# Patient Record
Sex: Female | Born: 1935 | Race: White | Hispanic: No | Smoking: Former smoker
Health system: Southern US, Community
[De-identification: ages and names within clinical notes are randomized; demographics above are authoritative.]

## PROBLEM LIST (undated history)

## (undated) DIAGNOSIS — K623 Rectal prolapse: Secondary | ICD-10-CM

## (undated) DIAGNOSIS — M1990 Unspecified osteoarthritis, unspecified site: Secondary | ICD-10-CM

## (undated) DIAGNOSIS — C7931 Secondary malignant neoplasm of brain: Secondary | ICD-10-CM

## (undated) HISTORY — PX: ABDOMINAL HYSTERECTOMY: SHX81

## (undated) HISTORY — PX: JOINT REPLACEMENT: SHX530

## (undated) HISTORY — PX: REPLACEMENT TOTAL KNEE: SUR1224

## (undated) HISTORY — PX: OTHER SURGICAL HISTORY: SHX169

---

## 1998-09-10 ENCOUNTER — Encounter: Payer: Self-pay | Admitting: *Deleted

## 1998-09-17 ENCOUNTER — Inpatient Hospital Stay (HOSPITAL_COMMUNITY): Admission: RE | Admit: 1998-09-17 | Discharge: 1998-09-21 | Payer: Self-pay | Admitting: *Deleted

## 1998-09-25 ENCOUNTER — Ambulatory Visit (HOSPITAL_COMMUNITY): Admission: RE | Admit: 1998-09-25 | Discharge: 1998-09-25 | Payer: Self-pay | Admitting: *Deleted

## 1998-10-02 ENCOUNTER — Encounter (HOSPITAL_COMMUNITY): Admission: RE | Admit: 1998-10-02 | Discharge: 1998-12-31 | Payer: Self-pay | Admitting: *Deleted

## 1999-02-28 ENCOUNTER — Emergency Department (HOSPITAL_COMMUNITY): Admission: EM | Admit: 1999-02-28 | Discharge: 1999-02-28 | Payer: Self-pay | Admitting: Emergency Medicine

## 2003-09-13 ENCOUNTER — Encounter: Payer: Self-pay | Admitting: Orthopedic Surgery

## 2003-09-18 ENCOUNTER — Inpatient Hospital Stay (HOSPITAL_COMMUNITY): Admission: RE | Admit: 2003-09-18 | Discharge: 2003-09-21 | Payer: Self-pay | Admitting: Orthopedic Surgery

## 2004-12-12 ENCOUNTER — Encounter: Admission: RE | Admit: 2004-12-12 | Discharge: 2004-12-12 | Payer: Self-pay | Admitting: Orthopedic Surgery

## 2004-12-12 IMAGING — NM NM BONE 3 PHASE
6 series · 6 of 6 positions shown · non-contrast
Comparison: none

CLINICAL DATA: Right knee pain.  Evaluate for loosening. 
 THREE PHASE BONE SCAN:
 The patient was injected with 24.3 mCi [I8] MDP intravenously, and three phase bone scan of the knees was performed.  On the flow phase activity is symmetrical.  On soft tissue phase mild activity is noted in both knees surrounding the prostheses but no focal abnormality is seen.  On delayed images there is fairly symmetrical activity, particularly on the tibial components of the prostheses but this is most consistent with normal activity.  No activity is seen surrounding either prosthesis to indicate loosening.

[st statics, dual detec · 2.36mm/px · 1 of 1 slices shown (1 of 3)]
[im 1/1]
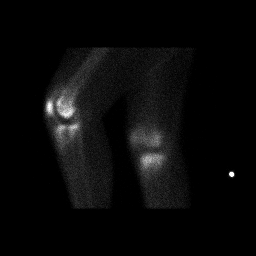

[bf bone flow · 2.33mm/px · 1 of 1 slices shown (1 of 3)]
[im 1/1]
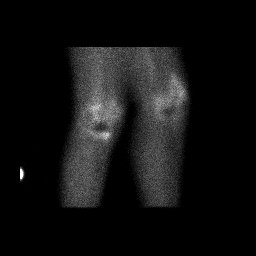

[bf bone flow · 2.36mm/px · 1 of 1 slices shown (2 of 3)]
[im 1/1]
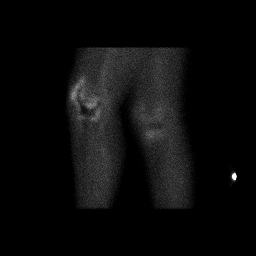

[bf bone flow · 2.36mm/px · 1 of 1 slices shown (3 of 3)]
[im 1/1]
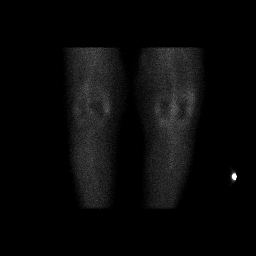

[st statics, dual detec · 2.36mm/px · 1 of 1 slices shown (2 of 3)]
[im 1/1]
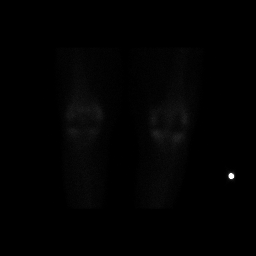

[st statics, dual detec · 2.33mm/px · 1 of 1 slices shown (3 of 3)]
[im 1/1]
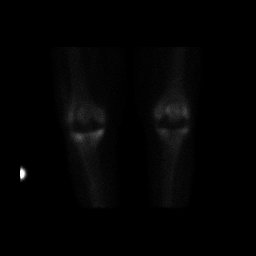

[6 of 6 positions shown; findings below may reference images not displayed]

IMPRESSION: Activity in the knees is within normal limits post bilateral total knee replacement.  There is no evidence of prosthetic loosening.

## 2008-01-26 ENCOUNTER — Encounter: Payer: Self-pay | Admitting: *Deleted

## 2008-01-26 DIAGNOSIS — Z9010 Acquired absence of unspecified breast and nipple: Secondary | ICD-10-CM | POA: Insufficient documentation

## 2008-01-26 DIAGNOSIS — Z9189 Other specified personal risk factors, not elsewhere classified: Secondary | ICD-10-CM | POA: Insufficient documentation

## 2008-01-26 DIAGNOSIS — K644 Residual hemorrhoidal skin tags: Secondary | ICD-10-CM | POA: Insufficient documentation

## 2008-01-26 DIAGNOSIS — C50919 Malignant neoplasm of unspecified site of unspecified female breast: Secondary | ICD-10-CM | POA: Insufficient documentation

## 2008-01-26 DIAGNOSIS — Z9089 Acquired absence of other organs: Secondary | ICD-10-CM

## 2008-01-26 DIAGNOSIS — D126 Benign neoplasm of colon, unspecified: Secondary | ICD-10-CM | POA: Insufficient documentation

## 2008-01-27 ENCOUNTER — Ambulatory Visit: Payer: Self-pay | Admitting: Internal Medicine

## 2011-04-11 NOTE — Discharge Summary (Signed)
NAME:  TRAMEKA, DOROUGH                    ACCOUNT NO.:  [id number]   MEDICAL RECORD NO.:  [id number]                   PATIENT TYPE:  INP   LOCATION:  0464                                 FACILITY:  [facility]   PHYSICIAN:  Ollen Gross, M.D.                 DATE OF BIRTH:  [birth year]   DATE OF ADMISSION:  09/18/2003  DATE OF DISCHARGE:  09/21/2003                                 DISCHARGE SUMMARY   ADMITTING DIAGNOSES:  1. Osteoarthritis left knee.  2. History of ulcers.  3. Non-insulin-dependent diabetes mellitus.  4. Postmenopausal.   DISCHARGE DIAGNOSES:  1. Osteoarthritis left knee status post left total knee arthroplasty.  2. Postoperative blood loss anemia.  3. Postoperative hyponatremia, improved.   PROCEDURE:  The patient was taken to the OR on September 18, 2003.  Left total  knee arthroplasty.  Surgeon Ollen Gross, M.D.  Assistant Alexzandrew L.  Julien Girt, P.A.  Anesthesia general.  Minimal blood loss.  Hemovac drain x1.  Tourniquet time 47 minutes at 300 mmHg.   CONSULTS:  None.   BRIEF HISTORY:  A 77 year old female well known to Ollen Gross, M.D.  having severe end-stage osteoarthritis of the left knee.  Pain has been  refractory to nonoperative management.  Significant deformity in the left  knee.  Presents now for total knee arthroplasty.   LABORATORY DATA:  CBC on admission showed hemoglobin of 14.4, hematocrit of  42.1, white cell count 4.8, red cell count 4.49.  Differential within normal  limits.  Postoperative H&H 11.6 and 33.3.  Last noted H&H 9.6 and 27.2.  PT/PTT on admission were 13.4 and 32, respectively with an INR of 1.0.  Serial pro times followed.  Last noted PT/INR 15.0 and 1.3.  Chemistry panel  on admission all within normal limits.  Serial BMETs were followed.  Sodium  dropped from 141 down to 131 back up to 134.  Glucose went up from 103 to  164, last noted at 175.  Urinalysis on admission:  Small leukocyte esterase,  rare epithelial  cells, 3-6 white cells, few bacteria.  Blood group type O+.  Chest x-ray dated September 13, 2003:  No evidence of acute disease.   HOSPITAL COURSE:  The patient admitted to [facility], taken to OR.  Underwent above stated procedure without complications.  The patient  tolerated procedure well.  Later returned to recovery room, then to  orthopedic floor for continued postoperative care.  Vital signs were  followed.  The patient was given 24 hours of postoperative IV antibiotics in  the form of Ancef.  Placed on Coumadin for DVT prophylaxis.  Placed  weightbearing as tolerated.  Given PC analgesic for pain control following  surgery.  Hemovac drain which was placed at time of surgery was pulled on  postoperative day one.  She had a Marcaine pain pump which was also placed  at time of surgery and was pulled on postoperative day two.  She did get out  of bed the first day and did well with her physical therapy.  She was  ambulating approximately 60 feet by day two and then 80 feet later that  afternoon and then it looks like 250 feet by day three.  She did extremely  well with physical therapy.  Dressing change initiated on postoperative day  two.  Incision was healing well.  PC and IVs were discontinued on day two.  She was doing so well by day three up ambulating 250 feet, moving her  bowels, tolerating her medications.  It was decided that patient would be  discharged home.   DISCHARGE PLAN:  Discharged home on September 21, 2003.   DISCHARGE DIAGNOSES:  Please see above.   DISCHARGE MEDICATIONS:  1. Coumadin.  2. Percocet.  3. Robaxin.   DIET:  Diabetic diet.   FOLLOWUP:  Two weeks.   ACTIVITY:  Weightbearing as tolerated.  Home health PT, home health nursing.  Total knee protocol.   DISPOSITION:  Home.   CONDITION ON DISCHARGE:  Improved.     Alexzandrew L. Julien Girt, P.A.              Ollen Gross, M.D.    ALP/MEDQ  D:  10/27/2003  T:  10/27/2003  Job:   [id number]   cc:   Lacretia Leigh. Quintella Reichert, M.D.

## 2011-04-11 NOTE — H&P (Signed)
NAME:  Peggy Howard, Peggy Howard                    ACCOUNT NO.:  [id number]   MEDICAL RECORD NO.:  [id number]                   PATIENT TYPE:  INP   LOCATION:  0464                                 FACILITY:  [facility]   PHYSICIAN:  Ollen Gross, M.D.                 DATE OF BIRTH:  04/22/1933   DATE OF ADMISSION:  09/18/2003  DATE OF DISCHARGE:  09/21/2003                                HISTORY & PHYSICAL   CHIEF COMPLAINT:  Left knee pain.   HISTORY OF PRESENT ILLNESS:  The patient is a 76 year old female seen by Dr.  Lequita Halt for ongoing left knee pain. She has been seen for knee pain that has  been ongoing for several years now. It has progressively gotten worse. It  hurts her all the time. She has even been having pain at night and it  started to interfere with her daily activities. She is seen in the office.  Her x-rays show significant end stage arthritis of the left knee, multi-  compartmental. It is felt that she has reached a point where she could  benefit from undergoing total knee replacement. Risks and benefits of  procedure have been discussed with the patient. She elected to proceed with  surgery.   ALLERGIES:  No known drug allergies.   CURRENT MEDICATIONS:  None.   PAST MEDICAL HISTORY:  History of ulcers. Newly diagnosed noninsulin-  dependent diabetes mellitus. Post-menopausal.   PAST SURGICAL HISTORY:  Hysterectomy, polypectomy, appendectomy,  tonsillectomy.   SOCIAL HISTORY:  Married. She works as a Animator. Non-smoker and no  alcohol. Four children. Her husband will be assisting with her care after  surgery.   FAMILY HISTORY:  Significant with mother deceased age 20 with history of  myocardial infarction. Father deceased in his 10's. A war casualty in 1932.   REVIEW OF SYSTEMS:  GENERAL:  No fever, chills, or night sweats. NEUROLOGIC:  No seizures, syncope, or paralysis. RESPIRATORY: No shortness of breath,  productive cough, or hemoptysis.  CARDIOVASCULAR:  No chest pain, angina, or  orthopnea. GASTROINTESTINAL:  No nausea, vomiting, diarrhea or constipation.  GENITOURINARY:  No dysuria, hematuria or discharge. MUSCULOSKELETAL:  Pertinent to that of the left knee found in the history of present illness.   PHYSICAL EXAMINATION:  VITAL SIGNS:  Pulse 60, respiratory rate 12, blood  pressure 144/84.  GENERAL:  The patient is a 77 year old white female, well developed, well  nourished and in no acute distress. Alert and oriented and cooperative.  HEENT:  Normocephalic and atraumatic. Pupils are equal, round, and reactive.  Extraocular muscles intact. The patient does have upper dentures and lower  partial dentures.  NECK:  Supple. No carotid bruits.  CHEST:  Left posterior base crackles. Otherwise pulmonary fields are clear.  The crackles do improve with cough.  HEART:  Regular rate and rhythm. No murmurs.  ABDOMEN:  Soft and nontender. Bowel sounds are present.  RECTAL/BREAST/GENITALIA:  Not done.  Not pertinent to present illness.  EXTREMITIES:  Significant to the left knee. She is tender to palpation  throughout the left knee. There is moderate crepitus noted on passive range  of motion. Range of motion of 5 to 105 degrees. No swelling.   IMPRESSION:  1. Osteoarthritis, left knee.  2. History of ulcers.  3. Noninsulin-dependent diabetes mellitus.  4. Post menopausal.   PLAN:  The patient admitted to [facility] to undergo a  left total knee arthroplasty. The surgery will be performed by Dr. Ollen Gross.     Alexzandrew L. Julien Girt, P.A.              Ollen Gross, M.D.    ALP/MEDQ  D:  09/25/2003  T:  09/25/2003  Job:  [id number]   cc:   Lacretia Leigh. Quintella Reichert, M.D.   Ollen Gross, M.D.  [address]  [place]  [place] [address]  Fax: [phone]

## 2011-04-11 NOTE — Op Note (Signed)
NAME:  Peggy Howard, Peggy Howard                    ACCOUNT NO.:  [id number]   MEDICAL RECORD NO.:  [id number]                   PATIENT TYPE:  INP   LOCATION:  X005                                 FACILITY:  [facility]   PHYSICIAN:  Ollen Gross, M.D.                 DATE OF BIRTH:  [birth year]   DATE OF PROCEDURE:  09/18/2003  DATE OF DISCHARGE:                                 OPERATIVE REPORT   PREOPERATIVE DIAGNOSIS:  Osteoarthritis of the left knee.   POSTOPERATIVE DIAGNOSES:  Osteoarthritis of the left knee.   OPERATION/PROCEDURE:  Left total knee arthroplasty.   SURGEON:  Ollen Gross, M.D.   ASSISTANT:  Alexzandrew L. Perkins, P.A.-C.   ANESTHESIA:  General.   ESTIMATED BLOOD LOSS:  Minimal.   DRAINS:  Hemovac x1.   COMPLICATIONS:  None.   TOURNIQUET TIME:  47 minutes at 300 mmHg.   DISPOSITION:  Stable to recovery.   BRIEF CLINICAL NOTE:  Mrs. Peggy Howard is a 77 year old female with severe end-  stage osteoarthritis of the left knee with pain refractory to nonoperative  management.  She has significant deformity of the left knee and presents now  for left total knee arthroplasty.   DESCRIPTION OF PROCEDURE:  After the successful administration of general  anesthesia, tourniquet was placed on the left thigh and left lower extremity  prepped and draped in the usual sterile fashion.  The extremity was wrapped  in Esmarch, knee flexed, tourniquet inflated to 300 mmHg.  Standard midline  incision was made with a 10-blade through the subcutaneous tissue to the  level of the extensor mechanism.  A fresh blade was used to make a medial  parapatellar arthrotomy and the soft tissue and proximal lateral tibia  subperiosteally elevated to the joint line with the knife, then the  semimembranosus with the curved osteotome.  Soft tissue of the proximal and  lateral tibia was also elevated with attention being paid to avoid the  patellar tendon on the tibial tubercle.  Patella was then  everted laterally,  knee flexed and the ACL and PCL removed.  Drill was used to create a  starting hole at the distal femur.  The canal was irrigated.  A five-degree  left valgus alignment guide was placed.  Referencing at both the posterior  condyles, location was marked and the block pins were moved about 10 mm off  the distal femur.  Distal femoral resection was made with an oscillating  saw.  Sizing block was placed and size 3 was the most appropriate.  Rotation  was marked off the epicondylar axis.  Size 3 cutting block is placed and the  anterior and posterior cuts made.   Tibia was subluxed forward and the menisci removed.  Extra medullary tibial  alignment guide placed surfacing proximally to the medial aspect of the  tibial tubercle and distally along the second metatarsal axis and tibial  crest.  Blocks pinned to remove  10 mm of the nondeficient lateral side.  Tibial resection is made with an oscillating saw.  A size 3 is most  appropriate and the proximal tibia is prepared with a modular drill and keel  punch.  Femoral preparation is then completed with the intercondylar and  chamfer cuts.   Trial size 3 posterior stabilizing femur, size 3 mobile bearing tibia and 10  mm posterior stabilizing rotating platform insert trial placed.  Full  extension was achieved with excellent varus and valgus balance throughout.  The patella thickness is measured to be 23 mm, ___________ 13 mm.  A 38  template is placed.  Local holes are drilled, trial patellas are placed and  it tracks normally.  The osteophytes were then removed off the posterior  femur with the trial in place.   All trials were then removed and the cut bone surfaces prepared with  pulsatile lavage.  Cement is mixed and once ready for implantation, the size  3 posterior stabilized femur, size 3 mobile bearing tibia, and 38 patella is  cemented into place.  Patella is held with the clamp.  A 10 mm trial insert  is placed and  knee held in full extension, all extruded cement removed.  Once the cement was fully hardened, and the permanent 10 mm posterior  stabilizing rotating platform insert is placed into the tibial tray.  The  knee is reduced and has excellent stability with excellent balance from full  extension down past 130 degrees of flexion.  The wounds were then copiously  irrigated with antibiotic solution and the extensor mechanism closed over a  Hemovac drain with interrupted #1 PDS.  The tourniquet released for a total  time of 47 minutes.  Flexion is against gravity was 125 degrees.  Subcutaneous tissues were then closed with interrupted 2-0 Vicryl,  subcuticular running 4-0 Monocryl.  The catheter with a Marcaine pain pump  and then a bulky sterile dressing applied.  She was then awakened and  transported to the recovery room in stable condition.                                               Ollen Gross, M.D.    FA/MEDQ  D:  09/18/2003  T:  09/18/2003  Job:  [id number]

## 2017-06-17 ENCOUNTER — Emergency Department (HOSPITAL_COMMUNITY): Payer: Medicare Other

## 2017-06-17 ENCOUNTER — Observation Stay (HOSPITAL_COMMUNITY)
Admission: EM | Admit: 2017-06-17 | Discharge: 2017-06-18 | Disposition: A | Discharge status: Home / Self Care | Payer: Medicare Other | Attending: Family Medicine | Admitting: Family Medicine

## 2017-06-17 ENCOUNTER — Encounter (HOSPITAL_COMMUNITY): Payer: Self-pay | Admitting: Emergency Medicine

## 2017-06-17 DIAGNOSIS — J9601 Acute respiratory failure with hypoxia: Secondary | ICD-10-CM | POA: Diagnosis not present

## 2017-06-17 DIAGNOSIS — Z96653 Presence of artificial knee joint, bilateral: Secondary | ICD-10-CM | POA: Diagnosis not present

## 2017-06-17 DIAGNOSIS — J181 Lobar pneumonia, unspecified organism: Secondary | ICD-10-CM

## 2017-06-17 DIAGNOSIS — J189 Pneumonia, unspecified organism: Principal | ICD-10-CM | POA: Insufficient documentation

## 2017-06-17 DIAGNOSIS — R05 Cough: Secondary | ICD-10-CM | POA: Diagnosis present

## 2017-06-17 DIAGNOSIS — Z9011 Acquired absence of right breast and nipple: Secondary | ICD-10-CM | POA: Diagnosis not present

## 2017-06-17 DIAGNOSIS — Z87891 Personal history of nicotine dependence: Secondary | ICD-10-CM | POA: Insufficient documentation

## 2017-06-17 DIAGNOSIS — R0902 Hypoxemia: Secondary | ICD-10-CM

## 2017-06-17 LAB — BASIC METABOLIC PANEL
Anion gap: 10 (ref 5–15)
BUN: 13 mg/dL (ref 6–20)
CALCIUM: 8.8 mg/dL — AB (ref 8.9–10.3)
CO2: 29 mmol/L (ref 22–32)
CREATININE: 0.79 mg/dL (ref 0.44–1.00)
Chloride: 99 mmol/L — ABNORMAL LOW (ref 101–111)
Glucose, Bld: 202 mg/dL — ABNORMAL HIGH (ref 65–99)
Potassium: 4 mmol/L (ref 3.5–5.1)
SODIUM: 138 mmol/L (ref 135–145)

## 2017-06-17 LAB — CBC WITH DIFFERENTIAL/PLATELET
BASOS ABS: 0 10*3/uL (ref 0.0–0.1)
Basophils Relative: 0 %
EOS ABS: 0.1 10*3/uL (ref 0.0–0.7)
Eosinophils Relative: 1 %
HCT: 40.3 % (ref 36.0–46.0)
HEMOGLOBIN: 13.5 g/dL (ref 12.0–15.0)
LYMPHS ABS: 2.6 10*3/uL (ref 0.7–4.0)
LYMPHS PCT: 27 %
MCH: 32.1 pg (ref 26.0–34.0)
MCHC: 33.5 g/dL (ref 30.0–36.0)
MCV: 96 fL (ref 78.0–100.0)
MONO ABS: 0.8 10*3/uL (ref 0.1–1.0)
Monocytes Relative: 8 %
Neutro Abs: 6.3 10*3/uL (ref 1.7–7.7)
Neutrophils Relative %: 64 %
PLATELETS: 370 10*3/uL (ref 150–400)
RBC: 4.2 MIL/uL (ref 3.87–5.11)
RDW: 13.2 % (ref 11.5–15.5)
WBC: 9.8 10*3/uL (ref 4.0–10.5)

## 2017-06-17 LAB — CG4 I-STAT (LACTIC ACID): LACTIC ACID, VENOUS: 1.88 mmol/L (ref 0.5–1.9)

## 2017-06-17 IMAGING — CR DG CHEST 2V
2 series · 2 of 2 positions shown · non-contrast
Comparison: None.

CLINICAL DATA: Productive cough.

EXAM:
CHEST  2 VIEW

[w chest pa]
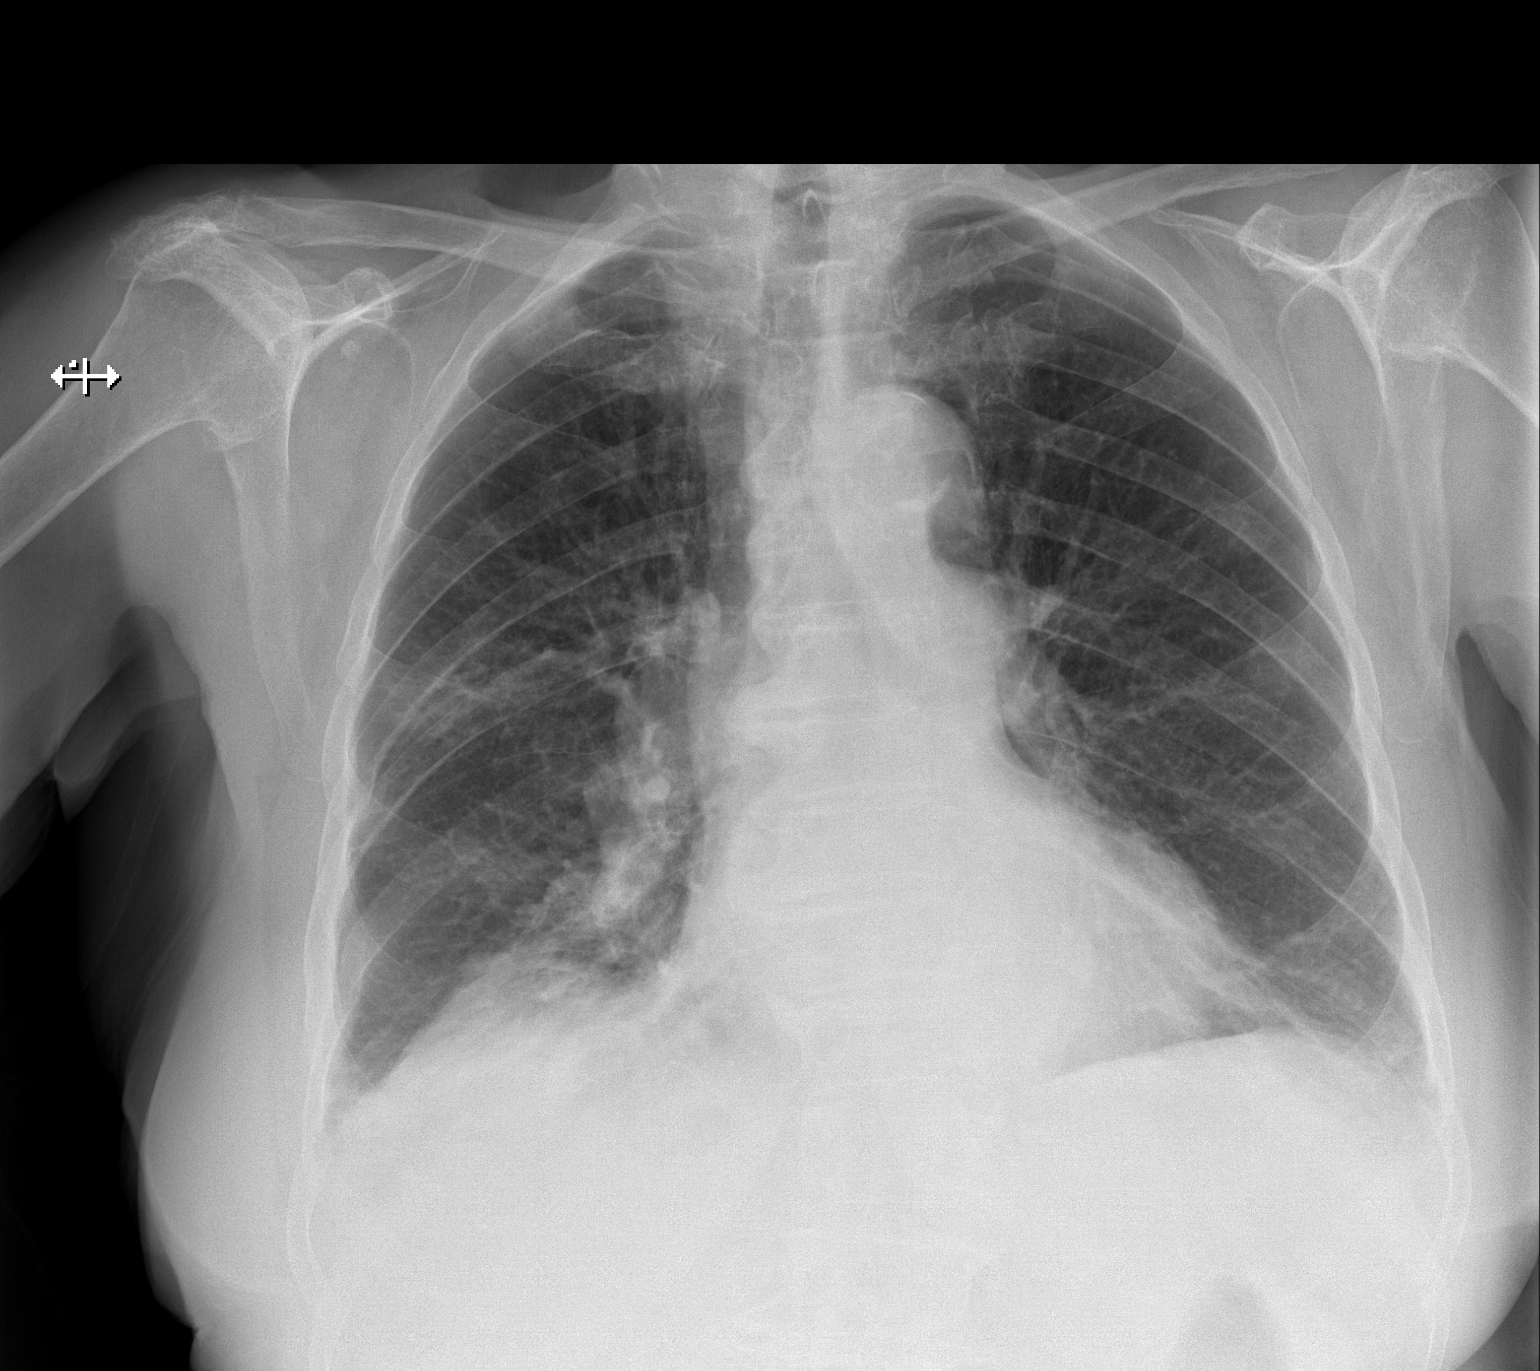

[w chest lat]
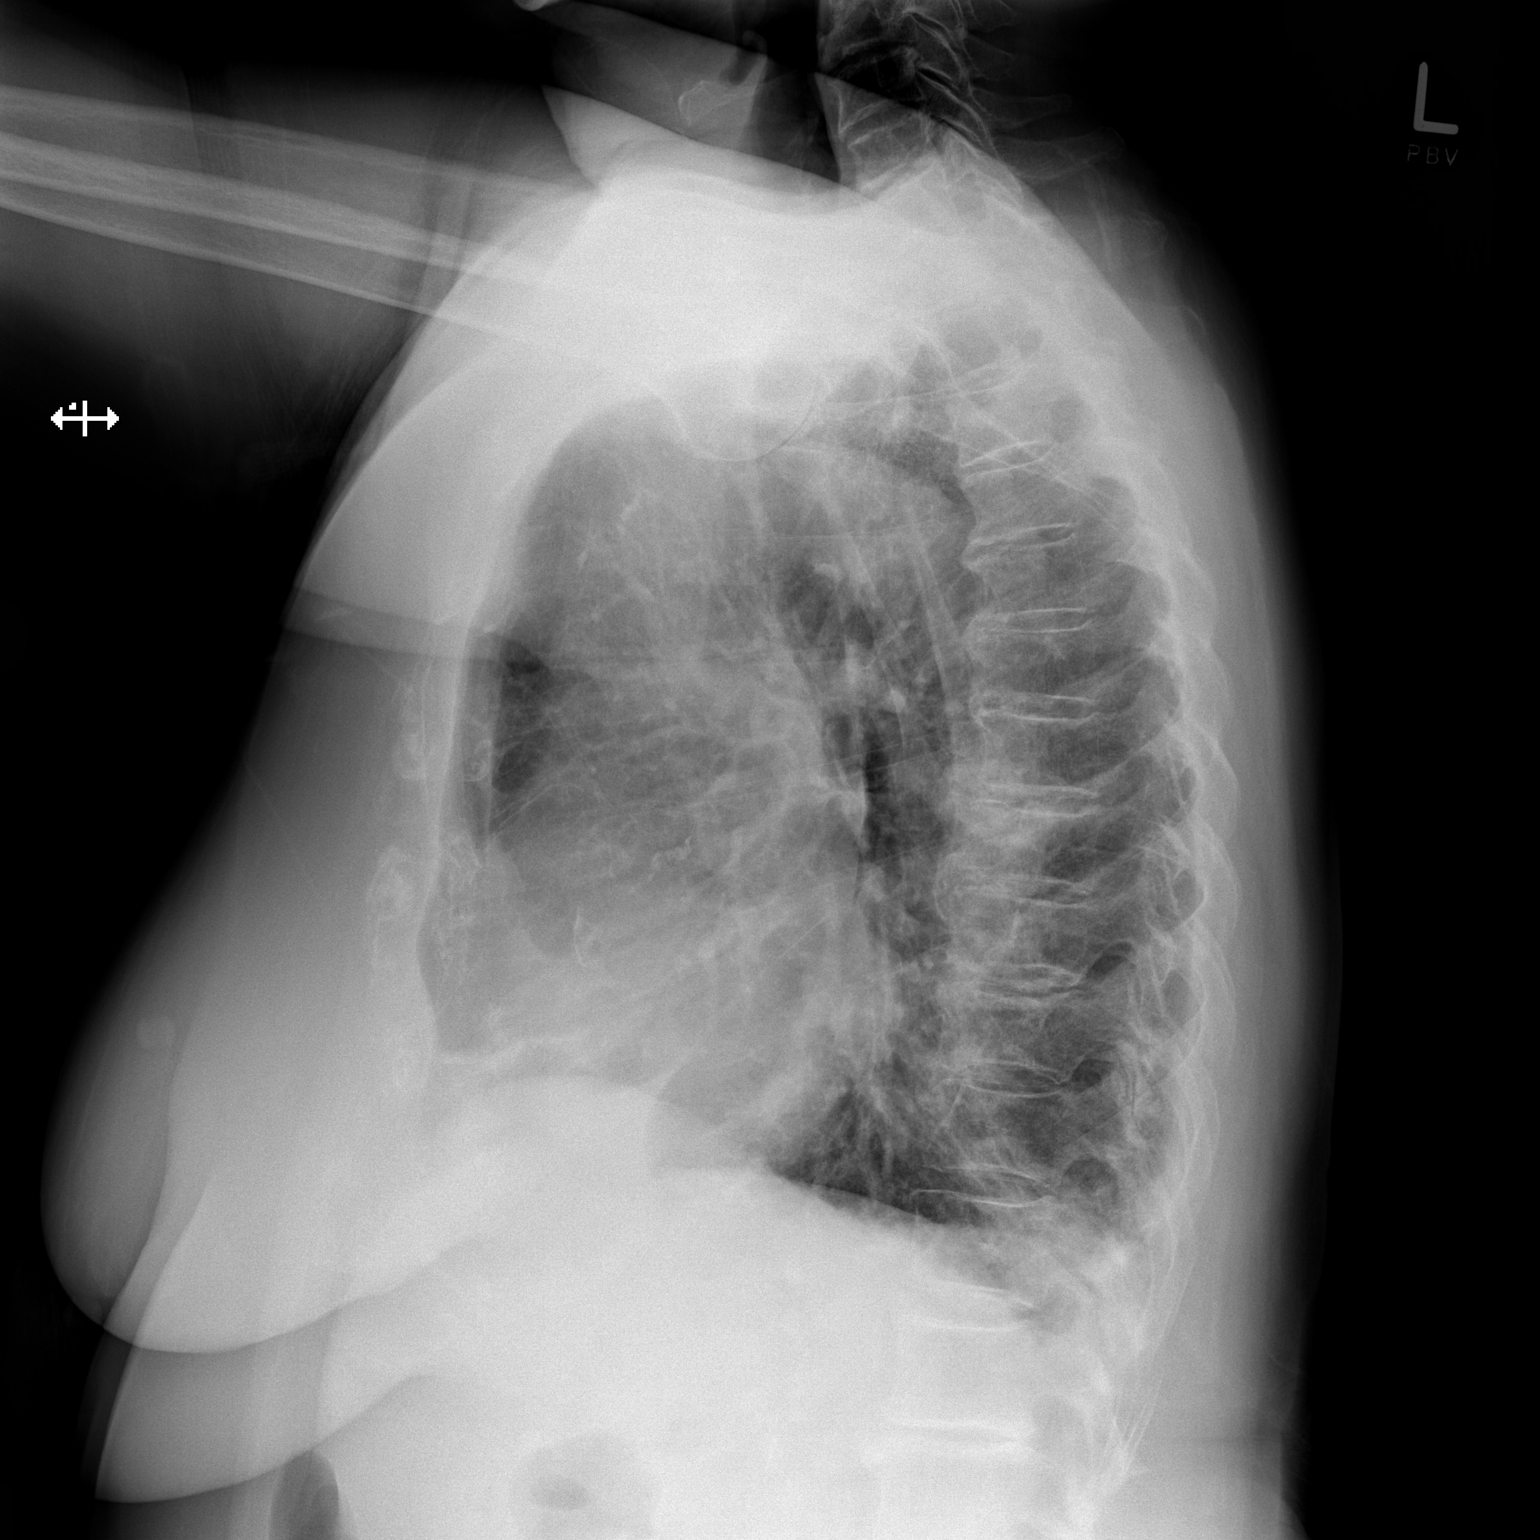

[2 of 2 positions shown; findings below may reference images not displayed]

FINDINGS: The heart size and mediastinal contours are within normal limits.
Atherosclerosis of thoracic aorta is noted. No pneumothorax or
pleural effusion is noted. Mild bilateral basilar opacities are
noted concerning for atelectasis or possibly infiltrates. Right
midlung opacity is noted concerning for possible pneumonia. The
visualized skeletal structures are unremarkable.
IMPRESSION: Possible right midlung pneumonia. Mild bilateral basilar opacities
concerning for atelectasis or possibly infiltrates. Followup PA and
lateral chest X-ray is recommended in 3-4 weeks following trial of
antibiotic therapy to ensure resolution and exclude underlying
malignancy.

## 2017-06-17 MED ORDER — AZITHROMYCIN 500 MG IV SOLR
500.0000 mg | Freq: Once | INTRAVENOUS | Status: AC
  Administered 2017-06-18: 500 mg via INTRAVENOUS
  Filled 2017-06-17: qty 500

## 2017-06-17 MED ORDER — DEXTROSE 5 % IV SOLN
1.0000 g | Freq: Once | INTRAVENOUS | Status: AC
  Administered 2017-06-17: 1 g via INTRAVENOUS
  Filled 2017-06-17: qty 10

## 2017-06-17 NOTE — ED Provider Notes (Signed)
[facility] DEPT Provider Note   CSN: [id number] Arrival date & time: 06/17/17  1613     History   Chief Complaint Chief Complaint  Patient presents with  . Cough    HPI Kynzie Alinger Hada is a 83 y.o. female.  The history is provided by the patient and medical records.  Cough  This is a new problem. The current episode started more than 2 days ago. The problem occurs constantly. The problem has not changed since onset.The cough is productive of sputum. There has been no fever. Associated symptoms include shortness of breath. Pertinent negatives include no chest pain, no chills, no headaches, no rhinorrhea, no sore throat, no myalgias and no wheezing. She has tried nothing for the symptoms. She is not a smoker.    History reviewed. No pertinent past medical history.  Patient Active Problem List   Diagnosis Date Noted  . ADENOCARCINOMA, BREAST, RIGHT 01/26/2008  . POLYP, COLON 01/26/2008  . HEMORRHOIDS, HX OF 01/26/2008  . POLYPECTOMY, HX OF 01/26/2008  . MASTECTOMY, RIGHT, HX OF 01/26/2008  . 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 REPLACEMENT TOTAL KNEE Bilateral     OB History    No data available       Home Medications    Prior to Admission medications   Not on File    Family History History reviewed. No pertinent family history.  Social History Social History  Substance Use Topics  . Smoking status: Not on file  . Smokeless tobacco: Not on file  . Alcohol use Not on file     Allergies   Sulfamethoxazole-trimethoprim   Review of Systems Review of Systems  Constitutional: Negative for chills, fatigue and fever.  HENT: Negative for congestion, rhinorrhea and sore throat.   Eyes: Negative for visual disturbance.  Respiratory: Positive for cough and shortness of breath. Negative for chest tightness, wheezing and stridor.   Cardiovascular: Negative for chest pain, palpitations and  leg swelling.  Gastrointestinal: Negative for abdominal distention, constipation, diarrhea, nausea and vomiting.  Genitourinary: Negative for dysuria, flank pain, frequency and hematuria.  Musculoskeletal: Negative for back pain, myalgias, neck pain and neck stiffness.  Skin: Negative for rash and wound.  Neurological: Negative for seizures, light-headedness and headaches.  Psychiatric/Behavioral: Negative for agitation and confusion.  All other systems reviewed and are negative.    Physical Exam Updated Vital Signs BP (!) 166/80 (BP Location: Left Arm)   Pulse 98   Temp 97.8 F (36.6 C) (Oral)   Resp 18   Ht 5\' 5"  (1.651 m)   Wt 70.9 kg (156 lb 6.4 oz)   SpO2 90%   BMI 26.03 kg/m   Physical Exam  Constitutional: She appears well-developed and well-nourished. No distress.  HENT:  Head: Normocephalic and atraumatic.  Mouth/Throat: Oropharynx is clear and moist. No oropharyngeal exudate.  Eyes: Pupils are equal, round, and reactive to light. Conjunctivae are normal.  Neck: Normal range of motion. Neck supple.  Cardiovascular: Normal rate and regular rhythm.   No murmur heard. Pulmonary/Chest: No stridor. No respiratory distress. She has no wheezes. She has rhonchi in the right upper field, the right middle field and the right lower field. She exhibits no tenderness.  Abdominal: Soft. There is no tenderness.  Musculoskeletal: She exhibits no edema.  Neurological: She is alert. No sensory deficit. She exhibits normal muscle tone.  Skin: Skin is warm and dry. Capillary refill takes less than 2 seconds.  No rash noted. She is not diaphoretic.  Psychiatric: She has a normal mood and affect.  Nursing note and vitals reviewed.    ED Treatments / Results  Labs (all labs ordered are listed, but only abnormal results are displayed) Labs Reviewed  BASIC METABOLIC PANEL - Abnormal; Notable for the following:       Result Value   Chloride 99 (*)    Glucose, Bld 202 (*)    Calcium  8.8 (*)    All other components within normal limits  CULTURE, BLOOD (ROUTINE X 2)  CULTURE, BLOOD (ROUTINE X 2)  CULTURE, EXPECTORATED SPUTUM-ASSESSMENT  GRAM STAIN  CBC WITH DIFFERENTIAL/PLATELET  HIV ANTIBODY (ROUTINE TESTING)  STREP PNEUMONIAE URINARY ANTIGEN  LEGIONELLA PNEUMOPHILA SEROGP 1 UR AG  COMPREHENSIVE METABOLIC PANEL  CBC WITH DIFFERENTIAL/PLATELET  I-STAT CG4 LACTIC ACID, ED  CG4 I-STAT (LACTIC ACID)    EKG  EKG Interpretation None       Radiology Dg Chest 2 View  Result Date: 06/17/2017 CLINICAL DATA:  Productive cough. EXAM: CHEST  2 VIEW COMPARISON:  None. FINDINGS: The heart size and mediastinal contours are within normal limits. Atherosclerosis of thoracic aorta is noted. No pneumothorax or pleural effusion is noted. Mild bilateral basilar opacities are noted concerning for atelectasis or possibly infiltrates. Right midlung opacity is noted concerning for possible pneumonia. The visualized skeletal structures are unremarkable. IMPRESSION: Possible right midlung pneumonia. Mild bilateral basilar opacities concerning for atelectasis or possibly infiltrates. Followup PA and lateral chest X-ray is recommended in 3-4 weeks following trial of antibiotic therapy to ensure resolution and exclude underlying malignancy. Electronically Signed   By: Marijo Conception, M.D.   On: 06/17/2017 17:24    Procedures Procedures (including critical care time)  Medications Ordered in ED Medications  cefTRIAXone (ROCEPHIN) 1 g in dextrose 5 % 50 mL IVPB (not administered)  azithromycin (ZITHROMAX) 500 mg in dextrose 5 % 250 mL IVPB (not administered)     Initial Impression / Assessment and Plan / ED Course  I have reviewed the triage vital signs and the nursing notes.  Pertinent labs & imaging results that were available during my care of the patient were reviewed by me and considered in my medical decision making (see chart for details).     Sweetie Alinger Benda is a 83  y.o. female with no significant past medical history on no home medications who presents with 1 week of productive cough. Patient denies fevers, chills, chest pain, palpitations, lightheadedness, nausea, vomiting, conservation, diarrhea, or dysuria. She denies recent traumas. She does report some mild shortness of breath with her coughing fits. She reports some bilateral flank pain when she is coughing heavily. She denies hemoptysis. She denies leg swelling or pain. She denies history of DVT or PE. She denies any cardiac history.  On arrival, patient found to be hypoxic in the 80s. Patient was placed on 2 L nasal cannula with improvement in oxygen saturations.   On exam, patient has coarse breath sounds on the right side greater than left. Patient's chest and back were nontender. No abdominal tenderness. No focal neurologic deficits.  Patient had x-ray showing concern for pneumonia. Laboratory testing was performed showing nonelevated lactic acid. No leukocytosis and normal creatinine present.   Given the evidence of kidney associated pneumonia, blood cultures were obtained and antibiotics were ordered. Rocephin and azithromycin were given.  Patient wanted to be discharged tonight and was observed for a period of time to see if hypoxia improved. Patient was taken  off of oxygen and reassessed. While sitting comfortably in her bed, patient continued to have oxygen saturation of 82 witnessed by me. Patient continued to have coughing fits.   Given the patient's hypoxia, do not feel patient is safe for discharge home. Patient will be called for admission to the hospitalist team for Community associated pneumonia with hypoxia.   Final Clinical Impressions(s) / ED Diagnoses   Final diagnoses:  Community acquired pneumonia of right middle lobe of lung ([place])  Hypoxia     Clinical Impression: 1. Community acquired pneumonia of right middle lobe of lung ([place])   2. Hypoxia     Disposition: Admit to  hospitalist service    Adelheid Hoggard, Gwenyth Allegra, MD 06/18/17 1150

## 2017-06-17 NOTE — ED Triage Notes (Signed)
Pt states that she has had a productive cough with gray sputum x 1 week. Alert and oriented.

## 2017-06-18 ENCOUNTER — Encounter (HOSPITAL_COMMUNITY): Payer: Self-pay

## 2017-06-18 DIAGNOSIS — J189 Pneumonia, unspecified organism: Secondary | ICD-10-CM | POA: Diagnosis not present

## 2017-06-18 DIAGNOSIS — J9601 Acute respiratory failure with hypoxia: Secondary | ICD-10-CM

## 2017-06-18 DIAGNOSIS — R0902 Hypoxemia: Secondary | ICD-10-CM | POA: Diagnosis not present

## 2017-06-18 DIAGNOSIS — J181 Lobar pneumonia, unspecified organism: Secondary | ICD-10-CM | POA: Diagnosis not present

## 2017-06-18 LAB — CBC WITH DIFFERENTIAL/PLATELET
Basophils Absolute: 0.1 10*3/uL (ref 0.0–0.1)
Basophils Relative: 1 %
Eosinophils Absolute: 0.2 10*3/uL (ref 0.0–0.7)
Eosinophils Relative: 2 %
HCT: 37.2 % (ref 36.0–46.0)
Hemoglobin: 12.7 g/dL (ref 12.0–15.0)
Lymphocytes Relative: 22 %
Lymphs Abs: 2.2 10*3/uL (ref 0.7–4.0)
MCH: 32.2 pg (ref 26.0–34.0)
MCHC: 34.1 g/dL (ref 30.0–36.0)
MCV: 94.2 fL (ref 78.0–100.0)
Monocytes Absolute: 0.9 10*3/uL (ref 0.1–1.0)
Monocytes Relative: 9 %
Neutro Abs: 6.7 10*3/uL (ref 1.7–7.7)
Neutrophils Relative %: 66 %
Platelets: 317 10*3/uL (ref 150–400)
RBC: 3.95 MIL/uL (ref 3.87–5.11)
RDW: 13 % (ref 11.5–15.5)
WBC: 10.1 10*3/uL (ref 4.0–10.5)

## 2017-06-18 LAB — COMPREHENSIVE METABOLIC PANEL
ALT: 47 U/L (ref 14–54)
AST: 43 U/L — AB (ref 15–41)
Albumin: 2.7 g/dL — ABNORMAL LOW (ref 3.5–5.0)
Alkaline Phosphatase: 60 U/L (ref 38–126)
Anion gap: 7 (ref 5–15)
BILIRUBIN TOTAL: 0.6 mg/dL (ref 0.3–1.2)
BUN: 11 mg/dL (ref 6–20)
CALCIUM: 8.6 mg/dL — AB (ref 8.9–10.3)
CO2: 31 mmol/L (ref 22–32)
CREATININE: 0.7 mg/dL (ref 0.44–1.00)
Chloride: 102 mmol/L (ref 101–111)
GFR calc Af Amer: >60 mL/min (ref >60)
GFR calc non Af Amer: >60 mL/min (ref >60)
GLUCOSE: 131 mg/dL — AB (ref 65–99)
Potassium: 4.4 mmol/L (ref 3.5–5.1)
Sodium: 140 mmol/L (ref 135–145)
TOTAL PROTEIN: 6.3 g/dL — AB (ref 6.5–8.1)

## 2017-06-18 LAB — HIV ANTIBODY (ROUTINE TESTING W REFLEX): HIV SCREEN 4TH GENERATION: NONREACTIVE

## 2017-06-18 LAB — CG4 I-STAT (LACTIC ACID): Lactic Acid, Venous: 1.21 mmol/L (ref 0.5–1.9)

## 2017-06-18 MED ORDER — DM-GUAIFENESIN ER 30-600 MG PO TB12: 1.0000 | ORAL_TABLET | Freq: Two times a day (BID) | ORAL | 0 refills | Status: DC

## 2017-06-18 MED ORDER — ENOXAPARIN SODIUM 40 MG/0.4ML ~~LOC~~ SOLN
40.0000 mg | Freq: Every day | SUBCUTANEOUS | Status: DC
  Administered 2017-06-18: 40 mg via SUBCUTANEOUS
  Filled 2017-06-18: qty 0.4

## 2017-06-18 MED ORDER — DM-GUAIFENESIN ER 30-600 MG PO TB12
1.0000 | ORAL_TABLET | Freq: Two times a day (BID) | ORAL | Status: DC
  Administered 2017-06-18: 1 via ORAL
  Filled 2017-06-18: qty 1

## 2017-06-18 MED ORDER — LEVOFLOXACIN 500 MG PO TABS: 500.0000 mg | ORAL_TABLET | Freq: Every day | ORAL | 0 refills | Status: AC

## 2017-06-18 MED ORDER — BENZONATATE 100 MG PO CAPS
100.0000 mg | ORAL_CAPSULE | Freq: Three times a day (TID) | ORAL | Status: DC | PRN
  Administered 2017-06-18: 100 mg via ORAL
  Filled 2017-06-18: qty 1

## 2017-06-18 MED ORDER — AZITHROMYCIN 250 MG PO TABS: 500.0000 mg | ORAL_TABLET | Freq: Every day | ORAL | Status: DC

## 2017-06-18 MED ORDER — BENZONATATE 100 MG PO CAPS: 100.0000 mg | ORAL_CAPSULE | Freq: Three times a day (TID) | ORAL | 0 refills | Status: DC | PRN

## 2017-06-18 MED ORDER — DEXTROSE 5 % IV SOLN: 1.0000 g | INTRAVENOUS | Status: DC

## 2017-06-18 NOTE — Progress Notes (Signed)
Date: June 18, 2017 Chart reviewed for discharge orders: Information concerning the health and wellness clinic given to patient. Vernia Buff, [phone]

## 2017-06-18 NOTE — Progress Notes (Signed)
Md notified that pt walked in hall 360 ft with NT. O2 sats monitored and maintained at 92-93% on RA while ambulating for duration of walk. Pt tolerated well. See new orders entered into EPIC by MD.

## 2017-06-18 NOTE — Progress Notes (Signed)
Pharmacy Antibiotic Note  Peggy Howard is a 83 y.o. female admitted on 06/17/2017 with CAP.  Pharmacy has been consulted for Rocephin dosing.  Plan: Rocephin 1 gm IV q24 Pharmacy to sign off thanks  Height: 5\' 5"  (165.1 cm) Weight: 156 lb 6.4 oz (70.9 kg) IBW/kg (Calculated) : 57  Temp (24hrs), Avg:97.8 F (36.6 C), Min:97.8 F (36.6 C), Max:97.8 F (36.6 C)   Recent Labs Lab 06/17/17 2230 06/17/17 2239  WBC 9.8  --   CREATININE 0.79  --   LATICACIDVEN  --  1.88    Estimated Creatinine Clearance: 54.5 mL/min (by C-G formula based on SCr of 0.79 mg/dL).    Allergies  Allergen Reactions  . Sulfamethoxazole-Trimethoprim     REACTION: causes nausea sweats   Eudelia Bunch, Pharm.D. [phone] 06/18/2017 12:36 AM

## 2017-06-18 NOTE — Progress Notes (Signed)
PROGRESS NOTE  Subjective: Peggy Howard is an 83 y.o. female with no PCP or known medical history who presented to the ED for 1 week of worsening productive cough found to have RML pneumonia on CXR and hypoxemia. She has ~48 pack-year smoking history remotely, quit 40 years ago. She was 83% SpO2 on room air in the ED prompting admission early this morning.   Objective: BP (!) 148/85 (BP Location: Right Arm)   Pulse (!) 103   Temp 98.6 F (37 C) (Oral)   Resp 18   Ht 5\' 5"  (1.651 m)   Wt 80.3 kg (177 lb 1.6 oz)   SpO2 97%   BMI 29.47 kg/m   Gen: No distress.  Pulm: Decreased at bilateral bases, labored with longer sentences. No wheezing or rales.   CV: Regular tachycardia GI: Soft, NT, ND, +BS  Neuro: Alert and oriented. No focal deficits. Skin: No rashes, lesions no ulcers  Assessment & Plan: 83yo female with significant tobacco use history not on any medications admitted early this AM for acute hypoxemic respiratory failure due to RML pneumonia.  - Continue supplemental oxygen as needed to maintain SpO2 >90%. Respiratory status not currently at baseline.  - Continue empiric ceftriaxone, azithromycin - Follow cultures and Ag's - CM consulted to assist with establishing care with PCP near her home in [place] (lives with Son).   Vance Gather, MD [facility] Pager [phone] 06/18/2017, 9:39 AM

## 2017-06-18 NOTE — Discharge Summary (Signed)
Physician Discharge Summary  Peggy Howard HFW:[id number] DOB: [birth year] DOA: 06/17/2017  PCP: Neena Rhymes, MD  Admit date: 06/17/2017 Discharge date: 06/18/2017  Admitted From: Home Disposition: Home   Recommendations for Outpatient Follow-up:  1. Follow up with PCP in 1-2 weeks 2. Followup 2v CXR recommended in 4 weeks to ensure resolution of RML infiltrate/exclude underlying malignancy.  Home Health: None Equipment/Devices: None Discharge Condition: Stable CODE STATUS: Full Diet recommendation: As tolerated  Brief/Interim Summary: Pt is a 83 yo female with no significant pmh who came with cc of cough for the past week, productive of sputum w/o blood with no chest pain, fever/chills, N/V/D/C/abd pain or dysuria. No other complaints. Pt wanted to go home but O2 sat was 83 on RA. No preceding URI symptoms. No asthma. Hx of smoking for 20+ years over 30 years ago. She was given antibiotics with rapid improvement, weaned from oxygen the following morning, taking good po and discharged on oral antibiotics.   Discharge Diagnoses:  Active Problems:   Pneumonia  Acute hypoxic respiratory failure due to RML pneumonia:  - Resolved with antibiotic therapy.  - Continue po antibiotics - Cultures negative at discharge.  Discharge Instructions Discharge Instructions    Discharge instructions    Complete by:  As directed    You were admitted for observation due to an oxygen requirement with pneumonia. Fortunately, it seems you have improved with antibiotics, which you will continue to take. You are stable for discharge with the following recommendations:  - Continue taking levaquin once daily for 5 more days (start this tomorrow, 7/27) - You may also take mucinex and tessalon as needed for cough. Lozenges and 1 tbsp of honey are also helpful for cough.  - The cough should continue for several more days - weeks, though will eventually resolve. If your symptoms get WORSE instead of  better, or worse after initially improving, or you notice a fever or trouble breathing, seek medical care right away.  - Otherwise, you will need to establish care with a PCP as soon as possible for ongoing medical care.     Allergies as of 06/18/2017      Reactions   Sulfamethoxazole-trimethoprim    REACTION: causes nausea sweats      Medication List    TAKE these medications   benzonatate 100 MG capsule Commonly known as:  TESSALON Take 1 capsule (100 mg total) by mouth 3 (three) times daily as needed for cough.   dextromethorphan-guaiFENesin 30-600 MG 12hr tablet Commonly known as:  MUCINEX DM Take 1 tablet by mouth 2 (two) times daily.   levofloxacin 500 MG tablet Commonly known as:  LEVAQUIN Take 1 tablet (500 mg total) by mouth daily.      Follow-up Information    Norins, Heinz Knuckles, MD. Schedule an appointment as soon as possible for a visit.   Specialty:  Internal Medicine Contact information: [place] [address] [phone]          Allergies  Allergen Reactions  . Sulfamethoxazole-Trimethoprim     REACTION: causes nausea sweats    Consultations:  None  Procedures/Studies: Dg Chest 2 View  Result Date: 06/17/2017 CLINICAL DATA:  Productive cough. EXAM: CHEST  2 VIEW COMPARISON:  None. FINDINGS: The heart size and mediastinal contours are within normal limits. Atherosclerosis of thoracic aorta is noted. No pneumothorax or pleural effusion is noted. Mild bilateral basilar opacities are noted concerning for atelectasis or possibly infiltrates. Right midlung opacity is noted concerning  for possible pneumonia. The visualized skeletal structures are unremarkable. IMPRESSION: Possible right midlung pneumonia. Mild bilateral basilar opacities concerning for atelectasis or possibly infiltrates. Followup PA and lateral chest X-ray is recommended in 3-4 weeks following trial of antibiotic therapy to ensure resolution and exclude underlying malignancy.  Electronically Signed   By: Marijo Conception, M.D.   On: 06/17/2017 17:24   Subjective: Feels better than yesterday since antibiotics. Eating, Walked full lap around ward this morning never dipping below 91%, no dyspnea fever or chest pain.   Discharge Exam: Vitals:   06/18/17 0225 06/18/17 0614  BP: (!) 144/91 (!) 148/85  Pulse: 80 (!) 103  Resp: 18 18  Temp: 98.2 F (36.8 C) 98.6 F (37 C)   General: Pt is alert, awake, not in acute distress Cardiovascular: RRR, S1/S2 +, no rubs, no gallops Respiratory: CTA bilaterally, no wheezing, no rhonchi Abdominal: Soft, NT, ND, bowel sounds + Extremities: No edema, no cyanosis  Labs: Basic Metabolic Panel:  Recent Labs Lab 06/17/17 2230 06/18/17 0523  NA 138 140  K 4.0 4.4  CL 99* 102  CO2 29 31  GLUCOSE 202* 131*  BUN 13 11  CREATININE 0.79 0.70  CALCIUM 8.8* 8.6*   Liver Function Tests:  Recent Labs Lab 06/18/17 0523  AST 43*  ALT 47  ALKPHOS 60  BILITOT 0.6  PROT 6.3*  ALBUMIN 2.7*   CBC:  Recent Labs Lab 06/17/17 2230 06/18/17 0523  WBC 9.8 10.1  NEUTROABS 6.3 6.7  HGB 13.5 12.7  HCT 40.3 37.2  MCV 96.0 94.2  PLT 370 317   Time coordinating discharge: Approximately 40 minutes  Vance Gather, MD  [facility] 06/18/2017, 11:00 AM Pager [phone]

## 2017-06-18 NOTE — Care Management Note (Signed)
Case Management Note  Patient Details  Name: Rakhi Romagnoli MRN: [id number] Date of Birth: Aug 09, 1933  Subjective/Objective:      pna              Action/Plan: Date:  June 18 2017  Chart reviewed for concurrent status and case management needs.  Will continue to follow patient progress.  Discharge Planning: following for needs  Expected discharge date: [id number]  Velva Harman, BSN, [place], [place]   Expected Discharge Date:                  Expected Discharge Plan:  Home/Self Care  In-House Referral:     Discharge planning Services  CM Consult  Post Acute Care Choice:    Choice offered to:     DME Arranged:    DME Agency:     HH Arranged:    HH Agency:     Status of Service:  In process, will continue to follow  If discussed at Long Length of Stay Meetings, dates discussed:    Additional Comments:  Leeroy Cha, RN 06/18/2017, 9:41 AM

## 2017-06-18 NOTE — H&P (Signed)
History and Physical  Adonia Porada OIN:[id number] DOB: [birth year] DOA: 06/17/2017  PCP:  Neena Rhymes, MD   Chief Complaint:  Cough  History of Present Illness:  Pt is a 83 yo female with no significant pmh who came with cc of cough for the past week, productive of sputum w/o blood with no chest pain, fever/chills, N/V/D/C/abd pain or dysuria. No other complaints. Pt wanted to go home but O2 sat was 83 on RA. No preceding URI symptoms. No asthma. Hx of smoking for 20+ years over 30 years ago.   Review of Systems:  CONSTITUTIONAL:     No night sweats.  No fatigue.  No fever. No chills. Eyes:                            No visual changes.  No eye pain.  No eye discharge.   ENT:                              No epistaxis.  No sinus pain.  No sore throat.   No congestion. RESPIRATORY:           +cough.  No wheeze.  No hemoptysis.  No dyspnea CARDIOVASCULAR   :  No chest pains.  No palpitations. GASTROINTESTINAL:  No abdominal pain.  No nausea. No vomiting.  No diarrhea. No   constipation.  No hematemesis.  No hematochezia.  No melena. GENITOURINARY:      No urgency.  No frequency.  No dysuria.  No hematuria.  No     obstructive symptoms.  No discharge.  No pain.   MUSCULOSKELETAL:  No musculoskeletal pain.  No joint swelling.  No arthritis. NEUROLOGICAL:        No confusion.  No weakness. No headache. No seizure. PSYCHIATRIC:             No depression. No anxiety. No suicidal ideation. SKIN:                             No rashes.  No lesions.  No wounds. ENDOCRINE:                No weight loss.  No polydipsia.  No polyuria.  No polyphagia. HEMATOLOGIC:           No purpura.  No petechiae.  No bleeding.  ALLERGIC                 : No pruritus.  No angioedema Other:  Past Medical and Surgical History:   History reviewed. No pertinent past medical history. Past Surgical History:  Procedure Laterality Date  . REPLACEMENT TOTAL KNEE Bilateral     Social History:   has no tobacco, alcohol, and drug history on file. Smoked for 20+ years over 30 years ago   Allergies  Allergen Reactions  . Sulfamethoxazole-Trimethoprim     REACTION: causes nausea sweats    History reviewed. No pertinent family history.   Prior to Admission medications   Not on File    Physical Exam: BP (!) 143/77 (BP Location: Left Arm)   Pulse 75   Temp 97.8 F (36.6 C) (Oral)   Resp (!) 24   Ht 5\' 5"  (1.651 m)   Wt 70.9 kg (156 lb 6.4 oz)   SpO2 98%   BMI 26.03 kg/m   GENERAL :  Alert and cooperative, and appears to be in no acute distress. HEAD:           normocephalic. EYES:            PERRL, EOMI.  vision is grossly intact. EARS:           hearing grossly intact. NECK:          supple CARDIAC:    Normal S1 and S2. No gallop. No murmurs.  Vascular:     no peripheral edema.  LUNGS:       Clear to auscultation  ABDOMEN: Positive bowel sounds. Soft, nondistended, nontender. No guarding or rebound.      MSK:           No joint erythema or tenderness. Normal muscular development. EXT           : No significant deformity or joint abnormality. Neuro        : Alert, oriented to person, place, and time.                      CN II-XII intact.   SKIN:            No rash. No lesions. PSYCH:       No hallucination. Patient is not suicidal.          Labs on Admission:  Reviewed.   Radiological Exams on Admission: Dg Chest 2 View  Result Date: 06/17/2017 CLINICAL DATA:  Productive cough. EXAM: CHEST  2 VIEW COMPARISON:  None. FINDINGS: The heart size and mediastinal contours are within normal limits. Atherosclerosis of thoracic aorta is noted. No pneumothorax or pleural effusion is noted. Mild bilateral basilar opacities are noted concerning for atelectasis or possibly infiltrates. Right midlung opacity is noted concerning for possible pneumonia. The visualized skeletal structures are unremarkable. IMPRESSION: Possible right midlung pneumonia. Mild bilateral basilar  opacities concerning for atelectasis or possibly infiltrates. Followup PA and lateral chest X-ray is recommended in 3-4 weeks following trial of antibiotic therapy to ensure resolution and exclude underlying malignancy. Electronically Signed   By: Marijo Conception, M.D.   On: 06/17/2017 17:24      Assessment/Plan  CAP; Started on ceftriaxone/aztirhomycin  Strep pneumon ag sent Sputum cx sent Oxygen therapy   Input & Output: NA Lines & Tubes: P IV DVT prophylaxis:  enoxaparin GI prophylaxis: NA Consultants: NA Code Status: Full Family Communication: none at bedside  Disposition Plan: home once improved    Gennaro Africa M.D [facility]

## 2017-06-18 NOTE — Progress Notes (Signed)
Assessment unchanged. Pt verbalized understanding of dc instructions through teach back including follow up care, activity to resume as well as medications to resume. Prescriptions x 3 given as provided by MD. Discharged via wc to ED entrance accompanied by NT.

## 2017-06-22 DIAGNOSIS — J181 Lobar pneumonia, unspecified organism: Secondary | ICD-10-CM

## 2017-06-22 DIAGNOSIS — J189 Pneumonia, unspecified organism: Secondary | ICD-10-CM

## 2017-06-22 DIAGNOSIS — R0902 Hypoxemia: Secondary | ICD-10-CM

## 2017-06-23 LAB — CULTURE, BLOOD (ROUTINE X 2)
Culture: NO GROWTH
Culture: NO GROWTH
Special Requests: ADEQUATE
Special Requests: ADEQUATE

## 2017-06-26 ENCOUNTER — Ambulatory Visit: Payer: Medicare Other | Admitting: Family Medicine

## 2017-06-26 ENCOUNTER — Encounter: Payer: Self-pay | Admitting: Family Medicine

## 2017-06-26 VITALS — BP 112/63 | HR 56 | Ht 64.0 in | Wt 171.3 lb

## 2017-06-26 DIAGNOSIS — R413 Other amnesia: Secondary | ICD-10-CM

## 2017-06-26 DIAGNOSIS — Z719 Counseling, unspecified: Secondary | ICD-10-CM

## 2017-06-26 DIAGNOSIS — Z1389 Encounter for screening for other disorder: Secondary | ICD-10-CM

## 2017-06-26 DIAGNOSIS — Z13220 Encounter for screening for lipoid disorders: Secondary | ICD-10-CM

## 2017-06-26 DIAGNOSIS — E669 Obesity, unspecified: Secondary | ICD-10-CM

## 2017-06-26 DIAGNOSIS — Z0000 Encounter for general adult medical examination without abnormal findings: Secondary | ICD-10-CM

## 2017-06-26 DIAGNOSIS — J189 Pneumonia, unspecified organism: Secondary | ICD-10-CM

## 2017-06-26 DIAGNOSIS — Z87891 Personal history of nicotine dependence: Secondary | ICD-10-CM

## 2017-06-26 DIAGNOSIS — Z8639 Personal history of other endocrine, nutritional and metabolic disease: Secondary | ICD-10-CM

## 2017-06-26 DIAGNOSIS — J181 Lobar pneumonia, unspecified organism: Secondary | ICD-10-CM

## 2017-06-26 DIAGNOSIS — Z1159 Encounter for screening for other viral diseases: Secondary | ICD-10-CM

## 2017-06-26 DIAGNOSIS — Z114 Encounter for screening for human immunodeficiency virus [HIV]: Secondary | ICD-10-CM

## 2017-06-26 DIAGNOSIS — K644 Residual hemorrhoidal skin tags: Secondary | ICD-10-CM

## 2017-06-26 DIAGNOSIS — Z131 Encounter for screening for diabetes mellitus: Secondary | ICD-10-CM

## 2017-06-26 DIAGNOSIS — R5383 Other fatigue: Secondary | ICD-10-CM

## 2017-06-26 NOTE — Patient Instructions (Addendum)
3 wks- to review in detail bldwrk ( but we will call you before then with any critical/ important ones)  And repeat CXR       Please realize, EXERCISE IS MEDICINE!  -  American Heart Association [facility]) guidelines for exercise : If you are in good health, without any medical conditions, you should engage in 150 minutes of moderate intensity aerobic activity per week.  This means you should be huffing and puffing throughout your workout.   Engaging in regular exercise will improve brain function and memory, as well as improve mood, boost immune system and help with weight management.  As well as the other, more well-known effects of exercise such as decreasing blood sugar levels, decreasing blood pressure,  and decreasing bad cholesterol levels/ increasing good cholesterol levels.     -  The AHA strongly endorses consumption of a diet that contains a variety of foods from all the food categories with an emphasis on fruits and vegetables; fat-free and low-fat dairy products; cereal and grain products; legumes and nuts; and fish, poultry, and/or extra lean meats.    Excessive food intake, especially of foods high in saturated and trans fats, sugar, and salt, should be avoided.    Adequate water intake of roughly 1/2 of your weight in pounds, should equal the ounces of water per day you should drink.  So for instance, if you're 200 pounds, that would be 100 ounces of water per day.         Mediterranean Diet  Why follow it? Research shows. . Those who follow the Mediterranean diet have a reduced risk of heart disease  . The diet is associated with a reduced incidence of Parkinson's and Alzheimer's diseases . People following the diet may have longer life expectancies and lower rates of chronic diseases  . The Dietary Guidelines for Americans recommends the Mediterranean diet as an eating plan to promote health and prevent disease  What Is the Mediterranean Diet?  . Healthy eating plan based on  typical foods and recipes of Mediterranean-style cooking . The diet is primarily a plant based diet; these foods should make up a majority of meals   Starches - Plant based foods should make up a majority of meals - They are an important sources of vitamins, minerals, energy, antioxidants, and fiber - Choose whole grains, foods high in fiber and minimally processed items  - Typical grain sources include wheat, oats, barley, corn, brown rice, bulgar, farro, millet, polenta, couscous  - Various types of beans include chickpeas, lentils, fava beans, black beans, white beans   Fruits  Veggies - Large quantities of antioxidant rich fruits & veggies; 6 or more servings  - Vegetables can be eaten raw or lightly drizzled with oil and cooked  - Vegetables common to the traditional Mediterranean Diet include: artichokes, arugula, beets, broccoli, brussel sprouts, cabbage, carrots, celery, collard greens, cucumbers, eggplant, kale, leeks, lemons, lettuce, mushrooms, okra, onions, peas, peppers, potatoes, pumpkin, radishes, rutabaga, shallots, spinach, sweet potatoes, turnips, zucchini - Fruits common to the Mediterranean Diet include: apples, apricots, avocados, cherries, clementines, dates, figs, grapefruits, grapes, melons, nectarines, oranges, peaches, pears, pomegranates, strawberries, tangerines  Fats - Replace butter and margarine with healthy oils, such as olive oil, canola oil, and tahini  - Limit nuts to no more than a handful a day  - Nuts include walnuts, almonds, pecans, pistachios, pine nuts  - Limit or avoid candied, honey roasted or heavily salted nuts - Olives are central to the  Mediterranean diet - can be eaten whole or used in a variety of dishes   Meats Protein - Limiting red meat: no more than a few times a month - When eating red meat: choose lean cuts and keep the portion to the size of deck of cards - Eggs: approx. 0 to 4 times a week  - Fish and lean poultry: at least 2 a week  -  Healthy protein sources include, chicken, [place], lean beef, lamb - Increase intake of seafood such as tuna, salmon, trout, mackerel, shrimp, scallops - Avoid or limit high fat processed meats such as sausage and bacon  Dairy - Include moderate amounts of low fat dairy products  - Focus on healthy dairy such as fat free yogurt, skim milk, low or reduced fat cheese - Limit dairy products higher in fat such as whole or 2% milk, cheese, ice cream  Alcohol - Moderate amounts of red wine is ok  - No more than 5 oz daily for women (all ages) and men older than age 39  - No more than 10 oz of wine daily for men younger than 56  Other - Limit sweets and other desserts  - Use herbs and spices instead of salt to flavor foods  - Herbs and spices common to the traditional Mediterranean Diet include: basil, bay leaves, chives, cloves, cumin, fennel, garlic, lavender, marjoram, mint, oregano, parsley, pepper, rosemary, sage, savory, sumac, tarragon, thyme   It's not just a diet, it's a lifestyle:  . The Mediterranean diet includes lifestyle factors typical of those in the region  . Foods, drinks and meals are best eaten with others and savored . Daily physical activity is important for overall good health . This could be strenuous exercise like running and aerobics . This could also be more leisurely activities such as walking, housework, yard-work, or taking the stairs . Moderation is the key; a balanced and healthy diet accommodates most foods and drinks . Consider portion sizes and frequency of consumption of certain foods   Meal Ideas & Options:  . Breakfast:  o Whole wheat toast or whole wheat English muffins with peanut butter & hard boiled egg o Steel cut oats topped with apples & cinnamon and skim milk  o Fresh fruit: banana, strawberries, melon, berries, peaches  o Smoothies: strawberries, bananas, greek yogurt, peanut butter o Low fat greek yogurt with blueberries and granola  o Egg white  omelet with spinach and mushrooms o Breakfast couscous: whole wheat couscous, apricots, skim milk, cranberries  . Sandwiches:  o Hummus and grilled vegetables (peppers, zucchini, squash) on whole wheat bread   o Grilled chicken on whole wheat pita with lettuce, tomatoes, cucumbers or tzatziki  o Tuna salad on whole wheat bread: tuna salad made with greek yogurt, olives, red peppers, capers, green onions o Garlic rosemary lamb pita: lamb sauted with garlic, rosemary, salt & pepper; add lettuce, cucumber, greek yogurt to pita - flavor with lemon juice and black pepper  . Seafood:  o Mediterranean grilled salmon, seasoned with garlic, basil, parsley, lemon juice and black pepper o Shrimp, lemon, and spinach whole-grain pasta salad made with low fat greek yogurt  o Seared scallops with lemon orzo  o Seared tuna steaks seasoned salt, pepper, coriander topped with tomato mixture of olives, tomatoes, olive oil, minced garlic, parsley, green onions and cappers  . Meats:  o Herbed greek chicken salad with kalamata olives, cucumber, feta  o Red bell peppers stuffed with spinach, bulgur, lean ground  beef (or lentils) & topped with feta   o Kebabs: skewers of chicken, tomatoes, onions, zucchini, squash  o [place] burgers: made with red onions, mint, dill, lemon juice, feta cheese topped with roasted red peppers . Vegetarian o Cucumber salad: cucumbers, artichoke hearts, celery, red onion, feta cheese, tossed in olive oil & lemon juice  o Hummus and whole grain pita points with a greek salad (lettuce, tomato, feta, olives, cucumbers, red onion) o Lentil soup with celery, carrots made with vegetable broth, garlic, salt and pepper  o Tabouli salad: parsley, bulgur, mint, scallions, cucumbers, tomato, radishes, lemon juice, olive oil, salt and pepper.

## 2017-06-26 NOTE — Progress Notes (Signed)
New patient office visit note:  Impression and Recommendations:    1. Community acquired pneumonia of right middle lobe of lung ([place])   2. History of cigarette smoking   3. External hemorrhoids   4. Other fatigue   5. Memory changes   6. Encounter for wellness examination   7. Screening for multiple conditions   8. Health education/counseling   9. Obesity, unspecified classification, unspecified obesity type, unspecified whether serious comorbidity present   10. Need for hepatitis C screening test   11. Encounter for screening for HIV   12. History of vitamin D deficiency   13. Screening cholesterol level   14. Encounter for screening examination for impaired glucose regulation and diabetes mellitus   15. Fatigue, unspecified type      No problem-specific Assessment & Plan notes found for this encounter.   The patient was counseled, risk factors were discussed, anticipatory guidance given.   New Prescriptions   No medications on file    No orders of the defined types were placed in this encounter.   Discontinued Medications   BENZONATATE (TESSALON) 100 MG CAPSULE    Take 1 capsule (100 mg total) by mouth 3 (three) times daily as needed for cough.   DEXTROMETHORPHAN-GUAIFENESIN (MUCINEX DM) 30-600 MG 12HR TABLET    Take 1 tablet by mouth 2 (two) times daily.    Modified Medications   No medications on file    Orders Placed This Encounter  Procedures  . CBC with Differential/Platelet     Gross side effects, risk and benefits, and alternatives of medications discussed with patient.  Patient is aware that all medications have potential side effects and we are unable to predict every side effect or drug-drug interaction that may occur.  Expresses verbal understanding and consents to current therapy plan and treatment regimen.  Return for 3wk-s- CXR and review bldwrk.  Please see AVS handed out to patient at the end of our visit for further patient  instructions/ counseling done pertaining to today's office visit.    Note: This document was prepared using Dragon voice recognition software and may include unintentional dictation errors.  ----------------------------------------------------------------------------------------------------------------------    Subjective:    Chief complaint:   Chief Complaint  Patient presents with  . Establish Care     HPI: Peggy Howard is a pleasant 83 y.o. female who presents to [place] [facility] today to review their medical history with me and establish care.   I asked the patient to review their chronic problem list with me to ensure everything was updated and accurate.    All recent office visits with other providers, any medical records that patient brought in etc  - I reviewed today.     Also asked pt to get me medical records from [facility] providers/ specialists that they had seen within the past 3-5 years- if they are in private practice and/or do not work for a [employer], [facility], [place], [place] or [employer] owned practice.  Told them to call their specialists to clarify this if they are not sure.     Recently seen [phone] for today acquired pneumonia.  Was recommended repeat chest x-ray 4 weeks.   Dr Maureen Ralphs-  R and L knee replacements- 2004, and 1999.  Hystectomy 1986  Pt hasn't seen a doc for many , many yrs.   Vitamin d - makes her dizzy  Problem  Colonoscopy Refused  Refuses Treatment  Parent Refuses Immunizations  Patient Refuses to Take Medication      Wt Readings from Last 3 Encounters:  07/16/17 167 lb 8 oz (76 kg)  06/26/17 171 lb 4.8 oz (77.7 kg)  06/18/17 177 lb 1.6 oz (80.3 kg)   BP Readings from Last 3 Encounters:  07/16/17 (!) 162/80  06/26/17 112/63  06/18/17 (!) 148/85   Pulse Readings from Last 3 Encounters:  07/16/17 76  06/26/17 (!) 56  06/18/17 (!) 103   BMI Readings from Last 3 Encounters:  07/16/17 28.75 kg/m    06/26/17 29.40 kg/m  06/18/17 29.47 kg/m    Patient Care Team    Relationship Specialty Notifications Start End  Mellody Dance, DO PCP - General Family Medicine  06/26/17     Patient Active Problem List   Diagnosis Date Noted  . Overweight (BMI 25.0-29.9) 07/16/2017  . Colonoscopy refused 07/16/2017  . Refuses treatment 07/16/2017  . Parent refuses immunizations 07/16/2017  . Patient refuses to take medication 07/16/2017  . History of cigarette smoking 06/26/2017  . Other fatigue 06/26/2017  . Memory changes 06/26/2017  . Community acquired pneumonia of right middle lobe of lung ([place])   . External hemorrhoids 01/26/2008     History reviewed. No pertinent past medical history.   History reviewed. No pertinent past medical history.   Past Surgical History:  Procedure Laterality Date  . ABDOMINAL HYSTERECTOMY    . REPLACEMENT TOTAL KNEE Bilateral      History reviewed. No pertinent family history.   History  Drug Use No     History  Alcohol Use No     History  Smoking Status  . Never Smoker  Smokeless Tobacco  . Never Used     Outpatient Encounter Prescriptions as of 06/26/2017  Medication Sig  . [DISCONTINUED] benzonatate (TESSALON) 100 MG capsule Take 1 capsule (100 mg total) by mouth 3 (three) times daily as needed for cough.  . [DISCONTINUED] dextromethorphan-guaiFENesin (MUCINEX DM) 30-600 MG 12hr tablet Take 1 tablet by mouth 2 (two) times daily.   No facility-administered encounter medications on file as of 06/26/2017.     Allergies: Sulfamethoxazole-trimethoprim   ROS   Objective:   Blood pressure 112/63, pulse (!) 56, height '5\' 4"'$  (1.626 m), weight 171 lb 4.8 oz (77.7 kg). Body mass index is 29.4 kg/m. General: Well Developed, well nourished, and in no acute distress.  Neuro: Alert and oriented x3, extra-ocular muscles intact, sensation grossly intact.  HEENT:[place]/AT, PERRLA, neck supple, No carotid bruits Skin: no gross rashes   Cardiac: Regular rate and rhythm Respiratory: Essentially clear to auscultation bilaterally. Not using accessory muscles, speaking in full sentences.  Abdominal: not grossly distended Musculoskeletal: Ambulates w/o diff, FROM * 4 ext.  Vasc: less 2 sec cap RF, warm and pink  Psych:  No HI/SI, judgement and insight good, Euthymic mood. Full Affect.    Recent Results (from the past 2160 hour(s))  CBC with Differential     Status: None   Collection Time: 06/17/17 10:30 PM  Result Value Ref Range   WBC 9.8 4.0 - 10.5 K/uL   RBC 4.20 3.87 - 5.11 MIL/uL   Hemoglobin 13.5 12.0 - 15.0 g/dL   HCT 40.3 36.0 - 46.0 %   MCV 96.0 78.0 - 100.0 fL   MCH 32.1 26.0 - 34.0 pg   MCHC 33.5 30.0 - 36.0 g/dL   RDW 13.2 11.5 - 15.5 %   Platelets 370 150 - 400 K/uL   Neutrophils Relative % 64 %   Lymphocytes  Relative 27 %   Monocytes Relative 8 %   Eosinophils Relative 1 %   Basophils Relative 0 %   Neutro Abs 6.3 1.7 - 7.7 K/uL   Lymphs Abs 2.6 0.7 - 4.0 K/uL   Monocytes Absolute 0.8 0.1 - 1.0 K/uL   Eosinophils Absolute 0.1 0.0 - 0.7 K/uL   Basophils Absolute 0.0 0.0 - 0.1 K/uL   Smear Review MORPHOLOGY UNREMARKABLE   Basic metabolic panel     Status: Abnormal   Collection Time: 06/17/17 10:30 PM  Result Value Ref Range   Sodium 138 135 - 145 mmol/L   Potassium 4.0 3.5 - 5.1 mmol/L   Chloride 99 (L) 101 - 111 mmol/L   CO2 29 22 - 32 mmol/L   Glucose, Bld 202 (H) 65 - 99 mg/dL   BUN 13 6 - 20 mg/dL   Creatinine, Ser 7.57 0.44 - 1.00 mg/dL   Calcium 8.8 (L) 8.9 - 10.3 mg/dL   GFR calc non Af Amer >60 >60 mL/min   GFR calc Af Amer >60 >60 mL/min    Comment: (NOTE) The eGFR has been calculated using the CKD EPI equation. This calculation has not been validated in all clinical situations. eGFR's persistently <60 mL/min signify possible Chronic Kidney Disease.    Anion gap 10 5 - 15  Blood culture (routine x 2)     Status: None   Collection Time: 06/17/17 10:30 PM  Result Value Ref Range    Specimen Description BLOOD RIGHT ANTECUBITAL    Special Requests      BOTTLES DRAWN AEROBIC AND ANAEROBIC Blood Culture adequate volume   Culture      NO GROWTH 5 DAYS Performed at [facility], [address], [place], [place] [address]    Report Status 06/23/2017 FINAL   CG4 I-STAT (Lactic acid)     Status: None   Collection Time: 06/17/17 10:39 PM  Result Value Ref Range   Lactic Acid, Venous 1.88 0.5 - 1.9 mmol/L  Blood culture (routine x 2)     Status: None   Collection Time: 06/17/17 11:32 PM  Result Value Ref Range   Specimen Description BLOOD LEFT ANTECUBITAL    Special Requests      BOTTLES DRAWN AEROBIC AND ANAEROBIC Blood Culture adequate volume   Culture      NO GROWTH 5 DAYS Performed at [facility], [address] [place], [place] [address]    Report Status 06/23/2017 FINAL   HIV antibody     Status: None   Collection Time: 06/18/17  1:18 AM  Result Value Ref Range   HIV Screen 4th Generation wRfx Non Reactive Non Reactive    Comment: (NOTE) Performed At: [facility] [address] [place], [place] [id number] Mila Homer MD UB:[id number]   CG4 I-STAT (Lactic acid)     Status: None   Collection Time: 06/18/17  1:28 AM  Result Value Ref Range   Lactic Acid, Venous 1.21 0.5 - 1.9 mmol/L  Comprehensive metabolic panel     Status: Abnormal   Collection Time: 06/18/17  5:23 AM  Result Value Ref Range   Sodium 140 135 - 145 mmol/L   Potassium 4.4 3.5 - 5.1 mmol/L   Chloride 102 101 - 111 mmol/L   CO2 31 22 - 32 mmol/L   Glucose, Bld 131 (H) 65 - 99 mg/dL   BUN 11 6 - 20 mg/dL   Creatinine, Ser 5.20 0.44 - 1.00 mg/dL   Calcium  8.6 (L) 8.9 - 10.3 mg/dL   Total Protein 6.3 (L) 6.5 - 8.1 g/dL   Albumin 2.7 (L) 3.5 - 5.0 g/dL   AST 43 (H) 15 - 41 U/L   ALT 47 14 - 54 U/L   Alkaline Phosphatase 60 38 - 126 U/L   Total Bilirubin 0.6 0.3 - 1.2 mg/dL   GFR calc non Af Amer >60 >60 mL/min   GFR calc Af Amer >60 >60 mL/min    Comment:  (NOTE) The eGFR has been calculated using the CKD EPI equation. This calculation has not been validated in all clinical situations. eGFR's persistently <60 mL/min signify possible Chronic Kidney Disease.    Anion gap 7 5 - 15  CBC WITH DIFFERENTIAL     Status: None   Collection Time: 06/18/17  5:23 AM  Result Value Ref Range   WBC 10.1 4.0 - 10.5 K/uL   RBC 3.95 3.87 - 5.11 MIL/uL   Hemoglobin 12.7 12.0 - 15.0 g/dL   HCT 37.2 36.0 - 46.0 %   MCV 94.2 78.0 - 100.0 fL   MCH 32.2 26.0 - 34.0 pg   MCHC 34.1 30.0 - 36.0 g/dL   RDW 13.0 11.5 - 15.5 %   Platelets 317 150 - 400 K/uL   Neutrophils Relative % 66 %   Neutro Abs 6.7 1.7 - 7.7 K/uL   Lymphocytes Relative 22 %   Lymphs Abs 2.2 0.7 - 4.0 K/uL   Monocytes Relative 9 %   Monocytes Absolute 0.9 0.1 - 1.0 K/uL   Eosinophils Relative 2 %   Eosinophils Absolute 0.2 0.0 - 0.7 K/uL   Basophils Relative 1 %   Basophils Absolute 0.1 0.0 - 0.1 K/uL  CBC with Differential/Platelet     Status: Abnormal   Collection Time: 06/26/17 10:45 AM  Result Value Ref Range   WBC 8.6 3.4 - 10.8 x10E3/uL   RBC 4.39 3.77 - 5.28 x10E6/uL   Hemoglobin 13.8 11.1 - 15.9 g/dL   Hematocrit 41.6 34.0 - 46.6 %   MCV 95 79 - 97 fL   MCH 31.4 26.6 - 33.0 pg   MCHC 33.2 31.5 - 35.7 g/dL   RDW 13.3 12.3 - 15.4 %   Platelets 555 (H) 150 - 379 x10E3/uL   Neutrophils 68 Not Estab. %   Lymphs 24 Not Estab. %   Monocytes 7 Not Estab. %   Eos 1 Not Estab. %   Basos 0 Not Estab. %   Neutrophils Absolute 5.9 1.4 - 7.0 x10E3/uL   Lymphocytes Absolute 2.0 0.7 - 3.1 x10E3/uL   Monocytes Absolute 0.6 0.1 - 0.9 x10E3/uL   EOS (ABSOLUTE) 0.1 0.0 - 0.4 x10E3/uL   Basophils Absolute 0.0 0.0 - 0.2 x10E3/uL   Immature Granulocytes 0 Not Estab. %   Immature Grans (Abs) 0.0 0.0 - 0.1 x10E3/uL

## 2017-06-27 LAB — CBC WITH DIFFERENTIAL/PLATELET
BASOS ABS: 0 10*3/uL (ref 0.0–0.2)
Basos: 0 %
EOS (ABSOLUTE): 0.1 10*3/uL (ref 0.0–0.4)
Eos: 1 %
Hematocrit: 41.6 % (ref 34.0–46.6)
Hemoglobin: 13.8 g/dL (ref 11.1–15.9)
IMMATURE GRANULOCYTES: 0 %
Immature Grans (Abs): 0 10*3/uL (ref 0.0–0.1)
LYMPHS ABS: 2 10*3/uL (ref 0.7–3.1)
Lymphs: 24 %
MCH: 31.4 pg (ref 26.6–33.0)
MCHC: 33.2 g/dL (ref 31.5–35.7)
MCV: 95 fL (ref 79–97)
MONOS ABS: 0.6 10*3/uL (ref 0.1–0.9)
Monocytes: 7 %
NEUTROS PCT: 68 %
Neutrophils Absolute: 5.9 10*3/uL (ref 1.4–7.0)
PLATELETS: 555 10*3/uL — AB (ref 150–379)
RBC: 4.39 x10E6/uL (ref 3.77–5.28)
RDW: 13.3 % (ref 12.3–15.4)
WBC: 8.6 10*3/uL (ref 3.4–10.8)

## 2017-07-16 ENCOUNTER — Encounter: Payer: Self-pay | Admitting: Family Medicine

## 2017-07-16 ENCOUNTER — Ambulatory Visit (INDEPENDENT_AMBULATORY_CARE_PROVIDER_SITE_OTHER): Payer: Medicare Other | Admitting: Family Medicine

## 2017-07-16 VITALS — BP 162/80 | HR 76 | Ht 64.0 in | Wt 167.5 lb

## 2017-07-16 DIAGNOSIS — J181 Lobar pneumonia, unspecified organism: Secondary | ICD-10-CM

## 2017-07-16 DIAGNOSIS — R413 Other amnesia: Secondary | ICD-10-CM

## 2017-07-16 DIAGNOSIS — R5383 Other fatigue: Secondary | ICD-10-CM

## 2017-07-16 DIAGNOSIS — Z5320 Procedure and treatment not carried out because of patient's decision for unspecified reasons: Secondary | ICD-10-CM

## 2017-07-16 DIAGNOSIS — E663 Overweight: Secondary | ICD-10-CM

## 2017-07-16 DIAGNOSIS — Z0000 Encounter for general adult medical examination without abnormal findings: Secondary | ICD-10-CM

## 2017-07-16 DIAGNOSIS — R7302 Impaired glucose tolerance (oral): Secondary | ICD-10-CM

## 2017-07-16 DIAGNOSIS — Z13220 Encounter for screening for lipoid disorders: Secondary | ICD-10-CM

## 2017-07-16 DIAGNOSIS — J189 Pneumonia, unspecified organism: Secondary | ICD-10-CM

## 2017-07-16 DIAGNOSIS — Z2882 Immunization not carried out because of caregiver refusal: Secondary | ICD-10-CM

## 2017-07-16 DIAGNOSIS — Z87891 Personal history of nicotine dependence: Secondary | ICD-10-CM

## 2017-07-16 NOTE — Patient Instructions (Addendum)
Please check your blood pressure at [facility] and write it down and bring in next office visit so that we can review it.  Please note that high blood pressure that's been uncontrolled for.  If time can lead to heart attacks and strokes and untimely death if not controlled    Patient wishes to be called regarding her lab results and not have a follow-up.  Patient declines repeat chest x-ray to see resolution of pneumonia that she had approximately one month ago.  If you change your mind please let me know.  Please realize, EXERCISE IS MEDICINE!  -  American Heart Association [facility]) guidelines for exercise : If you are in good health, without any medical conditions, you should engage in 150 minutes of moderate intensity aerobic activity per week.  This means you should be huffing and puffing throughout your workout.   Engaging in regular exercise will improve brain function and memory, as well as improve mood, boost immune system and help with weight management.  As well as the other, more well-known effects of exercise such as decreasing blood sugar levels, decreasing blood pressure,  and decreasing bad cholesterol levels/ increasing good cholesterol levels.     -  The AHA strongly endorses consumption of a diet that contains a variety of foods from all the food categories with an emphasis on fruits and vegetables; fat-free and low-fat dairy products; cereal and grain products; legumes and nuts; and fish, poultry, and/or extra lean meats.    Excessive food intake, especially of foods high in saturated and trans fats, sugar, and salt, should be avoided.    Adequate water intake of roughly 1/2 of your weight in pounds, should equal the ounces of water per day you should drink.  So for instance, if you're 200 pounds, that would be 100 ounces of water per day.         Mediterranean Diet  Why follow it? Research shows. . Those who follow the Mediterranean diet have a reduced risk of heart disease   . The diet is associated with a reduced incidence of Parkinson's and Alzheimer's diseases . People following the diet may have longer life expectancies and lower rates of chronic diseases  . The Dietary Guidelines for Americans recommends the Mediterranean diet as an eating plan to promote health and prevent disease  What Is the Mediterranean Diet?  . Healthy eating plan based on typical foods and recipes of Mediterranean-style cooking . The diet is primarily a plant based diet; these foods should make up a majority of meals   Starches - Plant based foods should make up a majority of meals - They are an important sources of vitamins, minerals, energy, antioxidants, and fiber - Choose whole grains, foods high in fiber and minimally processed items  - Typical grain sources include wheat, oats, barley, corn, brown rice, bulgar, farro, millet, polenta, couscous  - Various types of beans include chickpeas, lentils, fava beans, black beans, white beans   Fruits  Veggies - Large quantities of antioxidant rich fruits & veggies; 6 or more servings  - Vegetables can be eaten raw or lightly drizzled with oil and cooked  - Vegetables common to the traditional Mediterranean Diet include: artichokes, arugula, beets, broccoli, brussel sprouts, cabbage, carrots, celery, collard greens, cucumbers, eggplant, kale, leeks, lemons, lettuce, mushrooms, okra, onions, peas, peppers, potatoes, pumpkin, radishes, rutabaga, shallots, spinach, sweet potatoes, turnips, zucchini - Fruits common to the Mediterranean Diet include: apples, apricots, avocados, cherries, clementines, dates, figs, grapefruits, grapes,  melons, nectarines, oranges, peaches, pears, pomegranates, strawberries, tangerines  Fats - Replace butter and margarine with healthy oils, such as olive oil, canola oil, and tahini  - Limit nuts to no more than a handful a day  - Nuts include walnuts, almonds, pecans, pistachios, pine nuts  - Limit or avoid  candied, honey roasted or heavily salted nuts - Olives are central to the Mediterranean diet - can be eaten whole or used in a variety of dishes   Meats Protein - Limiting red meat: no more than a few times a month - When eating red meat: choose lean cuts and keep the portion to the size of deck of cards - Eggs: approx. 0 to 4 times a week  - Fish and lean poultry: at least 2 a week  - Healthy protein sources include, chicken, [place], lean beef, lamb - Increase intake of seafood such as tuna, salmon, trout, mackerel, shrimp, scallops - Avoid or limit high fat processed meats such as sausage and bacon  Dairy - Include moderate amounts of low fat dairy products  - Focus on healthy dairy such as fat free yogurt, skim milk, low or reduced fat cheese - Limit dairy products higher in fat such as whole or 2% milk, cheese, ice cream  Alcohol - Moderate amounts of red wine is ok  - No more than 5 oz daily for women (all ages) and men older than age 66  - No more than 10 oz of wine daily for men younger than 100  Other - Limit sweets and other desserts  - Use herbs and spices instead of salt to flavor foods  - Herbs and spices common to the traditional Mediterranean Diet include: basil, bay leaves, chives, cloves, cumin, fennel, garlic, lavender, marjoram, mint, oregano, parsley, pepper, rosemary, sage, savory, sumac, tarragon, thyme   It's not just a diet, it's a lifestyle:  . The Mediterranean diet includes lifestyle factors typical of those in the region  . Foods, drinks and meals are best eaten with others and savored . Daily physical activity is important for overall good health . This could be strenuous exercise like running and aerobics . This could also be more leisurely activities such as walking, housework, yard-work, or taking the stairs . Moderation is the key; a balanced and healthy diet accommodates most foods and drinks . Consider portion sizes and frequency of consumption of certain  foods   Meal Ideas & Options:  . Breakfast:  o Whole wheat toast or whole wheat English muffins with peanut butter & hard boiled egg o Steel cut oats topped with apples & cinnamon and skim milk  o Fresh fruit: banana, strawberries, melon, berries, peaches  o Smoothies: strawberries, bananas, greek yogurt, peanut butter o Low fat greek yogurt with blueberries and granola  o Egg white omelet with spinach and mushrooms o Breakfast couscous: whole wheat couscous, apricots, skim milk, cranberries  . Sandwiches:  o Hummus and grilled vegetables (peppers, zucchini, squash) on whole wheat bread   o Grilled chicken on whole wheat pita with lettuce, tomatoes, cucumbers or tzatziki  o Tuna salad on whole wheat bread: tuna salad made with greek yogurt, olives, red peppers, capers, green onions o Garlic rosemary lamb pita: lamb sauted with garlic, rosemary, salt & pepper; add lettuce, cucumber, greek yogurt to pita - flavor with lemon juice and black pepper  . Seafood:  o Mediterranean grilled salmon, seasoned with garlic, basil, parsley, lemon juice and black pepper o Shrimp, lemon, and spinach  whole-grain pasta salad made with low fat greek yogurt  o Seared scallops with lemon orzo  o Seared tuna steaks seasoned salt, pepper, coriander topped with tomato mixture of olives, tomatoes, olive oil, minced garlic, parsley, green onions and cappers  . Meats:  o Herbed greek chicken salad with kalamata olives, cucumber, feta  o Red bell peppers stuffed with spinach, bulgur, lean ground beef (or lentils) & topped with feta   o Kebabs: skewers of chicken, tomatoes, onions, zucchini, squash  o [place] burgers: made with red onions, mint, dill, lemon juice, feta cheese topped with roasted red peppers . Vegetarian o Cucumber salad: cucumbers, artichoke hearts, celery, red onion, feta cheese, tossed in olive oil & lemon juice  o Hummus and whole grain pita points with a greek salad (lettuce, tomato, feta, olives,  cucumbers, red onion) o Lentil soup with celery, carrots made with vegetable broth, garlic, salt and pepper  o Tabouli salad: parsley, bulgur, mint, scallions, cucumbers, tomato, radishes, lemon juice, olive oil, salt and pepper.    Hypertension Hypertension, commonly called high blood pressure, is when the force of blood pumping through the arteries is too strong. The arteries are the blood vessels that carry blood from the heart throughout the body. Hypertension forces the heart to work harder to pump blood and may cause arteries to become narrow or stiff. Having untreated or uncontrolled hypertension can cause heart attacks, strokes, kidney disease, and other problems. A blood pressure reading consists of a higher number over a lower number. Ideally, your blood pressure should be below 120/80. The first ("top") number is called the systolic pressure. It is a measure of the pressure in your arteries as your heart beats. The second ("bottom") number is called the diastolic pressure. It is a measure of the pressure in your arteries as the heart relaxes. What are the causes? The cause of this condition is not known. What increases the risk? Some risk factors for high blood pressure are under your control. Others are not. Factors you can change  Smoking.  Having type 2 diabetes mellitus, high cholesterol, or both.  Not getting enough exercise or physical activity.  Being overweight.  Having too much fat, sugar, calories, or salt (sodium) in your diet.  Drinking too much alcohol. Factors that are difficult or impossible to change  Having chronic kidney disease.  Having a family history of high blood pressure.  Age. Risk increases with age.  Race. You may be at higher risk if you are African-American.  Gender. Men are at higher risk than women before age 41. After age 61, women are at higher risk than men.  Having obstructive sleep apnea.  Stress. What are the signs or  symptoms? Extremely high blood pressure (hypertensive crisis) may cause:  Headache.  Anxiety.  Shortness of breath.  Nosebleed.  Nausea and vomiting.  Severe chest pain.  Jerky movements you cannot control (seizures).  How is this diagnosed? This condition is diagnosed by measuring your blood pressure while you are seated, with your arm resting on a surface. The cuff of the blood pressure monitor will be placed directly against the skin of your upper arm at the level of your heart. It should be measured at least twice using the same arm. Certain conditions can cause a difference in blood pressure between your right and left arms. Certain factors can cause blood pressure readings to be lower or higher than normal (elevated) for a short period of time:  When your blood pressure is  higher when you are in a health care provider's office than when you are at home, this is called white coat hypertension. Most people with this condition do not need medicines.  When your blood pressure is higher at home than when you are in a health care provider's office, this is called masked hypertension. Most people with this condition may need medicines to control blood pressure.  If you have a high blood pressure reading during one visit or you have normal blood pressure with other risk factors:  You may be asked to return on a different day to have your blood pressure checked again.  You may be asked to monitor your blood pressure at home for 1 week or longer.  If you are diagnosed with hypertension, you may have other blood or imaging tests to help your health care provider understand your overall risk for other conditions. How is this treated? This condition is treated by making healthy lifestyle changes, such as eating healthy foods, exercising more, and reducing your alcohol intake. Your health care provider may prescribe medicine if lifestyle changes are not enough to get your blood pressure  under control, and if:  Your systolic blood pressure is above 130.  Your diastolic blood pressure is above 80.  Your personal target blood pressure may vary depending on your medical conditions, your age, and other factors. Follow these instructions at home: Eating and drinking  Eat a diet that is high in fiber and potassium, and low in sodium, added sugar, and fat. An example eating plan is called the DASH (Dietary Approaches to Stop Hypertension) diet. To eat this way: ? Eat plenty of fresh fruits and vegetables. Try to fill half of your plate at each meal with fruits and vegetables. ? Eat whole grains, such as whole wheat pasta, brown rice, or whole grain bread. Fill about one quarter of your plate with whole grains. ? Eat or drink low-fat dairy products, such as skim milk or low-fat yogurt. ? Avoid fatty cuts of meat, processed or cured meats, and poultry with skin. Fill about one quarter of your plate with lean proteins, such as fish, chicken without skin, beans, eggs, and tofu. ? Avoid premade and processed foods. These tend to be higher in sodium, added sugar, and fat.  Reduce your daily sodium intake. Most people with hypertension should eat less than 1,500 mg of sodium a day.  Limit alcohol intake to no more than 1 drink a day for nonpregnant women and 2 drinks a day for men. One drink equals 12 oz of beer, 5 oz of wine, or 1 oz of hard liquor. Lifestyle  Work with your health care provider to maintain a healthy body weight or to lose weight. Ask what an ideal weight is for you.  Get at least 30 minutes of exercise that causes your heart to beat faster (aerobic exercise) most days of the week. Activities may include walking, swimming, or biking.  Include exercise to strengthen your muscles (resistance exercise), such as pilates or lifting weights, as part of your weekly exercise routine. Try to do these types of exercises for 30 minutes at least 3 days a week.  Do not use any  products that contain nicotine or tobacco, such as cigarettes and e-cigarettes. If you need help quitting, ask your health care provider.  Monitor your blood pressure at home as told by your health care provider.  Keep all follow-up visits as told by your health care provider. This is important. Medicines  Take over-the-counter and prescription medicines only as told by your health care provider. Follow directions carefully. Blood pressure medicines must be taken as prescribed.  Do not skip doses of blood pressure medicine. Doing this puts you at risk for problems and can make the medicine less effective.  Ask your health care provider about side effects or reactions to medicines that you should watch for. Contact a health care provider if:  You think you are having a reaction to a medicine you are taking.  You have headaches that keep coming back (recurring).  You feel dizzy.  You have swelling in your ankles.  You have trouble with your vision. Get help right away if:  You develop a severe headache or confusion.  You have unusual weakness or numbness.  You feel faint.  You have severe pain in your chest or abdomen.  You vomit repeatedly.  You have trouble breathing. Summary  Hypertension is when the force of blood pumping through your arteries is too strong. If this condition is not controlled, it may put you at risk for serious complications.  Your personal target blood pressure may vary depending on your medical conditions, your age, and other factors. For most people, a normal blood pressure is less than 120/80.  Hypertension is treated with lifestyle changes, medicines, or a combination of both. Lifestyle changes include weight loss, eating a healthy, low-sodium diet, exercising more, and limiting alcohol. This information is not intended to replace advice given to you by your health care provider. Make sure you discuss any questions you have with your health care  provider. Document Released: 11/10/2005 Document Revised: 10/08/2016 Document Reviewed: 10/08/2016 Elsevier Interactive Patient Education  [employer].

## 2017-07-16 NOTE — Progress Notes (Signed)
Impression and Recommendations:    1. Refuses treatment   2. Patient refuses to take medication   3. Parent refuses immunizations   4. Colonoscopy refused   5. Other fatigue   6. Memory changes   7. Community acquired pneumonia of right middle lobe of lung ([place])   8. Screening cholesterol level   9. Glucose tolerance decreased   10. History of cigarette smoking   11. Refuses Healthcare maintenance   12. Overweight (BMI 25.0-29.9)    Please check your blood pressure at [facility] and write it down and bring in next office visit so that we can review it.   Please note that high blood pressure that's been uncontrolled for a period of time can lead to heart attacks and strokes and untimely death if not controlled.   - Patient refuses medicine today.  She did have some questions as to what can be causing her high blood pressure though.   Patient wishes to be called regarding her lab results and not have a follow-up.  Patient declines repeat chest x-ray to see resolution of pneumonia that she had approximately one month ago.  If you change your mind please let me know.   No problem-specific Assessment & Plan notes found for this encounter.   The patient was counseled, risk factors were discussed, anticipatory guidance given.   New Prescriptions   No medications on file     Discontinued Medications   No medications on file     Modified Medications   No medications on file      Orders Placed This Encounter  Procedures  . Comprehensive metabolic panel  . Hemoglobin A1c  . Lipid panel  . VITAMIN D 25 Hydroxy (Vit-D Deficiency, Fractures)  . TSH  . T4, free  . Vitamin B12  . Hepatitis C Antibody     Gross side effects, risk and benefits, and alternatives of medications and treatment plan in general discussed with patient.  Patient is aware that all medications have potential side effects and we are unable to predict every side effect or drug-drug interaction  that may occur.   Patient will call with any questions prior to using medication if they have concerns.  Expresses verbal understanding and consents to current therapy and treatment regimen.  No barriers to understanding were identified.  Red flag symptoms and signs discussed in detail.  Patient expressed understanding regarding what to do in case of emergency\urgent symptoms  Please see AVS handed out to patient at the end of our visit for further patient instructions/ counseling done pertaining to today's office visit.   Return in about 4 months (around 11/15/2017) for BP- bring in BP log, and any other findings on labs.     Note: This document was prepared using Dragon voice recognition software and may include unintentional dictation errors.  Mellody Dance 10:51 AM --------------------------------------------------------------------------------------------------------------------------------------------------------------------------------------------------------------------------------------------    Subjective:    CC:  Chief Complaint  Patient presents with  . Follow-up    HPI: Peggy Howard is a 83 y.o. female who presents to [place] at [facility] today for issues as discussed below.  Pt walks around her house for 25min daily with wts.     Son lives with her.  Eats grilled chicken and fish/ salmon but no other meats.  ONce wkly - eats pizza.   PNA- Patient was seen in the ED on 06/17/2017 and diagnosed with pneumonia.  At that time chest x-ray was positive  for pneumonia and was recommended to repeat in 3-4 weeks.  Patient declines repeat x-ray today.  Problem  Colonoscopy Refused  Refuses Treatment  Parent Refuses Immunizations  Patient Refuses to Take Medication     Wt Readings from Last 3 Encounters:  07/16/17 167 lb 8 oz (76 kg)  06/26/17 171 lb 4.8 oz (77.7 kg)  06/18/17 177 lb 1.6 oz (80.3 kg)   BP Readings from Last 3 Encounters:    07/16/17 (!) 162/80  06/26/17 112/63  06/18/17 (!) 148/85   Pulse Readings from Last 3 Encounters:  07/16/17 76  06/26/17 (!) 56  06/18/17 (!) 103   BMI Readings from Last 3 Encounters:  07/16/17 28.75 kg/m  06/26/17 29.40 kg/m  06/18/17 29.47 kg/m     Patient Care Team    Relationship Specialty Notifications Start End  Mellody Dance, DO PCP - General Family Medicine  06/26/17      Patient Active Problem List   Diagnosis Date Noted  . Overweight (BMI 25.0-29.9) 07/16/2017  . Colonoscopy refused 07/16/2017  . Refuses treatment 07/16/2017  . Parent refuses immunizations 07/16/2017  . Patient refuses to take medication 07/16/2017  . History of cigarette smoking 06/26/2017  . Other fatigue 06/26/2017  . Memory changes 06/26/2017  . Community acquired pneumonia of right middle lobe of lung ([place])   . External hemorrhoids 01/26/2008    Past Medical history, Surgical history, Family history, Social history, Allergies and Medications have been entered into the medical record, reviewed and changed as needed.    No outpatient prescriptions have been marked as taking for the 07/16/17 encounter (Office Visit) with Mellody Dance, DO.    Allergies:  Allergies  Allergen Reactions  . Sulfamethoxazole-Trimethoprim     REACTION: causes nausea sweats     Review of Systems: General:   Denies fever, chills, unexplained weight loss.  Optho/Auditory:   Denies visual changes, blurred vision/LOV Respiratory:   Denies wheeze, DOE more than baseline levels.  Cardiovascular:   Denies chest pain, palpitations, new onset peripheral edema  Gastrointestinal:   Denies nausea, vomiting, diarrhea, abd pain.  Genitourinary: Denies dysuria, freq/ urgency, flank pain or discharge from genitals.  Endocrine:     Denies hot or cold intolerance, polyuria, polydipsia. Musculoskeletal:   Denies unexplained myalgias, joint swelling, unexplained arthralgias, gait problems.  Skin:  Denies new  onset rash, suspicious lesions Neurological:     Denies dizziness, unexplained weakness, numbness  Psychiatric/Behavioral:   Denies mood changes, suicidal or homicidal ideations, hallucinations    Objective:   Blood pressure (!) 162/80, pulse 76, height 5\' 4"  (1.626 m), weight 167 lb 8 oz (76 kg). Body mass index is 28.75 kg/m. General:  Well Developed, well nourished, appropriate for stated age.  Neuro:  Alert and oriented,  extra-ocular muscles intact  HEENT:  Normocephalic, atraumatic, neck supple, no carotid bruits appreciated  Skin:  no gross rash, warm, pink. Cardiac:  RRR, S1 S2 Respiratory:  ECTA B/L and A/P, Not using accessory muscles, speaking in full sentences- unlabored. Vascular:  Ext warm, no cyanosis apprec.; cap RF less 2 sec. Psych:  No HI/SI, judgement and insight good, Euthymic mood. Full Affect.

## 2017-07-17 LAB — COMPREHENSIVE METABOLIC PANEL
ALT: 13 IU/L (ref 0–32)
AST: 16 IU/L (ref 0–40)
Albumin/Globulin Ratio: 1.9 (ref 1.2–2.2)
Albumin: 4.4 g/dL (ref 3.5–4.7)
Alkaline Phosphatase: 65 IU/L (ref 39–117)
BUN/Creatinine Ratio: 18 (ref 12–28)
BUN: 14 mg/dL (ref 8–27)
Bilirubin Total: 0.9 mg/dL (ref 0.0–1.2)
CALCIUM: 9.4 mg/dL (ref 8.7–10.3)
CHLORIDE: 103 mmol/L (ref 96–106)
CO2: 26 mmol/L (ref 20–29)
Creatinine, Ser: 0.79 mg/dL (ref 0.57–1.00)
GFR, EST AFRICAN AMERICAN: 81 mL/min/{1.73_m2} (ref >59)
GFR, EST NON AFRICAN AMERICAN: 70 mL/min/{1.73_m2} (ref >59)
GLUCOSE: 131 mg/dL — AB (ref 65–99)
Globulin, Total: 2.3 g/dL (ref 1.5–4.5)
Potassium: 4.4 mmol/L (ref 3.5–5.2)
Sodium: 144 mmol/L (ref 134–144)
TOTAL PROTEIN: 6.7 g/dL (ref 6.0–8.5)

## 2017-07-17 LAB — VITAMIN B12: VITAMIN B 12: 891 pg/mL (ref 232–1245)

## 2017-07-17 LAB — LIPID PANEL
CHOLESTEROL TOTAL: 224 mg/dL — AB (ref 100–199)
Chol/HDL Ratio: 2.9 ratio (ref 0.0–4.4)
HDL: 76 mg/dL (ref >39)
LDL CALC: 130 mg/dL — AB (ref 0–99)
Triglycerides: 88 mg/dL (ref 0–149)
VLDL CHOLESTEROL CAL: 18 mg/dL (ref 5–40)

## 2017-07-17 LAB — TSH: TSH: 5.62 u[IU]/mL — ABNORMAL HIGH (ref 0.450–4.500)

## 2017-07-17 LAB — HEMOGLOBIN A1C
ESTIMATED AVERAGE GLUCOSE: 131 mg/dL
Hgb A1c MFr Bld: 6.2 % — ABNORMAL HIGH (ref 4.8–5.6)

## 2017-07-17 LAB — HEPATITIS C ANTIBODY: Hep C Virus Ab: <0.1 s/co ratio (ref 0.0–0.9)

## 2017-07-17 LAB — T4, FREE: FREE T4: 1.01 ng/dL (ref 0.82–1.77)

## 2017-10-28 ENCOUNTER — Ambulatory Visit: Payer: Medicare Other | Admitting: Family Medicine

## 2020-12-12 ENCOUNTER — Ambulatory Visit: Payer: Medicare Other | Admitting: Physician Assistant

## 2021-03-22 ENCOUNTER — Other Ambulatory Visit: Payer: Self-pay

## 2021-03-22 ENCOUNTER — Encounter (HOSPITAL_COMMUNITY): Payer: Self-pay

## 2021-03-22 ENCOUNTER — Emergency Department (HOSPITAL_COMMUNITY)
Admission: EM | Admit: 2021-03-22 | Discharge: 2021-03-22 | Disposition: A | Discharge status: Home / Self Care | Payer: Medicare Other | Source: Home / Self Care | Attending: Emergency Medicine | Admitting: Emergency Medicine

## 2021-03-22 DIAGNOSIS — K644 Residual hemorrhoidal skin tags: Secondary | ICD-10-CM | POA: Insufficient documentation

## 2021-03-22 DIAGNOSIS — K625 Hemorrhage of anus and rectum: Secondary | ICD-10-CM | POA: Insufficient documentation

## 2021-03-22 DIAGNOSIS — K922 Gastrointestinal hemorrhage, unspecified: Secondary | ICD-10-CM | POA: Diagnosis not present

## 2021-03-22 DIAGNOSIS — K626 Ulcer of anus and rectum: Secondary | ICD-10-CM | POA: Diagnosis not present

## 2021-03-22 LAB — COMPREHENSIVE METABOLIC PANEL
ALT: 13 U/L (ref 0–44)
AST: 20 U/L (ref 15–41)
Albumin: 3.8 g/dL (ref 3.5–5.0)
Alkaline Phosphatase: 75 U/L (ref 38–126)
Anion gap: 9 (ref 5–15)
BUN: 21 mg/dL (ref 8–23)
CO2: 27 mmol/L (ref 22–32)
Calcium: 9.1 mg/dL (ref 8.9–10.3)
Chloride: 106 mmol/L (ref 98–111)
Creatinine, Ser: 0.84 mg/dL (ref 0.44–1.00)
GFR, Estimated: >60 mL/min (ref >60)
Glucose, Bld: 132 mg/dL — ABNORMAL HIGH (ref 70–99)
Potassium: 4.5 mmol/L (ref 3.5–5.1)
Sodium: 142 mmol/L (ref 135–145)
Total Bilirubin: 1 mg/dL (ref 0.3–1.2)
Total Protein: 6.6 g/dL (ref 6.5–8.1)

## 2021-03-22 LAB — CBC WITH DIFFERENTIAL/PLATELET
Abs Immature Granulocytes: 0.01 10*3/uL (ref 0.00–0.07)
Basophils Absolute: 0.1 10*3/uL (ref 0.0–0.1)
Basophils Relative: 1 %
Eosinophils Absolute: 0.1 10*3/uL (ref 0.0–0.5)
Eosinophils Relative: 1 %
HCT: 43.7 % (ref 36.0–46.0)
Hemoglobin: 14.2 g/dL (ref 12.0–15.0)
Immature Granulocytes: 0 %
Lymphocytes Relative: 23 %
Lymphs Abs: 1.6 10*3/uL (ref 0.7–4.0)
MCH: 31.4 pg (ref 26.0–34.0)
MCHC: 32.5 g/dL (ref 30.0–36.0)
MCV: 96.7 fL (ref 80.0–100.0)
Monocytes Absolute: 0.4 10*3/uL (ref 0.1–1.0)
Monocytes Relative: 6 %
Neutro Abs: 4.7 10*3/uL (ref 1.7–7.7)
Neutrophils Relative %: 69 %
Platelets: 281 10*3/uL (ref 150–400)
RBC: 4.52 MIL/uL (ref 3.87–5.11)
RDW: 12.8 % (ref 11.5–15.5)
WBC: 6.9 10*3/uL (ref 4.0–10.5)
nRBC: 0 % (ref 0.0–0.2)

## 2021-03-22 LAB — POC OCCULT BLOOD, ED: Fecal Occult Bld: NEGATIVE

## 2021-03-22 NOTE — ED Provider Notes (Signed)
[name] Provider Note   CSN: [id number] Arrival date & time: 03/22/21  0347     History Chief Complaint  Patient presents with  . Blood In Stools    Peggy Howard is a 87 y.o. female.  HPI      Peggy Howard is a 87 y.o. female, presenting to the ED with bright red blood on the paper after wiping following bowel movements for the last couple days. She has not had this before. Denies anticoagulation. Denies fever/chills, abdominal pain, nausea/vomiting, diarrhea, melena, rectal pain, constipation, chest pain, shortness of breath, dizziness, or any other complaints.    History reviewed. No pertinent past medical history.  Patient Active Problem List   Diagnosis Date Noted  . Overweight (BMI 25.0-29.9) 07/16/2017  . Colonoscopy refused 07/16/2017  . Refuses treatment 07/16/2017  . Parent refuses immunizations 07/16/2017  . Patient refuses to take medication 07/16/2017  . History of cigarette smoking 06/26/2017  . Other fatigue 06/26/2017  . Memory changes 06/26/2017  . Community acquired pneumonia of right middle lobe of lung   . External hemorrhoids 01/26/2008    Past Surgical History:  Procedure Laterality Date  . ABDOMINAL HYSTERECTOMY    . REPLACEMENT TOTAL KNEE Bilateral      OB History   No obstetric history on file.     History reviewed. No pertinent family history.  Social History   Tobacco Use  . Smoking status: Never Smoker  . Smokeless tobacco: Never Used  Vaping Use  . Vaping Use: Never used  Substance Use Topics  . Alcohol use: No  . Drug use: No    Home Medications Prior to Admission medications   Medication Sig Start Date End Date Taking? Authorizing Provider  acetaminophen (TYLENOL) 500 MG tablet Take 1,000 mg by mouth every 6 (six) hours as needed for mild pain, fever or headache.   Yes [provider]    Allergies    Patient has no known allergies.  Review of  Systems   Review of Systems  Constitutional: Negative for chills and fever.  Respiratory: Negative for shortness of breath.   Cardiovascular: Negative for chest pain.  Gastrointestinal: Positive for blood in stool. Negative for abdominal pain, constipation, diarrhea, nausea and vomiting.  Neurological: Negative for dizziness, syncope and weakness.  All other systems reviewed and are negative.   Physical Exam Updated Vital Signs BP (!) 177/87 (BP Location: Right Arm)   Pulse 72   Temp 98.3 F (36.8 C) (Oral)   Resp 18   Ht 5\' 2"  (1.575 m)   Wt 81.6 kg   SpO2 97%   BMI 32.92 kg/m   Physical Exam Vitals and nursing note reviewed. Exam conducted with a chaperone present.  Constitutional:      General: She is not in acute distress.    Appearance: She is well-developed. She is not diaphoretic.  HENT:     Head: Normocephalic and atraumatic.     Mouth/Throat:     Mouth: Mucous membranes are moist.     Pharynx: Oropharynx is clear.  Eyes:     Conjunctiva/sclera: Conjunctivae normal.  Cardiovascular:     Rate and Rhythm: Normal rate and regular rhythm.     Pulses: Normal pulses.     Heart sounds: Normal heart sounds.  Pulmonary:     Effort: Pulmonary effort is normal. No respiratory distress.     Breath sounds: Normal breath sounds.  Abdominal:     Palpations: Abdomen is soft.  Tenderness: There is no abdominal tenderness. There is no guarding.  Genitourinary:    Rectum: Guaiac result negative. External hemorrhoid present.     Comments: Rectal Exam:  External hemorrhoids noted without thrombosis, tenderness, or bleeding. No fissures or lesions noted.  No frank blood or melena. No stool burden.  No rectal tenderness. No foreign bodies noted.   Musculoskeletal:     Cervical back: Neck supple.  Skin:    General: Skin is warm and dry.  Neurological:     Mental Status: She is alert.  Psychiatric:        Mood and Affect: Mood and affect normal.        Speech: Speech  normal.        Behavior: Behavior normal.     ED Results / Procedures / Treatments   Labs (all labs ordered are listed, but only abnormal results are displayed) Labs Reviewed  COMPREHENSIVE METABOLIC PANEL - Abnormal; Notable for the following components:      Result Value   Glucose, Bld 132 (*)    All other components within normal limits  CBC WITH DIFFERENTIAL/PLATELET  POC OCCULT BLOOD, ED    EKG None  Radiology No results found.  Procedures Procedures   Medications Ordered in ED Medications - No data to display  ED Course  I have reviewed the triage vital signs and the nursing notes.  Pertinent labs & imaging results that were available during my care of the patient were reviewed by me and considered in my medical decision making (see chart for details).    MDM Rules/Calculators/A&P                          Patient presents with bright red blood per rectum on the paper following bowel movements over the last couple days. Patient is nontoxic appearing, afebrile, not tachycardic, not tachypneic, not hypotensive, maintains excellent SPO2 on room air, and is in no apparent distress.   I have reviewed the patient's chart to obtain more information.   I reviewed and interpreted the patient's labs. Lab results reassuring.  Hemoccult negative.  No frank blood on exam. GI follow-up.  Referral given. Return precautions discussed.  Patient voices understanding of these instructions, accepts the plan, and is comfortable with discharge.   Findings and plan of care discussed with attending physician, Madalyn Rob, MD. Dr. Roslynn Amble personally evaluated and examined this patient.  Vitals:   03/22/21 1115 03/22/21 1130 03/22/21 1145 03/22/21 1230  BP:   133/72 (!) 156/78  Pulse: 61 (!) 58 62 66  Resp:   16   Temp:      TempSrc:      SpO2: 94% 100% 95% 94%  Weight:      Height:        Final Clinical Impression(s) / ED Diagnoses Final diagnoses:  Bright red blood  per rectum    Rx / DC Orders ED Discharge Orders    None       [name] 03/23/21 1024    Lucrezia Starch, MD 03/24/21 [id number]

## 2021-03-22 NOTE — ED Triage Notes (Signed)
Pt arrived via POV, c/o bright red blood in stools this morning. Denies any other sx.

## 2021-03-22 NOTE — Discharge Instructions (Signed)
Lab work today was reassuring.  Recommend close follow-up with primary care provider or gastroenterology on this matter.  Return to the emergency department should you begin to have abdominal pain, fever, increasing blood in stools, dizziness, chest pain, shortness of breath, passing out, or any other major concerns.

## 2021-03-25 ENCOUNTER — Inpatient Hospital Stay (HOSPITAL_COMMUNITY)
Admission: EM | Admit: 2021-03-25 | Discharge: 2021-03-28 | DRG: 394 | Disposition: A | Discharge status: Home / Self Care | Payer: Medicare Other | Attending: Internal Medicine | Admitting: Internal Medicine

## 2021-03-25 ENCOUNTER — Encounter (HOSPITAL_COMMUNITY): Payer: Self-pay | Admitting: Emergency Medicine

## 2021-03-25 ENCOUNTER — Inpatient Hospital Stay (HOSPITAL_COMMUNITY): Payer: Medicare Other

## 2021-03-25 ENCOUNTER — Other Ambulatory Visit: Payer: Self-pay

## 2021-03-25 ENCOUNTER — Emergency Department (HOSPITAL_COMMUNITY): Payer: Medicare Other

## 2021-03-25 DIAGNOSIS — K644 Residual hemorrhoidal skin tags: Secondary | ICD-10-CM | POA: Diagnosis present

## 2021-03-25 DIAGNOSIS — R918 Other nonspecific abnormal finding of lung field: Secondary | ICD-10-CM | POA: Diagnosis present

## 2021-03-25 DIAGNOSIS — K626 Ulcer of anus and rectum: Secondary | ICD-10-CM | POA: Diagnosis present

## 2021-03-25 DIAGNOSIS — G8929 Other chronic pain: Secondary | ICD-10-CM | POA: Diagnosis not present

## 2021-03-25 DIAGNOSIS — M549 Dorsalgia, unspecified: Secondary | ICD-10-CM

## 2021-03-25 DIAGNOSIS — R1030 Lower abdominal pain, unspecified: Secondary | ICD-10-CM

## 2021-03-25 DIAGNOSIS — K641 Second degree hemorrhoids: Secondary | ICD-10-CM | POA: Diagnosis present

## 2021-03-25 DIAGNOSIS — K921 Melena: Secondary | ICD-10-CM | POA: Diagnosis present

## 2021-03-25 DIAGNOSIS — K8020 Calculus of gallbladder without cholecystitis without obstruction: Secondary | ICD-10-CM | POA: Diagnosis present

## 2021-03-25 DIAGNOSIS — I700 Atherosclerosis of aorta: Secondary | ICD-10-CM | POA: Diagnosis present

## 2021-03-25 DIAGNOSIS — Z96653 Presence of artificial knee joint, bilateral: Secondary | ICD-10-CM | POA: Diagnosis present

## 2021-03-25 DIAGNOSIS — K5730 Diverticulosis of large intestine without perforation or abscess without bleeding: Secondary | ICD-10-CM | POA: Diagnosis present

## 2021-03-25 DIAGNOSIS — K623 Rectal prolapse: Secondary | ICD-10-CM | POA: Diagnosis not present

## 2021-03-25 DIAGNOSIS — K635 Polyp of colon: Secondary | ICD-10-CM | POA: Diagnosis present

## 2021-03-25 DIAGNOSIS — R1084 Generalized abdominal pain: Secondary | ICD-10-CM | POA: Diagnosis not present

## 2021-03-25 DIAGNOSIS — M545 Low back pain, unspecified: Secondary | ICD-10-CM

## 2021-03-25 DIAGNOSIS — K922 Gastrointestinal hemorrhage, unspecified: Secondary | ICD-10-CM | POA: Diagnosis present

## 2021-03-25 DIAGNOSIS — K5790 Diverticulosis of intestine, part unspecified, without perforation or abscess without bleeding: Secondary | ICD-10-CM | POA: Diagnosis not present

## 2021-03-25 DIAGNOSIS — K625 Hemorrhage of anus and rectum: Secondary | ICD-10-CM

## 2021-03-25 DIAGNOSIS — Z20822 Contact with and (suspected) exposure to covid-19: Secondary | ICD-10-CM | POA: Diagnosis present

## 2021-03-25 DIAGNOSIS — Z888 Allergy status to other drugs, medicaments and biological substances status: Secondary | ICD-10-CM | POA: Diagnosis not present

## 2021-03-25 DIAGNOSIS — Z90710 Acquired absence of both cervix and uterus: Secondary | ICD-10-CM

## 2021-03-25 DIAGNOSIS — D490 Neoplasm of unspecified behavior of digestive system: Secondary | ICD-10-CM | POA: Diagnosis not present

## 2021-03-25 DIAGNOSIS — R159 Full incontinence of feces: Secondary | ICD-10-CM | POA: Diagnosis present

## 2021-03-25 DIAGNOSIS — R933 Abnormal findings on diagnostic imaging of other parts of digestive tract: Secondary | ICD-10-CM | POA: Diagnosis not present

## 2021-03-25 DIAGNOSIS — R109 Unspecified abdominal pain: Secondary | ICD-10-CM | POA: Insufficient documentation

## 2021-03-25 LAB — CBC WITH DIFFERENTIAL/PLATELET
Abs Immature Granulocytes: 0.02 10*3/uL (ref 0.00–0.07)
Basophils Absolute: 0 10*3/uL (ref 0.0–0.1)
Basophils Relative: 1 %
Eosinophils Absolute: 0 10*3/uL (ref 0.0–0.5)
Eosinophils Relative: 1 %
HCT: 43.6 % (ref 36.0–46.0)
Hemoglobin: 14.2 g/dL (ref 12.0–15.0)
Immature Granulocytes: 0 %
Lymphocytes Relative: 10 %
Lymphs Abs: 0.5 10*3/uL — ABNORMAL LOW (ref 0.7–4.0)
MCH: 31.3 pg (ref 26.0–34.0)
MCHC: 32.6 g/dL (ref 30.0–36.0)
MCV: 96 fL (ref 80.0–100.0)
Monocytes Absolute: 0.6 10*3/uL (ref 0.1–1.0)
Monocytes Relative: 11 %
Neutro Abs: 4.1 10*3/uL (ref 1.7–7.7)
Neutrophils Relative %: 77 %
Platelets: 277 10*3/uL (ref 150–400)
RBC: 4.54 MIL/uL (ref 3.87–5.11)
RDW: 12.8 % (ref 11.5–15.5)
WBC: 5.2 10*3/uL (ref 4.0–10.5)
nRBC: 0 % (ref 0.0–0.2)

## 2021-03-25 LAB — COMPREHENSIVE METABOLIC PANEL
ALT: 14 U/L (ref 0–44)
AST: 26 U/L (ref 15–41)
Albumin: 4.2 g/dL (ref 3.5–5.0)
Alkaline Phosphatase: 78 U/L (ref 38–126)
Anion gap: 9 (ref 5–15)
BUN: 17 mg/dL (ref 8–23)
CO2: 26 mmol/L (ref 22–32)
Calcium: 9.2 mg/dL (ref 8.9–10.3)
Chloride: 103 mmol/L (ref 98–111)
Creatinine, Ser: 0.95 mg/dL (ref 0.44–1.00)
GFR, Estimated: 59 mL/min — ABNORMAL LOW (ref >60)
Glucose, Bld: 112 mg/dL — ABNORMAL HIGH (ref 70–99)
Potassium: 4.3 mmol/L (ref 3.5–5.1)
Sodium: 138 mmol/L (ref 135–145)
Total Bilirubin: 0.8 mg/dL (ref 0.3–1.2)
Total Protein: 7.4 g/dL (ref 6.5–8.1)

## 2021-03-25 LAB — URINALYSIS, ROUTINE W REFLEX MICROSCOPIC
Bacteria, UA: NONE SEEN
Bilirubin Urine: NEGATIVE
Glucose, UA: NEGATIVE mg/dL
Ketones, ur: NEGATIVE mg/dL
Leukocytes,Ua: NEGATIVE
Nitrite: NEGATIVE
Protein, ur: NEGATIVE mg/dL
Specific Gravity, Urine: 1.027 (ref 1.005–1.030)
pH: 6 (ref 5.0–8.0)

## 2021-03-25 LAB — TYPE AND SCREEN
ABO/RH(D): O POS
Antibody Screen: NEGATIVE

## 2021-03-25 LAB — POC OCCULT BLOOD, ED: Fecal Occult Bld: POSITIVE — AB

## 2021-03-25 LAB — HEMOGLOBIN AND HEMATOCRIT, BLOOD
HCT: 40.8 % (ref 36.0–46.0)
Hemoglobin: 13.3 g/dL (ref 12.0–15.0)

## 2021-03-25 LAB — LIPASE, BLOOD: Lipase: 50 U/L (ref 11–51)

## 2021-03-25 IMAGING — CT CT ABD-PELV W/ CM
2 of 5 series · 16 of 46 positions shown, 18 images · IV contrast (omnipaque)
Comparison: None.

CLINICAL DATA: Abdominal distension.  Rectal bleeding.

EXAM:
CT ABDOMEN AND PELVIS WITH CONTRAST
TECHNIQUE: Multidetector CT imaging of the abdomen and pelvis was performed
using the standard protocol following bolus administration of
intravenous contrast.
CONTRAST:  <See Chart> OMNIPAQUE IOHEXOL 300 MG/ML  SOLN

[Series 2: axial st · axial · 0.87mm/px · z∈[-541,-156]mm · 13 of 89 slices shown, 15 images]
[im 6/89  soft-tissue]
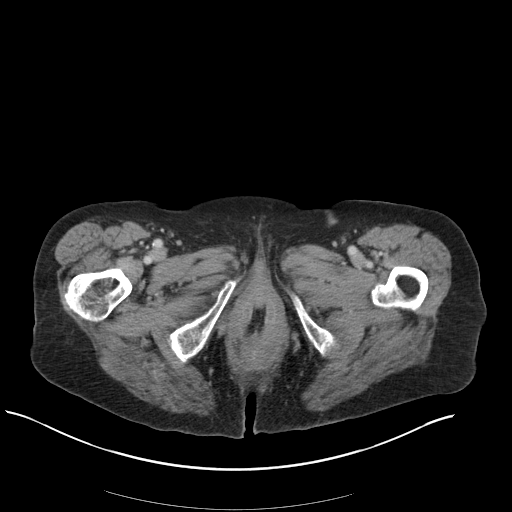
[im 6/89  bone]
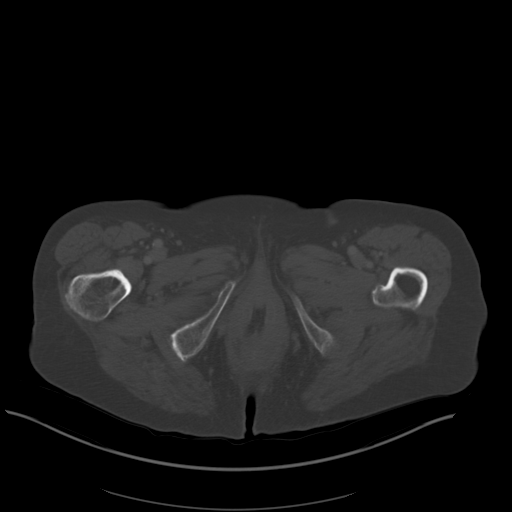
[im 12/89  soft-tissue]
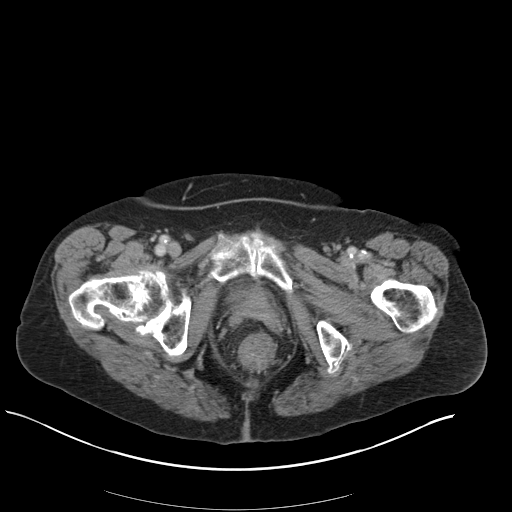
[im 18/89  soft-tissue]
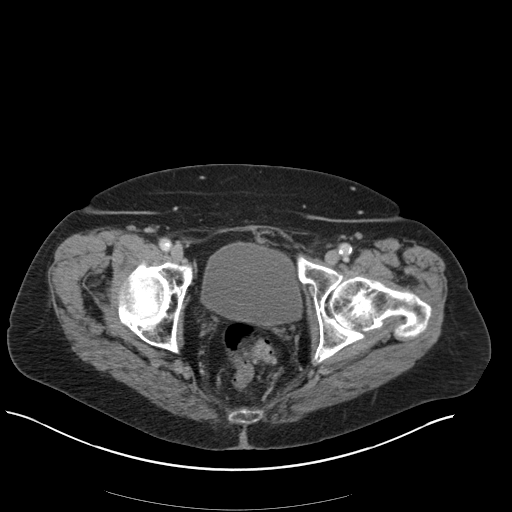
[im 24/89  soft-tissue]
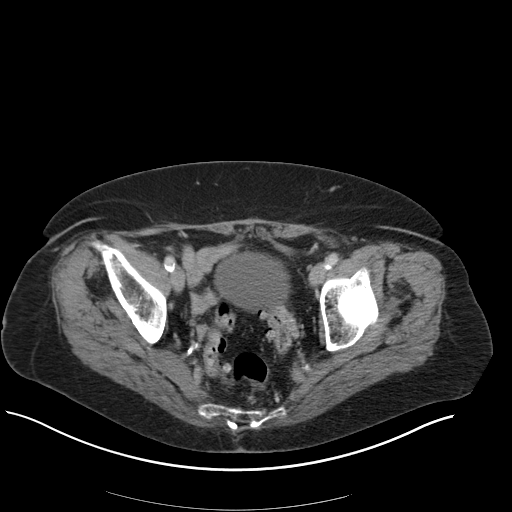
[im 30/89  soft-tissue]
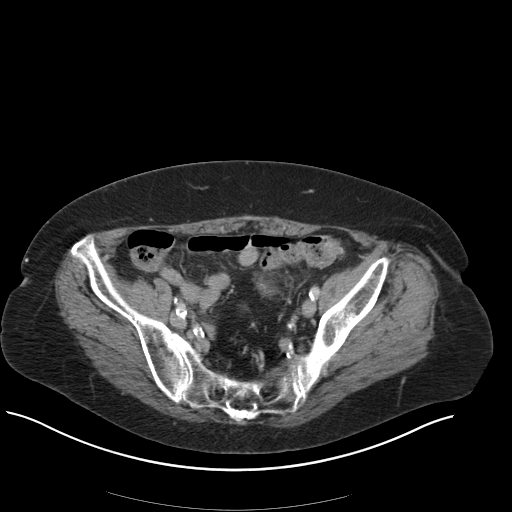
[im 36/89  soft-tissue]
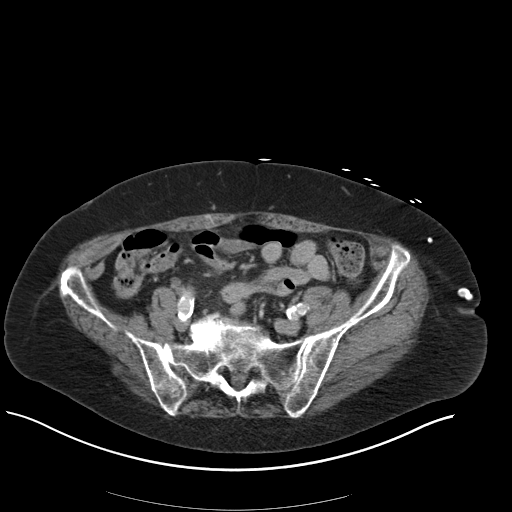
[im 47/89  soft-tissue]
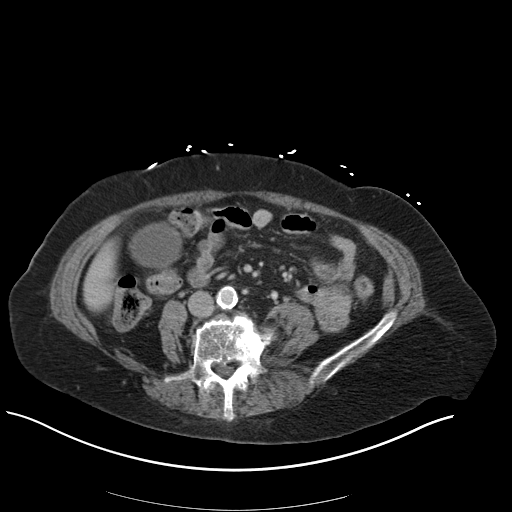
[im 53/89  soft-tissue]
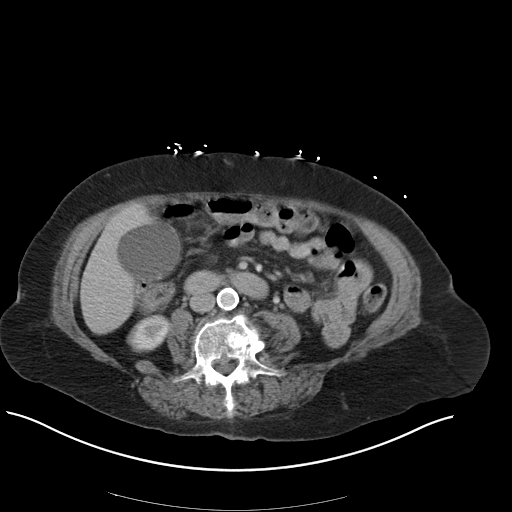
[im 59/89  soft-tissue]
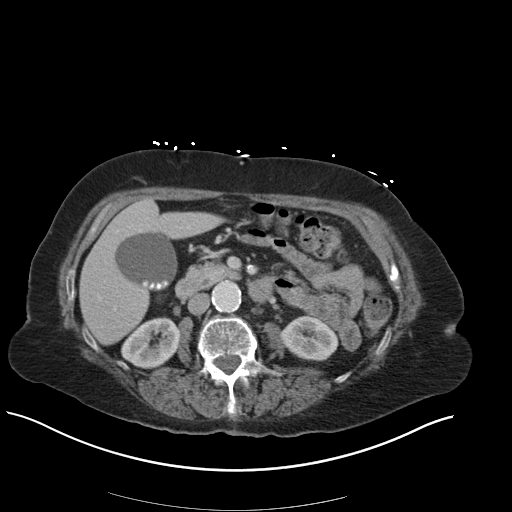
[im 59/89  bone]
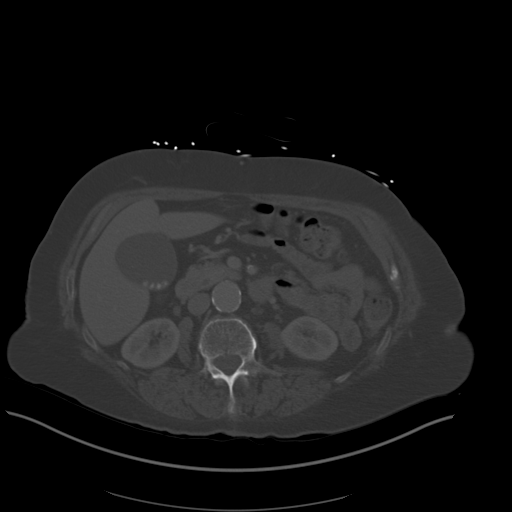
[im 65/89  soft-tissue]
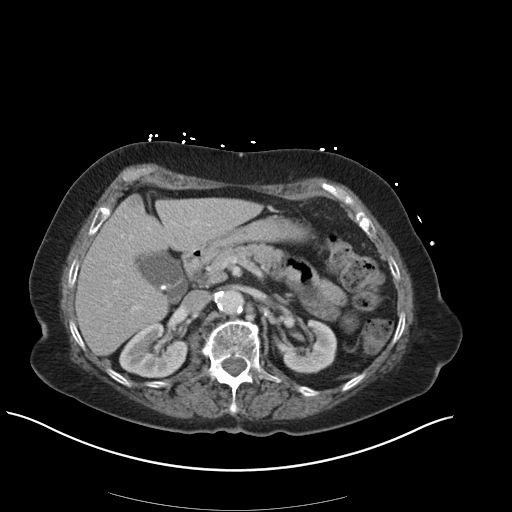
[im 71/89  soft-tissue]
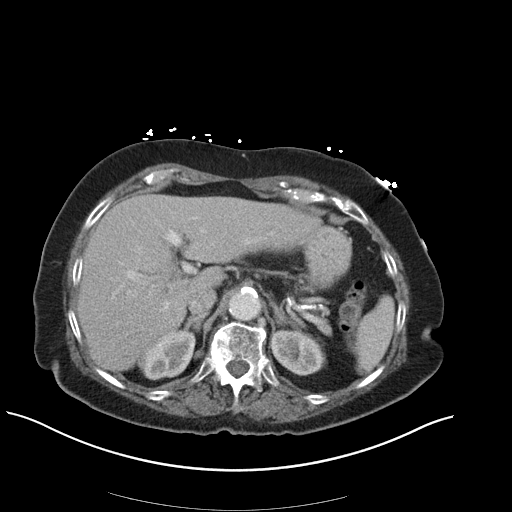
[im 77/89  soft-tissue]
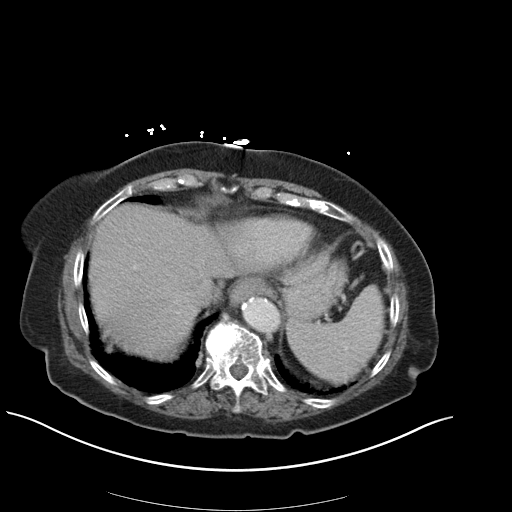
[im 83/89  soft-tissue]
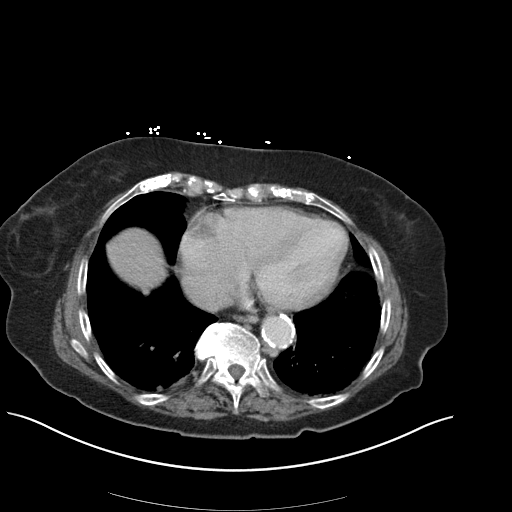

[Series 5: coronal st · coronal · 0.69mm/px · 3 of 136 slices shown]
[im 46/136  soft-tissue]
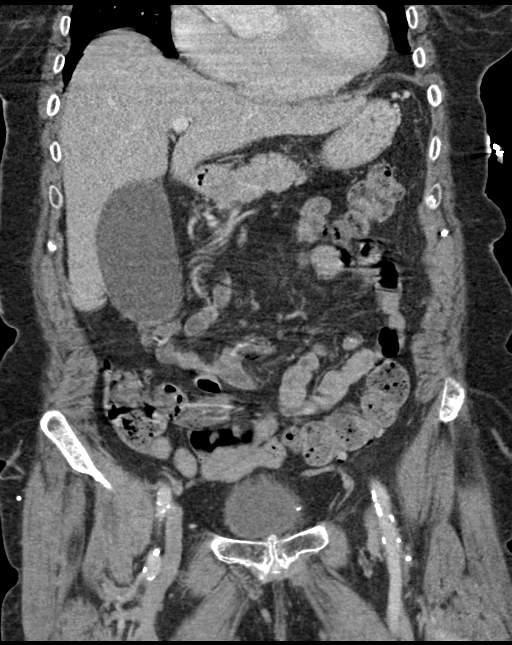
[im 61/136  soft-tissue]
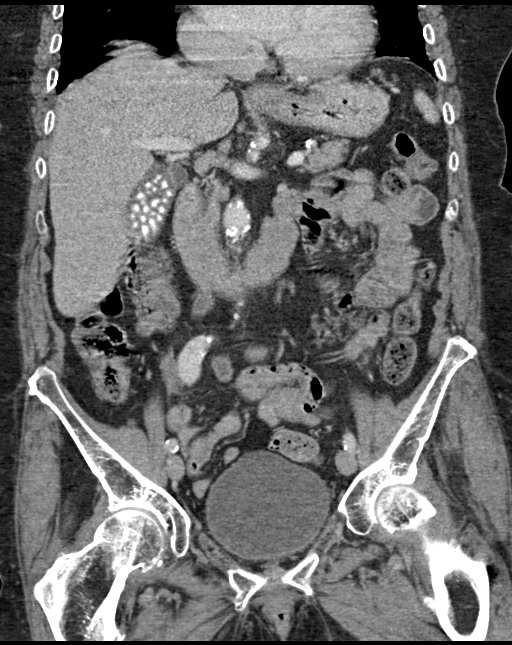
[im 76/136  soft-tissue]
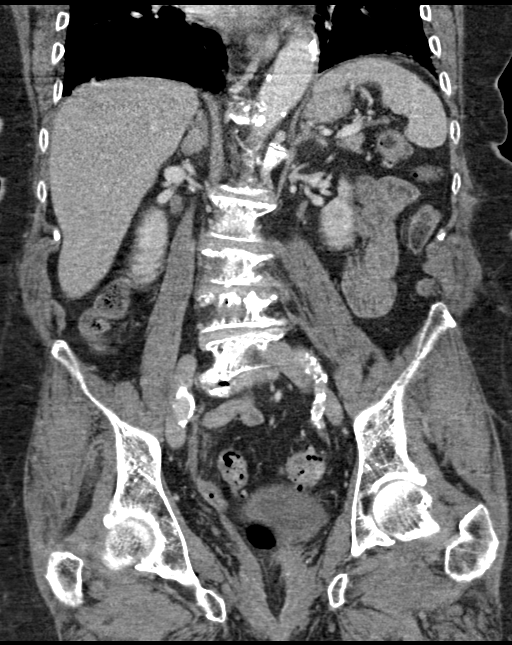

[16 of 46 positions shown; findings below may reference images not displayed]

FINDINGS: Lower chest: 4 mm nodule in the RIGHT middle lobe (image 4/ 4).
Several additional nodules over the RIGHT hemidiaphragm (image [DATE]
and 36/4 )

Hepatobiliary: Small hypodense lesion in the LEFT lateral hepatic
lobe measuring 3 mm cannot be fully characterize. No biliary duct
dilatation. Gallbladder is distended to 4.3 cm. There several
dependent gallstones. No evidence acute inflammation.

Pancreas: Pancreas is normal. No ductal dilatation. No pancreatic
inflammation.

Spleen: Normal spleen

Adrenals/urinary tract: Adrenal glands, kidneys and bladder normal.

Stomach/Bowel:

Stomach, small-bowel and cecum are normal. The appendix is not
identified but there is no pericecal inflammation to suggest
appendicitis. Multiple diverticula of the descending colon and
sigmoid colon without acute inflammation.

There is nonspecific thickening through a short segment of rectum
with with luminal collapse on image 79/2. Region measures 3 cm on
sagittal image 103/series 6 . No mesorectal adenopathy

Vascular/Lymphatic: Abdominal aorta is normal caliber. No periportal
or retroperitoneal adenopathy. No pelvic adenopathy.

Reproductive: Post hysterectomy.  Adnexa unremarkable

Other: No free fluid.

Musculoskeletal: No aggressive osseous lesion.
IMPRESSION: 1. Short segment of rectal wall thickening and luminal collapse.
Cannot exclude a rectal mucosal lesion. Consider flexible
sigmoidoscopy for further evaluation.
2. Multiple pulmonary nodules at the RIGHT lung base which are
predominately pleural base which is typically a benign
characteristic. Lesions may need further evaluation in patient with
potential rectal lesion.
3. LEFT colon diverticulosis without diverticulitis.
4. Multiple gallstones without evidence acute cholecystitis.
5.  Aortic Atherosclerosis ([BS]-[BS]).

## 2021-03-25 IMAGING — CR DG LUMBAR SPINE 2-3V
3 series · 3 of 3 positions shown · non-contrast
Comparison: None.

CLINICAL DATA: Back pain with no known injury

EXAM:
LUMBAR SPINE - 2-3 VIEW

[t lumbar spine ap]
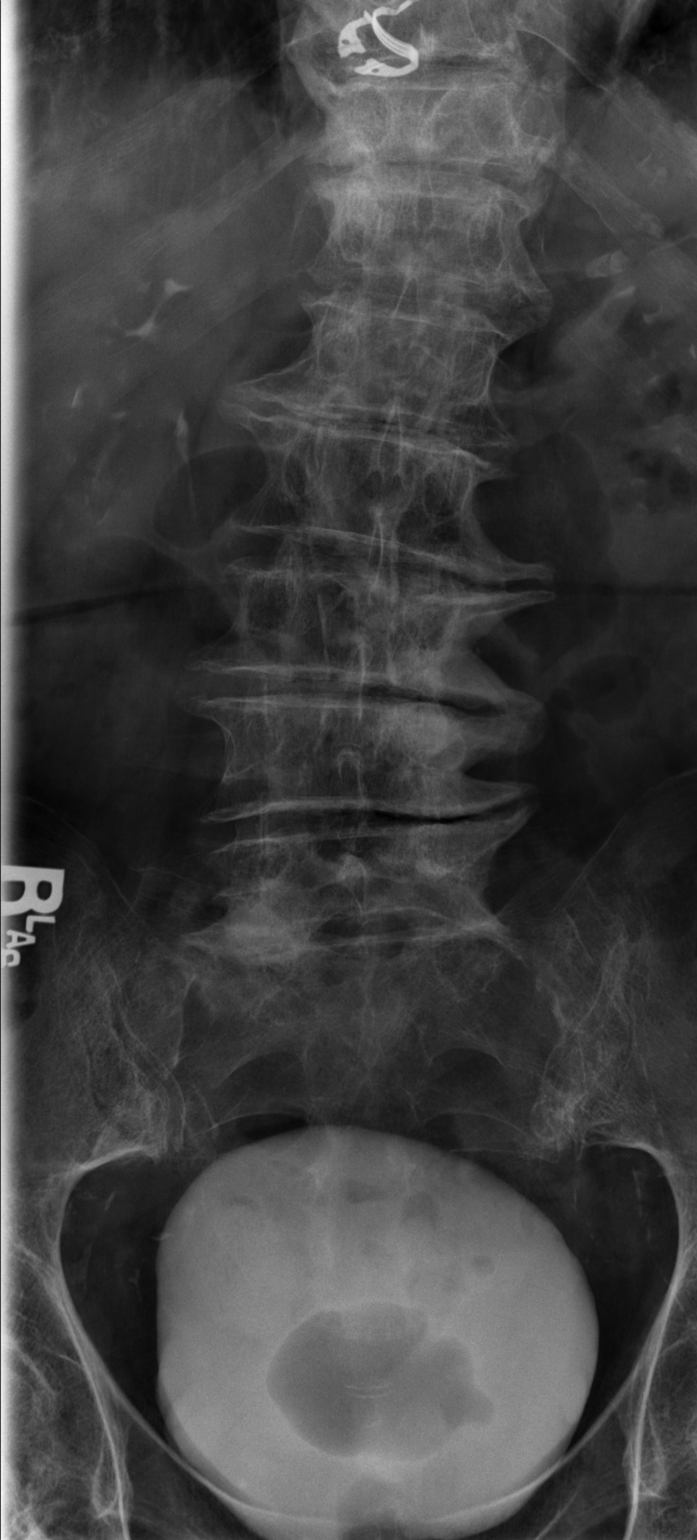

[t lumbar spine lat]
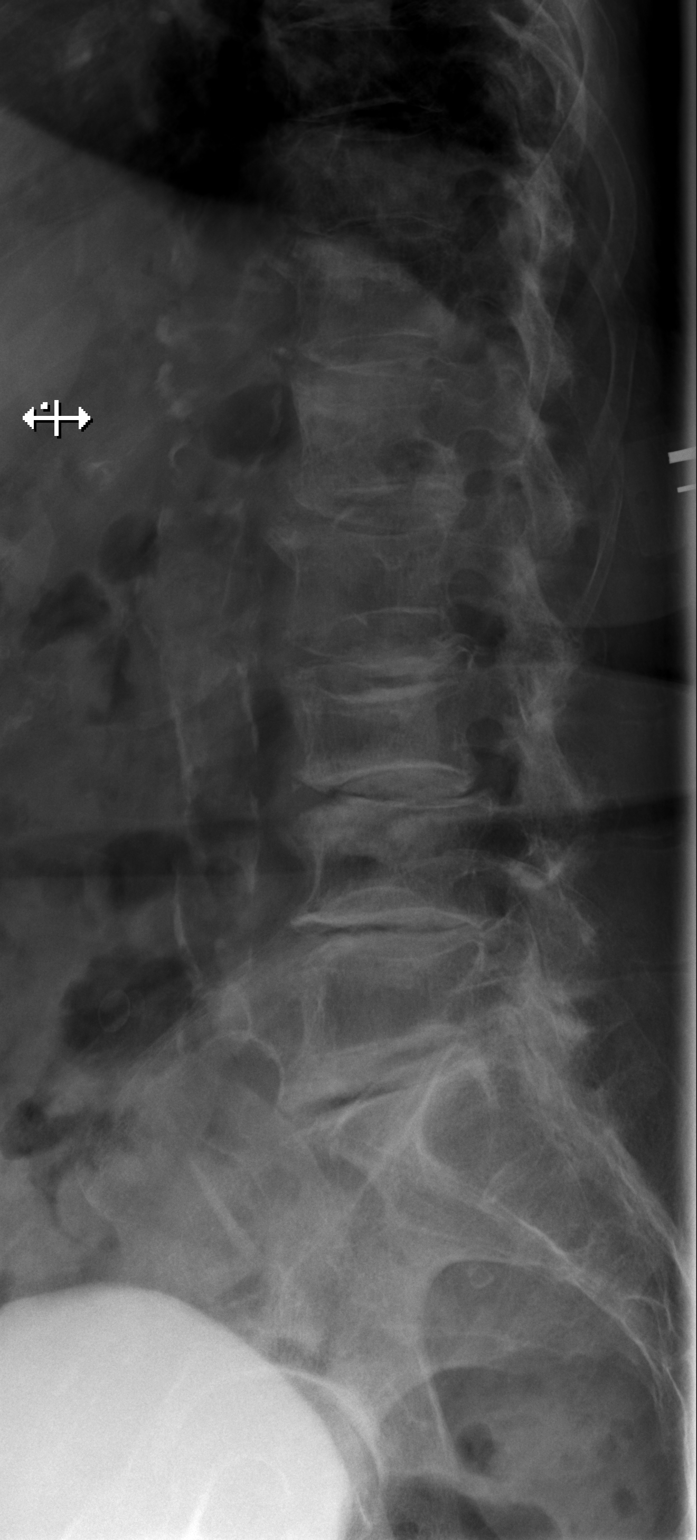

[t lumbar l-5 s-1 spot]
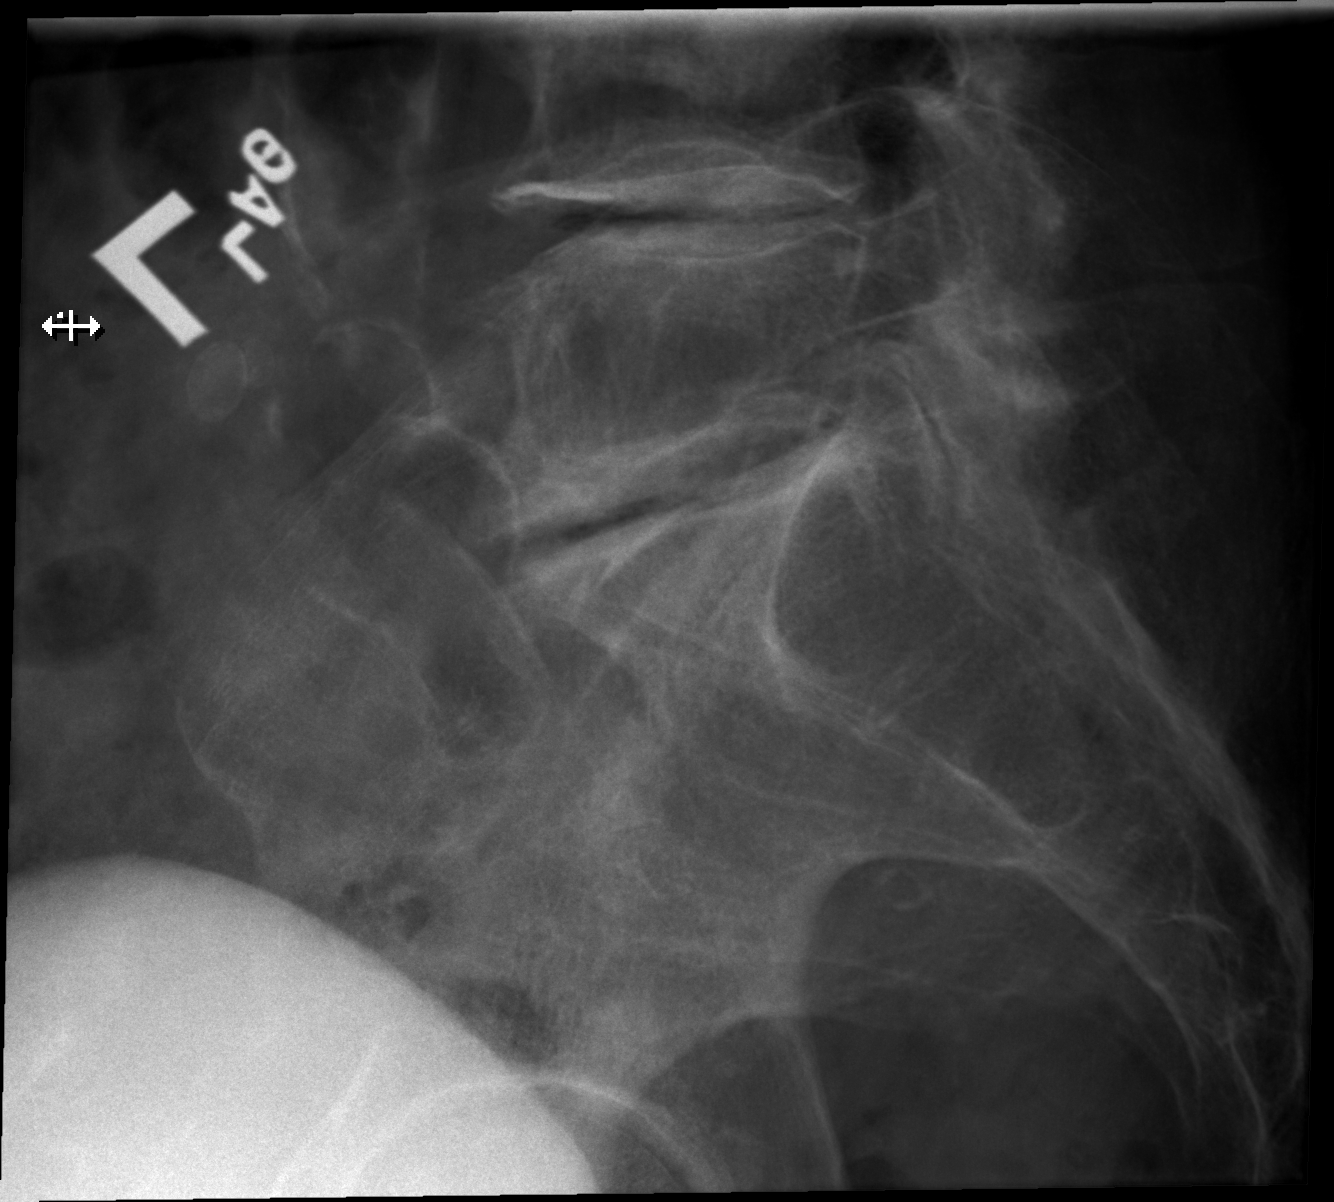

[3 of 3 positions shown; findings below may reference images not displayed]

FINDINGS: There are advanced multi level degenerative changes throughout the
lumbar spine with multilevel severe disc height loss and facet
arthrosis. There is no definite acute compression fracture. Aortic
calcifications are noted.
IMPRESSION: Advanced degenerative changes of the lumbar spine without evidence
for an acute osseous abnormality.

## 2021-03-25 MED ORDER — SODIUM CHLORIDE 0.9% FLUSH
3.0000 mL | Freq: Two times a day (BID) | INTRAVENOUS | Status: DC
  Administered 2021-03-26 – 2021-03-28 (×5): 3 mL via INTRAVENOUS

## 2021-03-25 MED ORDER — LACTATED RINGERS IV SOLN: INTRAVENOUS | Status: DC

## 2021-03-25 MED ORDER — IOHEXOL 300 MG/ML  SOLN
100.0000 mL | Freq: Once | INTRAMUSCULAR | Status: AC | PRN
  Administered 2021-03-25: 100 mL via INTRAVENOUS

## 2021-03-25 MED ORDER — SODIUM CHLORIDE 0.9 % IV SOLN: INTRAVENOUS | Status: AC

## 2021-03-25 MED ORDER — ACETAMINOPHEN 500 MG PO TABS
1000.0000 mg | ORAL_TABLET | Freq: Once | ORAL | Status: AC
  Administered 2021-03-25: 1000 mg via ORAL
  Filled 2021-03-25: qty 2

## 2021-03-25 MED ORDER — ONDANSETRON HCL 4 MG PO TABS: 4.0000 mg | ORAL_TABLET | Freq: Four times a day (QID) | ORAL | Status: DC | PRN

## 2021-03-25 MED ORDER — ONDANSETRON HCL 4 MG/2ML IJ SOLN: 4.0000 mg | Freq: Four times a day (QID) | INTRAMUSCULAR | Status: DC | PRN

## 2021-03-25 MED ORDER — ACETAMINOPHEN 325 MG PO TABS
650.0000 mg | ORAL_TABLET | Freq: Four times a day (QID) | ORAL | Status: DC | PRN
  Administered 2021-03-26: 650 mg via ORAL
  Filled 2021-03-25: qty 2

## 2021-03-25 MED ORDER — ACETAMINOPHEN 650 MG RE SUPP: 650.0000 mg | Freq: Four times a day (QID) | RECTAL | Status: DC | PRN

## 2021-03-25 MED ORDER — PANTOPRAZOLE SODIUM 40 MG IV SOLR
40.0000 mg | Freq: Two times a day (BID) | INTRAVENOUS | Status: DC
  Administered 2021-03-25 – 2021-03-28 (×6): 40 mg via INTRAVENOUS
  Filled 2021-03-25 (×6): qty 40

## 2021-03-25 NOTE — ED Notes (Signed)
Dr Johnney Killian at the bedside for exam- rectal exam done

## 2021-03-25 NOTE — ED Notes (Signed)
Update given to Peggy Howard, Daughter in [place].

## 2021-03-25 NOTE — ED Triage Notes (Signed)
Emergency Medicine Provider Triage Evaluation Note  Peggy Howard , a 87 y.o. female  was evaluated in triage.  Pt complains of ongoing rectal bleeding with diarrhea (only with eating, has since stopped eating), lower abdominal pain. Not anticoagulated, seen here for same last week, told bleeding hemorrhoid.  Review of Systems  Positive: Rectal bleeding, abdominal pain, nausea Negative: Vomiting, fever  Physical Exam  BP (!) 155/84   Pulse 93   Temp 98.9 F (37.2 C) (Oral)   Resp 20   Ht 5\' 2"  (1.575 m)   Wt 81.6 kg   SpO2 94%   BMI 32.92 kg/m  Gen:   Awake, no distress   HEENT:  Atraumatic  Resp:  Normal effort  Cardiac:  Normal rate  Abd:   Nondistended, diffuse tenderness MSK:   Moves extremities without difficulty  Neuro:  Speech clear   Medical Decision Making  Medically screening exam initiated at 3:37 PM.  Appropriate orders placed.  Peggy Howard was informed that the remainder of the evaluation will be completed by another provider, this initial triage assessment does not replace that evaluation, and the importance of remaining in the ED until their evaluation is complete.  Clinical Impression     Roque Lias 03/25/21 1538

## 2021-03-25 NOTE — ED Notes (Signed)
Patient to CT.

## 2021-03-25 NOTE — ED Triage Notes (Signed)
Patient was seen Friday for rectal bleeding. This am she noticed bright red rectal bleeding again for 3 hours. She reports she is still bleed but it has lessened since this morning. She also complains of headache, rectal pain and diarrhea with food.

## 2021-03-25 NOTE — ED Provider Notes (Signed)
[facility] DEPT Provider Note   CSN: [id number] Arrival date & time: 03/25/21  1503     History Chief Complaint  Patient presents with  . Rectal Bleeding  . Headache  . Rectal Pain  . Diarrhea    Peggy Howard is a 87 y.o. female.  HPI Patient reports has been having intermittent problems with rectal bleeding for the past week.  She reports has been having some bright red bleeding with bowel movements.  Today, she reports she bled for 3 hours and was having red bleeding without having any bowel movement.  She reports she is having some lower abdominal pain.  Aching and cramping in nature.  This is been going on for about 2 weeks.  There is some associated low back pain as well.  No weakness numbness or tingling to the extremities.  She reports she did feel a bit lightheaded today but has not had any syncopal episodes.  No exertional shortness of breath or chest pain.  No fevers no chills.  She reports because she was having bleeding with bowel movements she stopped eating.  She reports at baseline she is very active and push mows her own yard.  Reports he does not take any kind of medicine.  Patient reports he has never had a colonoscopy.    History reviewed. No pertinent past medical history.  Patient Active Problem List   Diagnosis Date Noted  . Overweight (BMI 25.0-29.9) 07/16/2017  . Colonoscopy refused 07/16/2017  . Refuses treatment 07/16/2017  . Parent refuses immunizations 07/16/2017  . Patient refuses to take medication 07/16/2017  . History of cigarette smoking 06/26/2017  . Other fatigue 06/26/2017  . Memory changes 06/26/2017  . Community acquired pneumonia of right middle lobe of lung   . External hemorrhoids 01/26/2008    Past Surgical History:  Procedure Laterality Date  . ABDOMINAL HYSTERECTOMY    . REPLACEMENT TOTAL KNEE Bilateral      OB History   No obstetric history on file.     History reviewed. No pertinent  family history.  Social History   Tobacco Use  . Smoking status: Never Smoker  . Smokeless tobacco: Never Used  Vaping Use  . Vaping Use: Never used  Substance Use Topics  . Alcohol use: No  . Drug use: No    Home Medications Prior to Admission medications   Medication Sig Start Date End Date Taking? Authorizing Provider  acetaminophen (TYLENOL) 500 MG tablet Take 1,000 mg by mouth every 6 (six) hours as needed for mild pain, fever or headache.    [provider]    Allergies    Patient has no known allergies.  Review of Systems   Review of Systems 10 systems reviewed and negative except as per HPI Physical Exam Updated Vital Signs BP (!) 161/97 (BP Location: Right Arm)   Pulse (!) 101   Temp 98.9 F (37.2 C) (Oral)   Resp 14   Ht 5\' 2"  (1.575 m)   Wt 81.6 kg   SpO2 97%   BMI 32.92 kg/m   Physical Exam Constitutional:      Comments: Well-nourished well-developed.  Alert and nontoxic.  No respiratory distress.  HENT:     Head: Normocephalic and atraumatic.     Mouth/Throat:     Pharynx: Oropharynx is clear.  Cardiovascular:     Rate and Rhythm: Normal rate and regular rhythm.  Pulmonary:     Effort: Pulmonary effort is normal.  Breath sounds: Normal breath sounds.  Abdominal:     Comments: Abdomen soft without guarding.  Moderate diffuse lower abdominal discomfort to palpation.  Genitourinary:    Comments: Few nonthrombosed anal hemorrhoids.  No appearance of focus of active bleeding.  Digital exam does not reveal any melena.  No gross blood.  Trace pink mucus.  No masses present in the rectal canal. Skin:    General: Skin is warm and dry.  Neurological:     General: No focal deficit present.     Mental Status: She is oriented to person, place, and time.     Motor: No weakness.     Coordination: Coordination normal.  Psychiatric:        Mood and Affect: Mood normal.     ED Results / Procedures / Treatments   Labs (all labs ordered are  listed, but only abnormal results are displayed) Labs Reviewed  CBC WITH DIFFERENTIAL/PLATELET  COMPREHENSIVE METABOLIC PANEL  LIPASE, BLOOD  URINALYSIS, ROUTINE W REFLEX MICROSCOPIC  POC OCCULT BLOOD, ED    EKG EKG Interpretation  Date/Time:  Monday Mar 25 2021 16:32:36 EDT Ventricular Rate:  83 PR Interval:  149 QRS Duration: 78 QT Interval:  397 QTC Calculation: 467 R Axis:   10 Text Interpretation: Sinus rhythm no ischemic change, no sig change from previious Confirmed by Charlesetta Shanks [phone]) on 03/25/2021 5:09:12 PM   Radiology DG Lumbar Spine 2-3 Views  Result Date: 03/25/2021 CLINICAL DATA:  Back pain with no known injury EXAM: LUMBAR SPINE - 2-3 VIEW COMPARISON:  None. FINDINGS: There are advanced multi level degenerative changes throughout the lumbar spine with multilevel severe disc height loss and facet arthrosis. There is no definite acute compression fracture. Aortic calcifications are noted. IMPRESSION: Advanced degenerative changes of the lumbar spine without evidence for an acute osseous abnormality. Electronically Signed   By: Constance Holster M.D.   On: 03/25/2021 21:48   CT Abdomen Pelvis W Contrast  Result Date: 03/25/2021 CLINICAL DATA:  Abdominal distension.  Rectal bleeding. EXAM: CT ABDOMEN AND PELVIS WITH CONTRAST TECHNIQUE: Multidetector CT imaging of the abdomen and pelvis was performed using the standard protocol following bolus administration of intravenous contrast. CONTRAST:  <See Chart> OMNIPAQUE IOHEXOL 300 MG/ML  SOLN COMPARISON:  None. FINDINGS: Lower chest: 4 mm nodule in the RIGHT middle lobe (image 4/ 4). Several additional nodules over the RIGHT hemidiaphragm (image 13/4 and 36/4 ) Hepatobiliary: Small hypodense lesion in the LEFT lateral hepatic lobe measuring 3 mm cannot be fully characterize. No biliary duct dilatation. Gallbladder is distended to 4.3 cm. There several dependent gallstones. No evidence acute inflammation. Pancreas: Pancreas is  normal. No ductal dilatation. No pancreatic inflammation. Spleen: Normal spleen Adrenals/urinary tract: Adrenal glands, kidneys and bladder normal. Stomach/Bowel: Stomach, small-bowel and cecum are normal. The appendix is not identified but there is no pericecal inflammation to suggest appendicitis. Multiple diverticula of the descending colon and sigmoid colon without acute inflammation. There is nonspecific thickening through a short segment of rectum with with luminal collapse on image 79/2. Region measures 3 cm on sagittal image 103/series 6 . No mesorectal adenopathy Vascular/Lymphatic: Abdominal aorta is normal caliber. No periportal or retroperitoneal adenopathy. No pelvic adenopathy. Reproductive: Post hysterectomy.  Adnexa unremarkable Other: No free fluid. Musculoskeletal: No aggressive osseous lesion. IMPRESSION: 1. Short segment of rectal wall thickening and luminal collapse. Cannot exclude a rectal mucosal lesion. Consider flexible sigmoidoscopy for further evaluation. 2. Multiple pulmonary nodules at the RIGHT lung base which are predominately pleural base  which is typically a benign characteristic. Lesions may need further evaluation in patient with potential rectal lesion. 3. LEFT colon diverticulosis without diverticulitis. 4. Multiple gallstones without evidence acute cholecystitis. 5.  Aortic Atherosclerosis (ICD10-I70.0). Electronically Signed   By: Suzy Bouchard M.D.   On: 03/25/2021 18:34    Procedures Procedures   Medications Ordered in ED Medications  lactated ringers infusion (has no administration in time range)    ED Course  I have reviewed the triage vital signs and the nursing notes.  Pertinent labs & imaging results that were available during my care of the patient were reviewed by me and considered in my medical decision making (see chart for details).  Clinical Course as of 03/26/21 1033  Mon Mar 25, 2021  1956 Consult: Dr. Posey Pronto hospitalist for admission. [MP]     Clinical Course User Index [MP] Charlesetta Shanks, MD   MDM Rules/Calculators/A&P                          Patient presents with sporadic rectal bleeding over the past week.  Today she experienced 3 hours of continuous rectal bleeding.  At that time she did experience lightheadedness.  She also describes several days or more of ongoing lower abdominal discomfort and back pain.  CT shows rectal thickening.  Possible source of bleeding.  With prolonged episode of bleeding today and patient's age with finding concerning for a bleeding lesion in the rectum, will admit for observation and further diagnostic evaluation as indicated. Final Clinical Impression(s) / ED Diagnoses Final diagnoses:  Rectal bleeding  Lower abdominal pain    Rx / DC Orders ED Discharge Orders    None       Charlesetta Shanks, MD 03/26/21 1035

## 2021-03-25 NOTE — H&P (Signed)
History and Physical    Peggy Howard MWU:[id number] DOB: 1933/01/08 DOA: 03/25/2021  PCP: Lorrene Reid, PA-C    Patient coming from:  Home   Chief Complaint:  Rectal bleeding.   HPI: Peggy Howard is a 87 y.o. female seen in ed with complaints of bleeding per rectum since she woke up today at about 630 and use the commode.  She states the blood is bright red, he had some abdominal pain and also low back pain.  Abdominal pain is 10 out of 10, periumbilical and generalized, sharp, stabbing, pain is worse with bending walking and sitting and is alleviated by upright posture.  Patient also reports nausea and vomiting.  Patient reports diarrhea for 2 months with bowel incontinence, immediately related to after her meals.  Patient denies any NSAID use, any alcohol or drugs, only uses Tylenol for pain.  Patient has smoked in the past, and drinks occasionally, reports rectal bleeding about a week ago as well at which time patient was here and seen and discharged with GI follow-up was not able to get an appointment.  Family member at bedside daughter states that she is to have an appointment with [facility].  Bleeding was profuse, no associated symptoms of dizziness or loss of consciousness.  Patient has never had a colonoscopy. Pt has no past medical history and has had bl knee sx and has arthritis on both her hands. ED Course:  Vitals:   03/25/21 1945 03/25/21 2000 03/25/21 2030 03/25/21 2100  BP: (!) 165/98 (!) 157/89 (!) 179/105 (!) 158/75  Pulse: 91 87 83 82  Resp: (!) 21 18 20  (!) 23  Temp:      TempSrc:      SpO2: 94% 91% 94% 93%  Weight:      Height:      In ed pt is alert,awake and oriented afebrile and hard of hearing.  Labs show normal cmp and cbc. CT abd pelvis shows rectal thickening as possible source of bleeding. FOBT is positive.    Review of Systems:  Review of Systems  Gastrointestinal: Positive for abdominal pain, blood in stool, diarrhea and nausea.  All  other systems reviewed and are negative.    History reviewed. No pertinent past medical history.  Past Surgical History:  Procedure Laterality Date  . ABDOMINAL HYSTERECTOMY    . REPLACEMENT TOTAL KNEE Bilateral      reports that she has never smoked. She has never used smokeless tobacco. She reports that she does not drink alcohol and does not use drugs.  No Known Allergies  History reviewed. No pertinent family history.  Prior to Admission medications   Medication Sig Start Date End Date Taking? Authorizing Provider  acetaminophen (TYLENOL) 500 MG tablet Take 1,000 mg by mouth every 6 (six) hours as needed for mild pain, fever or headache.    [provider]    Physical Exam: Vitals:   03/25/21 1945 03/25/21 2000 03/25/21 2030 03/25/21 2100  BP: (!) 165/98 (!) 157/89 (!) 179/105 (!) 158/75  Pulse: 91 87 83 82  Resp: (!) 21 18 20  (!) 23  Temp:      TempSrc:      SpO2: 94% 91% 94% 93%  Weight:      Height:       Physical Exam Vitals and nursing note reviewed.  Constitutional:      Appearance: She is not ill-appearing.  HENT:     Head: Normocephalic and atraumatic.     Nose: Nose  normal.     Mouth/Throat:     Mouth: Mucous membranes are moist.  Eyes:     Extraocular Movements: Extraocular movements intact.     Pupils: Pupils are equal, round, and reactive to light.  Neck:     Vascular: No carotid bruit.  Cardiovascular:     Rate and Rhythm: Normal rate and regular rhythm.     Pulses: Normal pulses.     Heart sounds: Normal heart sounds.  Pulmonary:     Effort: Pulmonary effort is normal.     Breath sounds: Normal breath sounds.  Abdominal:     General: Bowel sounds are normal. There is no distension.     Palpations: Abdomen is soft. There is no mass.     Tenderness: There is no abdominal tenderness. There is no guarding.     Hernia: No hernia is present.  Musculoskeletal:        General: Normal range of motion.     Right lower leg: No edema.      Left lower leg: No edema.  Skin:    General: Skin is warm and dry.  Neurological:     General: No focal deficit present.     Mental Status: She is alert and oriented to person, place, and time.  Psychiatric:        Mood and Affect: Mood normal.        Behavior: Behavior normal.      Labs on Admission: I have personally reviewed following labs and imaging studies  No results for input(s): CKTOTAL, CKMB, TROPONINI in the last 72 hours. Lab Results  Component Value Date   WBC 5.2 03/25/2021   HGB 14.2 03/25/2021   HCT 43.6 03/25/2021   MCV 96.0 03/25/2021   PLT 277 03/25/2021    Recent Labs  Lab 03/25/21 1612  NA 138  K 4.3  CL 103  CO2 26  BUN 17  CREATININE 0.95  CALCIUM 9.2  PROT 7.4  BILITOT 0.8  ALKPHOS 78  ALT 14  AST 26  GLUCOSE 112*   Lab Results  Component Value Date   CHOL 224 (H) 07/16/2017   HDL 76 07/16/2017   LDLCALC 130 (H) 07/16/2017   TRIG 88 07/16/2017   No results found for: DDIMER Invalid input(s): POCBNP  Urinalysis    Component Value Date/Time   COLORURINE YELLOW 03/25/2021 1537   APPEARANCEUR HAZY (A) 03/25/2021 1537   LABSPEC 1.027 03/25/2021 1537   PHURINE 6.0 03/25/2021 1537   GLUCOSEU NEGATIVE 03/25/2021 1537   HGBUR MODERATE (A) 03/25/2021 1537   BILIRUBINUR NEGATIVE 03/25/2021 1537   KETONESUR NEGATIVE 03/25/2021 1537   PROTEINUR NEGATIVE 03/25/2021 1537   NITRITE NEGATIVE 03/25/2021 1537   LEUKOCYTESUR NEGATIVE 03/25/2021 1537        COVID-19 Labs  No results for input(s): DDIMER, FERRITIN, LDH, CRP in the last 72 hours.  No results found for: [place]  Radiological Exams on Admission: CT Abdomen Pelvis W Contrast  Result Date: 03/25/2021 CLINICAL DATA:  Abdominal distension.  Rectal bleeding. EXAM: CT ABDOMEN AND PELVIS WITH CONTRAST TECHNIQUE: Multidetector CT imaging of the abdomen and pelvis was performed using the standard protocol following bolus administration of intravenous contrast. CONTRAST:  <See  Chart> OMNIPAQUE IOHEXOL 300 MG/ML  SOLN COMPARISON:  None. FINDINGS: Lower chest: 4 mm nodule in the RIGHT middle lobe (image 4/ 4). Several additional nodules over the RIGHT hemidiaphragm (image 13/4 and 36/4 ) Hepatobiliary: Small hypodense lesion in the LEFT lateral hepatic lobe measuring 3  mm cannot be fully characterize. No biliary duct dilatation. Gallbladder is distended to 4.3 cm. There several dependent gallstones. No evidence acute inflammation. Pancreas: Pancreas is normal. No ductal dilatation. No pancreatic inflammation. Spleen: Normal spleen Adrenals/urinary tract: Adrenal glands, kidneys and bladder normal. Stomach/Bowel: Stomach, small-bowel and cecum are normal. The appendix is not identified but there is no pericecal inflammation to suggest appendicitis. Multiple diverticula of the descending colon and sigmoid colon without acute inflammation. There is nonspecific thickening through a short segment of rectum with with luminal collapse on image 79/2. Region measures 3 cm on sagittal image 103/series 6 . No mesorectal adenopathy Vascular/Lymphatic: Abdominal aorta is normal caliber. No periportal or retroperitoneal adenopathy. No pelvic adenopathy. Reproductive: Post hysterectomy.  Adnexa unremarkable Other: No free fluid. Musculoskeletal: No aggressive osseous lesion. IMPRESSION: 1. Short segment of rectal wall thickening and luminal collapse. Cannot exclude a rectal mucosal lesion. Consider flexible sigmoidoscopy for further evaluation. 2. Multiple pulmonary nodules at the RIGHT lung base which are predominately pleural base which is typically a benign characteristic. Lesions may need further evaluation in patient with potential rectal lesion. 3. LEFT colon diverticulosis without diverticulitis. 4. Multiple gallstones without evidence acute cholecystitis. 5.  Aortic Atherosclerosis (ICD10-I70.0). Electronically Signed   By: Suzy Bouchard M.D.   On: 03/25/2021 18:34    EKG: Independently  reviewed.  S.R 83.   Assessment/Plan Principal Problem:   BRBPR (bright red blood per rectum) Active Problems:   Abdominal pain   Low back pain   BRBPR: D/d include LGI source like avm or hemorrhoidal colon polyp or cancer. We will type and screen anhd transfuse of hb fall s below 8 and obtain GI consult in am  Iv ppi therapy and clear liquid diet.   Abdominal pain: CT abd shows: 1. Short segment of rectal wall thickening and luminal collapse. Cannot exclude a rectal mucosal lesion. Consider flexible sigmoidoscopy for further evaluation. 2. Multiple pulmonary nodules at the RIGHT lung base which are predominately pleural base which is typically a benign characteristic. Lesions may need further evaluation in patient with potential rectal lesion. 3. LEFT colon diverticulosis without diverticulitis. 4. Multiple gallstones without evidence acute cholecystitis. 5.  Aortic Atherosclerosis (ICD10-I70.0). PRN Tylenol/ zofran.   Low back pain: Suspect related to arthritis but we will get xray of ls spine.   Pulmonary nodule: Ct chest once pt is stable and bleeding has been evaluated.    DVT prophylaxis:  SCD's  Code Status:  Full Code   Family Communication:  Erlandson, lindsey (Relative)  [phone] (Mobile)   Disposition Plan:  Home    Consults called:  None   Admission status: Inpatient.     Para Skeans MD [facility] [phone] How to contact the [facility] Attending or Consulting provider [place] or covering provider during after hours [place], for this patient.    1. Check the care team in [facility] and look for a) attending/consulting [place] provider listed and b) the [facility] team listed 2. Log into [email or web address] and use [place]'s universal password to access. If you do not have the password, please contact the hospital operator. 3. Locate the [facility] provider you are looking for under [facility] and page to a number that you can be directly reached. 4. If  you still have difficulty reaching the provider, please page the [facility] (Director on Call) for the Hospitalists listed on amion for assistance. [email or web address] Password [facility] 03/25/2021, 9:12 PM

## 2021-03-25 NOTE — ED Notes (Signed)
Son: Glendell Docker given an update. He is requesting to speak with MD. Message sent to Dr. Johnney Killian.

## 2021-03-25 NOTE — ED Notes (Signed)
Per Privacy Practices Receipt and Request For HIPAA Restrictions, updates no longer to be given to Peggy Howard (son). He is to contact brother Peggy Howard or Sister in [place].   Peggy Howard is aware that no updates can be given to him at this time.

## 2021-03-26 DIAGNOSIS — K625 Hemorrhage of anus and rectum: Secondary | ICD-10-CM | POA: Diagnosis not present

## 2021-03-26 DIAGNOSIS — K922 Gastrointestinal hemorrhage, unspecified: Secondary | ICD-10-CM | POA: Diagnosis present

## 2021-03-26 DIAGNOSIS — K5790 Diverticulosis of intestine, part unspecified, without perforation or abscess without bleeding: Secondary | ICD-10-CM

## 2021-03-26 DIAGNOSIS — R933 Abnormal findings on diagnostic imaging of other parts of digestive tract: Secondary | ICD-10-CM

## 2021-03-26 LAB — HEMOGLOBIN AND HEMATOCRIT, BLOOD
HCT: 38.8 % (ref 36.0–46.0)
HCT: 42.5 % (ref 36.0–46.0)
HCT: 46.9 % — ABNORMAL HIGH (ref 36.0–46.0)
Hemoglobin: 12.6 g/dL (ref 12.0–15.0)
Hemoglobin: 13.8 g/dL (ref 12.0–15.0)
Hemoglobin: 15.1 g/dL — ABNORMAL HIGH (ref 12.0–15.0)

## 2021-03-26 LAB — CBC
HCT: 42.1 % (ref 36.0–46.0)
Hemoglobin: 13.6 g/dL (ref 12.0–15.0)
MCH: 31.1 pg (ref 26.0–34.0)
MCHC: 32.3 g/dL (ref 30.0–36.0)
MCV: 96.1 fL (ref 80.0–100.0)
Platelets: 241 10*3/uL (ref 150–400)
RBC: 4.38 MIL/uL (ref 3.87–5.11)
RDW: 13.1 % (ref 11.5–15.5)
WBC: 3.1 10*3/uL — ABNORMAL LOW (ref 4.0–10.5)
nRBC: 0 % (ref 0.0–0.2)

## 2021-03-26 LAB — ABO/RH: ABO/RH(D): O POS

## 2021-03-26 LAB — SARS CORONAVIRUS 2 (TAT 6-24 HRS): SARS Coronavirus 2: NEGATIVE

## 2021-03-26 MED ORDER — PEG-KCL-NACL-NASULF-NA ASC-C 100 G PO SOLR
0.5000 | Freq: Once | ORAL | Status: AC
  Administered 2021-03-27: 100 g via ORAL

## 2021-03-26 MED ORDER — PEG-KCL-NACL-NASULF-NA ASC-C 100 G PO SOLR
0.5000 | Freq: Once | ORAL | Status: AC
  Administered 2021-03-26: 100 g via ORAL
  Filled 2021-03-26: qty 1

## 2021-03-26 MED ORDER — PEG-KCL-NACL-NASULF-NA ASC-C 100 G PO SOLR: 1.0000 | Freq: Once | ORAL | Status: DC

## 2021-03-26 NOTE — ED Notes (Signed)
Peggy Howard [phone] patient's son

## 2021-03-26 NOTE — Progress Notes (Signed)
PROGRESS NOTE  Peggy Howard Omlor GPQ:[id number] DOB: 1933/03/05 DOA: 03/25/2021 PCP: Lorrene Reid, PA-C  Brief History   87 year old woman presented with painless rectal bleeding.  GI plans endoscopy 5/4.  Hemoglobin stable thus far.  A & P  Painless rectal bleeding.  CT possible rectal mucosal lesion. --Hemoglobin stable thus far.  Hemodynamic stable. --Trend hemoglobin.  Diet as per GI, n.p.o. after midnight for colonoscopy tomorrow.  Multiple pulmonary nodules right lung base typically benign per radiology, however if rectal lesion is significant, may require further evaluation  Aortic atherosclerosis --No treatment indicated at this time.  Follow-up as an outpatient.    Disposition Plan:  Discussion:   Status is: Inpatient  Remains inpatient appropriate because:Ongoing diagnostic testing needed not appropriate for outpatient work up and Inpatient level of care appropriate due to severity of illness   Dispo: The patient is from: Home              Anticipated d/c is to: Home              Patient currently is not medically stable to d/c.   Difficult to place patient No  DVT prophylaxis: SCDs Start: 03/25/21 2109   Code Status: Full Code Level of care: Med-Surg Family Communication: daughter at bedside  Murray Hodgkins, MD  [facility] Direct contact: see [email or web address] (further directions at bottom of note if needed) 7PM-7AM contact night coverage as at bottom of note 03/26/2021, 3:08 PM  LOS: 1 day   Significant Hospital Events   . 5/2 admit for GIB   Consults:  . Gastroenterology    Procedures:  .   Significant Diagnostic Tests:  . 5/2 CT abdomen pelvis short segment rectal wall thickening cannot exclude rectal mucosal lesion.  Multiple pulmonary nodules favored to be benign.   Micro Data:  . COVID negative   Antimicrobials:  . None   Interval History/Subjective  CC: f/u bleeding  Continues to have bleeding when sitting up.  Bleeding for several  days.  No previous GI bleeding.  Painless.  Objective   Vitals:  Vitals:   03/26/21 1259 03/26/21 1337  BP: (!) 143/89 (!) 141/75  Pulse: 73 65  Resp: 18 17  Temp: 98 F (36.7 C) 97.9 F (36.6 C)  SpO2: 96% 94%    Exam:  Constitutional:   . Appears calm and comfortable ENMT:  . grossly normal hearing  Respiratory:  . CTA bilaterally, no w/r/r.  . Respiratory effort normal.  Cardiovascular:  . RRR, no m/r/g . No LE extremity edema   Psychiatric:  . Mental status o Mood, affect appropriate  I have personally reviewed the following:   Today's Data  . Hgb stable . CMP unremarkable . EKG SR  Scheduled Meds: . pantoprazole (PROTONIX) IV  40 mg Intravenous Q12H  . peg 3350 powder  0.5 kit Oral Once   And  . [START ON 03/27/2021] peg 3350 powder  0.5 kit Oral Once  . sodium chloride flush  3 mL Intravenous Q12H   Continuous Infusions: . sodium chloride 50 mL/hr at 03/26/21 1001  . lactated ringers Stopped (03/26/21 0052)    Principal Problem:   BRBPR (bright red blood per rectum) Active Problems:   Low back pain   GIB (gastrointestinal bleeding)   LOS: 1 day   How to contact the [facility] Attending or Consulting provider [place] or covering provider during after hours [place], for this patient?  1. Check the care team in [facility] and look for  a) attending/consulting [place] provider listed and b) the [facility] team listed 2. Log into [email or web address] and use [place]'s universal password to access. If you do not have the password, please contact the hospital operator. 3. Locate the [facility] provider you are looking for under [facility] and page to a number that you can be directly reached. 4. If you still have difficulty reaching the provider, please page the [facility] (Director on Call) for the Hospitalists listed on amion for assistance.

## 2021-03-26 NOTE — H&P (View-Only) (Signed)
Consultation  Referring Provider: [facility]/ Posey Pronto Primary Care Physician:  Lorrene Reid, PA-C Primary Gastroenterologist:  None- has upcoming appt [place].  Reason for Consultation:  Rectal bleeding, abdominal  pain  HPI: Lisha Alinger Knape is a 87 y.o. female, new to GI, who had been evaluated in the emergency room on 03/22/2021 after onset of low back pain, lower abdominal pain, and rectal bleeding of bright red blood and mucoid material.  She was then scheduled to be seen in our office later this week.  She had been discharged to home and then [facility] presented to the emergency room yesterday after she started having further rectal bleeding. Patient has no prior GI history, has been in generally very good health status post remote hysterectomy, with history of osteoarthritis and is status post left knee replacement.  She is not on any regular medications. She says her initial symptoms started a couple of weeks ago with some crampy and sharp low back pain and lower abdominal pain and had noticed looser stools and urgency with lack of control of her bowel movements.  She had not noted any associated fever or chills, no nausea or vomiting.  Appetite has been good, but she has now stopped eating over the past few days for fear of aggravating her symptoms. On Friday for 29 2022 she had onset of rectal bleeding with bright red blood.  Stool was documented heme negative and hemoglobin was 14.2, she was allowed discharged to home.  She says she continued to have the somewhat looser stools, sharp low back pain and intermittent lower abdominal pain over the weekend, then yesterday she had an episode of rectal bleeding that she said was fairly continuous and lasted for about 3 hours with what she describes as slimy mucoid red blood with urgency and lack of control of bowel movements. Labs show WBC of 5.2, hemoglobin 14.2/hematocrit 43.6, c-Met unremarkable.  Repeat labs today hemoglobin 12.6 hematocrit of  38  CT of the abdomen pelvis shows multiple pulmonary nodules, distended gallbladder without gallbladder wall thickening and several gallstones, she has multiple colonic diverticuli and there is thickening of a segment of the rectum measuring about 3 cm in length with associated luminal collapse.  Cannot exclude a rectal mucosal lesion   History reviewed. No pertinent past medical history.  Past Surgical History:  Procedure Laterality Date  . ABDOMINAL HYSTERECTOMY    . REPLACEMENT TOTAL KNEE Bilateral     Prior to Admission medications   Medication Sig Start Date End Date Taking? Authorizing Provider  acetaminophen (TYLENOL) 500 MG tablet Take 1,000 mg by mouth every 6 (six) hours as needed for mild pain, fever or headache.   Yes [provider]  Multiple Vitamin (MULTIVITAMIN WITH MINERALS) TABS tablet Take 1 tablet by mouth daily.   Yes [provider]  Multiple Vitamins-Minerals (PRESERVISION AREDS 2 PO) Take 1 tablet by mouth 2 (two) times daily.   Yes [provider]    Current Facility-Administered Medications  Medication Dose Route Frequency Provider Last Rate Last Admin  . 0.9 %  sodium chloride infusion   Intravenous Continuous Para Skeans, MD 50 mL/hr at 03/26/21 0053 New Bag at 03/26/21 0053  . acetaminophen (TYLENOL) tablet 650 mg  650 mg Oral Q6H PRN Para Skeans, MD       Or  . acetaminophen (TYLENOL) suppository 650 mg  650 mg Rectal Q6H PRN Para Skeans, MD      . lactated ringers infusion   Intravenous  Continuous Charlesetta Shanks, MD   Stopped at 03/26/21 [phone]  . ondansetron (ZOFRAN) tablet 4 mg  4 mg Oral Q6H PRN Para Skeans, MD       Or  . ondansetron (ZOFRAN) injection 4 mg  4 mg Intravenous Q6H PRN Para Skeans, MD      . pantoprazole (PROTONIX) injection 40 mg  40 mg Intravenous Q12H Para Skeans, MD   40 mg at 03/25/21 2232  . sodium chloride flush (NS) 0.9 % injection 3 mL  3 mL Intravenous Q12H Para Skeans, MD        Current Outpatient Medications  Medication Sig Dispense Refill  . acetaminophen (TYLENOL) 500 MG tablet Take 1,000 mg by mouth every 6 (six) hours as needed for mild pain, fever or headache.    . Multiple Vitamin (MULTIVITAMIN WITH MINERALS) TABS tablet Take 1 tablet by mouth daily.    . Multiple Vitamins-Minerals (PRESERVISION AREDS 2 PO) Take 1 tablet by mouth 2 (two) times daily.      Allergies as of 03/25/2021  . (No Known Allergies)    History reviewed. No pertinent family history.  Social History   Socioeconomic History  . Marital status: Widowed    Spouse name: Not on file  . Number of children: Not on file  . Years of education: Not on file  . Highest education level: Not on file  Occupational History  . Not on file  Tobacco Use  . Smoking status: Never Smoker  . Smokeless tobacco: Never Used  Vaping Use  . Vaping Use: Never used  Substance and Sexual Activity  . Alcohol use: No  . Drug use: No  . Sexual activity: Never  Other Topics Concern  . Not on file  Social History Narrative  . Not on file   Social Determinants of Health   Financial Resource Strain: Not on file  Food Insecurity: Not on file  Transportation Needs: Not on file  Physical Activity: Not on file  Stress: Not on file  Social Connections: Not on file  Intimate Partner Violence: Not on file    Review of Systems: Pertinent positive and negative review of systems were noted in the above HPI section.  All other review of systems was otherwise negative.Marland Kitchen  Physical Exam: Vital signs in last 24 hours: Temp:  [98.9 F (37.2 C)] 98.9 F (37.2 C) (05/02 1521) Pulse Rate:  [56-101] 70 (05/03 0700) Resp:  [14-25] 15 (05/03 0700) BP: (113-188)/(73-105) 175/88 (05/03 0700) SpO2:  [88 %-97 %] 94 % (05/03 0700) Weight:  [81.6 kg] 81.6 kg (05/02 1528)   General:   Alert,  Well-developed, well-nourished, elderly white female pleasant and cooperative in NAD Head:  Normocephalic and  atraumatic. Eyes:  Sclera clear, no icterus.   Conjunctiva pink. Ears:  Normal auditory acuity. Nose:  No deformity, discharge,  or lesions. Mouth:  No deformity or lesions.   Neck:  Supple; no masses or thyromegaly. Lungs:  Clear throughout to auscultation.   No wheezes, crackles, or rhonchi. Heart:  Regular rate and rhythm; no murmurs, clicks, rubs,  or gallops. Abdomen:  Soft mildly tender in the lower abdomen/suprapubic area, BS active,nonpalp mass or hsm.   Rectal: Not done Msk:  Symmetrical without gross deformities. . Pulses:  Normal pulses noted. Extremities:  Without clubbing or edema. Neurologic:  Alert and  oriented x4;  grossly normal neurologically. Skin:  Intact without significant lesions or rashes.. Psych:  Alert and cooperative. Normal mood and affect.  Intake/Output  from previous day: No intake/output data recorded. Intake/Output this shift: No intake/output data recorded.  Lab Results: Recent Labs    03/25/21 1612 03/25/21 2230 03/26/21 0311  WBC 5.2  --   --   HGB 14.2 13.3 12.6  HCT 43.6 40.8 38.8  PLT 277  --   --    BMET Recent Labs    03/25/21 1612  NA 138  K 4.3  CL 103  CO2 26  GLUCOSE 112*  BUN 17  CREATININE 0.95  CALCIUM 9.2   LFT Recent Labs    03/25/21 1612  PROT 7.4  ALBUMIN 4.2  AST 26  ALT 14  ALKPHOS 78  BILITOT 0.8   PT/INR No results for input(s): LABPROT, INR in the last 72 hours. Hepatitis Panel No results for input(s): HEPBSAG, HCVAB, HEPAIGM, HEPBIGM in the last 72 hours.   IMPRESSION:  #15 87 year old white female presenting with bright red blood per rectum.  Initial onset about 5 days ago and then recurred yesterday with a prolonged episode of passage of red blood and mucoid material. She has had about a 2-week history of low back pain which she describes as sharp and crampy, she cannot definitely appreciate any rectal pain, has had some lower abdominal cramping as well and looser stools with urgency and some  incontinence.  CT suggested short segment of rectal wall thickening cannot exclude mucosal lesion  Rule out rectal neoplasm, rule out segmental ischemia  #2 diverticulosis #3 multiple gallstones #4 pulmonary nodules-predominantly pleural-based #5 osteoarthritis status post left knee replacement #6 status post hysterectomy  Plan; clear liquid diet today, n.p.o. after midnight Serial hemoglobins and transfuse for hemoglobin less than 8 We will plan bowel prep later this evening and colonoscopy with Dr. Rush Landmark tomorrow which will be early afternoon Procedure was discussed in detail with the patient including indications risks and benefits and she is agreeable to proceed Further plans pending findings at endoscopic evaluation.     Peja Allender EsterwoodPA-C  03/26/2021, 8:50 AM

## 2021-03-26 NOTE — Consult Note (Signed)
Consultation  Referring Provider: [facility]/ Posey Pronto Primary Care Physician:  Lorrene Reid, PA-C Primary Gastroenterologist:  None- has upcoming appt [place].  Reason for Consultation:  Rectal bleeding, abdominal  pain  HPI: Peggy Howard is a 87 y.o. female, new to GI, who had been evaluated in the emergency room on 03/22/2021 after onset of low back pain, lower abdominal pain, and rectal bleeding of bright red blood and mucoid material.  She was then scheduled to be seen in our office later this week.  She had been discharged to home and then [facility] presented to the emergency room yesterday after she started having further rectal bleeding. Patient has no prior GI history, has been in generally very good health status post remote hysterectomy, with history of osteoarthritis and is status post left knee replacement.  She is not on any regular medications. She says her initial symptoms started a couple of weeks ago with some crampy and sharp low back pain and lower abdominal pain and had noticed looser stools and urgency with lack of control of her bowel movements.  She had not noted any associated fever or chills, no nausea or vomiting.  Appetite has been good, but she has now stopped eating over the past few days for fear of aggravating her symptoms. On Friday for 29 2022 she had onset of rectal bleeding with bright red blood.  Stool was documented heme negative and hemoglobin was 14.2, she was allowed discharged to home.  She says she continued to have the somewhat looser stools, sharp low back pain and intermittent lower abdominal pain over the weekend, then yesterday she had an episode of rectal bleeding that she said was fairly continuous and lasted for about 3 hours with what she describes as slimy mucoid red blood with urgency and lack of control of bowel movements. Labs show WBC of 5.2, hemoglobin 14.2/hematocrit 43.6, c-Met unremarkable.  Repeat labs today hemoglobin 12.6 hematocrit of  38  CT of the abdomen pelvis shows multiple pulmonary nodules, distended gallbladder without gallbladder wall thickening and several gallstones, she has multiple colonic diverticuli and there is thickening of a segment of the rectum measuring about 3 cm in length with associated luminal collapse.  Cannot exclude a rectal mucosal lesion   History reviewed. No pertinent past medical history.  Past Surgical History:  Procedure Laterality Date  . ABDOMINAL HYSTERECTOMY    . REPLACEMENT TOTAL KNEE Bilateral     Prior to Admission medications   Medication Sig Start Date End Date Taking? Authorizing Provider  acetaminophen (TYLENOL) 500 MG tablet Take 1,000 mg by mouth every 6 (six) hours as needed for mild pain, fever or headache.   Yes [provider]  Multiple Vitamin (MULTIVITAMIN WITH MINERALS) TABS tablet Take 1 tablet by mouth daily.   Yes [provider]  Multiple Vitamins-Minerals (PRESERVISION AREDS 2 PO) Take 1 tablet by mouth 2 (two) times daily.   Yes [provider]    Current Facility-Administered Medications  Medication Dose Route Frequency Provider Last Rate Last Admin  . 0.9 %  sodium chloride infusion   Intravenous Continuous Para Skeans, MD 50 mL/hr at 03/26/21 0053 New Bag at 03/26/21 0053  . acetaminophen (TYLENOL) tablet 650 mg  650 mg Oral Q6H PRN Para Skeans, MD       Or  . acetaminophen (TYLENOL) suppository 650 mg  650 mg Rectal Q6H PRN Para Skeans, MD      . lactated ringers infusion   Intravenous  Continuous Charlesetta Shanks, MD   Stopped at 03/26/21 [phone]  . ondansetron (ZOFRAN) tablet 4 mg  4 mg Oral Q6H PRN Para Skeans, MD       Or  . ondansetron (ZOFRAN) injection 4 mg  4 mg Intravenous Q6H PRN Para Skeans, MD      . pantoprazole (PROTONIX) injection 40 mg  40 mg Intravenous Q12H Para Skeans, MD   40 mg at 03/25/21 2232  . sodium chloride flush (NS) 0.9 % injection 3 mL  3 mL Intravenous Q12H Para Skeans, MD        Current Outpatient Medications  Medication Sig Dispense Refill  . acetaminophen (TYLENOL) 500 MG tablet Take 1,000 mg by mouth every 6 (six) hours as needed for mild pain, fever or headache.    . Multiple Vitamin (MULTIVITAMIN WITH MINERALS) TABS tablet Take 1 tablet by mouth daily.    . Multiple Vitamins-Minerals (PRESERVISION AREDS 2 PO) Take 1 tablet by mouth 2 (two) times daily.      Allergies as of 03/25/2021  . (No Known Allergies)    History reviewed. No pertinent family history.  Social History   Socioeconomic History  . Marital status: Widowed    Spouse name: Not on file  . Number of children: Not on file  . Years of education: Not on file  . Highest education level: Not on file  Occupational History  . Not on file  Tobacco Use  . Smoking status: Never Smoker  . Smokeless tobacco: Never Used  Vaping Use  . Vaping Use: Never used  Substance and Sexual Activity  . Alcohol use: No  . Drug use: No  . Sexual activity: Never  Other Topics Concern  . Not on file  Social History Narrative  . Not on file   Social Determinants of Health   Financial Resource Strain: Not on file  Food Insecurity: Not on file  Transportation Needs: Not on file  Physical Activity: Not on file  Stress: Not on file  Social Connections: Not on file  Intimate Partner Violence: Not on file    Review of Systems: Pertinent positive and negative review of systems were noted in the above HPI section.  All other review of systems was otherwise negative.Marland Kitchen  Physical Exam: Vital signs in last 24 hours: Temp:  [98.9 F (37.2 C)] 98.9 F (37.2 C) (05/02 1521) Pulse Rate:  [56-101] 70 (05/03 0700) Resp:  [14-25] 15 (05/03 0700) BP: (113-188)/(73-105) 175/88 (05/03 0700) SpO2:  [88 %-97 %] 94 % (05/03 0700) Weight:  [81.6 kg] 81.6 kg (05/02 1528)   General:   Alert,  Well-developed, well-nourished, elderly white female pleasant and cooperative in NAD Head:  Normocephalic and  atraumatic. Eyes:  Sclera clear, no icterus.   Conjunctiva pink. Ears:  Normal auditory acuity. Nose:  No deformity, discharge,  or lesions. Mouth:  No deformity or lesions.   Neck:  Supple; no masses or thyromegaly. Lungs:  Clear throughout to auscultation.   No wheezes, crackles, or rhonchi. Heart:  Regular rate and rhythm; no murmurs, clicks, rubs,  or gallops. Abdomen:  Soft mildly tender in the lower abdomen/suprapubic area, BS active,nonpalp mass or hsm.   Rectal: Not done Msk:  Symmetrical without gross deformities. . Pulses:  Normal pulses noted. Extremities:  Without clubbing or edema. Neurologic:  Alert and  oriented x4;  grossly normal neurologically. Skin:  Intact without significant lesions or rashes.. Psych:  Alert and cooperative. Normal mood and affect.  Intake/Output  from previous day: No intake/output data recorded. Intake/Output this shift: No intake/output data recorded.  Lab Results: Recent Labs    03/25/21 1612 03/25/21 2230 03/26/21 0311  WBC 5.2  --   --   HGB 14.2 13.3 12.6  HCT 43.6 40.8 38.8  PLT 277  --   --    BMET Recent Labs    03/25/21 1612  NA 138  K 4.3  CL 103  CO2 26  GLUCOSE 112*  BUN 17  CREATININE 0.95  CALCIUM 9.2   LFT Recent Labs    03/25/21 1612  PROT 7.4  ALBUMIN 4.2  AST 26  ALT 14  ALKPHOS 78  BILITOT 0.8   PT/INR No results for input(s): LABPROT, INR in the last 72 hours. Hepatitis Panel No results for input(s): HEPBSAG, HCVAB, HEPAIGM, HEPBIGM in the last 72 hours.   IMPRESSION:  #15 87 year old white female presenting with bright red blood per rectum.  Initial onset about 5 days ago and then recurred yesterday with a prolonged episode of passage of red blood and mucoid material. She has had about a 2-week history of low back pain which she describes as sharp and crampy, she cannot definitely appreciate any rectal pain, has had some lower abdominal cramping as well and looser stools with urgency and some  incontinence.  CT suggested short segment of rectal wall thickening cannot exclude mucosal lesion  Rule out rectal neoplasm, rule out segmental ischemia  #2 diverticulosis #3 multiple gallstones #4 pulmonary nodules-predominantly pleural-based #5 osteoarthritis status post left knee replacement #6 status post hysterectomy  Plan; clear liquid diet today, n.p.o. after midnight Serial hemoglobins and transfuse for hemoglobin less than 8 We will plan bowel prep later this evening and colonoscopy with Dr. Rush Landmark tomorrow which will be early afternoon Procedure was discussed in detail with the patient including indications risks and benefits and she is agreeable to proceed Further plans pending findings at endoscopic evaluation.     Demita Tobia EsterwoodPA-C  03/26/2021, 8:50 AM

## 2021-03-26 NOTE — ED Notes (Signed)
Assisted patient to the bathroom. 

## 2021-03-26 NOTE — Hospital Course (Signed)
87 year old woman presented with painless rectal bleeding.  GI plans endoscopy 5/4.  Hemoglobin stable thus far.  A & P  Painless rectal bleeding.  CT possible rectal mucosal lesion. --Hemoglobin stable thus far.  Hemodynamic stable. --Trend hemoglobin.  Diet as per GI, n.p.o. after midnight for colonoscopy tomorrow.  Multiple pulmonary nodules right lung base typically benign per radiology, however if rectal lesion is significant, may require further evaluation  Aortic atherosclerosis --No treatment indicated at this time.  Follow-up as an outpatient.

## 2021-03-27 ENCOUNTER — Encounter (HOSPITAL_COMMUNITY)
Admission: EM | Disposition: A | Discharge status: Home / Self Care | Payer: Self-pay | Source: Home / Self Care | Attending: Internal Medicine

## 2021-03-27 ENCOUNTER — Ambulatory Visit: Payer: Medicare Other | Admitting: Nurse Practitioner

## 2021-03-27 ENCOUNTER — Encounter (HOSPITAL_COMMUNITY): Payer: Self-pay | Admitting: Family Medicine

## 2021-03-27 DIAGNOSIS — K635 Polyp of colon: Secondary | ICD-10-CM

## 2021-03-27 DIAGNOSIS — D490 Neoplasm of unspecified behavior of digestive system: Secondary | ICD-10-CM

## 2021-03-27 DIAGNOSIS — K625 Hemorrhage of anus and rectum: Secondary | ICD-10-CM | POA: Diagnosis not present

## 2021-03-27 HISTORY — PX: POLYPECTOMY: SHX5525

## 2021-03-27 HISTORY — PX: BIOPSY: SHX5522

## 2021-03-27 HISTORY — PX: COLONOSCOPY WITH PROPOFOL: SHX5780

## 2021-03-27 LAB — HEMOGLOBIN AND HEMATOCRIT, BLOOD
HCT: 41.8 % (ref 36.0–46.0)
HCT: 40.4 % (ref 36.0–46.0)
Hemoglobin: 13.4 g/dL (ref 12.0–15.0)
Hemoglobin: 12.9 g/dL (ref 12.0–15.0)

## 2021-03-27 LAB — BASIC METABOLIC PANEL
Anion gap: 13 (ref 5–15)
BUN: 13 mg/dL (ref 8–23)
CO2: 20 mmol/L — ABNORMAL LOW (ref 22–32)
Calcium: 8.5 mg/dL — ABNORMAL LOW (ref 8.9–10.3)
Chloride: 106 mmol/L (ref 98–111)
Creatinine, Ser: 0.97 mg/dL (ref 0.44–1.00)
GFR, Estimated: 57 mL/min — ABNORMAL LOW (ref >60)
Glucose, Bld: 101 mg/dL — ABNORMAL HIGH (ref 70–99)
Potassium: 4.1 mmol/L (ref 3.5–5.1)
Sodium: 139 mmol/L (ref 135–145)

## 2021-03-27 LAB — CBC
HCT: 41.3 % (ref 36.0–46.0)
Hemoglobin: 13.7 g/dL (ref 12.0–15.0)
MCH: 31.1 pg (ref 26.0–34.0)
MCHC: 33.2 g/dL (ref 30.0–36.0)
MCV: 93.9 fL (ref 80.0–100.0)
Platelets: 224 10*3/uL (ref 150–400)
RBC: 4.4 MIL/uL (ref 3.87–5.11)
RDW: 12.9 % (ref 11.5–15.5)
WBC: 4.7 10*3/uL (ref 4.0–10.5)
nRBC: 0 % (ref 0.0–0.2)

## 2021-03-27 SURGERY — COLONOSCOPY WITH PROPOFOL
Anesthesia: Moderate Sedation

## 2021-03-27 MED ORDER — MIDAZOLAM HCL (PF) 5 MG/ML IJ SOLN
INTRAMUSCULAR | Status: AC
  Filled 2021-03-27: qty 3

## 2021-03-27 MED ORDER — DIPHENHYDRAMINE HCL 50 MG/ML IJ SOLN
25.0000 mg | Freq: Once | INTRAMUSCULAR | Status: AC
  Administered 2021-03-27: 25 mg via INTRAVENOUS

## 2021-03-27 MED ORDER — FENTANYL CITRATE (PF) 100 MCG/2ML IJ SOLN
INTRAMUSCULAR | Status: DC | PRN
  Administered 2021-03-27 (×2): 25 ug via INTRAVENOUS
  Administered 2021-03-27: 50 ug via INTRAVENOUS

## 2021-03-27 MED ORDER — MIDAZOLAM HCL 5 MG/5ML IJ SOLN
INTRAMUSCULAR | Status: DC | PRN
  Administered 2021-03-27: 1 mg via INTRAVENOUS
  Administered 2021-03-27: 0.5 mg via INTRAVENOUS
  Administered 2021-03-27: 2 mg via INTRAVENOUS
  Administered 2021-03-27: 1 mg via INTRAVENOUS

## 2021-03-27 MED ORDER — SODIUM CHLORIDE 0.9 % IV SOLN: INTRAVENOUS | Status: DC

## 2021-03-27 MED ORDER — FENTANYL CITRATE (PF) 100 MCG/2ML IJ SOLN
INTRAMUSCULAR | Status: AC
  Filled 2021-03-27: qty 4

## 2021-03-27 MED ORDER — DIPHENHYDRAMINE HCL 50 MG/ML IJ SOLN
INTRAMUSCULAR | Status: AC
  Filled 2021-03-27: qty 1

## 2021-03-27 SURGICAL SUPPLY — 22 items

## 2021-03-27 NOTE — Op Note (Signed)
[facility] Patient Name: Peggy Howard Procedure Date: 03/27/2021 MRN: [id number] Attending MD: Justice Britain , MD Date of Birth: Jun 07, 1933 CSN: [id number] Age: 87 Admit Type: Outpatient Procedure:                Colonoscopy Indications:              Hematochezia, Rectal bleeding, Abnormal CT of the                            GI tract Providers:                Justice Britain, MD, Kary Kos RN, RN, Tyna Jaksch Technician Referring MD:              Medicines:                Fentanyl 100 micrograms IV, Midazolam 4.5 mg IV,                            Diphenhydramine 25 mg IV Complications:            No immediate complications. Estimated Blood Loss:     Estimated blood loss was minimal. Procedure:                Pre-Anesthesia Assessment:                           - Prior to the procedure, a History and Physical                            was performed, and patient medications and                            allergies were reviewed. The patient's tolerance of                            previous anesthesia was also reviewed. The risks                            and benefits of the procedure and the sedation                            options and risks were discussed with the patient.                            All questions were answered, and informed consent                            was obtained. Prior Anticoagulants: The patient has                            taken no previous anticoagulant or antiplatelet                            agents.  ASA Grade Assessment: III - A patient with                            severe systemic disease. After reviewing the risks                            and benefits, the patient was deemed in                            satisfactory condition to undergo the procedure.                           After obtaining informed consent, the colonoscope                            was passed under direct  vision. Throughout the                            procedure, the patient's blood pressure, pulse, and                            oxygen saturations were monitored continuously. The                            PCF-H190DL ([id number] Olympus pediatric colonscope                            was introduced through the anus and advanced to the                            the cecum, identified by appendiceal orifice and                            ileocecal valve. The colonoscopy was performed                            without difficulty. The patient tolerated the                            procedure. The quality of the bowel preparation was                            good. The ileocecal valve, appendiceal orifice, and                            rectum were photographed. Scope In: 2:40:51 PM Scope Out: 3:12:09 PM Scope Withdrawal Time: 0 hours 21 minutes 53 seconds  Total Procedure Duration: 0 hours 31 minutes 18 seconds  Findings:      The digital rectal exam findings include palpable rectal nodularity and       hemorrhoids.      A 4 mm polyp was found in the cecum. The polyp was sessile. The polyp       was removed with a cold snare. Resection and retrieval were complete.  A few small-mouthed diverticula were found in the cecum.      Many small-mouthed diverticula were found in the recto-sigmoid colon.      An ulcerated non-obstructing medium-sized mass-like area was found in       the mid rectum at 7 cm. The lesion was partially circumferential       (involving one-third of the lumen circumference). It measured measured       two cm in length. Oozing and evidence of recent bleeding was present. It       is not clearly a malignant lesion endoscopically and the query of       possible rectal prolapse should be considered as well. Biopsies were       taken with a cold forceps for histology to rule out malignancy.      Normal mucosa was found in the entire colon otherwise.      Non-bleeding  non-thrombosed external and internal hemorrhoids were found       during perianal exam, during digital exam and during endoscopy. The       hemorrhoids were Grade II (internal hemorrhoids that prolapse but reduce       spontaneously). Impression:               - Palpable rectal nodularity and hemorrhoids found                            on digital rectal exam.                           - One 4 mm polyp in the cecum, removed with a cold                            snare. Resected and retrieved.                           - Diverticulosis in the cecum.                           - Diverticulosis in the recto-sigmoid colon.                           - Rule out malignancy, mass-like lesion in the mid                            rectum. Biopsied.                           - Normal mucosa in the entire examined colon                            otherwise.                           - Non-bleeding non-thrombosed external and internal                            hemorrhoids. Moderate Sedation:      Moderate (conscious) sedation was administered by the endoscopy nurse       and supervised  by the endoscopist. The following parameters were       monitored: oxygen saturation, heart rate, blood pressure, and response       to care. Total physician intraservice time was 45 minutes. Recommendation:           - The patient will be observed post-procedure,                            until all discharge criteria are met.                           - Return patient to hospital ward for ongoing care.                           - Patient has a contact number available for                            emergencies. The signs and symptoms of potential                            delayed complications were discussed with the                            patient. Return to normal activities tomorrow.                            Written discharge instructions were provided to the                            patient.                            - High fiber diet.                           - Await pathology results.                           - Use Canasa 1000 mg suppository 1 per rectum QHS                            if this turns out to be inflammation driven, but                            low downside so will begin tonight.                           - If this is non-malignant, but she continues to                            have bleeding with conservative measures, then                            Colorectal surgery evaluation will be necessary.                           -  If this is non-malignant, then repeat endoscopic                            evaluation to ensure healing will need to be                            considered, if she is doing well                            (Flex-Sigmoidoscopy within the coming weeks TBD).                           - The findings and recommendations were discussed                            with the patient.                           - The findings and recommendations were discussed                            with the patient's family. Procedure Code(s):        --- Professional ---                           [phone], Colonoscopy, flexible; with removal of                            tumor(s), polyp(s), or other lesion(s) by snare                            technique                           [id number], 59, Colonoscopy, flexible; with biopsy,                            single or multiple                           99153, Moderate sedation; each additional 15                            minutes intraservice time                           99153, Moderate sedation; each additional 15                            minutes intraservice time                           G0500, Moderate sedation services provided by the                            same physician or other qualified health care  professional performing a gastrointestinal                            endoscopic  service that sedation supports,                            requiring the presence of an independent trained                            observer to assist in the monitoring of the                            patient's level of consciousness and physiological                            status; initial 15 minutes of intra-service time;                            patient age 52 years or older (additional time may                            be reported with [phone], as appropriate) Diagnosis Code(s):        --- Professional ---                           K62.89, Other specified diseases of anus and rectum                           K63.5, Polyp of colon                           K64.1, Second degree hemorrhoids                           D49.0, Neoplasm of unspecified behavior of                            digestive system                           K92.1, Melena (includes Hematochezia)                           K62.5, Hemorrhage of anus and rectum                           K57.30, Diverticulosis of large intestine without                            perforation or abscess without bleeding                           R93.3, Abnormal findings on diagnostic imaging of                            other parts of digestive tract  CPT copyright 2019 [employer]. All rights reserved. The codes documented in this report are preliminary and upon coder review may  be revised to meet current compliance requirements. Justice Britain, MD 03/27/2021 4:01:28 PM Number of Addenda: 0

## 2021-03-27 NOTE — Interval H&P Note (Signed)
History and Physical Interval Note:  03/27/2021 1:57 PM  Peggy Howard  has presented today for surgery, with the diagnosis of rectal bleeding.  The various methods of treatment have been discussed with the patient and family. After consideration of risks, benefits and other options for treatment, the patient has consented to  Procedure(s): COLONOSCOPY WITH PROPOFOL (N/A) as a surgical intervention.  The patient's history has been reviewed, patient examined, no change in status, stable for surgery.  I have reviewed the patient's chart and labs.  Questions were answered to the patient's satisfaction.    This will be a colonoscopy with moderate sedation as a result of no further anesthesia availability.  Will be increased risk but will go slow and try to minimize risk as much as possible in setting of persistent bleeding.  The risks and benefits of endoscopic evaluation were discussed with the patient; these include but are not limited to the risk of perforation, infection, bleeding, missed lesions, lack of diagnosis, severe illness requiring hospitalization, as well as anesthesia and sedation related illnesses.  The patient is agreeable to proceed.     [employer]

## 2021-03-27 NOTE — Progress Notes (Signed)
  PROGRESS NOTE  Peggy Howard FOY:[id number] DOB: 05/19/1933 DOA: 03/25/2021 PCP: Lorrene Reid, PA-C  Brief History   87 year old woman presented with painless rectal bleeding.  GI plans endoscopy 5/4.  Hemoglobin stable thus far.  A & P    Painless rectal bleeding.   - CT shows possible rectal mucosal lesion. - Hemoglobin stable - GI following - scope planned later today; dispo/plan pending findings.   Multiple pulmonary nodules right lung base typically benign per radiology, however if rectal lesion is significant, may require further evaluation  Aortic atherosclerosis -No treatment indicated at this time.  Follow-up as an outpatient.   Disposition Plan:  Status is: Inpatient Remains inpatient appropriate because:Ongoing diagnostic testing needed not appropriate for outpatient work up and Inpatient level of care appropriate due to severity of illness Dispo: The patient is from: Home              Anticipated d/c is to: Home              Patient currently is not medically stable to d/c.   Difficult to place patient No  DVT prophylaxis: SCDs Start: 03/25/21 2109   Code Status: Full Code Level of care: Med-Surg Family Communication: daughter at bedside  [facility] Direct contact: epic chat 7PM-7AM contact night coverage as at bottom of note 03/27/2021, 7:40 AM  LOS: 2 days   Significant Hospital Events   . 5/2 admit for GIB   Consults:  . Gastroenterology    Procedures:  . Colonoscopy planned 03/27/21  Significant Diagnostic Tests:  . 5/2 CT abdomen pelvis short segment rectal wall thickening cannot exclude rectal mucosal lesion.  Multiple pulmonary nodules favored to be benign.   Micro Data:  . COVID negative   Antimicrobials:  . None   Interval History/Subjective  No acute issues/events overnight - denies any further episodes of bleeding today.  Objective   Vitals:  Vitals:   03/27/21 0125 03/27/21 0438  BP: 135/78  (!) 144/71  Pulse: 79 78  Resp: 20 18  Temp: 98.5 F (36.9 C) 98 F (36.7 C)  SpO2: 92% 93%    Exam:  Constitutional:   . Appears calm and comfortable ENMT:  . grossly normal hearing  Respiratory:  . CTA bilaterally, no w/r/r.  . Respiratory effort normal.  Cardiovascular:  . RRR, no m/r/g . No LE extremity edema   Psychiatric:  . Mental status o Mood, affect appropriate  Scheduled Meds: . pantoprazole (PROTONIX) IV  40 mg Intravenous Q12H  . sodium chloride flush  3 mL Intravenous Q12H   Continuous Infusions: . lactated ringers Stopped (03/26/21 0052)    Principal Problem:   BRBPR (bright red blood per rectum) Active Problems:   Low back pain   GIB (gastrointestinal bleeding)   LOS: 2 days

## 2021-03-28 ENCOUNTER — Telehealth: Payer: Self-pay

## 2021-03-28 ENCOUNTER — Encounter: Payer: Self-pay | Admitting: Gastroenterology

## 2021-03-28 ENCOUNTER — Encounter (HOSPITAL_COMMUNITY): Payer: Self-pay | Admitting: Gastroenterology

## 2021-03-28 DIAGNOSIS — K623 Rectal prolapse: Secondary | ICD-10-CM

## 2021-03-28 DIAGNOSIS — D509 Iron deficiency anemia, unspecified: Secondary | ICD-10-CM

## 2021-03-28 DIAGNOSIS — K626 Ulcer of anus and rectum: Principal | ICD-10-CM

## 2021-03-28 DIAGNOSIS — K625 Hemorrhage of anus and rectum: Secondary | ICD-10-CM | POA: Diagnosis not present

## 2021-03-28 LAB — HEMOGLOBIN AND HEMATOCRIT, BLOOD
HCT: 42.2 % (ref 36.0–46.0)
Hemoglobin: 13.5 g/dL (ref 12.0–15.0)

## 2021-03-28 LAB — SURGICAL PATHOLOGY

## 2021-03-28 MED ORDER — MESALAMINE 1000 MG RE SUPP: 1000.0000 mg | Freq: Every day | RECTAL | Status: DC

## 2021-03-28 MED ORDER — MESALAMINE 1000 MG RE SUPP: 1000.0000 mg | Freq: Every day | RECTAL | 0 refills | Status: DC

## 2021-03-28 NOTE — Progress Notes (Signed)
Peggy Howard Progress Note  CC:  Rectal bleeding  Subjective:  Still with just a small amount of bleeding.  Says that her stools still are not solid.  Otherwise she feels good and would like to go home.  Colonoscopy 5/4 showed the following:  - Palpable rectal nodularity and hemorrhoids found on digital rectal exam. - One 4 mm polyp in the cecum, removed with a cold snare. Resected and retrieved. - Diverticulosis in the cecum. - Diverticulosis in the recto-sigmoid colon. - Rule out malignancy, mass-like lesion in the mid rectum. Biopsied. - Normal mucosa in the entire examined colon otherwise. - Non-bleeding non-thrombosed external and internal hemorrhoids.  Objective:  Vital signs in last 24 hours: Temp:  [97.8 F (36.6 C)-98.6 F (37 C)] 98.6 F (37 C) (05/05 0459) Pulse Rate:  [67-97] 97 (05/05 0459) Resp:  [8-22] 18 (05/05 0459) BP: (122-214)/(45-101) 142/85 (05/05 0459) SpO2:  [92 %-100 %] 92 % (05/05 0459) Weight:  [81.6 kg] 81.6 kg (05/04 1344) Last BM Date: 03/27/21 General:  Alert, Well-developed, in NAD Heart:  Regular rate and rhythm; no murmurs Pulm:  CTAB.  No W/R/R. Abdomen:  Soft, non-distended.  BS present.  Non-tender. Extremities:  Without edema. Neurologic:  Alert and oriented x 4;  grossly normal neurologically. Psych:  Alert and cooperative. Normal mood and affect.  Intake/Output from previous day: 05/04 0701 - 05/05 0700 In: 1342.8 [P.O.:236; I.V.:1106.8] Out: -  Intake/Output this shift: Total I/O In: 590 [P.O.:590] Out: -   Lab Results: Recent Labs    03/25/21 1612 03/25/21 2230 03/26/21 1626 03/26/21 2225 03/27/21 0212 03/27/21 1053 03/27/21 2059 03/28/21 0527  WBC 5.2  --  3.1*  --  4.7  --   --   --   HGB 14.2   < > 13.6   < > 13.7 13.4 12.9 13.5  HCT 43.6   < > 42.1   < > 41.3 41.8 40.4 42.2  PLT 277  --  241  --  224  --   --   --    < > = values in this interval not displayed.   BMET Recent Labs     03/25/21 1612 03/27/21 0212  NA 138 139  K 4.3 4.1  CL 103 106  CO2 26 20*  GLUCOSE 112* 101*  BUN 17 13  CREATININE 0.95 0.97  CALCIUM 9.2 8.5*   LFT Recent Labs    03/25/21 1612  PROT 7.4  ALBUMIN 4.2  AST 26  ALT 14  ALKPHOS 78  BILITOT 0.8   Assessment / Plan: #78 87 year old white female presenting with bright red blood per rectum. She also had about a 2-week history of low back pain which she describes as sharp and crampy, she cannot definitely appreciate any rectal pain, has had some lower abdominal cramping as well and looser stools with urgency and some incontinence.  CT suggested short segment of rectal wall thickening cannot exclude mucosal lesion.  Colonoscopy showed mass-like lesion in the mid rectum.  Biopsies showed the following:  - Erosion with hemorrhage and reactive changes consistent with mucosal  prolapse (solitary rectal ulcer).  - No adenomatous change or carcinoma.   Hgb stable at 13.5 grams.  #2 diverticulosis #3 multiple gallstones  *She is ok for discharge from a GI standpoint.  I have ordered Canasa suppositories and she should be discharged with a prescription of those to be used at bedtime for the next few weeks. *She should also begin using  Benefiber powder, which may help to bulk her stools. *Our office will contact her for follow-up as she may need repeat flex sig in several weeks to assess healing.   LOS: 3 days   Peggy Howard. Peggy Howard  03/28/2021, 10:29 AM

## 2021-03-28 NOTE — Discharge Summary (Signed)
Physician Discharge Summary  Peggy Howard HDQ:[id number] DOB: Oct 12, 1933 DOA: 03/25/2021  PCP: Lorrene Reid, PA-C  Admit date: 03/25/2021 Discharge date: 03/28/2021  Admitted From: Home Disposition: Home  Recommendations for Outpatient Follow-up:  1. Follow up with PCP in 1-2 weeks 2. Please obtain BMP/CBC in one week 3. Please follow up with GI as scheduled  Home Health: None Equipment/Devices: None  Discharge Condition: Stable CODE STATUS: Full Diet recommendation: As tolerated  Brief/Interim Summary: Patient is a pleasant 87 year old female who presents with painless rectal bleeding.  Status post endoscopy on 03/27/2021 shows masslike lesion in the mid rectum -fortunately biopsies showed erosion with hemorrhage and reactive changes consistent with mucosal prolapse without any adenomatous or carcinomatous changes.  Her hemoglobin is remained remarkably stable throughout her hospitalization, otherwise will discharge per GI recommendations with mesalamine suppositories but no other new medications or medication changes.  Patient otherwise stable and agreeable for discharge home.  Discharge Diagnoses:  Principal Problem:   BRBPR (bright red blood per rectum) Active Problems:   Low back pain   GIB (gastrointestinal bleeding)    Discharge Instructions  Discharge Instructions    Call MD for:  extreme fatigue   Complete by: As directed    Call MD for:  persistant dizziness or light-headedness   Complete by: As directed    Diet - low sodium heart healthy   Complete by: As directed    Increase activity slowly   Complete by: As directed      Allergies as of 03/28/2021      Reactions   Vitamin D Analogs    "dizziness"      Medication List    TAKE these medications   acetaminophen 500 MG tablet Commonly known as: TYLENOL Take 1,000 mg by mouth every 6 (six) hours as needed for mild pain, fever or headache.   mesalamine 1000 MG suppository Commonly known as:  CANASA Place 1 suppository (1,000 mg total) rectally at bedtime.   multivitamin with minerals Tabs tablet Take 1 tablet by mouth daily.   PRESERVISION AREDS 2 PO Take 1 tablet by mouth 2 (two) times daily.       Allergies  Allergen Reactions  . Vitamin D Analogs     "dizziness"    Consultations:  GI   Procedures/Studies: DG Lumbar Spine 2-3 Views  Result Date: 03/25/2021 CLINICAL DATA:  Back pain with no known injury EXAM: LUMBAR SPINE - 2-3 VIEW COMPARISON:  None. FINDINGS: There are advanced multi level degenerative changes throughout the lumbar spine with multilevel severe disc height loss and facet arthrosis. There is no definite acute compression fracture. Aortic calcifications are noted. IMPRESSION: Advanced degenerative changes of the lumbar spine without evidence for an acute osseous abnormality. Electronically Signed   By: Constance Holster M.D.   On: 03/25/2021 21:48   CT Abdomen Pelvis W Contrast  Result Date: 03/25/2021 CLINICAL DATA:  Abdominal distension.  Rectal bleeding. EXAM: CT ABDOMEN AND PELVIS WITH CONTRAST TECHNIQUE: Multidetector CT imaging of the abdomen and pelvis was performed using the standard protocol following bolus administration of intravenous contrast. CONTRAST:  <See Chart> OMNIPAQUE IOHEXOL 300 MG/ML  SOLN COMPARISON:  None. FINDINGS: Lower chest: 4 mm nodule in the RIGHT middle lobe (image 4/ 4). Several additional nodules over the RIGHT hemidiaphragm (image 13/4 and 36/4 ) Hepatobiliary: Small hypodense lesion in the LEFT lateral hepatic lobe measuring 3 mm cannot be fully characterize. No biliary duct dilatation. Gallbladder is distended to 4.3 cm. There several dependent gallstones. No evidence  acute inflammation. Pancreas: Pancreas is normal. No ductal dilatation. No pancreatic inflammation. Spleen: Normal spleen Adrenals/urinary tract: Adrenal glands, kidneys and bladder normal. Stomach/Bowel: Stomach, small-bowel and cecum are normal. The  appendix is not identified but there is no pericecal inflammation to suggest appendicitis. Multiple diverticula of the descending colon and sigmoid colon without acute inflammation. There is nonspecific thickening through a short segment of rectum with with luminal collapse on image 79/2. Region measures 3 cm on sagittal image 103/series 6 . No mesorectal adenopathy Vascular/Lymphatic: Abdominal aorta is normal caliber. No periportal or retroperitoneal adenopathy. No pelvic adenopathy. Reproductive: Post hysterectomy.  Adnexa unremarkable Other: No free fluid. Musculoskeletal: No aggressive osseous lesion. IMPRESSION: 1. Short segment of rectal wall thickening and luminal collapse. Cannot exclude a rectal mucosal lesion. Consider flexible sigmoidoscopy for further evaluation. 2. Multiple pulmonary nodules at the RIGHT lung base which are predominately pleural base which is typically a benign characteristic. Lesions may need further evaluation in patient with potential rectal lesion. 3. LEFT colon diverticulosis without diverticulitis. 4. Multiple gallstones without evidence acute cholecystitis. 5.  Aortic Atherosclerosis (ICD10-I70.0). Electronically Signed   By: Suzy Bouchard M.D.   On: 03/25/2021 18:34     Subjective: No acute issues or events overnight denies nausea vomiting diarrhea constipation headache fevers or chills.   Discharge Exam: Vitals:   03/27/21 2001 03/28/21 0459  BP: (!) 154/83 (!) 142/85  Pulse: 77 97  Resp: 20 18  Temp: 98 F (36.7 C) 98.6 F (37 C)  SpO2: 93% 92%   Vitals:   03/27/21 1550 03/27/21 1613 03/27/21 2001 03/28/21 0459  BP: (!) 189/79 (!) 171/101 (!) 154/83 (!) 142/85  Pulse: 72 77 77 97  Resp: 18 17 20 18   Temp:  98 F (36.7 C) 98 F (36.7 C) 98.6 F (37 C)  TempSrc:  Oral Oral Oral  SpO2: 94% 94% 93% 92%  Weight:      Height:        General: Pt is alert, awake, not in acute distress Cardiovascular: RRR, S1/S2 +, no rubs, no gallops Respiratory:  CTA bilaterally, no wheezing, no rhonchi Abdominal: Soft, NT, ND, bowel sounds + Extremities: no edema, no cyanosis    The results of significant diagnostics from this hospitalization (including imaging, microbiology, ancillary and laboratory) are listed below for reference.     Microbiology: Recent Results (from the past 240 hour(s))  SARS CORONAVIRUS 2 (TAT 6-24 HRS) Nasopharyngeal Nasopharyngeal Swab     Status: None   Collection Time: 03/25/21 10:45 PM   Specimen: Nasopharyngeal Swab  Result Value Ref Range Status   SARS Coronavirus 2 NEGATIVE NEGATIVE Final    Comment: (NOTE) SARS-CoV-2 target nucleic acids are NOT DETECTED.  The SARS-CoV-2 RNA is generally detectable in upper and lower respiratory specimens during the acute phase of infection. Negative results do not preclude SARS-CoV-2 infection, do not rule out co-infections with other pathogens, and should not be used as the sole basis for treatment or other patient management decisions. Negative results must be combined with clinical observations, patient history, and epidemiological information. The expected result is Negative.  Fact Sheet for Patients: SugarRoll.be  Fact Sheet for Healthcare Providers: https://www.woods-mathews.com/  This test is not yet approved or cleared by the [place] FDA and  has been authorized for detection and/or diagnosis of SARS-CoV-2 by FDA under an Emergency Use Authorization (EUA). This EUA will remain  in effect (meaning this test can be used) for the duration of the COVID-19 declaration under Se ction  564(b)(1) of the Act, 21 U.S.C. section 360bbb-3(b)(1), unless the authorization is terminated or revoked sooner.  Performed at [facility], [place] [address], [place], [place] [address]      Labs: BNP (last 3 results) No results for input(s): BNP in the last 8760 hours. Basic Metabolic Panel: Recent Labs  Lab 03/22/21 0942  03/25/21 1612 03/27/21 0212  NA 142 138 139  K 4.5 4.3 4.1  CL 106 103 106  CO2 27 26 20*  GLUCOSE 132* 112* 101*  BUN 21 17 13   CREATININE 0.84 0.95 0.97  CALCIUM 9.1 9.2 8.5*   Liver Function Tests: Recent Labs  Lab 03/22/21 0942 03/25/21 1612  AST 20 26  ALT 13 14  ALKPHOS 75 78  BILITOT 1.0 0.8  PROT 6.6 7.4  ALBUMIN 3.8 4.2   Recent Labs  Lab 03/25/21 1612  LIPASE 50   No results for input(s): AMMONIA in the last 168 hours. CBC: Recent Labs  Lab 03/22/21 0942 03/25/21 1612 03/25/21 2230 03/26/21 1626 03/26/21 2225 03/27/21 0212 03/27/21 1053 03/27/21 2059 03/28/21 0527  WBC 6.9 5.2  --  3.1*  --  4.7  --   --   --   NEUTROABS 4.7 4.1  --   --   --   --   --   --   --   HGB 14.2 14.2   < > 13.6 15.1* 13.7 13.4 12.9 13.5  HCT 43.7 43.6   < > 42.1 46.9* 41.3 41.8 40.4 42.2  MCV 96.7 96.0  --  96.1  --  93.9  --   --   --   PLT 281 277  --  241  --  224  --   --   --    < > = values in this interval not displayed.   Cardiac Enzymes: No results for input(s): CKTOTAL, CKMB, CKMBINDEX, TROPONINI in the last 168 hours. BNP: Invalid input(s): POCBNP CBG: No results for input(s): GLUCAP in the last 168 hours. D-Dimer No results for input(s): DDIMER in the last 72 hours. Hgb A1c No results for input(s): HGBA1C in the last 72 hours. Lipid Profile No results for input(s): CHOL, HDL, LDLCALC, TRIG, CHOLHDL, LDLDIRECT in the last 72 hours. Thyroid function studies No results for input(s): TSH, T4TOTAL, T3FREE, THYROIDAB in the last 72 hours.  Invalid input(s): FREET3 Anemia work up No results for input(s): VITAMINB12, FOLATE, FERRITIN, TIBC, IRON, RETICCTPCT in the last 72 hours. Urinalysis    Component Value Date/Time   COLORURINE YELLOW 03/25/2021 1537   APPEARANCEUR HAZY (A) 03/25/2021 1537   LABSPEC 1.027 03/25/2021 1537   PHURINE 6.0 03/25/2021 1537   GLUCOSEU NEGATIVE 03/25/2021 1537   HGBUR MODERATE (A) 03/25/2021 1537   BILIRUBINUR NEGATIVE  03/25/2021 1537   KETONESUR NEGATIVE 03/25/2021 1537   PROTEINUR NEGATIVE 03/25/2021 1537   NITRITE NEGATIVE 03/25/2021 1537   LEUKOCYTESUR NEGATIVE 03/25/2021 1537   Sepsis Labs Invalid input(s): PROCALCITONIN,  WBC,  LACTICIDVEN Microbiology Recent Results (from the past 240 hour(s))  SARS CORONAVIRUS 2 (TAT 6-24 HRS) Nasopharyngeal Nasopharyngeal Swab     Status: None   Collection Time: 03/25/21 10:45 PM   Specimen: Nasopharyngeal Swab  Result Value Ref Range Status   SARS Coronavirus 2 NEGATIVE NEGATIVE Final    Comment: (NOTE) SARS-CoV-2 target nucleic acids are NOT DETECTED.  The SARS-CoV-2 RNA is generally detectable in upper and lower respiratory specimens during the acute phase of infection. Negative results do not preclude SARS-CoV-2 infection, do  not rule out co-infections with other pathogens, and should not be used as the sole basis for treatment or other patient management decisions. Negative results must be combined with clinical observations, patient history, and epidemiological information. The expected result is Negative.  Fact Sheet for Patients: SugarRoll.be  Fact Sheet for Healthcare Providers: https://www.woods-mathews.com/  This test is not yet approved or cleared by the [place] FDA and  has been authorized for detection and/or diagnosis of SARS-CoV-2 by FDA under an Emergency Use Authorization (EUA). This EUA will remain  in effect (meaning this test can be used) for the duration of the COVID-19 declaration under Se ction 564(b)(1) of the Act, 21 U.S.C. section 360bbb-3(b)(1), unless the authorization is terminated or revoked sooner.  Performed at [facility], [place] [address], [place], [place] [address]      Time coordinating discharge: Over 30 minutes  SIGNED:   Little Ishikawa, DO [facility] 03/28/2021, 1:12 PM Pager   If 7PM-7AM, please contact  night-coverage [email or web address]

## 2021-03-28 NOTE — Telephone Encounter (Signed)
Message sent to the schedulers to get pt scheduled.  Labs entered.  Will notify pt.

## 2021-03-28 NOTE — Progress Notes (Addendum)
Pt. Discharged via wheelchair accompanied by staff. Pt. Is alert and oriented, no distress. Discharge instructions and education discussed with patient. Pt. Had verbalized understanding. All personal belongings are with the patient.

## 2021-03-28 NOTE — Telephone Encounter (Signed)
-----   Message from Loralie Champagne, PA-C sent at 03/28/2021  1:44 PM EDT ----- Would you please make this patient an outpatient follow-up with Amy, me, or Dr. Rush Landmark in 3-4 weeks.  She also needs a CBC, ferritin, and other iron studies next week per Dr. Rush Landmark.  He told me about these labs after she was already seen today so would you please let her know to come in to the office for them?  She should be discharged later today.    Thank you,  Jess

## 2021-03-28 NOTE — Plan of Care (Signed)

## 2021-03-28 NOTE — Discharge Instructions (Signed)
Recommend starting Benefiber powder, two teaspoons mixed in 8 ounces of liquid daily and increasing to twice daily if tolerated/needed.

## 2021-03-29 NOTE — Telephone Encounter (Signed)
The pt's son has been advised to have the pt come in next week for labs.  Orders entered. I tried to set up appt with GM for 1 month however he wants to call back to set up

## 2021-04-03 ENCOUNTER — Emergency Department (HOSPITAL_COMMUNITY)
Admission: EM | Admit: 2021-04-03 | Discharge: 2021-04-03 | Disposition: A | Discharge status: Home / Self Care | Payer: Medicare Other | Attending: Emergency Medicine | Admitting: Emergency Medicine

## 2021-04-03 ENCOUNTER — Other Ambulatory Visit: Payer: Self-pay

## 2021-04-03 ENCOUNTER — Encounter (HOSPITAL_COMMUNITY): Payer: Self-pay | Admitting: *Deleted

## 2021-04-03 DIAGNOSIS — K623 Rectal prolapse: Secondary | ICD-10-CM

## 2021-04-03 DIAGNOSIS — K625 Hemorrhage of anus and rectum: Secondary | ICD-10-CM | POA: Insufficient documentation

## 2021-04-03 DIAGNOSIS — Z96651 Presence of right artificial knee joint: Secondary | ICD-10-CM | POA: Insufficient documentation

## 2021-04-03 DIAGNOSIS — Z96652 Presence of left artificial knee joint: Secondary | ICD-10-CM | POA: Insufficient documentation

## 2021-04-03 LAB — CBC WITH DIFFERENTIAL/PLATELET
Abs Immature Granulocytes: 0.01 10*3/uL (ref 0.00–0.07)
Basophils Absolute: 0 10*3/uL (ref 0.0–0.1)
Basophils Relative: 1 %
Eosinophils Absolute: 0.1 10*3/uL (ref 0.0–0.5)
Eosinophils Relative: 2 %
HCT: 43.6 % (ref 36.0–46.0)
Hemoglobin: 14.3 g/dL (ref 12.0–15.0)
Immature Granulocytes: 0 %
Lymphocytes Relative: 27 %
Lymphs Abs: 1.7 10*3/uL (ref 0.7–4.0)
MCH: 30.8 pg (ref 26.0–34.0)
MCHC: 32.8 g/dL (ref 30.0–36.0)
MCV: 94 fL (ref 80.0–100.0)
Monocytes Absolute: 0.3 10*3/uL (ref 0.1–1.0)
Monocytes Relative: 6 %
Neutro Abs: 4 10*3/uL (ref 1.7–7.7)
Neutrophils Relative %: 64 %
Platelets: 288 10*3/uL (ref 150–400)
RBC: 4.64 MIL/uL (ref 3.87–5.11)
RDW: 12.5 % (ref 11.5–15.5)
WBC: 6.2 10*3/uL (ref 4.0–10.5)
nRBC: 0 % (ref 0.0–0.2)

## 2021-04-03 LAB — COMPREHENSIVE METABOLIC PANEL
ALT: 16 U/L (ref 0–44)
AST: 29 U/L (ref 15–41)
Albumin: 3.9 g/dL (ref 3.5–5.0)
Alkaline Phosphatase: 68 U/L (ref 38–126)
Anion gap: 11 (ref 5–15)
BUN: 18 mg/dL (ref 8–23)
CO2: 26 mmol/L (ref 22–32)
Calcium: 9 mg/dL (ref 8.9–10.3)
Chloride: 103 mmol/L (ref 98–111)
Creatinine, Ser: 0.6 mg/dL (ref 0.44–1.00)
GFR, Estimated: >60 mL/min (ref >60)
Glucose, Bld: 135 mg/dL — ABNORMAL HIGH (ref 70–99)
Potassium: 4.6 mmol/L (ref 3.5–5.1)
Sodium: 140 mmol/L (ref 135–145)
Total Bilirubin: 1.1 mg/dL (ref 0.3–1.2)
Total Protein: 6.6 g/dL (ref 6.5–8.1)

## 2021-04-03 LAB — PROTIME-INR
INR: 1 (ref 0.8–1.2)
Prothrombin Time: 13.1 seconds (ref 11.4–15.2)

## 2021-04-03 LAB — LIPASE, BLOOD: Lipase: 92 U/L — ABNORMAL HIGH (ref 11–51)

## 2021-04-03 MED ORDER — BISACODYL 5 MG PO TBEC: 5.0000 mg | DELAYED_RELEASE_TABLET | Freq: Two times a day (BID) | ORAL | 0 refills | Status: DC

## 2021-04-03 MED ORDER — METAMUCIL SMOOTH TEXTURE 58.6 % PO POWD: 1.0000 | Freq: Three times a day (TID) | ORAL | 12 refills | Status: DC

## 2021-04-03 NOTE — ED Notes (Signed)
An After Visit Summary was printed and given to the patient. Discharge instructions given and no further questions at this time.  Pt A&Ox4, ambulatory.

## 2021-04-03 NOTE — Progress Notes (Signed)
Patient was hospitalized a few days ago with rectal bleeding. We performed a complete colonoscopy with findings of a tiny adenoma and an ulcerated rectal mass suspicious for rectal prolapse ulcer. Biopsies were compatible with prolapse   Contacted by EDP. Patient returned to ED today due to discomfort from intermittent rectal prolapse and she would like fixed. No prolapse on EDP's exam today. She has difficulty passing BM and defecation is associated with significant discomfort. No significant bleeding. Hgb 14.3.  She has nearly completed the 5 days of Canasa suppositories without any improvement in symptoms.   Recommend 1. Glycerin suppositories as needed ( explained how to use them) 2. Two stool softeners at bedtime.  3. I will send message to Dr. Rush Landmark to inquire about referral to colorectal surgeon. I told patient that our office would contact her in regards to a referral.

## 2021-04-03 NOTE — ED Provider Notes (Signed)
  Physical Exam  BP (!) 158/81   Pulse 67   Temp (!) 97.4 F (36.3 C) (Oral)   Resp 18   SpO2 96%   Physical Exam  ED Course/Procedures     Procedures  MDM  Patient received in signout from Dr. Vallery Ridge.  She has some small amount of rectal prolapse.  She has been followed by [facility].  The [place] PA will be coming to see the patient and should make recommendations prior to discharge.  Discussed with [facility].  They saw patient.  They advised that the patient does need stool softeners.  They will be calling you saw the patient for referral to colorectal surgeon.  They advised patient may be discharged    Pattricia Boss, MD 04/03/21 1656

## 2021-04-03 NOTE — ED Provider Notes (Signed)
[facility] DEPT Provider Note   CSN: [id number] Arrival date & time: 04/03/21  1150     History Chief Complaint  Patient presents with  . Rectal Bleeding    Peggy Howard is a 87 y.o. female.  HPI Patient was admitted to the hospital 9 days ago for rectal bleeding.  At that time, she had a colonoscopy which showed a focus of some mucosal prolapse with potential for solitary rectal ulcer syndrome.  Patient reports that when she sits to have a bowel movement she gets some masslike protrusion from the anus.  She reports is very uncomfortable as well does make it difficult for her to have a bowel movement.  She reports then she also has a lot of difficulty cleaning herself.  She wants something done about it.  No nausea no vomiting.  No syncope no lightheadedness.  Some bleeding with these episodes    History reviewed. No pertinent past medical history.  Patient Active Problem List   Diagnosis Date Noted  . GIB (gastrointestinal bleeding) 03/26/2021  . BRBPR (bright red blood per rectum) 03/25/2021  . Abdominal pain 03/25/2021  . Low back pain 03/25/2021  . Overweight (BMI 25.0-29.9) 07/16/2017  . Colonoscopy refused 07/16/2017  . Refuses treatment 07/16/2017  . Parent refuses immunizations 07/16/2017  . Patient refuses to take medication 07/16/2017  . History of cigarette smoking 06/26/2017  . Other fatigue 06/26/2017  . Memory changes 06/26/2017  . Community acquired pneumonia of right middle lobe of lung   . External hemorrhoids 01/26/2008    Past Surgical History:  Procedure Laterality Date  . ABDOMINAL HYSTERECTOMY    . BIOPSY  03/27/2021   Procedure: BIOPSY;  Surgeon: Irving Copas., MD;  Location: [facility];  Service: Gastroenterology;;  . COLONOSCOPY WITH PROPOFOL N/A 03/27/2021   Procedure: COLONOSCOPY WITH PROPOFOL;  Surgeon: Irving Copas., MD;  Location: [facility];  Service: Gastroenterology;  Laterality:  N/A;  . JOINT REPLACEMENT    . POLYPECTOMY  03/27/2021   Procedure: POLYPECTOMY;  Surgeon: Mansouraty, Telford Nab., MD;  Location: [facility];  Service: Gastroenterology;;  . REPLACEMENT TOTAL KNEE Bilateral      OB History   No obstetric history on file.     No family history on file.  Social History   Tobacco Use  . Smoking status: Never Smoker  . Smokeless tobacco: Never Used  Vaping Use  . Vaping Use: Never used  Substance Use Topics  . Alcohol use: No  . Drug use: No    Home Medications Prior to Admission medications   Medication Sig Start Date End Date Taking? Authorizing Provider  acetaminophen (TYLENOL) 500 MG tablet Take 1,000 mg by mouth every 6 (six) hours as needed for mild pain, fever or headache.    [provider]  mesalamine (CANASA) 1000 MG suppository Place 1 suppository (1,000 mg total) rectally at bedtime. 03/28/21   Little Ishikawa, MD  Multiple Vitamin (MULTIVITAMIN WITH MINERALS) TABS tablet Take 1 tablet by mouth daily.    [provider]  Multiple Vitamins-Minerals (PRESERVISION AREDS 2 PO) Take 1 tablet by mouth 2 (two) times daily.    [provider]    Allergies    Vitamin d analogs  Review of Systems   Review of Systems 10 systems reviewed and negative except as per HPI Physical Exam Updated Vital Signs BP (!) 158/81   Pulse 67   Temp (!) 97.4 F (36.3 C) (Oral)   Resp 18  SpO2 96%   Physical Exam Constitutional:      Appearance: She is well-developed.  HENT:     Head: Normocephalic and atraumatic.  Eyes:     Conjunctiva/sclera: Conjunctivae normal.  Cardiovascular:     Rate and Rhythm: Normal rate and regular rhythm.     Heart sounds: Normal heart sounds.  Pulmonary:     Effort: Pulmonary effort is normal.     Breath sounds: Normal breath sounds.  Abdominal:     General: Bowel sounds are normal. There is no distension.     Palpations: Abdomen is soft.     Tenderness: There is no abdominal  tenderness.  Genitourinary:    Comments: Patient examined in a standing position.  There is no prolapse in a standing position.  There are some nonthrombosed fleshy hemorrhoids.  Internal digital exam is for soft fleshy internal hemorrhoids.  Mucosa is slightly boggy and soft.  There is a fleshy mass about 2 cm from the anal verge.  This does not prolapse significantly in a standing position. Musculoskeletal:        General: Normal range of motion.     Cervical back: Neck supple.  Skin:    General: Skin is warm and dry.  Neurological:     Mental Status: She is alert and oriented to person, place, and time.     GCS: GCS eye subscore is 4. GCS verbal subscore is 5. GCS motor subscore is 6.     Coordination: Coordination normal.    ED Results / Procedures / Treatments   Labs (all labs ordered are listed, but only abnormal results are displayed) Labs Reviewed  COMPREHENSIVE METABOLIC PANEL - Abnormal; Notable for the following components:      Result Value   Glucose, Bld 135 (*)    All other components within normal limits  LIPASE, BLOOD - Abnormal; Notable for the following components:   Lipase 92 (*)    All other components within normal limits  CBC WITH DIFFERENTIAL/PLATELET  PROTIME-INR    EKG None  Radiology No results found.  Procedures Procedures   Medications Ordered in ED Medications - No data to display  ED Course  I have reviewed the triage vital signs and the nursing notes.  Pertinent labs & imaging results that were available during my care of the patient were reviewed by me and considered in my medical decision making (see chart for details).    MDM Rules/Calculators/A&P                          Patient presents as outlined.  She is continue to have discomfort and bleeding with a mucosal prolapse from the rectum.  Blood counts are stable.  Exam shows that the mucosal prolapse is reduced and not emergently constricted.  Nevin Bloodgood from the [facility] gastro[facility]ology  will review the colonoscopy and discussed the patient's options with her.  Definitive plan and disposition will be per gastroenterology recommendations. Final Clinical Impression(s) / ED Diagnoses Final diagnoses:  None    Rx / DC Orders ED Discharge Orders    None       Charlesetta Shanks, MD 04/03/21 1521

## 2021-04-03 NOTE — Discharge Instructions (Addendum)
Please take medications as prescribed to soften your stool GI office should be getting in contact with you to make a referral to surgery

## 2021-04-03 NOTE — ED Triage Notes (Signed)
Pt complains of rectal bleeding x 1 month. She reports she bleeds when having a bowel movement and when standing, bleeding stops when sitting or lying down.

## 2021-04-04 ENCOUNTER — Telehealth: Payer: Self-pay | Admitting: Gastroenterology

## 2021-04-04 NOTE — Telephone Encounter (Signed)
Dr Rush Landmark the pt's daughter n law is calling with concerns that the pt is in severe rectal  pain 10/10 with continued bleeding.  She was seen in the ER yesterday for rectal prolapse.  She is using metamucil and dulcolax as directed.  Please advise.  She is not sure if she should take her back to the ED.

## 2021-04-04 NOTE — Telephone Encounter (Signed)
Inbound call from patient mother in law, Mendel Ryder. Patient had colon procedure 5/4. Went to the ER 5/11 for a rectal prolapse. Patient is still currently having rectal bleeding several hours a day and in a lot pain, rating 10. Patient have been taking Dulcolax tablets 2x a day. Metamucil 3x a day. Mendel Ryder best contact number [phone]

## 2021-04-04 NOTE — Telephone Encounter (Signed)
I spoke with the patients daughter and she agrees that the pt will need ED eval.  She says that her mom has 10/10 pain every time the prolapse reduces.  She has done everything that was recommended and does not seem to be improving.  She has tried the tylenol and ibuprofen without much relief.  She is going to call her brother to take the pt to the ED for eval and hopefully get a surgical eval.  She says she does not want her to be sent back home again today. She would like to know if Dr Rush Landmark can make the recommendation for surgical referral?  Please advise

## 2021-04-04 NOTE — Telephone Encounter (Signed)
Patty, I am sorry to hear this. PG had let me know that patient had come in yesterday with similar complaints. If the pain is so severe, then she will need to come back to the ED and could even require a Pelvic CT or Pelvic MRI. In regards to the bleeding, the biopsies did not end up showing evidence of precancerous changes. For most individuals with rectal prolapse if conservative measures to try and keep the stool soft and not strain do not help then Biofeedback may be required via Pelvic Floor PT.   However, if pain is so severe, then expedited Surgery referral will be necessary. If she cannot take the pain at home (OK to use Tylenol (no more than 3000 mg daily total) or Ibuprofen (no more than 600 mg 3 times daily while eating) we could trial Tramadol 25 mg for a brief period in time). However, re-evaluation in the ED and surgery evaluation could be required. Again, I am sorry to hear about this for her, but as much pain as she is in, likely will require ED evaluation, if she cannot tolerate things.  GM

## 2021-04-05 ENCOUNTER — Other Ambulatory Visit: Payer: Self-pay

## 2021-04-05 ENCOUNTER — Emergency Department (HOSPITAL_COMMUNITY)
Admission: EM | Admit: 2021-04-05 | Discharge: 2021-04-05 | Disposition: A | Discharge status: Home / Self Care | Payer: Medicare Other | Attending: Emergency Medicine | Admitting: Emergency Medicine

## 2021-04-05 ENCOUNTER — Encounter (HOSPITAL_COMMUNITY): Payer: Self-pay

## 2021-04-05 DIAGNOSIS — Z96653 Presence of artificial knee joint, bilateral: Secondary | ICD-10-CM | POA: Diagnosis not present

## 2021-04-05 DIAGNOSIS — K623 Rectal prolapse: Secondary | ICD-10-CM | POA: Insufficient documentation

## 2021-04-05 HISTORY — DX: Rectal prolapse: K62.3

## 2021-04-05 LAB — COMPREHENSIVE METABOLIC PANEL
ALT: 17 U/L (ref 0–44)
AST: 26 U/L (ref 15–41)
Albumin: 3.8 g/dL (ref 3.5–5.0)
Alkaline Phosphatase: 70 U/L (ref 38–126)
Anion gap: 6 (ref 5–15)
BUN: 16 mg/dL (ref 8–23)
CO2: 30 mmol/L (ref 22–32)
Calcium: 9.3 mg/dL (ref 8.9–10.3)
Chloride: 107 mmol/L (ref 98–111)
Creatinine, Ser: 0.85 mg/dL (ref 0.44–1.00)
GFR, Estimated: >60 mL/min (ref >60)
Glucose, Bld: 116 mg/dL — ABNORMAL HIGH (ref 70–99)
Potassium: 5.4 mmol/L — ABNORMAL HIGH (ref 3.5–5.1)
Sodium: 143 mmol/L (ref 135–145)
Total Bilirubin: 1 mg/dL (ref 0.3–1.2)
Total Protein: 6.8 g/dL (ref 6.5–8.1)

## 2021-04-05 LAB — CBC WITH DIFFERENTIAL/PLATELET
Abs Immature Granulocytes: 0.02 10*3/uL (ref 0.00–0.07)
Basophils Absolute: 0 10*3/uL (ref 0.0–0.1)
Basophils Relative: 1 %
Eosinophils Absolute: 0.1 10*3/uL (ref 0.0–0.5)
Eosinophils Relative: 2 %
HCT: 43.6 % (ref 36.0–46.0)
Hemoglobin: 14 g/dL (ref 12.0–15.0)
Immature Granulocytes: 0 %
Lymphocytes Relative: 32 %
Lymphs Abs: 2.2 10*3/uL (ref 0.7–4.0)
MCH: 31 pg (ref 26.0–34.0)
MCHC: 32.1 g/dL (ref 30.0–36.0)
MCV: 96.5 fL (ref 80.0–100.0)
Monocytes Absolute: 0.5 10*3/uL (ref 0.1–1.0)
Monocytes Relative: 7 %
Neutro Abs: 4.1 10*3/uL (ref 1.7–7.7)
Neutrophils Relative %: 58 %
Platelets: 279 10*3/uL (ref 150–400)
RBC: 4.52 MIL/uL (ref 3.87–5.11)
RDW: 12.6 % (ref 11.5–15.5)
WBC: 7 10*3/uL (ref 4.0–10.5)
nRBC: 0 % (ref 0.0–0.2)

## 2021-04-05 LAB — BASIC METABOLIC PANEL
Anion gap: 7 (ref 5–15)
BUN: 16 mg/dL (ref 8–23)
CO2: 29 mmol/L (ref 22–32)
Calcium: 8.9 mg/dL (ref 8.9–10.3)
Chloride: 106 mmol/L (ref 98–111)
Creatinine, Ser: 0.83 mg/dL (ref 0.44–1.00)
GFR, Estimated: >60 mL/min (ref >60)
Glucose, Bld: 126 mg/dL — ABNORMAL HIGH (ref 70–99)
Potassium: 4.2 mmol/L (ref 3.5–5.1)
Sodium: 142 mmol/L (ref 135–145)

## 2021-04-05 MED ORDER — GLYCERIN (ADULT) 2 G RE SUPP: 1.0000 | RECTAL | 0 refills | Status: DC | PRN

## 2021-04-05 MED ORDER — TRAMADOL HCL 50 MG PO TABS: 25.0000 mg | ORAL_TABLET | Freq: Four times a day (QID) | ORAL | 0 refills | Status: DC | PRN

## 2021-04-05 NOTE — ED Triage Notes (Signed)
Patient states she his having rectal bleeding. Patient had a colonoscopy and had rectal bleeding since having the colonoscopy. Patient has a rectal prolapse and patient states the prolapse is in at the moment, but comes out when she goes to the bathroom.

## 2021-04-05 NOTE — ED Provider Notes (Signed)
  Face-to-face evaluation   History: He presents for evaluation of rectal pain with stooling, associated with rectal prolapse which is ongoing.  She states that she has trouble cleaning herself after bowel movements, and that this is uncomfortable.  She states that when she gets off the commode and sits in the chair the rectal prolapse reduces on its own.  At that point she does not have pain.  She has had an evaluation by GI and been referred to surgery for evaluation.  Patient did not have that appointment yet.   Physical exam: Elderly patient who is alert and cooperative.  She is hard of hearing.  She has a nontoxic appearance.  4:10 PM-I explained to the patient that a technique to help her cleanse herself after bowel movements, with rectal prolapse is to use a sitz bath to soak her perineum and use a washcloth to clean her rectal tissue, gently.  This should allow her to cleanse her self, and hopefully be more comfortable.  Medical screening examination/treatment/procedure(s) were conducted as a shared visit with non-physician practitioner(s) and myself.  I personally evaluated the patient during the encounter    Daleen Bo, MD 04/05/21 2315

## 2021-04-05 NOTE — Telephone Encounter (Signed)
Pt's daughter-in-law Mendel Ryder called requesting if we could expediate referral to [facility] for pt. She stated tht pt is still at the ED but they are not helping her much and looks like she is going to be sent home but pt is in so much pain that does not want to go home. Mendel Ryder is inquiring if pt could be evaluated by surgeon at the hospital or be seen asap at [facility]. Pls call her at [phone].

## 2021-04-05 NOTE — ED Provider Notes (Signed)
Emergency Medicine Provider Triage Evaluation Note  Peggy Howard , a 87 y.o. female  was evaluated in triage.  Pt complains of rectal bleeding and pain, seen by Mansourati. Rectal prolapse, called Mansourati's office, advised to return to the ER for possible surgical intervention. Reports transient prolapse, has severe pain with this at times. Right now reports no prolapse.   Review of Systems  Positive: Rectal pain Negative: fever  Physical Exam  BP 139/87 (BP Location: Left Arm)   Pulse 83   Temp 97.7 F (36.5 C) (Oral)   Resp 16   SpO2 96%  Gen:   Awake, no distress   Resp:  Normal effort  MSK:   Moves extremities without difficulty  Other:    Medical Decision Making  Medically screening exam initiated at 12:27 PM.  Appropriate orders placed.  [name] was informed that the remainder of the evaluation will be completed by another provider, this initial triage assessment does not replace that evaluation, and the importance of remaining in the ED until their evaluation is complete.     Tacy Learn, PA-C 04/05/21 1230    Valarie Merino, MD 04/06/21 2317

## 2021-04-05 NOTE — Telephone Encounter (Signed)
Referral has been made to [facility]-pt daughter advised

## 2021-04-05 NOTE — Discharge Instructions (Signed)
Your blood counts today were normal.  Your initial potassium was a little bit elevated but when we rechecked it it was normal.  I would recommend that you get your potassium rechecked by your doctor next week.  I further recommend using the glycerin suppositories and tramadol for your rectal discomfort as well as using sitz bath's at home.  The tramadol may make you sleepy so make sure that you do not drive, operate machinery or drink alcohol while taking this.  I recommend that you contact your gastroenterologist and I also gave you information for [facility] whom you should follow-up with in regards to your rectal prolapse.  They are already aware of your case and should be expecting a call from you.  If you have any new or worsening symptoms in the meantime please return to the emergency department immediately.

## 2021-04-05 NOTE — Telephone Encounter (Signed)
The pt's daughter advised that the referral was made and records sent to [facility] this morning.  She was given the number to [facility] to call and check on the status.

## 2021-04-05 NOTE — Telephone Encounter (Signed)
Surgical referral to Dr. Jaynee Eagles. Thomas/Dr. Dema Severin. Thanks. GM

## 2021-04-05 NOTE — ED Provider Notes (Signed)
Peggy Howard   CSN: [id number] Arrival date & time: 04/05/21  1044     History Chief Complaint  Patient presents with  . rectal prolapse  . Rectal Bleeding    Peggy Howard Sens is a 87 y.o. female.  HPI   87 year old female presenting to the emergency department today for evaluation of rectal prolapse.  Patient was admitted to the hospital earlier this month for rectal bleeding, at that time she had a colonoscopy which showed a small focus of mucosal prolapse with potential for solitary rectal ulcer syndrome.  Since then she has had masslike protrusion from the anus and has also had some intermittent bleeding due to inflammation in the area.  She states that the bleeding is very minimal and her main concern is that she has a lot of pain with this.  She states that the pain improves when the rectum is not prolapsed but when it comes out it is very painful.  Currently it is not prolapsed it only does this when she has a bowel movement.  She called Dr. Donneta Romberg office who has referred her to general surgery and referred her back to the ED for further evaluation.  Past Medical History:  Diagnosis Date  . Rectal prolapse     Patient Active Problem List   Diagnosis Date Noted  . GIB (gastrointestinal bleeding) 03/26/2021  . BRBPR (bright red blood per rectum) 03/25/2021  . Abdominal pain 03/25/2021  . Low back pain 03/25/2021  . Overweight (BMI 25.0-29.9) 07/16/2017  . Colonoscopy refused 07/16/2017  . Refuses treatment 07/16/2017  . Parent refuses immunizations 07/16/2017  . Patient refuses to take medication 07/16/2017  . History of cigarette smoking 06/26/2017  . Other fatigue 06/26/2017  . Memory changes 06/26/2017  . Community acquired pneumonia of right middle lobe of lung   . External hemorrhoids 01/26/2008    Past Surgical History:  Procedure Laterality Date  . ABDOMINAL HYSTERECTOMY    . BIOPSY  03/27/2021    Procedure: BIOPSY;  Surgeon: Irving Copas., MD;  Location: [facility];  Service: Gastroenterology;;  . COLONOSCOPY WITH PROPOFOL N/A 03/27/2021   Procedure: COLONOSCOPY WITH PROPOFOL;  Surgeon: Irving Copas., MD;  Location: [facility];  Service: Gastroenterology;  Laterality: N/A;  . JOINT REPLACEMENT    . POLYPECTOMY  03/27/2021   Procedure: POLYPECTOMY;  Surgeon: Mansouraty, Telford Nab., MD;  Location: [facility];  Service: Gastroenterology;;  . REPLACEMENT TOTAL KNEE Bilateral      OB History   No obstetric history on file.     History reviewed. No pertinent family history.  Social History   Tobacco Use  . Smoking status: Never Smoker  . Smokeless tobacco: Never Used  Vaping Use  . Vaping Use: Never used  Substance Use Topics  . Alcohol use: No  . Drug use: No    Home Medications Prior to Admission medications   Medication Sig Start Date End Date Taking? Authorizing Provider  acetaminophen (TYLENOL) 500 MG tablet Take 1,000 mg by mouth every 6 (six) hours as needed for mild pain, fever or headache.   Yes [provider]  glycerin adult 2 g suppository Place 1 suppository rectally as needed for constipation. 04/05/21  Yes Peggy Geers Howard, Peggy Howard  Multiple Vitamin (MULTIVITAMIN WITH MINERALS) TABS tablet Take 1 tablet by mouth daily.   Yes [provider]  Multiple Vitamins-Minerals (PRESERVISION AREDS 2 PO) Take 1 tablet by mouth 2 (two) times daily.   Yes [provider]  psyllium (METAMUCIL SMOOTH TEXTURE) 58.6 % powder Take 1 packet by mouth 3 (three) times daily. 04/03/21  Yes Pattricia Boss, MD  traMADol (ULTRAM) 50 MG tablet Take 0.5 tablets (25 mg total) by mouth every 6 (six) hours as needed. 04/05/21  Yes Earlee Herald Howard, Peggy Howard  bisacodyl (DULCOLAX) 5 MG EC tablet Take 1 tablet (5 mg total) by mouth 2 (two) times daily. Patient not taking: Reported on 04/05/2021 04/03/21   Pattricia Boss, MD  mesalamine (CANASA) 1000 MG  suppository Place 1 suppository (1,000 mg total) rectally at bedtime. Patient not taking: Reported on 04/05/2021 03/28/21   Little Ishikawa, MD    Allergies    Vitamin d analogs  Review of Systems   Review of Systems  Constitutional: Negative for chills and fever.  HENT: Negative for ear pain and sore throat.   Eyes: Negative for pain and visual disturbance.  Respiratory: Negative for cough and shortness of breath.   Cardiovascular: Negative for chest pain and palpitations.  Gastrointestinal: Positive for abdominal pain, anal bleeding and rectal pain. Negative for diarrhea, nausea and vomiting.  Genitourinary: Negative for dysuria and hematuria.  Musculoskeletal: Negative for back pain.  Skin: Negative for color change and rash.  Neurological: Negative for seizures and syncope.  All other systems reviewed and are negative.   Physical Exam Updated Vital Signs BP (!) 166/87   Pulse 71   Temp 97.7 F (36.5 C) (Oral)   Resp 18   Ht 5\' 2"  (1.575 m)   Wt 81.6 kg   SpO2 92%   BMI 32.90 kg/m   Physical Exam Vitals and nursing Howard reviewed.  Constitutional:      General: She is not in acute distress.    Appearance: She is well-developed.  HENT:     Head: Normocephalic and atraumatic.  Eyes:     Conjunctiva/sclera: Conjunctivae normal.  Cardiovascular:     Rate and Rhythm: Normal rate and regular rhythm.     Heart sounds: Normal heart sounds. No murmur heard.   Pulmonary:     Effort: Pulmonary effort is normal. No respiratory distress.     Breath sounds: Normal breath sounds. No wheezing, rhonchi or rales.  Abdominal:     General: Bowel sounds are normal.     Palpations: Abdomen is soft.     Tenderness: There is no abdominal tenderness. There is no guarding or rebound.  Genitourinary:    Comments: Chaperone present.  There is no evidence of prolapse from the rectum and no obvious bleeding noted. Musculoskeletal:     Cervical back: Neck supple.  Skin:    General:  Skin is warm and dry.  Neurological:     Mental Status: She is alert.     ED Results / Procedures / Treatments   Labs (all labs ordered are listed, but only abnormal results are displayed) Labs Reviewed  COMPREHENSIVE METABOLIC PANEL - Abnormal; Notable for the following components:      Result Value   Potassium 5.4 (*)    Glucose, Bld 116 (*)    All other components within normal limits  BASIC METABOLIC PANEL - Abnormal; Notable for the following components:   Glucose, Bld 126 (*)    All other components within normal limits  CBC WITH DIFFERENTIAL/PLATELET    EKG None  Radiology No results found.  Procedures Procedures   Medications Ordered in ED Medications - No data to display  ED Course  I have reviewed the triage vital signs and  the nursing notes.  Pertinent labs & imaging results that were available during my care of the patient were reviewed by me and considered in my medical decision making (see chart for details).    MDM Rules/Calculators/A&P                          87 year old female presenting to the emergency department today for evaluation of rectal pain.  Has a diagnosis of rectal prolapse and did have an ulceration noted after having colonoscopy earlier this month.  She presents today after she was told to come to the ED by GI when she called them about persistent pain.  Her main complaint to me is that she has significant pain when it prolapses after using the bathroom.  She states she does have some minimal bleeding when she has a bowel movement but she reported that she is not concerned about this.  She reports that she was not aware that Dr. Rush Landmark had recommended tramadol and states she has not taken this.  She does not report further complaints.  CBC was obtained and shows no evidence of anemia.  Her CMP shows a mildly elevated potassium but is otherwise unremarkable and she has normal renal function.  BMP was repeated and potassium  Was 4.2 so I  suspect the first reading was fictitious. I will advise that she get this rechecked with her pcp.   2:55 PM Discussed case with PA with Chester Holstein with colorectal surgery.  She states that GI would like to have the patient be consulted on by general surgery as there is no definitive intervention that GI can offer the patient.   3:23 PM discussed case with Claiborne Billings from general surgery who also discussed the case with Dr. Barry Dienes.  She states that as long as the patient is not actively bleeding and she does not have a persistent prolapse that is unable to be reduced and there is no indication for emergent surgery at this time.  She recommends that the patient take the tramadol that was recommended by Dr. Rush Landmark and also recommends that she can use glycerin suppositories.  I discussed the plan with the patient who expresses understanding.  She request that I call a family member so I called Ms. Deihl to give her an update.  She further requests that I call the patient'Howard son to give him an update.  In speaking with the patient'Howard son he expressed frustration that the patient was not being admitted for surgery.  I discussed that the patient'Howard laboratory work is currently stable and that her hemoglobin has actually increased over the last week so she has no evidence of significant blood loss and she does not appear to have any active bleeding on my exam today.  She is not currently prolapsed at this time and is not in any significant pain and declined pain medication here in the emergency department.  I discussed reasons for emergent surgery and that the patient does not meet this criteria per the surgery team but that they have taken down her information and will help to expedite follow-up with her on an outpatient basis.  Patient'Howard son continued to express frustration and voiced that he would reach out to administration.  Final Clinical Impression(Howard) / ED Diagnoses Final diagnoses:  Rectal prolapse    Rx / DC  Orders ED Discharge Orders         Ordered    glycerin adult 2 g suppository  As needed        04/05/21 1800    traMADol (ULTRAM) 50 MG tablet  Every 6 hours PRN        04/05/21 1800           Ranita Stjulien Howard, Peggy Howard 04/05/21 1817    Daleen Bo, MD 04/05/21 2315

## 2021-04-15 ENCOUNTER — Ambulatory Visit: Payer: Self-pay | Admitting: General Surgery

## 2021-04-15 NOTE — H&P (Signed)
The patient is a 87 year old female who presents with rectal prolapse. 87 year old female who presents to the office today for evaluation of chronic rectal prolapse. This is been worsening over the past few months. She is having rectal bleeding. She underwent a colonoscopy which shows a mid rectal ulcer concerning for chronic inflammation and prolapse. Biopsies were obtained. These showed hemorrhagic erosions consistent with mucosal prolapse or solitary rectal ulcer. She reports prolapse with standing or straining. This reduces spontaneously. Patient has a history of open hysterectomy and appendectomy.   Past Surgical History Mammie Lorenzo, LPN; 0/16/0109 32:35 AM) Appendectomy Cesarean Section - Multiple Hysterectomy (not due to cancer) - Complete Knee Surgery Bilateral. Tonsillectomy  Diagnostic Studies History Mammie Lorenzo, LPN; 5/73/2202 54:27 AM) Colonoscopy within last year Mammogram never Pap Smear never  Allergies Mammie Lorenzo, LPN; 0/62/3762 83:15 AM) No Known Drug Allergies [04/15/2021]: Allergies Reconciled  Medication History Mammie Lorenzo, LPN; 1/76/1607 37:10 AM) Metamucil (28.3% Powder, Oral) Active. Tylenol Extra Strength (500MG  Tablet, Oral) Active. One-A-Day Essential (Oral) Active. Vitamin E (450MG  Capsule, Oral) Active. Medications Reconciled  Social History Mammie Lorenzo, LPN; 05/19/9484 46:27 AM) Alcohol use Occasional alcohol use. Caffeine use Coffee. No drug use Tobacco use Former smoker.  Family History Mammie Lorenzo, LPN; 0/35/0093 81:82 AM) Depression Son. Diabetes Mellitus Son. Hypertension Son.  Pregnancy / Birth History Mammie Lorenzo, LPN; 9/93/7169 67:89 AM) Age at menarche 16 years. Age of menopause 73-50 Gravida 5 Irregular periods Maternal age 53-25 Para 71  Other Problems Mammie Lorenzo, LPN; 3/81/0175 10:25 AM) Arthritis Gastric Ulcer Gastroesophageal Reflux  Disease Melanoma     Review of Systems Mammie Lorenzo LPN; 8/52/7782 42:35 AM) General Present- Appetite Loss. Not Present- Chills, Fatigue, Fever, Night Sweats, Weight Gain and Weight Loss. Skin Present- Rash. Not Present- Change in Wart/Mole, Dryness, Hives, Jaundice, New Lesions, Non-Healing Wounds and Ulcer. HEENT Present- Hearing Loss and Ringing in the Ears. Not Present- Earache, Hoarseness, Nose Bleed, Oral Ulcers, Seasonal Allergies, Sinus Pain, Sore Throat, Visual Disturbances, Wears glasses/contact lenses and Yellow Eyes. Respiratory Not Present- Bloody sputum, Chronic Cough, Difficulty Breathing, Snoring and Wheezing. Breast Not Present- Breast Mass, Breast Pain, Nipple Discharge and Skin Changes. Cardiovascular Not Present- Chest Pain, Difficulty Breathing Lying Down, Leg Cramps, Palpitations, Rapid Heart Rate, Shortness of Breath and Swelling of Extremities. Gastrointestinal Present- Abdominal Pain, Bloody Stool, Change in Bowel Habits and Rectal Pain. Not Present- Bloating, Chronic diarrhea, Constipation, Difficulty Swallowing, Excessive gas, Gets full quickly at meals, Hemorrhoids, Indigestion, Nausea and Vomiting. Female Genitourinary Present- Pelvic Pain. Not Present- Frequency, Nocturia, Painful Urination and Urgency. Musculoskeletal Present- Back Pain. Not Present- Joint Pain, Joint Stiffness, Muscle Pain, Muscle Weakness and Swelling of Extremities. Neurological Not Present- Decreased Memory, Fainting, Headaches, Numbness, Seizures, Tingling, Tremor, Trouble walking and Weakness. Psychiatric Not Present- Anxiety, Bipolar, Change in Sleep Pattern, Depression, Fearful and Frequent crying. Endocrine Present- Hot flashes. Not Present- Cold Intolerance, Excessive Hunger, Hair Changes, Heat Intolerance and New Diabetes. Hematology Present- Easy Bruising. Not Present- Blood Thinners, Excessive bleeding, Gland problems, HIV and Persistent Infections.  Vitals Claiborne Billings Dockery LPN;  3/61/4431 54:00 AM) 04/15/2021 10:49 AM Weight: 162.6 lb Height: 65in Body Surface Area: 1.81 m Body Mass Index: 27.06 kg/m  Pulse: 87 (Regular)  BP: 142/80(Sitting, Left Arm, Standard)        Physical Exam Leighton Ruff MD; 8/67/6195 10:59 AM)  General Mental Status-Alert. General Appearance-Cooperative.  Abdomen Palpation/Percussion Palpation and Percussion of the abdomen reveal - Soft and Non Tender.  Rectal Anorectal Exam External -  rectal prolapse(complete). Internal - decreased sphincter tone.    Assessment & Plan Leighton Ruff MD; 02/10/2333 11:10 AM)  COMPLETE RECTAL PROLAPSE (K62.3) Impression: 87 year old female who presents today for evaluation of chronic rectal prolapse. She has underwent a colonoscopy which shows no sign of malignancy. On exam today, she has approximally 3-4 cm of complete rectal prolapse. I have recommended a robotic-assisted rectopexy. Given that she has no symptoms of constipation, I do not think a sigmoid resection would be prudent. We have discussed the risk of bleeding, urinary retention and temporary and permanent fecal incontinence after surgery. All questions were answered. We discussed the typical postoperative time in the hospital as well as healing time home. The surgery and anatomy were described to the patient as well as the risks of surgery and the possible complications. These include: Bleeding, deep abdominal infections and possible wound complications such as hernia and infection, damage to adjacent structures, leak of surgical connections, which can lead to other surgeries and possibly an ostomy, possible need for other procedures, such as abscess drains in radiology, possible prolonged hospital stay, possible diarrhea from removal of part of the colon, possible constipation from narcotics, possible bowel, bladder or sexual dysfunction if having rectal surgery, prolonged fatigue/weakness or appetite loss, possible early  recurrence of of disease, possible complications of their medical problems such as heart disease or arrhythmias or lung problems, death (less than 1%). I believe the patient understands and wishes to proceed with the surgery.

## 2021-05-30 NOTE — Progress Notes (Signed)
DUE TO COVID-19 ONLY ONE VISITOR IS ALLOWED TO COME WITH YOU AND STAY IN THE WAITING ROOM ONLY DURING PRE OP AND PROCEDURE DAY OF SURGERY. THE 1 VISITOR  MAY VISIT WITH YOU AFTER SURGERY IN YOUR PRIVATE ROOM DURING VISITING HOURS ONLY!  YOU NEED TO HAVE A COVID 19 TEST ON___7/18/2022____ @_______ , THIS TEST MUST BE DONE BEFORE SURGERY,  COVID TESTING SITE [address]  [id number], IT IS ON THE RIGHT GOING OUT [address] APPROXIMATELY  2 MINUTES PAST [employer] ON THE RIGHT. ONCE YOUR COVID TEST IS COMPLETED,  PLEASE BEGIN THE QUARANTINE INSTRUCTIONS AS OUTLINED IN YOUR HANDOUT.                Peggy Howard [place]  05/30/2021   Your procedure is scheduled on:                  06/12/2021   Report to [facility] Main  Entrance   Report to admitting at     [phone]     Call this number if you have problems the morning of surgery [phone]           REMEMBER: FOLLOW ALL YOUR BOWEL PREP INSTRUCTIONS WITH CLEAR LIQUIDS FROM YOUR SURGEON'S INSTRUCTIONS. DRINK 2 PRESURGERY ENSURE DRINKS THE NIGHT BEFORE SURGERY AT 1000 PM AND 1 PRESURGERY DRINK THE DAY OF THE PROCEDURE 3 HOURS PRIOR TO SCHEDULED SURGERY.  NOTHING BY MOUTH EXCEPT CLEAR LIQUIDS UNTIL THREE HOURS PRIOR TO SCHEDULED SURGERY. PLEASE FINISH PRESURGERY 3RD  ENSURE  DRINK PER SURGEON ORDER 3 HOURS PRIOR TO SCHEDULED SURGERY TIME WHICH NEEDS TO BE COMPLETED AT ___  0530am ______.     CLEAR LIQUID DIET   Foods Allowed                                                                      Coffee and tea, regular and decaf                           Plain Jell-O any favor except red or purple                                         Fruit ices (not with fruit pulp)                                      Iced Popsicles                                     Carbonated beverages, regular and diet                                    Cranberry, grape and apple juices Sports drinks like Gatorade Lightly seasoned  clear broth or consume(fat free) Sugar, honey syrup  _____________________________________________________________________      BRUSH YOUR TEETH MORNING OF SURGERY  AND RINSE YOUR MOUTH OUT, NO CHEWING GUM CANDY OR MINTS.     Take these medicines the morning of surgery with A SIP OF WATER:  none   DO NOT TAKE ANY DIABETIC MEDICATIONS DAY OF YOUR SURGERY                               You may not have any metal on your body including hair pins and              piercings  Do not wear jewelry, make-up, lotions, powders or perfumes, deodorant             Do not wear nail polish on your fingernails.  Do not shave  48 hours prior to surgery.              Men may shave face and neck.   Do not bring valuables to the hospital. [place].  Contacts, dentures or bridgework may not be worn into surgery.  Leave suitcase in the car. After surgery it may be brought to your room.                 Please read over the following fact sheets you were given: _____________________________________________________________________  [facility] - Preparing for Surgery Before surgery, you can play an important role.  Because skin is not sterile, your skin needs to be as free of germs as possible.  You can reduce the number of germs on your skin by washing with CHG (chlorahexidine gluconate) soap before surgery.  CHG is an antiseptic cleaner which kills germs and bonds with the skin to continue killing germs even after washing. Please DO NOT use if you have an allergy to CHG or antibacterial soaps.  If your skin becomes reddened/irritated stop using the CHG and inform your nurse when you arrive at Short Stay. Do not shave (including legs and underarms) for at least 48 hours prior to the first CHG shower.  You may shave your face/neck. Please follow these instructions carefully:  1.  Shower with CHG Soap the night before surgery and the  morning of Surgery.  2.  If  you choose to wash your hair, wash your hair first as usual with your  normal  shampoo.  3.  After you shampoo, rinse your hair and body thoroughly to remove the  shampoo.                           4.  Use CHG as you would any other liquid soap.  You can apply chg directly  to the skin and wash                       Gently with a scrungie or clean washcloth.  5.  Apply the CHG Soap to your body ONLY FROM THE NECK DOWN.   Do not use on face/ open                           Wound or open sores. Avoid contact with eyes, ears mouth and genitals (private parts).                       Wash face,  Genitals (private parts) with your normal soap.             6.  Wash thoroughly, paying special attention to the area where your surgery  will be performed.  7.  Thoroughly rinse your body with warm water from the neck down.  8.  DO NOT shower/wash with your normal soap after using and rinsing off  the CHG Soap.                9.  Pat yourself dry with a clean towel.            10.  Wear clean pajamas.            11.  Place clean sheets on your bed the night of your first shower and do not  sleep with pets. Day of Surgery : Do not apply any lotions/deodorants the morning of surgery.  Please wear clean clothes to the hospital/surgery center.  FAILURE TO FOLLOW THESE INSTRUCTIONS MAY RESULT IN THE CANCELLATION OF YOUR SURGERY PATIENT SIGNATURE_________________________________  NURSE SIGNATURE__________________________________  ________________________________________________________________________

## 2021-06-04 ENCOUNTER — Other Ambulatory Visit: Payer: Self-pay

## 2021-06-04 ENCOUNTER — Encounter (HOSPITAL_COMMUNITY): Payer: Self-pay

## 2021-06-04 ENCOUNTER — Encounter (HOSPITAL_COMMUNITY)
Admission: RE | Admit: 2021-06-04 | Discharge: 2021-06-04 | Disposition: A | Discharge status: Home / Self Care | Payer: Medicare Other | Source: Ambulatory Visit | Attending: General Surgery | Admitting: General Surgery

## 2021-06-04 DIAGNOSIS — Z01812 Encounter for preprocedural laboratory examination: Secondary | ICD-10-CM | POA: Insufficient documentation

## 2021-06-04 HISTORY — DX: Unspecified osteoarthritis, unspecified site: M19.90

## 2021-06-04 LAB — BASIC METABOLIC PANEL
Anion gap: 8 (ref 5–15)
BUN: 24 mg/dL — ABNORMAL HIGH (ref 8–23)
CO2: 26 mmol/L (ref 22–32)
Calcium: 8.9 mg/dL (ref 8.9–10.3)
Chloride: 107 mmol/L (ref 98–111)
Creatinine, Ser: 0.86 mg/dL (ref 0.44–1.00)
GFR, Estimated: >60 mL/min (ref >60)
Glucose, Bld: 116 mg/dL — ABNORMAL HIGH (ref 70–99)
Potassium: 4.1 mmol/L (ref 3.5–5.1)
Sodium: 141 mmol/L (ref 135–145)

## 2021-06-04 LAB — CBC
HCT: 41.7 % (ref 36.0–46.0)
Hemoglobin: 13.3 g/dL (ref 12.0–15.0)
MCH: 30.7 pg (ref 26.0–34.0)
MCHC: 31.9 g/dL (ref 30.0–36.0)
MCV: 96.3 fL (ref 80.0–100.0)
Platelets: 288 10*3/uL (ref 150–400)
RBC: 4.33 MIL/uL (ref 3.87–5.11)
RDW: 13.4 % (ref 11.5–15.5)
WBC: 7.1 10*3/uL (ref 4.0–10.5)
nRBC: 0 % (ref 0.0–0.2)

## 2021-06-04 NOTE — Progress Notes (Addendum)
Anesthesia Review:  PCP: NO PCP per pt  Cardiologist : Chest x-ray : EKG : 03/25/21  Echo : Stress test: Cardiac Cath :  Activity level: cannot do a flgiht of stairs without difficulty  Sleep Study/ CPAP : Fasting Blood Sugar :      / Checks Blood Sugar -- times a day:   Blood Thinner/ Instructions /Last Dose: ASA / Instructions/ Last Dose :

## 2021-06-10 ENCOUNTER — Other Ambulatory Visit (HOSPITAL_COMMUNITY)
Admission: RE | Admit: 2021-06-10 | Discharge: 2021-06-10 | Disposition: A | Discharge status: Home / Self Care | Payer: Medicare Other | Source: Ambulatory Visit | Attending: General Surgery | Admitting: General Surgery

## 2021-06-10 DIAGNOSIS — Z20822 Contact with and (suspected) exposure to covid-19: Secondary | ICD-10-CM | POA: Insufficient documentation

## 2021-06-10 DIAGNOSIS — Z01812 Encounter for preprocedural laboratory examination: Secondary | ICD-10-CM | POA: Insufficient documentation

## 2021-06-10 LAB — SARS CORONAVIRUS 2 (TAT 6-24 HRS): SARS Coronavirus 2: NEGATIVE

## 2021-06-11 MED ORDER — BUPIVACAINE LIPOSOME 1.3 % IJ SUSP
20.0000 mL | Freq: Once | INTRAMUSCULAR | Status: DC
  Filled 2021-06-11: qty 20

## 2021-06-11 MED ORDER — SODIUM CHLORIDE 0.9 % IV SOLN
2.0000 g | INTRAVENOUS | Status: AC
  Administered 2021-06-12: 2 g via INTRAVENOUS
  Filled 2021-06-11: qty 2

## 2021-06-11 NOTE — Anesthesia Preprocedure Evaluation (Addendum)
Anesthesia Evaluation  Patient identified by MRN, date of birth, ID band Patient awake    Reviewed: Allergy & Precautions, NPO status , Patient's Chart, lab work & pertinent test results  Airway Mallampati: II  TM Distance: >3 FB     Dental   Pulmonary pneumonia,    breath sounds clear to auscultation       Cardiovascular negative cardio ROS   Rhythm:Regular Rate:Normal     Neuro/Psych    GI/Hepatic Neg liver ROS,   Endo/Other  negative endocrine ROS  Renal/GU negative Renal ROS     Musculoskeletal  (+) Arthritis ,   Abdominal   Peds  Hematology   Anesthesia Other Findings   Reproductive/Obstetrics                            Anesthesia Physical Anesthesia Plan  ASA: 3  Anesthesia Plan: General   Post-op Pain Management:    Induction: Intravenous  PONV Risk Score and Plan: Ondansetron, Dexamethasone and Midazolam  Airway Management Planned: Oral ETT  Additional Equipment:   Intra-op Plan:   Post-operative Plan: Extubation in OR  Informed Consent: I have reviewed the patients History and Physical, chart, labs and discussed the procedure including the risks, benefits and alternatives for the proposed anesthesia with the patient or authorized representative who has indicated his/her understanding and acceptance.     Dental advisory given  Plan Discussed with: Anesthesiologist  Anesthesia Plan Comments:        Anesthesia Quick Evaluation

## 2021-06-12 ENCOUNTER — Inpatient Hospital Stay (HOSPITAL_COMMUNITY): Payer: Medicare Other | Admitting: Anesthesiology

## 2021-06-12 ENCOUNTER — Encounter (HOSPITAL_COMMUNITY): Payer: Self-pay | Admitting: General Surgery

## 2021-06-12 ENCOUNTER — Other Ambulatory Visit: Payer: Self-pay

## 2021-06-12 ENCOUNTER — Inpatient Hospital Stay (HOSPITAL_COMMUNITY)
Admission: RE | Admit: 2021-06-12 | Discharge: 2021-06-17 | DRG: 329 | Disposition: A | Discharge status: Home / Self Care | Payer: Medicare Other | Attending: General Surgery | Admitting: General Surgery

## 2021-06-12 ENCOUNTER — Encounter (HOSPITAL_COMMUNITY)
Admission: RE | Disposition: A | Discharge status: Home / Self Care | Payer: Self-pay | Source: Home / Self Care | Attending: General Surgery

## 2021-06-12 DIAGNOSIS — Z888 Allergy status to other drugs, medicaments and biological substances status: Secondary | ICD-10-CM

## 2021-06-12 DIAGNOSIS — J9811 Atelectasis: Secondary | ICD-10-CM | POA: Diagnosis present

## 2021-06-12 DIAGNOSIS — K623 Rectal prolapse: Principal | ICD-10-CM | POA: Diagnosis present

## 2021-06-12 DIAGNOSIS — E669 Obesity, unspecified: Secondary | ICD-10-CM | POA: Diagnosis present

## 2021-06-12 DIAGNOSIS — J9601 Acute respiratory failure with hypoxia: Secondary | ICD-10-CM | POA: Diagnosis present

## 2021-06-12 DIAGNOSIS — Z96653 Presence of artificial knee joint, bilateral: Secondary | ICD-10-CM | POA: Diagnosis present

## 2021-06-12 DIAGNOSIS — Z20822 Contact with and (suspected) exposure to covid-19: Secondary | ICD-10-CM | POA: Diagnosis present

## 2021-06-12 DIAGNOSIS — H9190 Unspecified hearing loss, unspecified ear: Secondary | ICD-10-CM | POA: Diagnosis present

## 2021-06-12 DIAGNOSIS — Q438 Other specified congenital malformations of intestine: Secondary | ICD-10-CM

## 2021-06-12 DIAGNOSIS — Z87891 Personal history of nicotine dependence: Secondary | ICD-10-CM

## 2021-06-12 DIAGNOSIS — Z85820 Personal history of malignant melanoma of skin: Secondary | ICD-10-CM

## 2021-06-12 DIAGNOSIS — Z6830 Body mass index (BMI) 30.0-30.9, adult: Secondary | ICD-10-CM

## 2021-06-12 DIAGNOSIS — R918 Other nonspecific abnormal finding of lung field: Secondary | ICD-10-CM | POA: Diagnosis present

## 2021-06-12 DIAGNOSIS — J90 Pleural effusion, not elsewhere classified: Secondary | ICD-10-CM | POA: Diagnosis present

## 2021-06-12 DIAGNOSIS — Z89022 Acquired absence of left finger(s): Secondary | ICD-10-CM | POA: Diagnosis not present

## 2021-06-12 DIAGNOSIS — M7989 Other specified soft tissue disorders: Secondary | ICD-10-CM | POA: Diagnosis not present

## 2021-06-12 DIAGNOSIS — R0602 Shortness of breath: Secondary | ICD-10-CM

## 2021-06-12 DIAGNOSIS — R0603 Acute respiratory distress: Secondary | ICD-10-CM | POA: Diagnosis not present

## 2021-06-12 DIAGNOSIS — Z9889 Other specified postprocedural states: Secondary | ICD-10-CM

## 2021-06-12 HISTORY — PX: XI ROBOT ASSISTED RECTOPEXY: SHX6788

## 2021-06-12 SURGERY — RECTOPEXY, ROBOT-ASSISTED
Anesthesia: General | Site: Abdomen

## 2021-06-12 MED ORDER — SODIUM CHLORIDE 0.9 % IV SOLN
2.0000 g | Freq: Two times a day (BID) | INTRAVENOUS | Status: AC
  Administered 2021-06-12: 2 g via INTRAVENOUS
  Filled 2021-06-12: qty 2

## 2021-06-12 MED ORDER — ONDANSETRON HCL 4 MG/2ML IJ SOLN
INTRAMUSCULAR | Status: DC | PRN
  Administered 2021-06-12 (×2): 4 mg via INTRAVENOUS

## 2021-06-12 MED ORDER — SACCHAROMYCES BOULARDII 250 MG PO CAPS
250.0000 mg | ORAL_CAPSULE | Freq: Two times a day (BID) | ORAL | Status: DC
  Administered 2021-06-12 – 2021-06-17 (×10): 250 mg via ORAL
  Filled 2021-06-12 (×10): qty 1

## 2021-06-12 MED ORDER — CHLORHEXIDINE GLUCONATE 0.12 % MT SOLN
15.0000 mL | Freq: Once | OROMUCOSAL | Status: AC
  Administered 2021-06-12: 15 mL via OROMUCOSAL

## 2021-06-12 MED ORDER — ALVIMOPAN 12 MG PO CAPS
12.0000 mg | ORAL_CAPSULE | Freq: Two times a day (BID) | ORAL | Status: DC
  Administered 2021-06-13: 12 mg via ORAL
  Filled 2021-06-12: qty 1

## 2021-06-12 MED ORDER — BUPIVACAINE-EPINEPHRINE 0.25% -1:200000 IJ SOLN
INTRAMUSCULAR | Status: DC | PRN
  Administered 2021-06-12: 30 mL

## 2021-06-12 MED ORDER — SIMETHICONE 80 MG PO CHEW: 40.0000 mg | CHEWABLE_TABLET | Freq: Four times a day (QID) | ORAL | Status: DC | PRN

## 2021-06-12 MED ORDER — DEXAMETHASONE SODIUM PHOSPHATE 10 MG/ML IJ SOLN
INTRAMUSCULAR | Status: AC
  Filled 2021-06-12: qty 1

## 2021-06-12 MED ORDER — ONDANSETRON HCL 4 MG/2ML IJ SOLN: 4.0000 mg | Freq: Four times a day (QID) | INTRAMUSCULAR | Status: DC | PRN

## 2021-06-12 MED ORDER — FENTANYL CITRATE (PF) 100 MCG/2ML IJ SOLN: 25.0000 ug | INTRAMUSCULAR | Status: DC | PRN

## 2021-06-12 MED ORDER — ENSURE SURGERY PO LIQD
237.0000 mL | Freq: Two times a day (BID) | ORAL | Status: DC
  Administered 2021-06-12 – 2021-06-16 (×7): 237 mL via ORAL

## 2021-06-12 MED ORDER — DEXAMETHASONE SODIUM PHOSPHATE 10 MG/ML IJ SOLN
INTRAMUSCULAR | Status: DC | PRN
Start: 2021-06-12 — End: 2021-06-12
  Administered 2021-06-12: 10 mg via INTRAVENOUS

## 2021-06-12 MED ORDER — ALBUTEROL SULFATE HFA 108 (90 BASE) MCG/ACT IN AERS
INHALATION_SPRAY | RESPIRATORY_TRACT | Status: DC | PRN
  Administered 2021-06-12: 2 via RESPIRATORY_TRACT

## 2021-06-12 MED ORDER — BISACODYL 5 MG PO TBEC
5.0000 mg | DELAYED_RELEASE_TABLET | Freq: Two times a day (BID) | ORAL | Status: DC
  Administered 2021-06-12 – 2021-06-14 (×5): 5 mg via ORAL
  Filled 2021-06-12 (×7): qty 1

## 2021-06-12 MED ORDER — ROCURONIUM BROMIDE 10 MG/ML (PF) SYRINGE
PREFILLED_SYRINGE | INTRAVENOUS | Status: AC
  Filled 2021-06-12: qty 10

## 2021-06-12 MED ORDER — HYDROMORPHONE HCL 1 MG/ML IJ SOLN: 0.5000 mg | INTRAMUSCULAR | Status: DC | PRN

## 2021-06-12 MED ORDER — ENSURE PRE-SURGERY PO LIQD
296.0000 mL | Freq: Once | ORAL | Status: DC
  Filled 2021-06-12: qty 296

## 2021-06-12 MED ORDER — PROPOFOL 10 MG/ML IV BOLUS
INTRAVENOUS | Status: AC
  Filled 2021-06-12: qty 20

## 2021-06-12 MED ORDER — LACTATED RINGERS IV SOLN: INTRAVENOUS | Status: DC

## 2021-06-12 MED ORDER — ONDANSETRON HCL 4 MG PO TABS: 4.0000 mg | ORAL_TABLET | Freq: Four times a day (QID) | ORAL | Status: DC | PRN

## 2021-06-12 MED ORDER — ENOXAPARIN SODIUM 40 MG/0.4ML IJ SOSY
40.0000 mg | PREFILLED_SYRINGE | INTRAMUSCULAR | Status: DC
  Administered 2021-06-13 – 2021-06-17 (×5): 40 mg via SUBCUTANEOUS
  Filled 2021-06-12 (×5): qty 0.4

## 2021-06-12 MED ORDER — BUPIVACAINE LIPOSOME 1.3 % IJ SUSP
INTRAMUSCULAR | Status: DC | PRN
  Administered 2021-06-12: 20 mL

## 2021-06-12 MED ORDER — LACTATED RINGERS IV SOLN: INTRAVENOUS | Status: DC | PRN

## 2021-06-12 MED ORDER — BUPIVACAINE-EPINEPHRINE (PF) 0.25% -1:200000 IJ SOLN
INTRAMUSCULAR | Status: AC
  Filled 2021-06-12: qty 30

## 2021-06-12 MED ORDER — ACETAMINOPHEN 500 MG PO TABS
1000.0000 mg | ORAL_TABLET | Freq: Four times a day (QID) | ORAL | Status: DC | PRN
  Administered 2021-06-13 – 2021-06-17 (×8): 1000 mg via ORAL
  Filled 2021-06-12 (×8): qty 2

## 2021-06-12 MED ORDER — TRAMADOL HCL 50 MG PO TABS: 25.0000 mg | ORAL_TABLET | Freq: Four times a day (QID) | ORAL | Status: DC | PRN

## 2021-06-12 MED ORDER — LIDOCAINE HCL 2 % IJ SOLN
INTRAMUSCULAR | Status: AC
  Filled 2021-06-12: qty 20

## 2021-06-12 MED ORDER — LIDOCAINE HCL (PF) 2 % IJ SOLN
INTRAMUSCULAR | Status: DC | PRN
  Administered 2021-06-12: 1.5 mg/kg/h via INTRADERMAL

## 2021-06-12 MED ORDER — ALUM & MAG HYDROXIDE-SIMETH 200-200-20 MG/5ML PO SUSP: 30.0000 mL | Freq: Four times a day (QID) | ORAL | Status: DC | PRN

## 2021-06-12 MED ORDER — LIDOCAINE HCL (CARDIAC) PF 100 MG/5ML IV SOSY
PREFILLED_SYRINGE | INTRAVENOUS | Status: DC | PRN
  Administered 2021-06-12: 50 mg via INTRAVENOUS
  Administered 2021-06-12: 100 mg via INTRAVENOUS

## 2021-06-12 MED ORDER — 0.9 % SODIUM CHLORIDE (POUR BTL) OPTIME
TOPICAL | Status: DC | PRN
  Administered 2021-06-12: 1000 mL

## 2021-06-12 MED ORDER — GABAPENTIN 300 MG PO CAPS
300.0000 mg | ORAL_CAPSULE | ORAL | Status: AC
  Administered 2021-06-12: 300 mg via ORAL
  Filled 2021-06-12: qty 1

## 2021-06-12 MED ORDER — ALVIMOPAN 12 MG PO CAPS
12.0000 mg | ORAL_CAPSULE | ORAL | Status: AC
  Administered 2021-06-12: 12 mg via ORAL
  Filled 2021-06-12: qty 1

## 2021-06-12 MED ORDER — PROPOFOL 10 MG/ML IV BOLUS
INTRAVENOUS | Status: DC | PRN
  Administered 2021-06-12: 140 mg via INTRAVENOUS
  Administered 2021-06-12: 30 mg via INTRAVENOUS

## 2021-06-12 MED ORDER — FENTANYL CITRATE (PF) 250 MCG/5ML IJ SOLN
INTRAMUSCULAR | Status: AC
  Filled 2021-06-12: qty 5

## 2021-06-12 MED ORDER — ROCURONIUM BROMIDE 100 MG/10ML IV SOLN
INTRAVENOUS | Status: DC | PRN
Start: 2021-06-12 — End: 2021-06-12
  Administered 2021-06-12: 50 mg via INTRAVENOUS
  Administered 2021-06-12: 20 mg via INTRAVENOUS
  Administered 2021-06-12: 50 mg via INTRAVENOUS

## 2021-06-12 MED ORDER — ACETAMINOPHEN 500 MG PO TABS
1000.0000 mg | ORAL_TABLET | ORAL | Status: AC
  Administered 2021-06-12: 1000 mg via ORAL
  Filled 2021-06-12: qty 2

## 2021-06-12 MED ORDER — ALBUMIN HUMAN 5 % IV SOLN: INTRAVENOUS | Status: DC | PRN

## 2021-06-12 MED ORDER — LIDOCAINE 2% (20 MG/ML) 5 ML SYRINGE
INTRAMUSCULAR | Status: AC
  Filled 2021-06-12: qty 5

## 2021-06-12 MED ORDER — FENTANYL CITRATE (PF) 100 MCG/2ML IJ SOLN
INTRAMUSCULAR | Status: DC | PRN
  Administered 2021-06-12: 50 ug via INTRAVENOUS
  Administered 2021-06-12: 25 ug via INTRAVENOUS
  Administered 2021-06-12: 50 ug via INTRAVENOUS
  Administered 2021-06-12: 25 ug via INTRAVENOUS

## 2021-06-12 MED ORDER — HEPARIN SODIUM (PORCINE) 5000 UNIT/ML IJ SOLN
5000.0000 [IU] | Freq: Once | INTRAMUSCULAR | Status: AC
  Administered 2021-06-12: 5000 [IU] via SUBCUTANEOUS
  Filled 2021-06-12: qty 1

## 2021-06-12 MED ORDER — ALBUTEROL SULFATE HFA 108 (90 BASE) MCG/ACT IN AERS
INHALATION_SPRAY | RESPIRATORY_TRACT | Status: AC
  Filled 2021-06-12: qty 6.7

## 2021-06-12 MED ORDER — SUGAMMADEX SODIUM 200 MG/2ML IV SOLN
INTRAVENOUS | Status: DC | PRN
  Administered 2021-06-12: 200 mg via INTRAVENOUS

## 2021-06-12 MED ORDER — ENSURE PRE-SURGERY PO LIQD
592.0000 mL | Freq: Once | ORAL | Status: DC
  Filled 2021-06-12: qty 592

## 2021-06-12 MED ORDER — KCL IN DEXTROSE-NACL 20-5-0.45 MEQ/L-%-% IV SOLN
INTRAVENOUS | Status: DC
  Filled 2021-06-12: qty 1000

## 2021-06-12 MED ORDER — ONDANSETRON HCL 4 MG/2ML IJ SOLN
INTRAMUSCULAR | Status: AC
  Filled 2021-06-12: qty 2

## 2021-06-12 MED ORDER — LACTATED RINGERS IR SOLN
Status: DC | PRN
  Administered 2021-06-12: 1000 mL

## 2021-06-12 MED ORDER — PSYLLIUM 95 % PO PACK
1.0000 | PACK | Freq: Two times a day (BID) | ORAL | Status: DC
Start: 2021-06-12 — End: 2021-06-17
  Administered 2021-06-12 – 2021-06-17 (×10): 1 via ORAL
  Filled 2021-06-12 (×10): qty 1

## 2021-06-12 MED ORDER — ORAL CARE MOUTH RINSE: 15.0000 mL | Freq: Once | OROMUCOSAL | Status: AC

## 2021-06-12 SURGICAL SUPPLY — 59 items
ADH SKN CLS APL DERMABOND .7 (GAUZE/BANDAGES/DRESSINGS) ×1
BAG COUNTER SPONGE SURGICOUNT (BAG) ×2 IMPLANT
BAG SPNG CNTER NS LX DISP (BAG) ×1
BLADE SURG 15 STRL LF DISP TIS (BLADE) ×1 IMPLANT
BLADE SURG 15 STRL SS (BLADE) ×2
COVER MAYO STAND STRL (DRAPES) ×2 IMPLANT
COVER SURGICAL LIGHT HANDLE (MISCELLANEOUS) ×2 IMPLANT
COVER TIP SHEARS 8 DVNC (MISCELLANEOUS) ×1 IMPLANT
COVER TIP SHEARS 8MM DA VINCI (MISCELLANEOUS) ×2
DECANTER SPIKE VIAL GLASS SM (MISCELLANEOUS) IMPLANT
DERMABOND ADVANCED (GAUZE/BANDAGES/DRESSINGS) ×1
DERMABOND ADVANCED .7 DNX12 (GAUZE/BANDAGES/DRESSINGS) ×1 IMPLANT
DRAIN CHANNEL 19F RND (DRAIN) ×1 IMPLANT
DRAPE ARM DVNC X/XI (DISPOSABLE) ×4 IMPLANT
DRAPE COLUMN DVNC XI (DISPOSABLE) ×1 IMPLANT
DRAPE DA VINCI XI ARM (DISPOSABLE) ×8
DRAPE DA VINCI XI COLUMN (DISPOSABLE) ×2
DRAPE SURG IRRIG POUCH 19X23 (DRAPES) ×2 IMPLANT
ELECT PENCIL ROCKER SW 15FT (MISCELLANEOUS) ×1 IMPLANT
ELECT REM PT RETURN 15FT ADLT (MISCELLANEOUS) ×2 IMPLANT
EVACUATOR SILICONE 100CC (DRAIN) ×1 IMPLANT
GLOVE SURG ENC MOIS LTX SZ6.5 (GLOVE) ×4 IMPLANT
GLOVE SURG UNDER POLY LF SZ7 (GLOVE) ×4 IMPLANT
GOWN STRL REUS W/TWL XL LVL3 (GOWN DISPOSABLE) ×5 IMPLANT
HOLDER FOLEY CATH W/STRAP (MISCELLANEOUS) ×2 IMPLANT
IRRIG SUCT STRYKERFLOW 2 WTIP (MISCELLANEOUS) ×2
IRRIGATION SUCT STRKRFLW 2 WTP (MISCELLANEOUS) ×1 IMPLANT
KIT BASIN OR (CUSTOM PROCEDURE TRAY) ×2 IMPLANT
LEGGING LITHOTOMY PAIR STRL (DRAPES) ×2 IMPLANT
LUBRICANT JELLY K Y 4OZ (MISCELLANEOUS) ×2 IMPLANT
NEEDLE HYPO 22GX1.5 SAFETY (NEEDLE) ×2 IMPLANT
NEEDLE INSUFFLATION 14GA 120MM (NEEDLE) ×2 IMPLANT
PACK CARDIOVASCULAR III (CUSTOM PROCEDURE TRAY) ×2 IMPLANT
PAD POSITIONING PINK XL (MISCELLANEOUS) ×2 IMPLANT
PROTECTOR NERVE ULNAR (MISCELLANEOUS) ×3 IMPLANT
SCISSORS LAP 5X45 EPIX DISP (ENDOMECHANICALS) ×2 IMPLANT
SEAL CANN UNIV 5-8 DVNC XI (MISCELLANEOUS) ×4 IMPLANT
SEAL XI 5MM-8MM UNIVERSAL (MISCELLANEOUS) ×8
SEALER VESSEL DA VINCI XI (MISCELLANEOUS) ×2
SEALER VESSEL EXT DVNC XI (MISCELLANEOUS) IMPLANT
SOL ANTI FOG 6CC (MISCELLANEOUS) ×1 IMPLANT
SOLUTION ANTI FOG 6CC (MISCELLANEOUS) ×1
SOLUTION ELECTROLUBE (MISCELLANEOUS) ×2 IMPLANT
SPONGE T-LAP 18X18 ~~LOC~~+RFID (SPONGE) ×2 IMPLANT
SUT ETHIBOND 2 0 SH (SUTURE) ×2
SUT ETHIBOND 2 0 SH 36X2 (SUTURE) ×1 IMPLANT
SUT ETHILON 2 0 PS N (SUTURE) IMPLANT
SUT PROLENE 2 0 SH DA (SUTURE) ×1 IMPLANT
SUT VIC AB 4-0 PS2 18 (SUTURE) ×2 IMPLANT
SUT VLOC 180 2-0 6IN GS21 (SUTURE) ×4 IMPLANT
SYR 10ML ECCENTRIC (SYRINGE) ×2 IMPLANT
SYR CONTROL 10ML LL (SYRINGE) ×2 IMPLANT
TOWEL OR 17X26 10 PK STRL BLUE (TOWEL DISPOSABLE) ×2 IMPLANT
TOWEL OR NON WOVEN STRL DISP B (DISPOSABLE) ×2 IMPLANT
TRAY FOLEY MTR SLVR 14FR STAT (SET/KITS/TRAYS/PACK) ×2 IMPLANT
TRAY FOLEY MTR SLVR 16FR STAT (SET/KITS/TRAYS/PACK) ×1 IMPLANT
TROCAR ADV FIXATION 5X100MM (TROCAR) ×2 IMPLANT
TUBING CONNECTING 10 (TUBING) ×2 IMPLANT
TUBING INSUFFLATION 10FT LAP (TUBING) ×2 IMPLANT

## 2021-06-12 NOTE — Anesthesia Procedure Notes (Signed)
Procedure Name: Intubation Date/Time: 06/12/2021 8:32 AM Performed by: Jari Pigg, CRNA Pre-anesthesia Checklist: Patient identified, Emergency Drugs available, Suction available and Patient being monitored Patient Re-evaluated:Patient Re-evaluated prior to induction Oxygen Delivery Method: Circle system utilized Preoxygenation: Pre-oxygenation with 100% oxygen Induction Type: IV induction Ventilation: Mask ventilation without difficulty Laryngoscope Size: Mac and 3 Grade View: Grade I Tube type: Oral Number of attempts: 1 Airway Equipment and Method: Stylet and Oral airway Placement Confirmation: ETT inserted through vocal cords under direct vision, positive ETCO2 and breath sounds checked- equal and bilateral Secured at: 22 cm Tube secured with: Tape Dental Injury: Teeth and Oropharynx as per pre-operative assessment

## 2021-06-12 NOTE — Anesthesia Postprocedure Evaluation (Signed)
Anesthesia Post Note  Patient: Peggy Howard  Procedure(s) Performed: XI ROBOT ASSISTED RECTOPEXY (Abdomen)     Patient location during evaluation: PACU Anesthesia Type: General Level of consciousness: awake Pain management: pain level controlled Vital Signs Assessment: post-procedure vital signs reviewed and stable Respiratory status: spontaneous breathing Cardiovascular status: stable Postop Assessment: no apparent nausea or vomiting Anesthetic complications: no   No notable events documented.  Last Vitals:  Vitals:   06/12/21 1228 06/12/21 1340  BP: (!) 144/85 (!) 144/81  Pulse: 68 71  Resp: 17 18  Temp: (!) 36.3 C (!) 36.3 C  SpO2: 95% 95%    Last Pain:  Vitals:   06/12/21 1124  TempSrc: Axillary  PainSc: 5                  Dariana Garbett

## 2021-06-12 NOTE — Op Note (Signed)
06/12/2021  10:07 AM  PATIENT:  Peggy Howard  87 y.o. female  Patient Care Team: Lorrene Reid, PA-C as PCP - General  PRE-OPERATIVE DIAGNOSIS:  rectal prolapse  POST-OPERATIVE DIAGNOSIS:  rectal prolapse  PROCEDURE:  XI ROBOT ASSISTED RECTOPEXY   Surgeon(s): Leighton Ruff, MD Michael Boston, MD  ASSISTANT: Dr Johney Maine   ANESTHESIA:   local and general  EBL: 50 ml  Total I/O In: 1250 [I.V.:1000; IV Piggyback:250] Out: 200 [Urine:200]  Delay start of Pharmacological VTE agent (>24hrs) due to surgical blood loss or risk of bleeding:  no  DRAINS: (21F) Jackson-Pratt drain(s) with closed bulb suction in the pelvis    SPECIMEN:  Source of Specimen:  none  DISPOSITION OF SPECIMEN:  PATHOLOGY  COUNTS:  YES  PLAN OF CARE: Admit to inpatient   PATIENT DISPOSITION:  PACU - hemodynamically stable.  INDICATION:      Patient with rectal prolapse.  I recommended robotic rectopexy:  The anatomy & physiology of the digestive tract was discussed.  The pathophysiology was discussed.  Natural history risks without surgery was discussed.   I worked to give an overview of the disease and the frequent need to have multispecialty involvement.  I feel the risks of no intervention will lead to serious problems that outweigh the operative risks; therefore, I recommended a partial colectomy to remove the pathology.   Risks such as bleeding, infection, abscess, leak, reoperation, possible ostomy, hernia, heart attack, death, and other risks were discussed.  I noted a good likelihood this will help address the problem.   Goals of post-operative recovery were discussed as well.    The patient expressed understanding & wished to proceed with surgery.  OR FINDINGS:   Patient had redundant sigmoid colon  DESCRIPTION:   Informed consent was confirmed.  The patient underwent general anaesthesia without difficulty.  The patient was positioned appropriately.  VTE prevention in place.  The  patient's abdomen was clipped, prepped, & draped in a sterile fashion.  Surgical timeout confirmed our plan.  The patient was positioned in reverse Trendelenburg.  Abdominal entry was gained using a LUQ Varies needle.  Entry was clean.  I induced carbon dioxide insufflation.  An 41mm robotic port was placed in the RUQ.  Camera inspection revealed no injury.  Extra ports were carefully placed under direct laparoscopic visualization.  I laparoscopically reflected the greater omentum and the upper abdomen the small bowel in the upper abdomen. The patient was appropriately positioned and the robot was docked to the patient's left side.  Instruments were placed under direct visualization.  A TAPP block was placed with Experel under laparoscopic visualization.    I elevated the sigmoid colon.  It was adherent to the anterior pelvis.  I left these adhesions in place.  I scored the base of peritoneum of the right side of the mesentery of the left colon from the ligament of Treitz to the peritoneal reflection of the mid rectum.  I elevated the sigmoid mesentery and enetered into the retro-mesenteric plane. We were able to identify the ureters. We kept those posterior within the retroperitoneum and elevated the left colon mesentery off that.  I continued distally and got into the avascular plane posterior to the mesorectum. This allowed me to help mobilize the rectum as well by freeing the mesorectum off the sacrum.  I mobilized the right peritoneal covering towards the peritoneal reflection.  I mobilized the mesorectal plan down to the level of the pelvic floor posteriorly and laterally, leaving  the left lateral attachments intact.    I was then able to pull the peritoneal reflection to the level of the sacral promontory.  I assured hemostasis in the pelvis.  I used a 2-0 V lock suture to secure the peritoneal reflection to the sacral prometory and then place 2, 0 Ethibond sutures in the presacral ostium and secured  these to the peritoneal reflection tissues to create a stable rectopexy. I could see the right hypogastric nerve and kept this out of the pexy.  I then performed a DRE and there was no further prolapse.  The rectum was taught in the pelvis.  A 19 F blake drain was placed in the pelvis and brought out through the RLQ port site.  This was secured to the skin using a 2-0 Nylon suture.    I then closed the peritoneum with a running 2-0 V lock suture.  The entire pelvis was inspected.  There was no sign of bleeding or injury.  The sigmoid colon did not kink at any point due to the pexy.  At this point the robot was undocked and the ports were removed.  The port sites were closed using 4-0 Vicryl sutures and dermabond.  The patient was awakened and sent to the PACU in stable condition.  All counts were correct per OR staff.  An MD assistant was necessary for tissue manipulation, retraction and positioning due to the complexity of the case and hospital policies

## 2021-06-12 NOTE — Transfer of Care (Signed)
Immediate Anesthesia Transfer of Care Note  Patient: Peggy Howard  Procedure(s) Performed: XI ROBOT ASSISTED RECTOPEXY (Abdomen)  Patient Location: PACU  Anesthesia Type:General  Level of Consciousness: drowsy and patient cooperative  Airway & Oxygen Therapy: Patient Spontanous Breathing and Patient connected to face mask oxygen 4L O2  Post-op Assessment: Report given to RN and Post -op Vital signs reviewed and stable  Post vital signs: Reviewed and stable  Last Vitals:  Vitals Value Taken Time  BP 168/82 06/12/21 1021  Temp    Pulse 72 06/12/21 1022  Resp 19 06/12/21 1022  SpO2 97 % 06/12/21 1022  Vitals shown include unvalidated device data.  Last Pain:  Vitals:   06/12/21 0659  TempSrc: Oral  PainSc: 3       Patients Stated Pain Goal: 3 (24/46/95 0722)  Complications: No notable events documented.

## 2021-06-12 NOTE — H&P (Signed)
The patient is a 87 year old female who presents with rectal prolapse. 87 year old female who presents to the office today for evaluation of chronic rectal prolapse.  This is been worsening over the past few months.  She is having rectal bleeding.  She underwent a colonoscopy which shows a mid rectal ulcer concerning for chronic inflammation and prolapse.  Biopsies were obtained.  These showed hemorrhagic erosions consistent with mucosal prolapse or solitary rectal ulcer.  She reports prolapse with standing or straining.  This reduces spontaneously.  Patient has a history of open hysterectomy and appendectomy.     Past Surgical History Mammie Lorenzo, LPN; 6/57/8469 62:95 AM) Appendectomy  Cesarean Section - Multiple  Hysterectomy (not due to cancer) - Complete  Knee Surgery  Bilateral. Tonsillectomy    Diagnostic Studies History Mammie Lorenzo, LPN; 2/84/1324 40:10 AM) Colonoscopy  within last year Mammogram  never Pap Smear  never   Allergies Mammie Lorenzo, LPN; 2/72/5366 44:03 AM) No Known Drug Allergies  [04/15/2021]: Allergies Reconciled    Medication History Mammie Lorenzo, LPN; 4/74/2595 63:87 AM) Metamucil  (28.3% Powder, Oral) Active. Tylenol Extra Strength  (500MG  Tablet, Oral) Active. One-A-Day Essential  (Oral) Active. Vitamin E  (450MG  Capsule, Oral) Active. Medications Reconciled    Social History Mammie Lorenzo, LPN; 5/64/3329 51:88 AM) Alcohol use  Occasional alcohol use. Caffeine use  Coffee. No drug use  Tobacco use  Former smoker.   Family History Mammie Lorenzo, LPN; 03/09/6062 01:60 AM) Depression  Son. Diabetes Mellitus  Son. Hypertension  Son.   Pregnancy / Birth History Mammie Lorenzo, LPN; 12/02/3233 57:32 AM) Age at menarche  14 years. Age of menopause  67-50 Gravida  7 Irregular periods  Maternal age  45-25 Para  68   Other Problems Mammie Lorenzo, LPN; 12/26/5425 06:23 AM) Arthritis  Gastric Ulcer  Gastroesophageal Reflux Disease  Melanoma           Review of Systems  General Present- Appetite Loss. Not Present- Chills, Fatigue, Fever, Night Sweats, Weight Gain and Weight Loss. Skin Present- Rash. Not Present- Change in Wart/Mole, Dryness, Hives, Jaundice, New Lesions, Non-Healing Wounds and Ulcer. HEENT Present- Hearing Loss and Ringing in the Ears. Not Present- Earache, Hoarseness, Nose Bleed, Oral Ulcers, Seasonal Allergies, Sinus Pain, Sore Throat, Visual Disturbances, Wears glasses/contact lenses and Yellow Eyes. Respiratory Not Present- Bloody sputum, Chronic Cough, Difficulty Breathing, Snoring and Wheezing. Breast Not Present- Breast Mass, Breast Pain, Nipple Discharge and Skin Changes. Cardiovascular Not Present- Chest Pain, Difficulty Breathing Lying Down, Leg Cramps, Palpitations, Rapid Heart Rate, Shortness of Breath and Swelling of Extremities. Gastrointestinal Present- Abdominal Pain, Bloody Stool, Change in Bowel Habits and Rectal Pain. Not Present- Bloating, Chronic diarrhea, Constipation, Difficulty Swallowing, Excessive gas, Gets full quickly at meals, Hemorrhoids, Indigestion, Nausea and Vomiting. Female Genitourinary Present- Pelvic Pain. Not Present- Frequency, Nocturia, Painful Urination and Urgency. Musculoskeletal Present- Back Pain. Not Present- Joint Pain, Joint Stiffness, Muscle Pain, Muscle Weakness and Swelling of Extremities. Neurological Not Present- Decreased Memory, Fainting, Headaches, Numbness, Seizures, Tingling, Tremor, Trouble walking and Weakness. Psychiatric Not Present- Anxiety, Bipolar, Change in Sleep Pattern, Depression, Fearful and Frequent crying. Endocrine Present- Hot flashes. Not Present- Cold Intolerance, Excessive Hunger, Hair Changes, Heat Intolerance and New Diabetes. Hematology Present- Easy Bruising. Not Present- Blood Thinners, Excessive bleeding, Gland problems, HIV and Persistent Infections.   BP (!) 154/91   Pulse 86   Temp 98.1 F (36.7 C) (Oral)   Resp 18   Ht 5\' 4"   (1.626 m)   Wt  73.5 kg   SpO2 93%   BMI 27.81 kg/m         Physical Exam    General Mental Status - Alert. General Appearance - Cooperative.   Abdomen Palpation/Percussion Palpation and Percussion of the abdomen reveal - Soft and Non Tender.   Rectal Anorectal Exam External - rectal prolapse (complete). Internal - decreased sphincter tone.       Assessment & Plan    COMPLETE RECTAL PROLAPSE (K62.3) Impression: 87 year old female who presents today for evaluation of chronic rectal prolapse. She has underwent a colonoscopy which shows no sign of malignancy. On exam today, she has approximally 3-4 cm of complete rectal prolapse. I have recommended a robotic-assisted rectopexy. Given that she has no symptoms of constipation, I do not think a sigmoid resection would be prudent. We have discussed the risk of bleeding, urinary retention and temporary and permanent fecal incontinence after surgery. All questions were answered. We discussed the typical postoperative time in the hospital as well as healing time home. The surgery and anatomy were described to the patient as well as the risks of surgery and the possible complications. These include: Bleeding, deep abdominal infections and possible wound complications such as hernia and infection, damage to adjacent structures, leak of surgical connections, which can lead to other surgeries and possibly an ostomy, possible need for other procedures, such as abscess drains in radiology, possible prolonged hospital stay, possible diarrhea from removal of part of the colon, possible constipation from narcotics, possible bowel, bladder or sexual dysfunction if having rectal surgery, prolonged fatigue/weakness or appetite loss, possible early recurrence of of disease, possible complications of their medical problems such as heart disease or arrhythmias or lung problems, death (less than 1%). I believe the patient understands and wishes to proceed with the  surgery.

## 2021-06-13 ENCOUNTER — Encounter (HOSPITAL_COMMUNITY): Payer: Self-pay | Admitting: General Surgery

## 2021-06-13 LAB — BASIC METABOLIC PANEL
Anion gap: 8 (ref 5–15)
BUN: 13 mg/dL (ref 8–23)
CO2: 27 mmol/L (ref 22–32)
Calcium: 8.5 mg/dL — ABNORMAL LOW (ref 8.9–10.3)
Chloride: 105 mmol/L (ref 98–111)
Creatinine, Ser: 0.74 mg/dL (ref 0.44–1.00)
GFR, Estimated: >60 mL/min (ref >60)
Glucose, Bld: 109 mg/dL — ABNORMAL HIGH (ref 70–99)
Potassium: 4.3 mmol/L (ref 3.5–5.1)
Sodium: 140 mmol/L (ref 135–145)

## 2021-06-13 LAB — CBC
HCT: 37.2 % (ref 36.0–46.0)
Hemoglobin: 11.7 g/dL — ABNORMAL LOW (ref 12.0–15.0)
MCH: 31.2 pg (ref 26.0–34.0)
MCHC: 31.5 g/dL (ref 30.0–36.0)
MCV: 99.2 fL (ref 80.0–100.0)
Platelets: 277 10*3/uL (ref 150–400)
RBC: 3.75 MIL/uL — ABNORMAL LOW (ref 3.87–5.11)
RDW: 13.8 % (ref 11.5–15.5)
WBC: 8.4 10*3/uL (ref 4.0–10.5)
nRBC: 0 % (ref 0.0–0.2)

## 2021-06-13 NOTE — Progress Notes (Signed)
Patient complained of chest pain. BP was 156/74 and PR was 74 at 1236. At 1023 BP was 129/59 and PR of 82. EKG was done and showed normal sinus rhythm. O2 was 94% on room air. I started patient on 2L. She also has expiratory wheezing which she had prior to admission.

## 2021-06-13 NOTE — Progress Notes (Signed)
1 Day Post-Op robotic rectopexy Subjective: Doing well, min pain.  Tolerating liquids  Objective: Vital signs in last 24 hours: Temp:  [97.4 F (36.3 C)-98.5 F (36.9 C)] 98.4 F (36.9 C) (07/21 0556) Pulse Rate:  [63-74] 65 (07/21 0556) Resp:  [15-18] 16 (07/21 0556) BP: (127-169)/(65-87) 128/71 (07/21 0556) SpO2:  [91 %-98 %] 91 % (07/21 0556) Weight:  [80.4 kg] 80.4 kg (07/21 0500)   Intake/Output from previous day: 07/20 0701 - 07/21 0700 In: 2812.7 [P.O.:600; I.V.:1862.7; IV Piggyback:350] Out: 2620 [Urine:2450; Drains:120; Blood:50] Intake/Output this shift: No intake/output data recorded.   General appearance: alert and cooperative GI: soft, appropriately tender  Incision: no significant drainage  Lab Results:  Recent Labs    06/13/21 0421  WBC 8.4  HGB 11.7*  HCT 37.2  PLT 277   BMET Recent Labs    06/13/21 0421  NA 140  K 4.3  CL 105  CO2 27  GLUCOSE 109*  BUN 13  CREATININE 0.74  CALCIUM 8.5*   PT/INR No results for input(s): LABPROT, INR in the last 72 hours. ABG No results for input(s): PHART, HCO3 in the last 72 hours.  Invalid input(s): PCO2, PO2  MEDS, Scheduled  alvimopan  12 mg Oral BID   bisacodyl  5 mg Oral BID   enoxaparin (LOVENOX) injection  40 mg Subcutaneous Q24H   feeding supplement  237 mL Oral BID BM   psyllium  1 packet Oral BID   saccharomyces boulardii  250 mg Oral BID    Studies/Results: No results found.  Assessment: s/p Procedure(s): XI ROBOT ASSISTED RECTOPEXY Patient Active Problem List   Diagnosis Date Noted   Rectal prolapse 06/12/2021   GIB (gastrointestinal bleeding) 03/26/2021   BRBPR (bright red blood per rectum) 03/25/2021   Abdominal pain 03/25/2021   Low back pain 03/25/2021   Overweight (BMI 25.0-29.9) 07/16/2017   Colonoscopy refused 07/16/2017   Refuses treatment 07/16/2017   Parent refuses immunizations 07/16/2017   Patient refuses to take medication 07/16/2017   History of cigarette  smoking 06/26/2017   Other fatigue 06/26/2017   Memory changes 06/26/2017   Community acquired pneumonia of right middle lobe of lung    External hemorrhoids 01/26/2008    Expected post op course  Plan: Advance diet to soft foods SL IVF Walk in hallways today   LOS: 1 day     .Rosario Adie, MD [facility], [place]    06/13/2021 7:18 AM

## 2021-06-13 NOTE — Progress Notes (Signed)
Mobility Specialist - Progress Note     06/13/21 1558  Oxygen Therapy  SpO2 90 %  O2 Device Room Air  Mobility  Activity Ambulated in hall  Level of Assistance Contact guard assist, steadying assist  Assistive Device Front wheel walker  Distance Ambulated (ft) 140 ft  Mobility Ambulated with assistance in hallway  Mobility Response Tolerated well  Mobility performed by Mobility specialist   Pre-mobility: 98% SpO2 During mobility: 90%-88% SpO2 Post-mobility: 92% SPO2  Upon entry pt was on 2L of O2 due to breathing problems from earlier in the day. RN stated pt usually does not wear O2 and can ambulate without it. O2 measured and showed pt was good to ambulate. Pt ambulated ~140 ft in hallway using RW and was determined to walk, however O2 levels began to decrease and ranged between 88%-90%. Pt required 2 verbal warnings until going back to room and resting in recliner to catch her breath. O2 measured while pt was in recliner and sitting for ~1 minute. O2 levels slowly began to increase towards end of session. Pt left in recliner with call bell at side, family in room, and pt reconnected to O2. RN informed of session and O2 levels.   [place] Specialist [facility] Services Phone: [phone] 06/13/21, 4:12 PM

## 2021-06-13 NOTE — Progress Notes (Signed)
Pharmacy Brief Note - Alvimopan (Entereg)  The standing order set for alvimopan (Entereg) now includes an automatic order to discontinue the drug after the patient has had a bowel movement.  The change was approved by the [facility] and the Medical Executive Committee.    This patient has had a bowel movement documented by nursing.  Therefore, alvimopan has been discontinued.   Rn reports BM was watery, some small formed.  She requests d/c Entereg.    Thank you  Gretta Arab PharmD, BCPS Clinical Pharmacist [facility] main pharmacy [phone] 06/13/2021 8:58 PM

## 2021-06-14 ENCOUNTER — Inpatient Hospital Stay (HOSPITAL_COMMUNITY): Payer: Medicare Other

## 2021-06-14 DIAGNOSIS — M7989 Other specified soft tissue disorders: Secondary | ICD-10-CM | POA: Diagnosis not present

## 2021-06-14 DIAGNOSIS — Z87891 Personal history of nicotine dependence: Secondary | ICD-10-CM | POA: Diagnosis not present

## 2021-06-14 DIAGNOSIS — Z85820 Personal history of malignant melanoma of skin: Secondary | ICD-10-CM

## 2021-06-14 DIAGNOSIS — R918 Other nonspecific abnormal finding of lung field: Secondary | ICD-10-CM

## 2021-06-14 DIAGNOSIS — J90 Pleural effusion, not elsewhere classified: Secondary | ICD-10-CM

## 2021-06-14 LAB — ALBUMIN, PLEURAL OR PERITONEAL FLUID: Albumin, Fluid: 2.5 g/dL

## 2021-06-14 LAB — BASIC METABOLIC PANEL
Anion gap: 6 (ref 5–15)
BUN: 19 mg/dL (ref 8–23)
CO2: 27 mmol/L (ref 22–32)
Calcium: 8.5 mg/dL — ABNORMAL LOW (ref 8.9–10.3)
Chloride: 106 mmol/L (ref 98–111)
Creatinine, Ser: 0.95 mg/dL (ref 0.44–1.00)
GFR, Estimated: 59 mL/min — ABNORMAL LOW (ref >60)
Glucose, Bld: 109 mg/dL — ABNORMAL HIGH (ref 70–99)
Potassium: 4.8 mmol/L (ref 3.5–5.1)
Sodium: 139 mmol/L (ref 135–145)

## 2021-06-14 LAB — BODY FLUID CELL COUNT WITH DIFFERENTIAL
Eos, Fluid: 2 %
Lymphs, Fluid: 72 %
Monocyte-Macrophage-Serous Fluid: 18 % — ABNORMAL LOW (ref 50–90)
Neutrophil Count, Fluid: 8 % (ref 0–25)
Total Nucleated Cell Count, Fluid: 952 cu mm (ref 0–1000)

## 2021-06-14 LAB — CBC
HCT: 39.8 % (ref 36.0–46.0)
Hemoglobin: 12.3 g/dL (ref 12.0–15.0)
MCH: 30.7 pg (ref 26.0–34.0)
MCHC: 30.9 g/dL (ref 30.0–36.0)
MCV: 99.3 fL (ref 80.0–100.0)
Platelets: 290 10*3/uL (ref 150–400)
RBC: 4.01 MIL/uL (ref 3.87–5.11)
RDW: 13.7 % (ref 11.5–15.5)
WBC: 7.9 10*3/uL (ref 4.0–10.5)
nRBC: 0 % (ref 0.0–0.2)

## 2021-06-14 LAB — LACTATE DEHYDROGENASE, PLEURAL OR PERITONEAL FLUID: LD, Fluid: 340 U/L — ABNORMAL HIGH (ref 3–23)

## 2021-06-14 LAB — PROTEIN, PLEURAL OR PERITONEAL FLUID: Total protein, fluid: 4 g/dL

## 2021-06-14 LAB — GLUCOSE, PLEURAL OR PERITONEAL FLUID: Glucose, Fluid: 140 mg/dL

## 2021-06-14 LAB — BRAIN NATRIURETIC PEPTIDE: B Natriuretic Peptide: 63.2 pg/mL (ref 0.0–100.0)

## 2021-06-14 IMAGING — US US THORACENTESIS ASP PLEURAL SPACE W/IMG GUIDE
1 series · 3 of 3 positions shown · non-contrast
Comparison: none

INDICATION: Patient s/p rectopexy, with shortness of breath post-op. Found to
have large right pleural effusion. Request made for diagnostic and
therapeutic right thoracentesis.

[Series 1: us thoracentesis asp pleural s mc & wl · 3 of 3 slices shown]
[im 1/3]
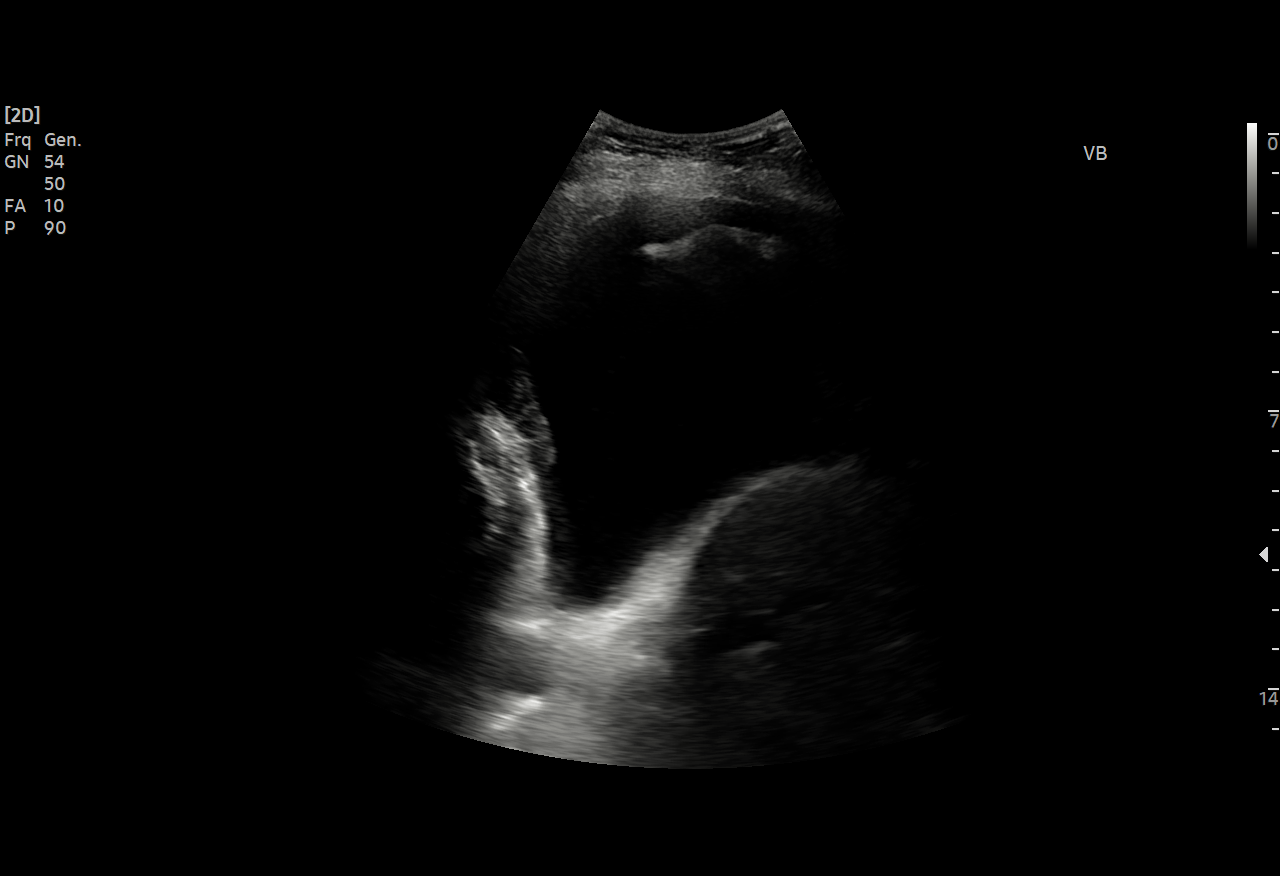
[im 2/3]
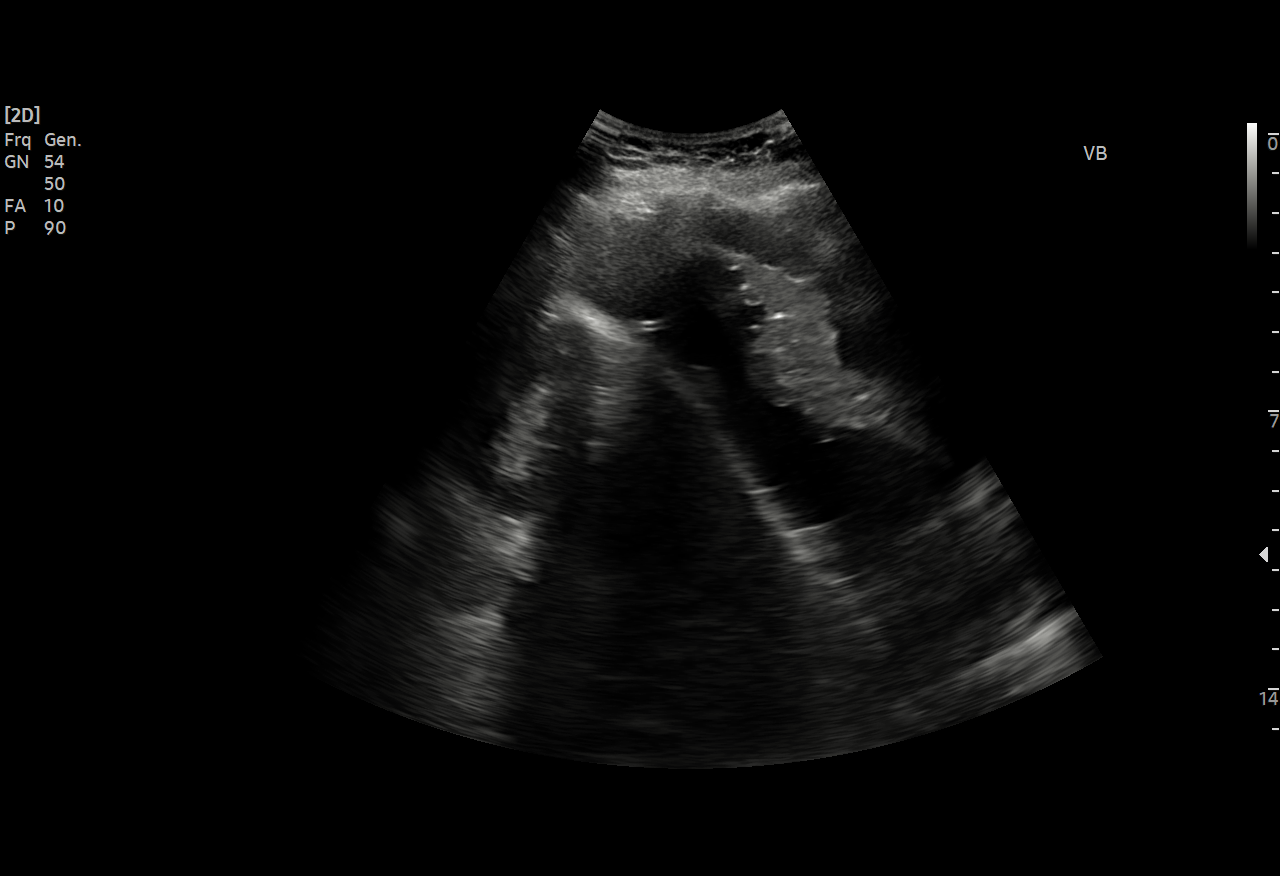
[im 3/3]
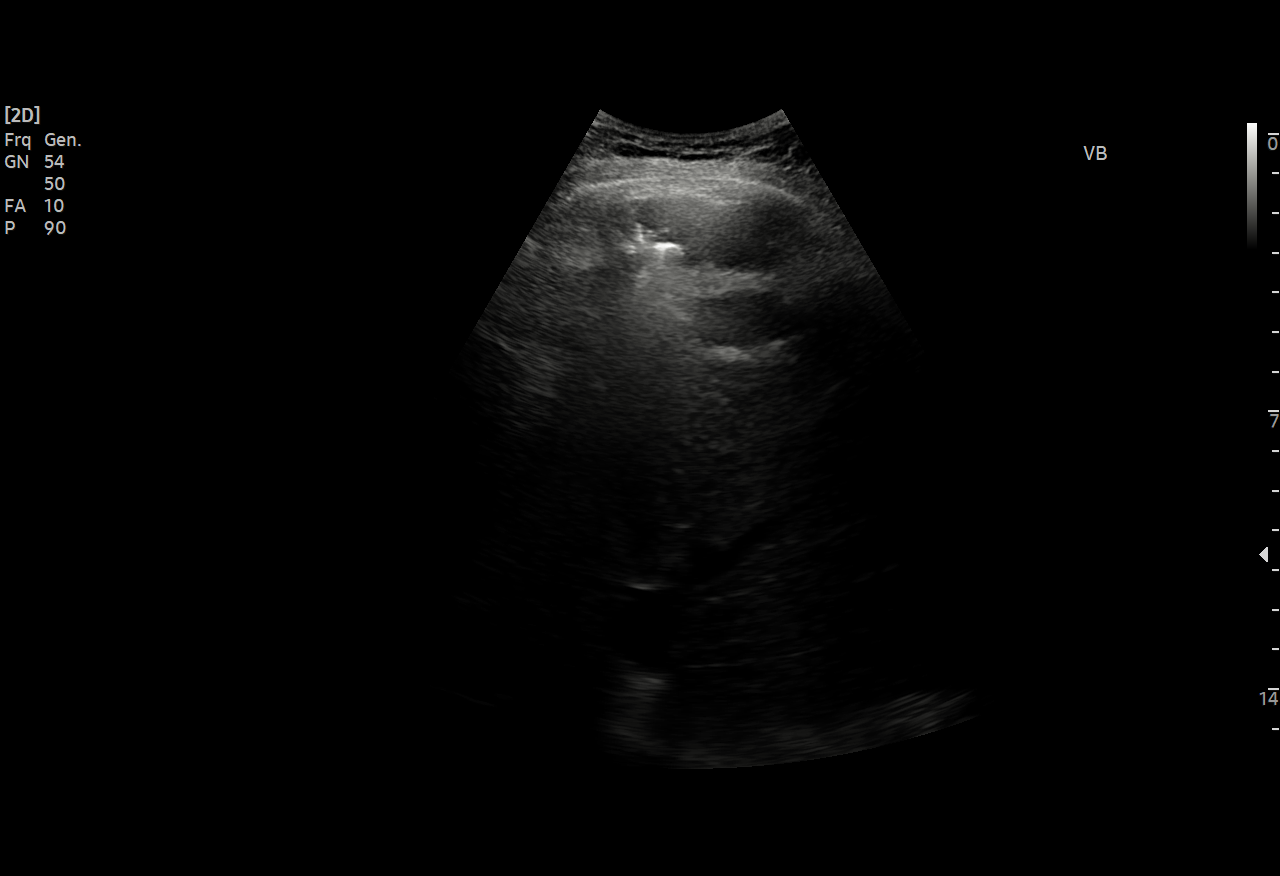

[3 of 3 positions shown; findings below may reference images not displayed]

EXAM:
ULTRASOUND GUIDED RIGHT THORACENTESIS

MEDICATIONS:
10 mL 1% lidocaine

COMPLICATIONS:
None immediate.

PROCEDURE:
An ultrasound guided thoracentesis was thoroughly discussed with the
patient and questions answered. The benefits, risks, alternatives
and complications were also discussed. The patient understands and
wishes to proceed with the procedure. Written consent was obtained.

Ultrasound was performed to localize and mark an adequate pocket of
fluid in the right chest. The area was then prepped and draped in
the normal sterile fashion. 1% Lidocaine was used for local
anesthesia. Under ultrasound guidance a 6 Fr Safe-T-Centesis
catheter was introduced. Thoracentesis was performed. The catheter
was removed and a dressing applied.
FINDINGS: A total of approximately 1.4 liters of bloody fluid was removed.
Samples were sent to the laboratory as requested by the clinical
team.
IMPRESSION: Successful ultrasound guided right thoracentesis yielding 1.4 liters
of pleural fluid.

## 2021-06-14 IMAGING — DX DG CHEST 1V PORT
1 series · 1 of 1 positions shown · non-contrast
Comparison: [DATE].

CLINICAL DATA: Shortness of breath.

EXAM:
PORTABLE CHEST 1 VIEW

[chest ap]
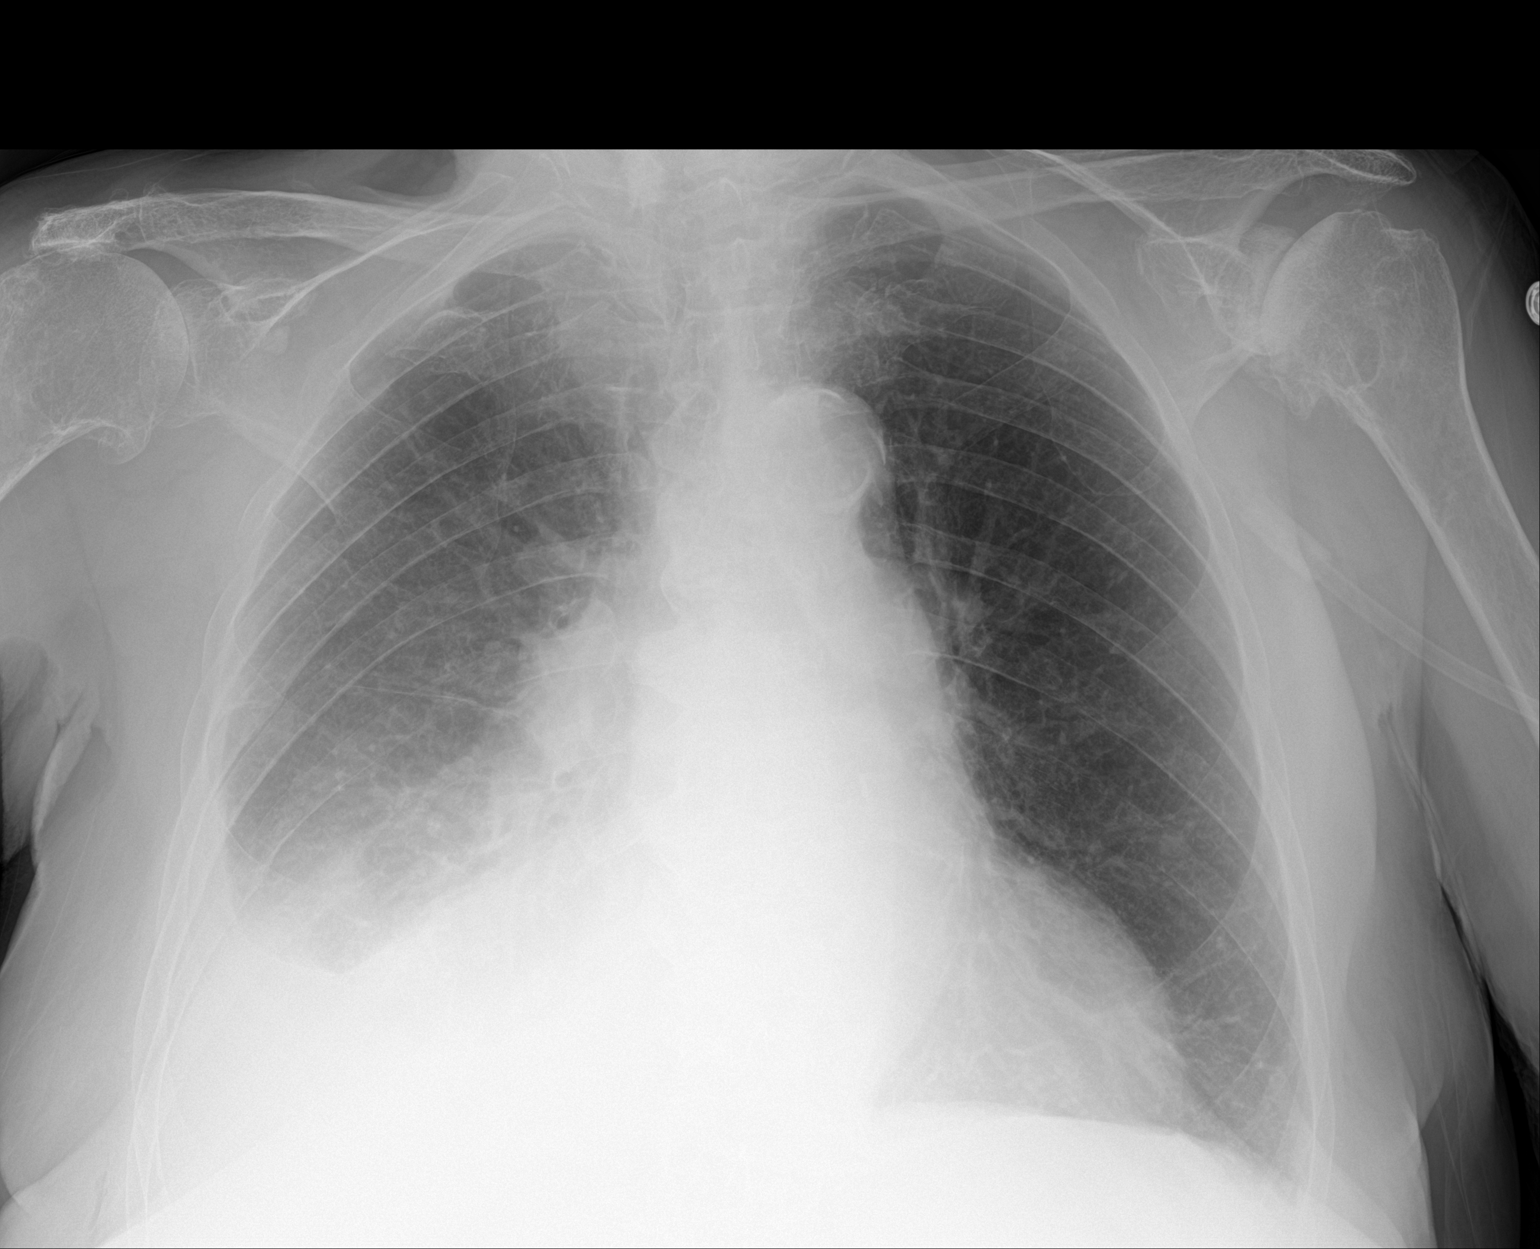

[1 of 1 positions shown; findings below may reference images not displayed]

FINDINGS: Stable cardiomediastinal silhouette. No pneumothorax is noted. Left
lung is clear. Mild right pleural effusion is noted with associated
right basilar atelectasis or infiltrate. Bony thorax is
unremarkable.
IMPRESSION: Mild right pleural effusion is noted with associated right basilar
atelectasis or infiltrate.

Aortic Atherosclerosis ([TP]-[TP]).

## 2021-06-14 IMAGING — DX DG CHEST 1V PORT
1 series · 1 of 1 positions shown · non-contrast
Comparison: Radiographs and CT earlier today

CLINICAL DATA: Status post right thoracentesis.

EXAM:
PORTABLE CHEST 1 VIEW

[chest ap]
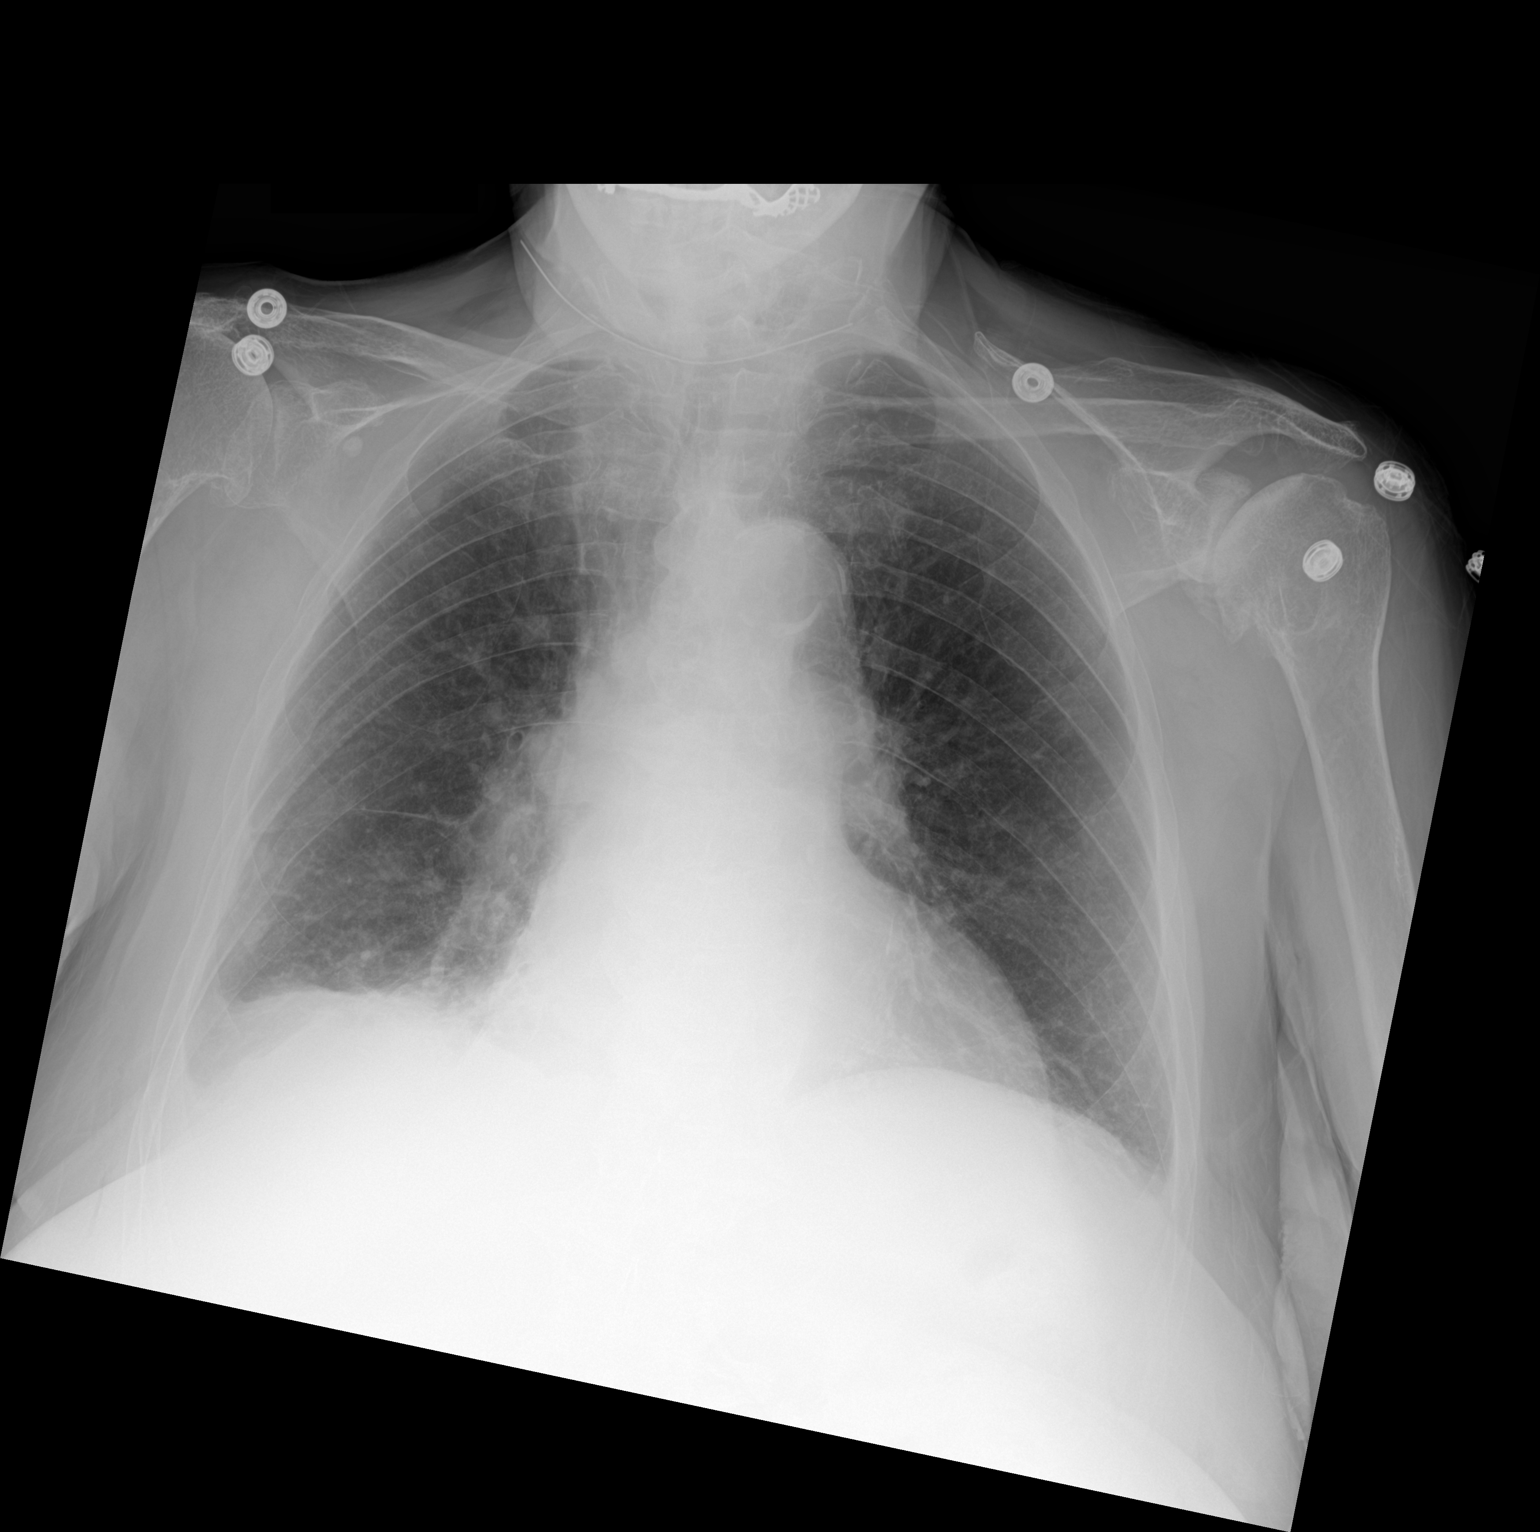

[1 of 1 positions shown; findings below may reference images not displayed]

FINDINGS: Decreased size of right pleural effusion post thoracentesis. Small
residual pleural fluid persists. No pneumothorax. Cardiomegaly is
similar. Unchanged mediastinal contours with aortic atherosclerosis.
IMPRESSION: 1. Decreased size of right pleural effusion post thoracentesis. No
pneumothorax.
2. Unchanged cardiomegaly.

## 2021-06-14 IMAGING — CT CT ANGIO CHEST
2 of 7 series · 17 of 46 positions shown · IV contrast (omnipaque)
Comparison: Chest radiograph [DATE]

CLINICAL DATA: 87-year-old with chest pain or shortness of breath,
pleurisy or effusion suspected.

EXAM:
CT ANGIOGRAPHY CHEST WITH CONTRAST
TECHNIQUE: Multidetector CT imaging of the chest was performed using the
standard protocol during bolus administration of intravenous
contrast. Multiplanar CT image reconstructions and MIPs were
obtained to evaluate the vascular anatomy.
CONTRAST:  75mL OMNIPAQUE IOHEXOL 350 MG/ML SOLN

[Series 5: thins · axial · 0.71mm/px · z∈[-278,-16]mm · 15 of 300 slices shown]
[im 19/300  lung]
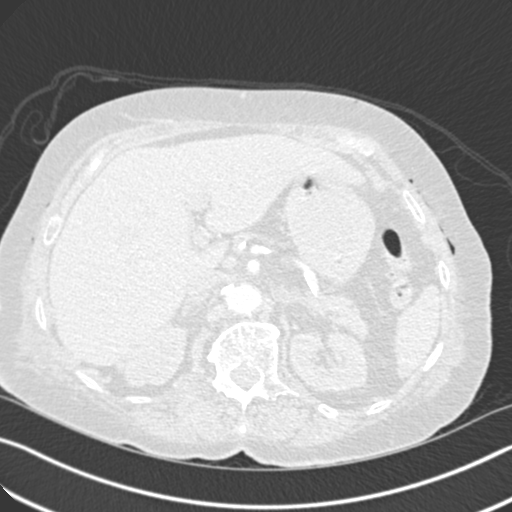
[im 38/300  soft-tissue]
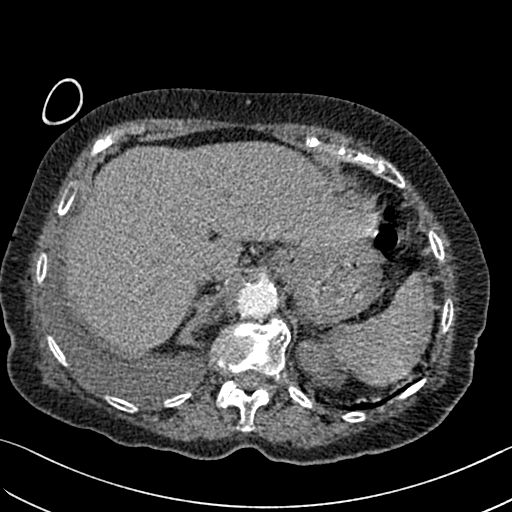
[im 57/300  lung]
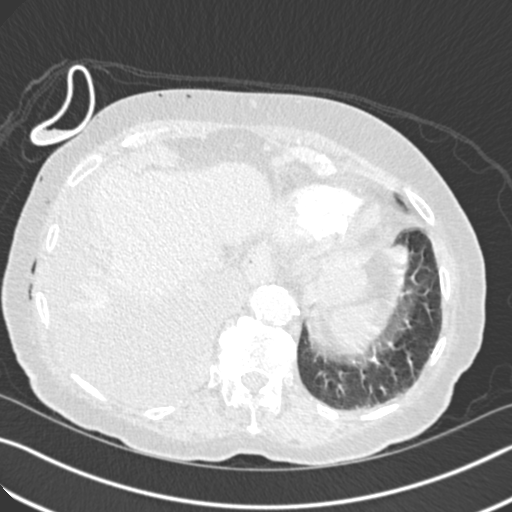
[im 75/300  soft-tissue]
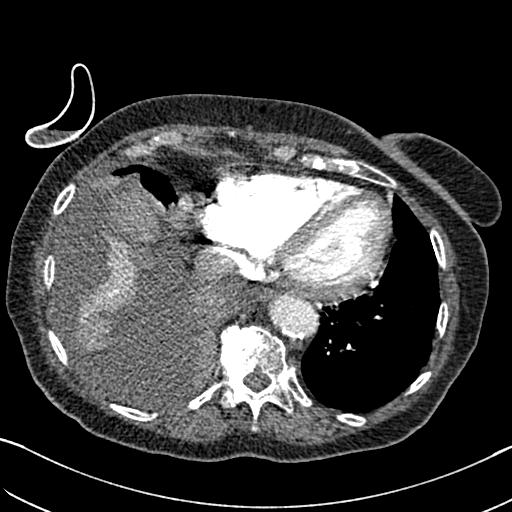
[im 94/300  lung]
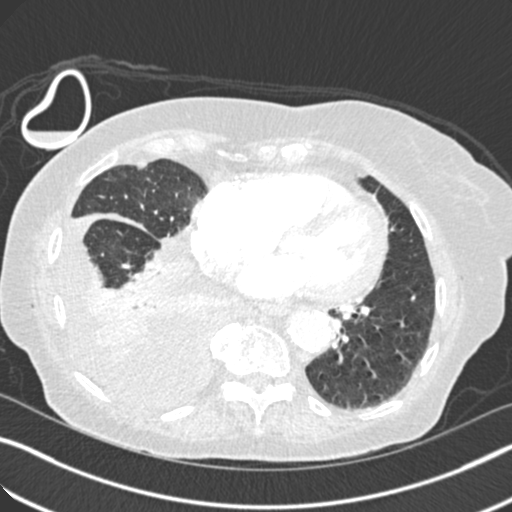
[im 113/300  soft-tissue]
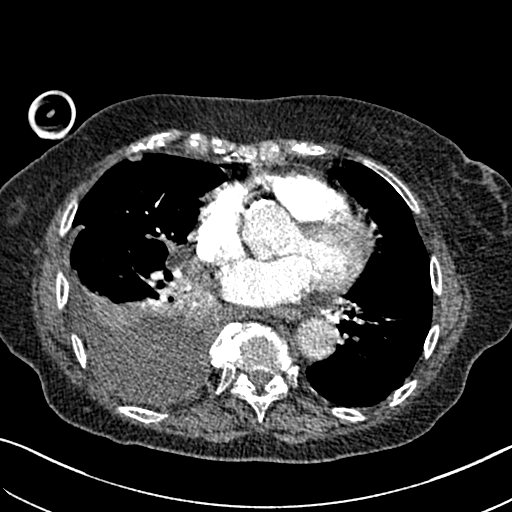
[im 131/300  lung]
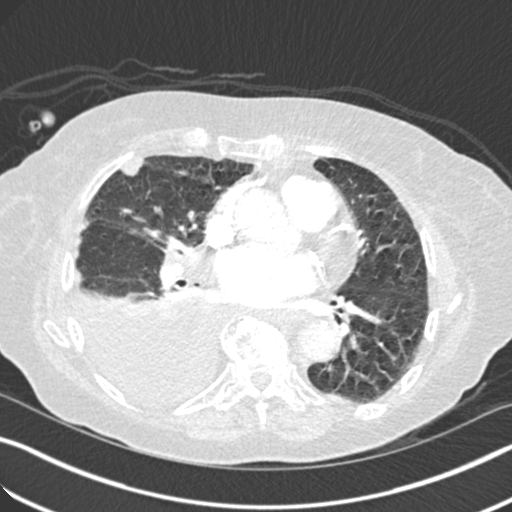
[im 150/300  soft-tissue]
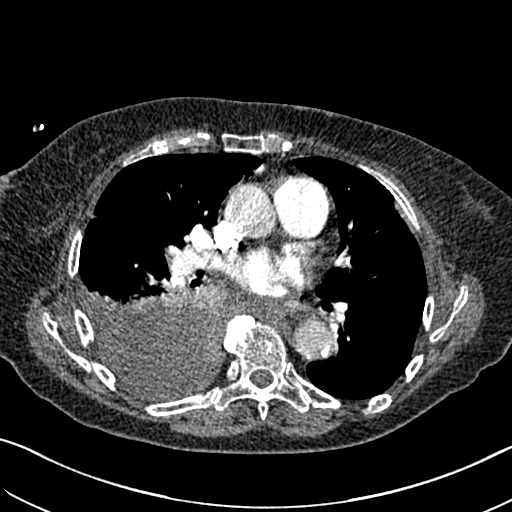
[im 169/300  lung]
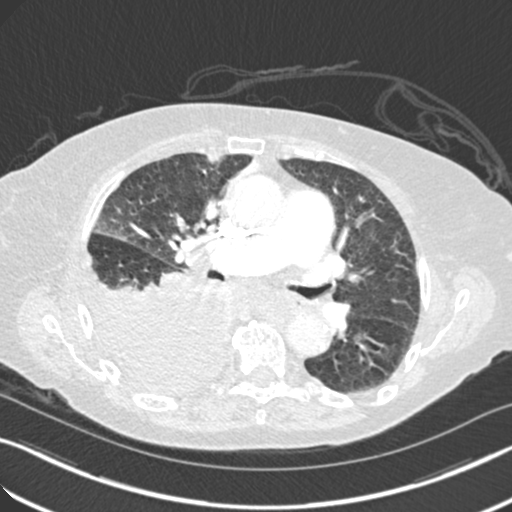
[im 187/300  soft-tissue]
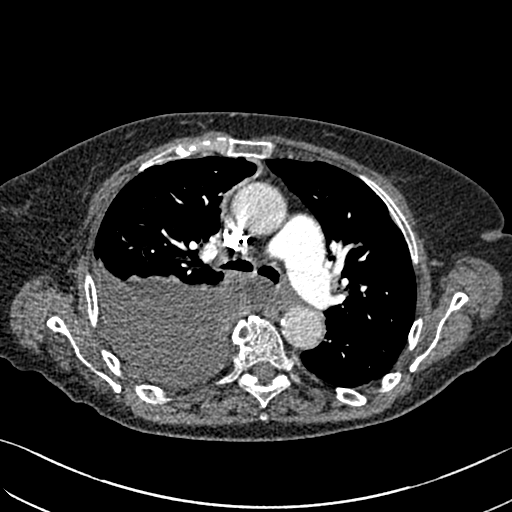
[im 206/300  lung]
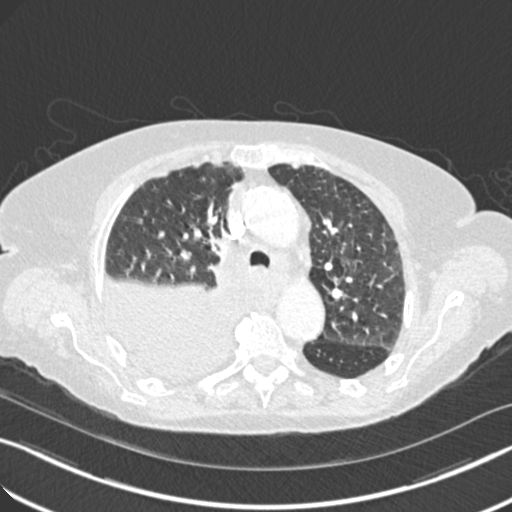
[im 225/300  soft-tissue]
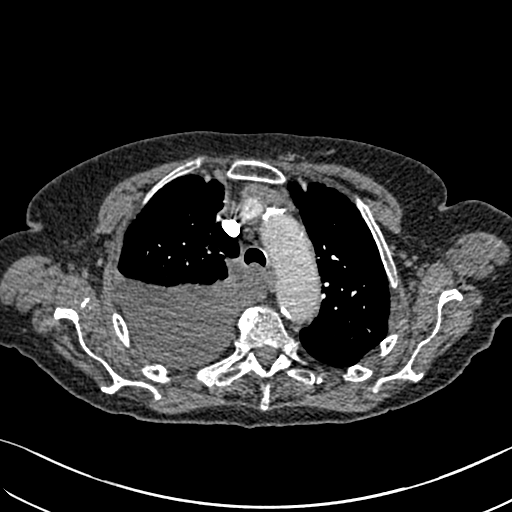
[im 243/300  lung]
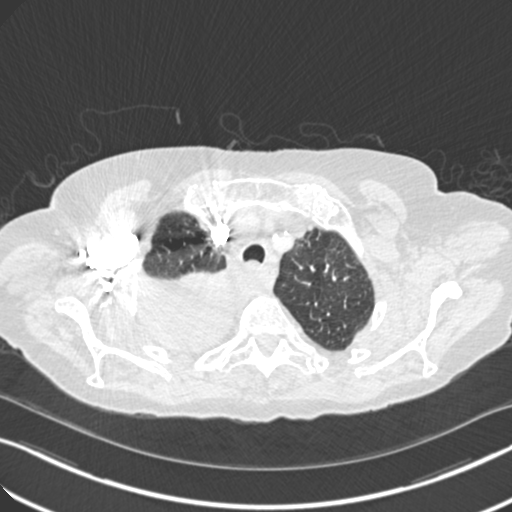
[im 262/300  soft-tissue]
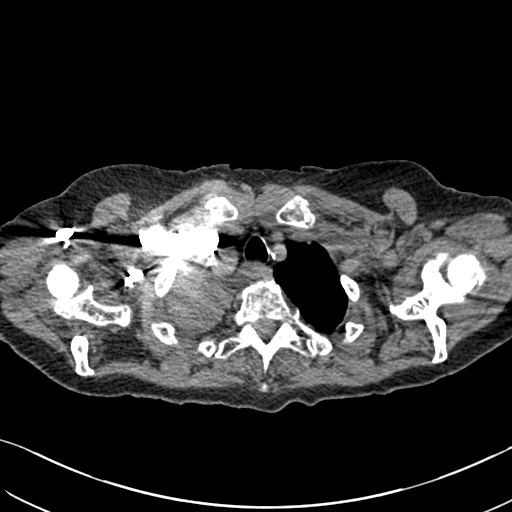
[im 281/300  lung]
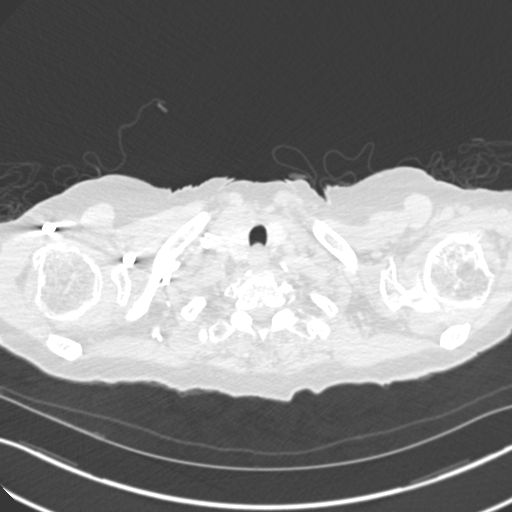

[Series 7: coronal mpr · coronal · 0.59mm/px · 2 of 86 slices shown]
[im 29/86  soft-tissue]
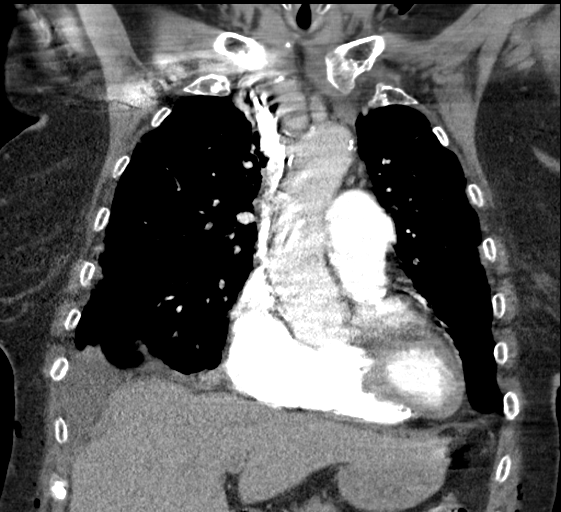
[im 57/86  soft-tissue]
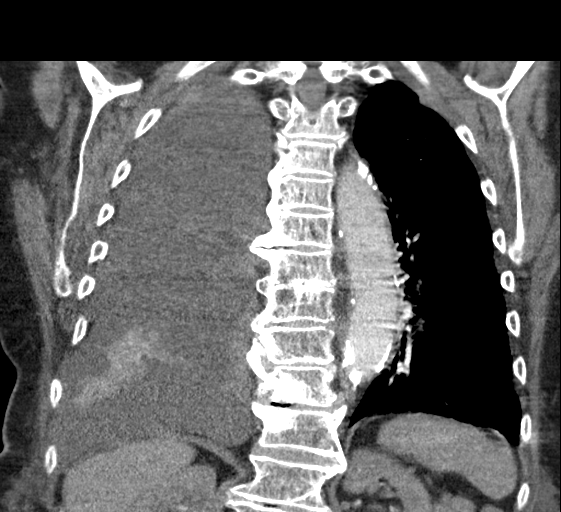

[17 of 46 positions shown; findings below may reference images not displayed]

FINDINGS: Cardiovascular: Main pulmonary arteries are patent without large
filling defects. Evaluation of the pulmonary arteries beyond the
lobar branches is limited. Cannot confidently evaluate the segmental
and subsegmental branches. There is one segmental branch in the left
lower lobe on sequence 5, image 140 that is indeterminate. However
favor artifact rather than true pulmonary embolism. No evidence for
large or central pulmonary embolism. Atherosclerotic calcifications
involving the coronary arteries and thoracic aorta. Ascending
thoracic aorta is ectatic measuring at least 3.9 cm but there is
significant pulsation artifact in this region. No significant
pericardial effusion.

Mediastinum/Nodes: Thyroid tissue is heterogeneous with a large
right thyroid lobe. Prominent AP window lymph node on sequence 4
image 34 measuring 1.1 cm short axis. Limited evaluation for right
hilar and subcarinal lymphadenopathy due to the right pleural fluid.
Concern for soft tissue fullness in the subcarinal and right
paratracheal region. No significant axillary lymph node enlargement.

Lungs/Pleura: Large right pleural effusion with scattered areas of
pleural thickening and nodularity. The large area pleural-based
thickening in the medial right lower chest on sequence 4 image 75
that measures 4.6 x 1.8 cm. Significant volume loss in the right
lower lobe. Extensive nodularity along the right lung fissures.
There appears to be a small azygos lobe. No evidence for left
pleural fluid. There is a suspicious pleural-based nodule in the
left lower lobe on sequence 6, 97 measuring up to 6 mm. Additional
nodule in the left upper lobe on image 60.

Upper Abdomen: Low-density fullness involving the left adrenal gland
probably represents an adenoma but technically indeterminate based
on the Hounsfield units. Mild fullness in the right adrenal gland.
No gross abnormality in the visualized liver.

Musculoskeletal: Multilevel degenerative changes in the thoracic
spine. No clear evidence for an osseous lesion. Degenerative changes
in both shoulders. Marked sclerosis in left glenoid region is likely
degenerative rather than neoplastic.

Review of the MIP images confirms the above findings.
IMPRESSION: 1. Limited evaluation for pulmonary embolism but no evidence for a
large central pulmonary embolism. Limited evaluation beyond the
lobar arteries.
2. Large right pleural effusion with extensive pleural based
nodularity throughout the right lung and a small amount of
nodularity in the left lung. Findings are highly concerning for a
neoplastic process and metastatic disease. Suspicion for mediastinal
lymphadenopathy but limited evaluation due to the adjacent pleural
fluid.
3.  Aortic Atherosclerosis ([IY]-[IY]).

These results were called by telephone at the time of interpretation
on [DATE] at [DATE] to provider SEIPONE RUTH , who verbally
acknowledged these results.

## 2021-06-14 MED ORDER — IOHEXOL 350 MG/ML SOLN
75.0000 mL | Freq: Once | INTRAVENOUS | Status: AC | PRN
  Administered 2021-06-14: 75 mL via INTRAVENOUS

## 2021-06-14 MED ORDER — LIDOCAINE HCL 1 % IJ SOLN
INTRAMUSCULAR | Status: AC
  Filled 2021-06-14: qty 20

## 2021-06-14 MED ORDER — ALBUTEROL SULFATE (2.5 MG/3ML) 0.083% IN NEBU
2.5000 mg | INHALATION_SOLUTION | RESPIRATORY_TRACT | Status: DC | PRN
  Administered 2021-06-14: 2.5 mg via RESPIRATORY_TRACT
  Filled 2021-06-14: qty 3

## 2021-06-14 MED ORDER — SODIUM CHLORIDE 0.9 % IV BOLUS
250.0000 mL | Freq: Once | INTRAVENOUS | Status: AC
  Administered 2021-06-14: 250 mL via INTRAVENOUS

## 2021-06-14 NOTE — Consult Note (Signed)
Medical Consultation  Peggy Howard WUJ:[id number] DOB: 01-20-1933 DOA: 06/12/2021 PCP: Lorrene Reid, PA-C   Requesting physician: Dr. Marcello Moores Date of consultation: 06/14/21 Reason for consultation: Dyspnea  Impression/Recommendations Dyspnea Acute hypoxic respiratory failure Pleural effusions w/ pulmonary nodules     - CXR shows mild right pleural effusion w/ associated right basilar atelectasis or infiltrate     - no fever, WBC is normal, hold on abx for now, check procal     - she denies any previous heart or lung conditions; she stopped smoking years ago.      - check CTA chest     - she did not improve w/ nebs     - documented I&Os show a fluid deficit; but let's check BNP     - UPDATE: CTA chest showed large pleural effusions and diffuse pulmonary nodules; concern for primary lung CA, sending for thoracentesis, onco consulted - appreciate assistance     - will get [facility] and CT ab/pelvis for staging  Remainder per primary team.  [facility] will follow-up again tomorrow. Please contact me if I can be of assistance in the meanwhile. Thank you for this consultation.  Chief Complaint: rectal prolapse  HPI:  Peggy Howard is a 87 y.o. female with medical history significant of rectal prolapse. She was admitted to the surgical service for repair on 06/12/21. She successfully completed a robot assisted rectopexy on 06/12/21 and was admitted to the floor. She reports that shortly after, she started having wheezing and cough. She was able to dismiss it for the first night, but it worsened yesterday. She was given some O2 by Junior and it helped. However, this morning she woke up with worsening cough and wheeze. She was given a breathing treatment, but this was not helpful. She felt a heaviness in her chest, but no pain. She did not experience orthopnea. Her O2 was increased and she feels a little better. However, as soon as it comes off, she feels short of breath again. TRH was consulted for  acute dyspnea.   Review of Systems:  Reports chest heaviness, but not discrete pain; reports dyspnea, RLE swelling w/ movement. Denies palpitations, lightheadedness, dizziness, N/V/D, orthopnea. Remainder of ROS is negative for all not mentioned in HPI.   Past Medical History:  Diagnosis Date   Arthritis    Rectal prolapse    Past Surgical History:  Procedure Laterality Date   ABDOMINAL HYSTERECTOMY     amputation of left finger      2021   BIOPSY  03/27/2021   Procedure: BIOPSY;  Surgeon: Irving Copas., MD;  Location: [facility];  Service: Gastroenterology;;   COLONOSCOPY WITH PROPOFOL N/A 03/27/2021   Procedure: COLONOSCOPY WITH PROPOFOL;  Surgeon: Irving Copas., MD;  Location: [facility];  Service: Gastroenterology;  Laterality: N/A;   JOINT REPLACEMENT     POLYPECTOMY  03/27/2021   Procedure: POLYPECTOMY;  Surgeon: Mansouraty, Telford Nab., MD;  Location: [facility];  Service: Gastroenterology;;   REPLACEMENT TOTAL KNEE Bilateral    XI ROBOT ASSISTED RECTOPEXY N/A 06/12/2021   Procedure: XI ROBOT ASSISTED RECTOPEXY;  Surgeon: Leighton Ruff, MD;  Location: [facility];  Service: General;  Laterality: N/A;   Social History:  reports that she has never smoked. She has never used smokeless tobacco. She reports current drug use. Drug: Benzodiazepines. She reports that she does not drink alcohol.  Allergies  Allergen Reactions   Vitamin D Analogs     "dizziness"   History reviewed. No pertinent family  history.  Prior to Admission medications   Medication Sig Start Date End Date Taking? Authorizing Provider  acetaminophen (TYLENOL) 500 MG tablet Take 1,000 mg by mouth every 6 (six) hours as needed for mild pain, fever or headache.   Yes [provider]  Ascorbic Acid (VITAMIN C PO) Take 1 tablet by mouth daily.   Yes [provider]  ferrous sulfate 325 (65 FE) MG tablet Take 325 mg by mouth daily with breakfast.   Yes [provider]   Multiple Vitamin (MULTIVITAMIN WITH MINERALS) TABS tablet Take 1 tablet by mouth daily.   Yes [provider]  Multiple Vitamins-Minerals (PRESERVISION AREDS 2 PO) Take 1 tablet by mouth 2 (two) times daily.   Yes [provider]  psyllium (METAMUCIL SMOOTH TEXTURE) 58.6 % powder Take 1 packet by mouth 3 (three) times daily. Patient taking differently: Take 1 packet by mouth in the morning and at bedtime. 04/03/21  Yes Pattricia Boss, MD  vitamin E 180 MG (400 UNITS) capsule Take 400 Units by mouth daily.   Yes [provider]  bisacodyl (DULCOLAX) 5 MG EC tablet Take 1 tablet (5 mg total) by mouth 2 (two) times daily. 04/03/21   Pattricia Boss, MD  glycerin adult 2 g suppository Place 1 suppository rectally as needed for constipation. Patient not taking: No sig reported 04/05/21   Couture, Cortni S, PA-C  mesalamine (CANASA) 1000 MG suppository Place 1 suppository (1,000 mg total) rectally at bedtime. 03/28/21   Little Ishikawa, MD  traMADol (ULTRAM) 50 MG tablet Take 0.5 tablets (25 mg total) by mouth every 6 (six) hours as needed. Patient not taking: No sig reported 04/05/21   Couture, [employer], PA-C   Physical Exam: Blood pressure (!) 147/87, pulse 74, temperature (!) 97.5 F (36.4 C), resp. rate 18, height 5\' 4"  (1.626 m), weight 80.4 kg, SpO2 96 %. Vitals:   06/14/21 0545 06/14/21 0654  BP: (!) 147/87   Pulse: 74   Resp: 18   Temp: (!) 97.5 F (36.4 C)   SpO2: 96% 96%    General: 86 y.o. female resting in bed in NAD Eyes: PERRL, normal sclera ENMT: Nares patent w/o discharge, orophaynx clear, dentition normal, ears w/o discharge/lesions/ulcers Neck: Supple, trachea midline Cardiovascular: RRR, +S1, S2, no m/g/r, equal pulses throughout Respiratory: decrease on R base, but good air movement otherwise, she does have decent UAT/wheeze especially as she is bearing down on her throat, but this wheeze is not really present in the lower resp tract, no r/r,  slightly increased WOB on 3L [place]  GI: BS+, NDNT, no masses noted, no organomegaly noted MSK: No e/c/c Skin: No rashes, bruises, ulcerations noted Neuro: A&O x 3, no focal deficits Psyc: Appropriate interaction and affect, calm/cooperative  Labs on Admission:  Basic Metabolic Panel: Recent Labs  Lab 06/13/21 0421 06/14/21 0409  NA 140 139  K 4.3 4.8  CL 105 106  CO2 27 27  GLUCOSE 109* 109*  BUN 13 19  CREATININE 0.74 0.95  CALCIUM 8.5* 8.5*   Liver Function Tests: No results for input(s): AST, ALT, ALKPHOS, BILITOT, PROT, ALBUMIN in the last 168 hours. No results for input(s): LIPASE, AMYLASE in the last 168 hours. No results for input(s): AMMONIA in the last 168 hours. CBC: Recent Labs  Lab 06/13/21 0421 06/14/21 0409  WBC 8.4 7.9  HGB 11.7* 12.3  HCT 37.2 39.8  MCV 99.2 99.3  PLT 277 290   Cardiac Enzymes: No results for input(s): CKTOTAL,  CKMB, CKMBINDEX, TROPONINI in the last 168 hours. BNP: Invalid input(s): POCBNP CBG: No results for input(s): GLUCAP in the last 168 hours.  Radiological Exams on Admission: DG CHEST PORT 1 VIEW  Result Date: 06/14/2021 CLINICAL DATA:  Shortness of breath. EXAM: PORTABLE CHEST 1 VIEW COMPARISON:  June 17, 2017. FINDINGS: Stable cardiomediastinal silhouette. No pneumothorax is noted. Left lung is clear. Mild right pleural effusion is noted with associated right basilar atelectasis or infiltrate. Bony thorax is unremarkable. IMPRESSION: Mild right pleural effusion is noted with associated right basilar atelectasis or infiltrate. Aortic Atherosclerosis (ICD10-I70.0). Electronically Signed   By: Marijo Conception M.D.   On: 06/14/2021 11:51    EKG: Independently reviewed. NSR, no st elevations  Time spent: 60 minutes  Celines Femia A Kylan Veach DO [facility]  If 7PM-7AM, please contact night-coverage [email or web address] 06/14/2021, 12:32 PM

## 2021-06-14 NOTE — Progress Notes (Signed)
Rm 1514, Peggy Howard, 42F Rectopexy on 7/20. Is having severed expiratory Wheezing, SOB with exertion,  placed on 2L 02 06/13/21. Notifying respiratory and MD.

## 2021-06-14 NOTE — Procedures (Signed)
PROCEDURE SUMMARY:  Successful US guided diagnostic and therapeutic right thoracentesis. Yielded 1.4 liters of bloody fluid. Pt tolerated procedure well. No immediate complications.  Specimen was sent for labs. CXR ordered.  EBL < 5 mL  Docia Barrier PA-C 06/14/2021 4:38 PM

## 2021-06-14 NOTE — Progress Notes (Signed)
2 Days Post-Op robotic rectopexy Subjective: Having min pain.  Tolerating a diet.  Had a BM  Gets SOB when walking.  Had acute worsening of symptoms this AM.  Was placed on 3L [place] this am and given a breathing treatment per pt, but states this did not help much.  States that her RLE swells when she walks  Objective: Vital signs in last 24 hours: Temp:  [97.5 F (36.4 C)-98.8 F (37.1 C)] 97.5 F (36.4 C) (07/22 0545) Pulse Rate:  [74-103] 74 (07/22 0545) Resp:  [16-18] 18 (07/22 0545) BP: (137-158)/(70-87) 147/87 (07/22 0545) SpO2:  [90 %-96 %] 96 % (07/22 0654) Weight:  [80.4 kg] 80.4 kg (07/22 0500)   Intake/Output from previous day: 07/21 0701 - 07/22 0700 In: 240 [P.O.:240] Out: 2375 [Urine:2300; Drains:75] Intake/Output this shift: Total I/O In: 120 [P.O.:120] Out: -    General appearance: alert and cooperative GI: soft, appropriately tender Lungs: expiratory wheezing present Incision: no significant drainage Ext: No edema noted JP: SS fluid   Lab Results:  Recent Labs    06/13/21 0421 06/14/21 0409  WBC 8.4 7.9  HGB 11.7* 12.3  HCT 37.2 39.8  PLT 277 290    BMET Recent Labs    06/13/21 0421 06/14/21 0409  NA 140 139  K 4.3 4.8  CL 105 106  CO2 27 27  GLUCOSE 109* 109*  BUN 13 19  CREATININE 0.74 0.95  CALCIUM 8.5* 8.5*    PT/INR No results for input(s): LABPROT, INR in the last 72 hours. ABG No results for input(s): PHART, HCO3 in the last 72 hours.  Invalid input(s): PCO2, PO2  MEDS, Scheduled  bisacodyl  5 mg Oral BID   enoxaparin (LOVENOX) injection  40 mg Subcutaneous Q24H   feeding supplement  237 mL Oral BID BM   psyllium  1 packet Oral BID   saccharomyces boulardii  250 mg Oral BID    Studies/Results: No results found.  Assessment: s/p Procedure(s): XI ROBOT ASSISTED RECTOPEXY Patient Active Problem List   Diagnosis Date Noted   Rectal prolapse 06/12/2021   GIB (gastrointestinal bleeding) 03/26/2021   BRBPR (bright  red blood per rectum) 03/25/2021   Abdominal pain 03/25/2021   Low back pain 03/25/2021   Overweight (BMI 25.0-29.9) 07/16/2017   Colonoscopy refused 07/16/2017   Refuses treatment 07/16/2017   Parent refuses immunizations 07/16/2017   Patient refuses to take medication 07/16/2017   History of cigarette smoking 06/26/2017   Other fatigue 06/26/2017   Memory changes 06/26/2017   Community acquired pneumonia of right middle lobe of lung    External hemorrhoids 01/26/2008    Expected post op course  Plan: Cont soft foods Stat CXR and EKG for SOB Will consult hospitalist to assist with further workup   LOS: 2 days     .Rosario Adie, MD [facility], [place]    06/14/2021 11:13 AM

## 2021-06-14 NOTE — Progress Notes (Signed)
Right lower extremity venous duplex has been completed. Preliminary results can be found in CV Proc through chart review.   06/14/21 2:54 PM Peggy Howard RVT

## 2021-06-14 NOTE — Consult Note (Signed)
Referral MD  Reason for Referral: Right pleural effusion and pulmonary nodules  No chief complaint on file. : I got short of breath.  HPI: Ms. Plato is a very charming 87 year old [name] female.  She has been in status for 6 years.  She married a [name] serviceman when he was serving over in [place].  She is from [place].  She does not see doctors.  She has not had a mammogram.  She recently had a colonoscopy.  She had rectal prolapse that required surgery.  She she was admitted because of the rectal prolapse and surgery for this..  She underwent surgery yesterday.  This went well.  However, she began to develop some shortness of breath.  She had a chest x-ray which showed a right pleural effusion.  She then had a CT angiogram.  Thankfully, this not show any pulmonary emboli.  However, showed a large right pleural effusion with scattered areas of pleural thickening and nodularity.  The pleural-based thickening measured 4.6 x 1.8 cm.  She had nodularity along the right lung fissure.  She had a pleural-based nodule in the left lower lung.  She she had a CT of the abdomen and pelvis on 03/25/2021.  There was mention of multiple pulmonary nodules at the right lung base which were felt to be benign.  There is no mention of any pleural effusion.  She underwent a thoracentesis today.  The thoracentesis removed 1.7 L of bloody fluid.  She does feel better.  She has had no weight loss.  Her appetites been down because of the rectal issue.  She had a colonoscopy in May 2022.  Nothing suspicious was found.  She had tubular adenoma in the cecum.  In the rectum, there is a lesion consistent with the mucosal prolapse.  She has had a remote history of tobacco use.  She stopped about 44 years ago.  There is no occupational exposures.  She has had no cough.  There is been no chest wall pain.  There is no hemoptysis.  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  Overall, her performance status is probably ECOG 1.    Past Medical History:  Diagnosis Date   Arthritis    Rectal prolapse   :   Past Surgical History:  Procedure Laterality Date   ABDOMINAL HYSTERECTOMY     amputation of left finger      2021   BIOPSY  03/27/2021   Procedure: BIOPSY;  Surgeon: Irving Copas., MD;  Location: [facility];  Service: Gastroenterology;;   COLONOSCOPY WITH PROPOFOL N/A 03/27/2021   Procedure: COLONOSCOPY WITH PROPOFOL;  Surgeon: Irving Copas., MD;  Location: [facility];  Service: Gastroenterology;  Laterality: N/A;   JOINT REPLACEMENT     POLYPECTOMY  03/27/2021   Procedure: POLYPECTOMY;  Surgeon: Mansouraty, Telford Nab., MD;  Location: [facility];  Service: Gastroenterology;;   REPLACEMENT TOTAL KNEE Bilateral    XI ROBOT ASSISTED RECTOPEXY N/A 06/12/2021   Procedure: XI ROBOT ASSISTED RECTOPEXY;  Surgeon: Leighton Ruff, MD;  Location: [facility];  Service: General;  Laterality: N/A;  :   Current Facility-Administered Medications:    acetaminophen (TYLENOL) tablet 1,000 mg, 1,000 mg, Oral, O0H PRN, Leighton Ruff, MD, 2,122 mg at 06/14/21 1031   albuterol (PROVENTIL) (2.5 MG/3ML) 0.083% nebulizer solution 2.5 mg, 2.5 mg, Nebulization, Q4H PRN, Erroll Luna, MD, 2.5 mg at 06/14/21 [phone]  alum & mag hydroxide-simeth (MAALOX/MYLANTA) 200-200-20 MG/5ML suspension 30 mL, 30 mL, Oral, Z5G PRN, Leighton Ruff, MD   bisacodyl (DULCOLAX) EC tablet 5 mg, 5 mg, Oral, BID, Leighton Ruff, MD, 5 mg at 38/75/64 1020   enoxaparin (LOVENOX) injection 40 mg, 40 mg, Subcutaneous, P32R, Leighton Ruff, MD, 40 mg at 51/88/41 1020   feeding supplement (ENSURE SURGERY) liquid 237 mL, 237 mL, Oral, BID BM, Leighton Ruff, MD, 660 mL at 06/14/21 1313   HYDROmorphone (DILAUDID) injection 0.5 mg, 0.5 mg, Intravenous, Y3K PRN, Leighton Ruff, MD   lidocaine (XYLOCAINE) 1 % (with pres) injection, , , ,     ondansetron (ZOFRAN) tablet 4 mg, 4 mg, Oral, Q6H PRN **OR** ondansetron (ZOFRAN) injection 4 mg, 4 mg, Intravenous, Z6W PRN, Leighton Ruff, MD   psyllium (HYDROCIL/METAMUCIL) 1 packet, 1 packet, Oral, BID, Leighton Ruff, MD, 1 packet at 06/14/21 1020   saccharomyces boulardii (FLORASTOR) capsule 250 mg, 250 mg, Oral, BID, Leighton Ruff, MD, 109 mg at 06/14/21 1020   simethicone (MYLICON) chewable tablet 40 mg, 40 mg, Oral, N2T PRN, Leighton Ruff, MD   traMADol Veatrice Bourbon) tablet 25 mg, 25 mg, Oral, F5D PRN, Leighton Ruff, MD:   bisacodyl  5 mg Oral BID   enoxaparin (LOVENOX) injection  40 mg Subcutaneous Q24H   feeding supplement  237 mL Oral BID BM   lidocaine       psyllium  1 packet Oral BID   saccharomyces boulardii  250 mg Oral BID  :   Allergies  Allergen Reactions   Vitamin D Analogs     "dizziness"  :  History reviewed. No pertinent family history.:   Social History   Socioeconomic History   Marital status: Widowed    Spouse name: Not on file   Number of children: Not on file   Years of education: Not on file   Highest education level: Not on file  Occupational History   Not on file  Tobacco Use   Smoking status: Never   Smokeless tobacco: Never  Vaping Use   Vaping Use: Never used  Substance and Sexual Activity   Alcohol use: No   Drug use: Yes    Types: Benzodiazepines   Sexual activity: Never  Other Topics Concern   Not on file  Social History Narrative   Not on file   Social Determinants of Health   Financial Resource Strain: Not on file  Food Insecurity: Not on file  Transportation Needs: Not on file  Physical Activity: Not on file  Stress: Not on file  Social Connections: Not on file  Intimate Partner Violence: Not on file  :  Review of Systems  Constitutional: Negative.   HENT: Negative.    Eyes: Negative.   Respiratory:  Positive for shortness of breath.   Cardiovascular: Negative.   Gastrointestinal:  Positive for abdominal pain.   Genitourinary: Negative.   Musculoskeletal: Negative.   Skin: Negative.   Neurological: Negative.   Endo/Heme/Allergies: Negative.   Psychiatric/Behavioral: Negative.      Exam:  This is a youthful appearing [name] female in no obvious distress.  Vital signs show temperature of 99.  Pulse 79.  Blood pressure 162/75.  Weight is 177 pounds.  Head and neck exam shows no scleral icterus.  She has no adenopathy in the neck.  Thyroid is not palpable.  Lungs are clear in the left lung field.  There is decrease in the right lung.  She has some crackles and wheezing in the right side.  Cardiac exam regular rate and rhythm with no murmurs, rubs or bruits.  Abdomen is soft.  Bowel sounds are present.  There is some tenderness in the lower abdomen from her recent surgery.  There is no obvious hepatomegaly.  Extremities shows no clubbing, cyanosis or edema.  Neurological exam shows no focal neurological deficits.  Skin exam shows no suspicious skin lesions.    Patient Vitals for the past 24 hrs:  BP Temp Temp src Pulse Resp SpO2 Weight  06/14/21 1633 (!) 162/75 -- -- 79 -- 94 % --  06/14/21 1611 (!) 151/78 -- -- 81 -- 94 % --  06/14/21 0654 -- -- -- -- -- 96 % --  06/14/21 0545 (!) 147/87 (!) 97.5 F (36.4 C) -- 74 18 96 % --  06/14/21 0500 -- -- -- -- -- -- 177 lb 4 oz (80.4 kg)  06/13/21 2204 137/70 98.3 F (36.8 C) -- 76 18 94 % --  06/13/21 1813 (!) 158/84 98.8 F (37.1 C) Oral 81 18 95 % --      Recent Labs    06/13/21 0421 06/14/21 0409  WBC 8.4 7.9  HGB 11.7* 12.3  HCT 37.2 39.8  PLT 277 290    Recent Labs    06/13/21 0421 06/14/21 0409  NA 140 139  K 4.3 4.8  CL 105 106  CO2 27 27  GLUCOSE 109* 109*  BUN 13 19  CREATININE 0.74 0.95  CALCIUM 8.5* 8.5*    Blood smear review: None  Pathology: Pending    Assessment and Plan: Ms. Wickard is a very charming 87 year old Caucasian female.  She is originally from [place].  She does have some decreased hearing.  She has  what certainly is likely a malignant effusion.  She has not smoked for over 40 years.  Again, I cannot help but wonder if this is not metastatic melanoma given that she had this subungual melanoma removed little over a year ago.  The effusion pathology will not be until at least Monday.  Says that the effusion was removed, I would repeat her CT scan.  I will be incredibly interesting to see if there is anything in the liver.  Back in May, there is a indeterminate 3 mm lesion in the liver.  If this is non-small cell lung cancer, that I would clearly send the fluid off for molecular markers to see if she has any type of mutation that we might be able to target.  My sense is that she would not want to have any treatment if we find malignancy.  She certainly has been "hands off" with her own health care.  She is 87 years old so I have to say she must be doing something right to have made it this far without a lot of problems.  Again, she is incredibly delightful.  I really do not see a much about cancer to her.  Her youngest son was with [name].  I would prefer not to make any assumptions until we have a definitive diagnosis.  I would, again, order a CT of the body to see what is going on.  I would order LFTs.  We will order an LDH.  Again it was a true pleasure to have met her.  Ms. Houser is incredibly charming and I really enjoyed talking with her.  Lattie Haw, MD  Darlyn Chamber 29:11

## 2021-06-15 ENCOUNTER — Encounter (HOSPITAL_COMMUNITY): Payer: Self-pay | Admitting: General Surgery

## 2021-06-15 ENCOUNTER — Inpatient Hospital Stay (HOSPITAL_COMMUNITY): Payer: Medicare Other

## 2021-06-15 DIAGNOSIS — J90 Pleural effusion, not elsewhere classified: Secondary | ICD-10-CM | POA: Diagnosis not present

## 2021-06-15 DIAGNOSIS — R0603 Acute respiratory distress: Secondary | ICD-10-CM | POA: Diagnosis not present

## 2021-06-15 DIAGNOSIS — K623 Rectal prolapse: Principal | ICD-10-CM

## 2021-06-15 DIAGNOSIS — R918 Other nonspecific abnormal finding of lung field: Secondary | ICD-10-CM | POA: Diagnosis not present

## 2021-06-15 DIAGNOSIS — Z85820 Personal history of malignant melanoma of skin: Secondary | ICD-10-CM | POA: Diagnosis not present

## 2021-06-15 LAB — COMPREHENSIVE METABOLIC PANEL
ALT: 15 U/L (ref 0–44)
AST: 17 U/L (ref 15–41)
Albumin: 3.1 g/dL — ABNORMAL LOW (ref 3.5–5.0)
Alkaline Phosphatase: 60 U/L (ref 38–126)
Anion gap: 8 (ref 5–15)
BUN: 16 mg/dL (ref 8–23)
CO2: 32 mmol/L (ref 22–32)
Calcium: 8.8 mg/dL — ABNORMAL LOW (ref 8.9–10.3)
Chloride: 101 mmol/L (ref 98–111)
Creatinine, Ser: 0.78 mg/dL (ref 0.44–1.00)
GFR, Estimated: >60 mL/min (ref >60)
Glucose, Bld: 160 mg/dL — ABNORMAL HIGH (ref 70–99)
Potassium: 4.7 mmol/L (ref 3.5–5.1)
Sodium: 141 mmol/L (ref 135–145)
Total Bilirubin: 0.9 mg/dL (ref 0.3–1.2)
Total Protein: 6.2 g/dL — ABNORMAL LOW (ref 6.5–8.1)

## 2021-06-15 LAB — CBC
HCT: 39.5 % (ref 36.0–46.0)
Hemoglobin: 12.6 g/dL (ref 12.0–15.0)
MCH: 31.2 pg (ref 26.0–34.0)
MCHC: 31.9 g/dL (ref 30.0–36.0)
MCV: 97.8 fL (ref 80.0–100.0)
Platelets: 289 10*3/uL (ref 150–400)
RBC: 4.04 MIL/uL (ref 3.87–5.11)
RDW: 13.7 % (ref 11.5–15.5)
WBC: 9.1 10*3/uL (ref 4.0–10.5)
nRBC: 0 % (ref 0.0–0.2)

## 2021-06-15 LAB — LACTATE DEHYDROGENASE: LDH: 170 U/L (ref 98–192)

## 2021-06-15 IMAGING — CT CT HEAD WO/W CM
3 of 6 series · 13 of 47 positions shown, 15 images · IV contrast (omnipaque)
Comparison: None.

CLINICAL DATA: Cancer of unknown primary, staging.

EXAM:
CT HEAD WITHOUT AND WITH CONTRAST
TECHNIQUE: Contiguous axial images were obtained from the base of the skull
through the vertex without and with intravenous contrast
CONTRAST:  80mL OMNIPAQUE IOHEXOL 350 MG/ML SOLN

[Series 3: head wo · axial · 0.47mm/px · z∈[+1605,+1730]mm · 8 of 31 slices shown, 10 images]
[im 3/31  brain]
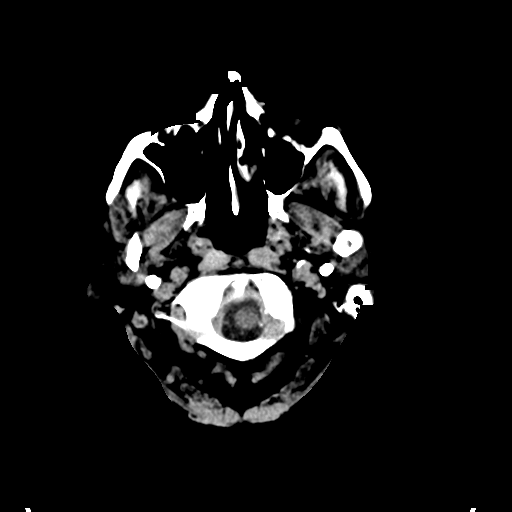
[im 3/31  bone]
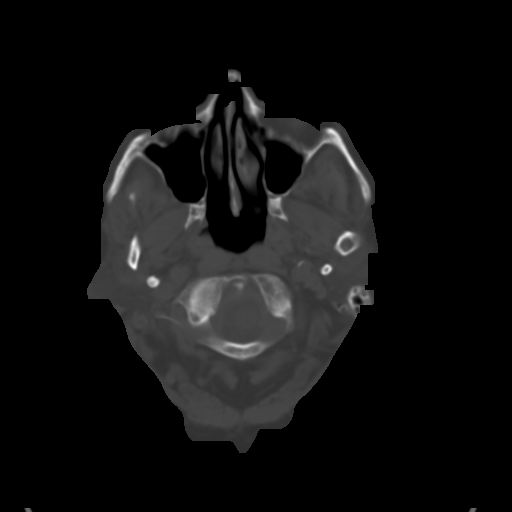
[im 7/31  brain]
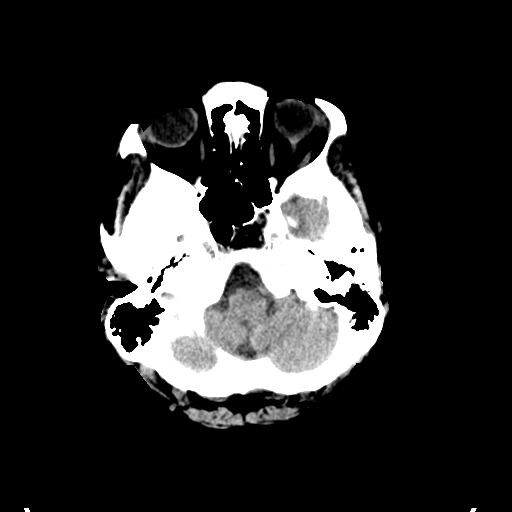
[im 11/31  brain]
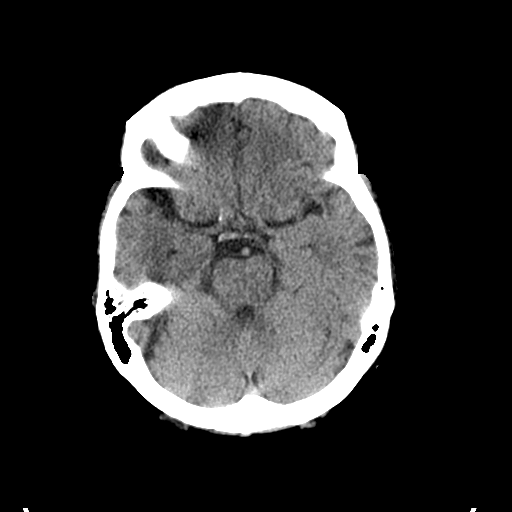
[im 13/31  brain]
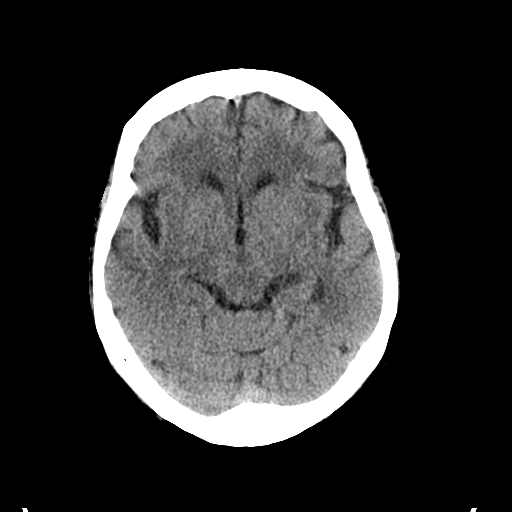
[im 18/31  brain]
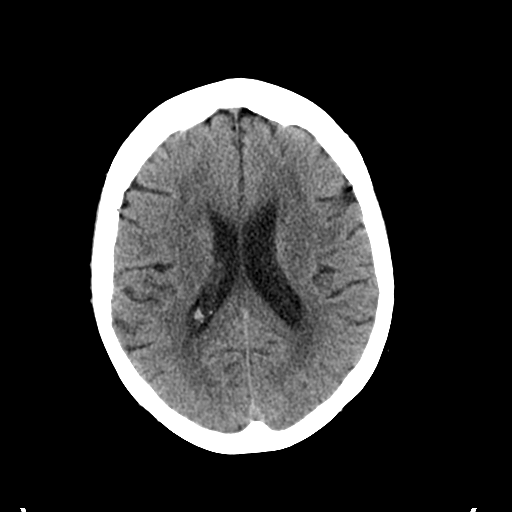
[im 18/31  bone]
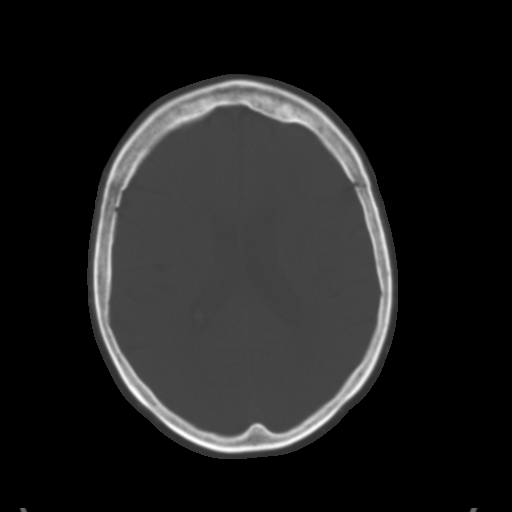
[im 20/31  brain]
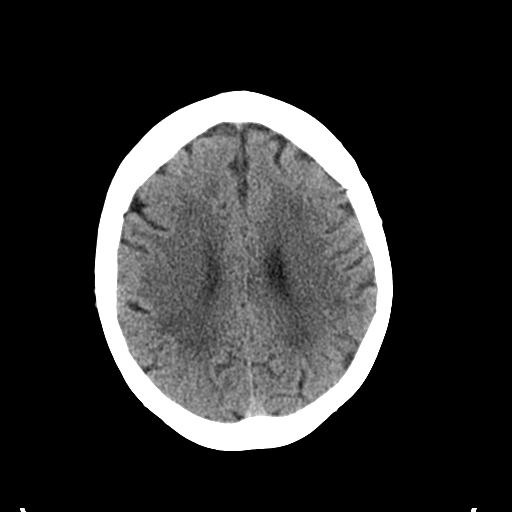
[im 24/31  brain]
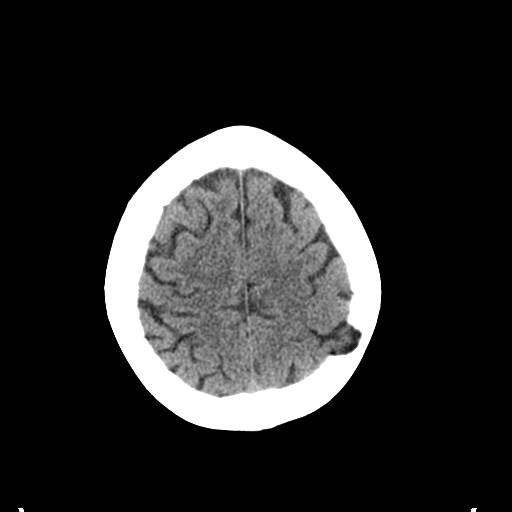
[im 28/31  brain]
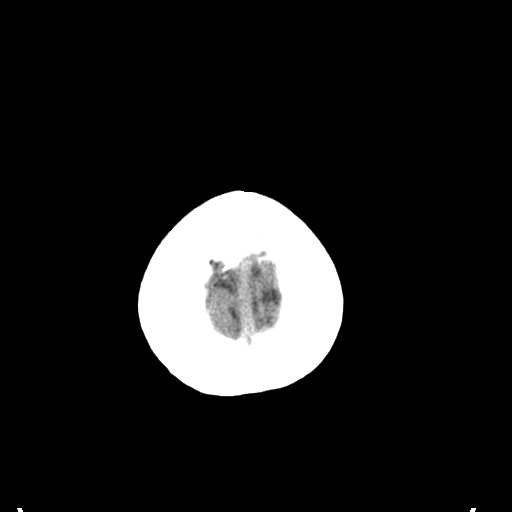

[Series 6: coronal soft tissue · coronal · 0.32mm/px · 3 of 68 slices shown]
[im 17/68  brain]
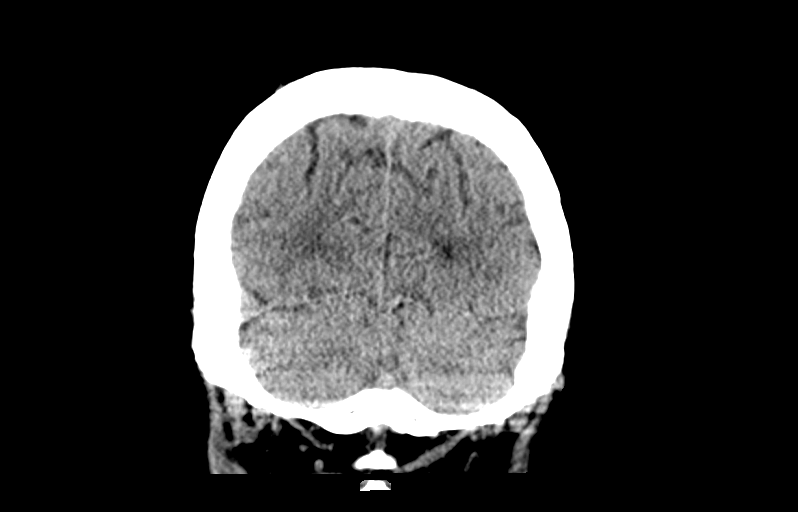
[im 34/68  brain]
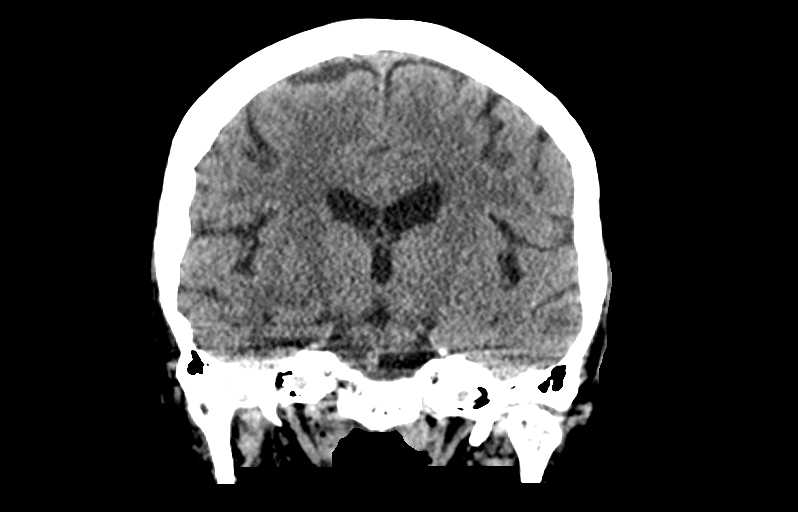
[im 51/68  brain]
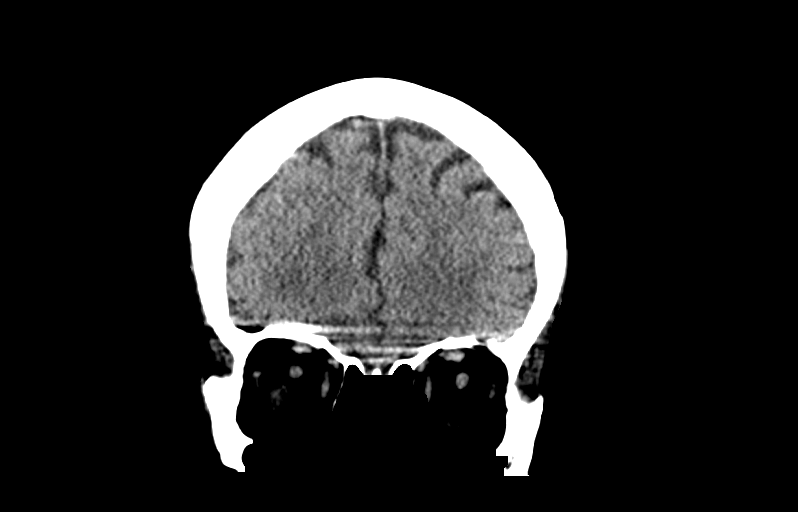

[Series 12: sagittal soft tissue · sagittal · 0.31mm/px · 2 of 55 slices shown]
[im 19/55  brain]
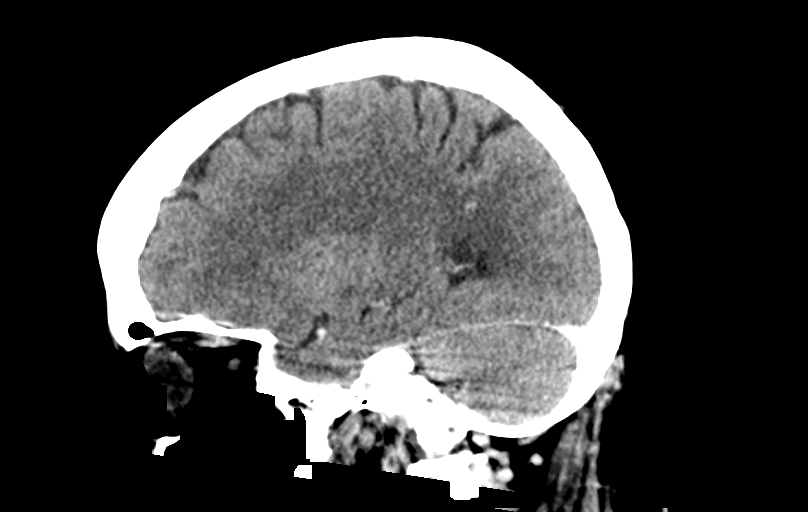
[im 37/55  brain]
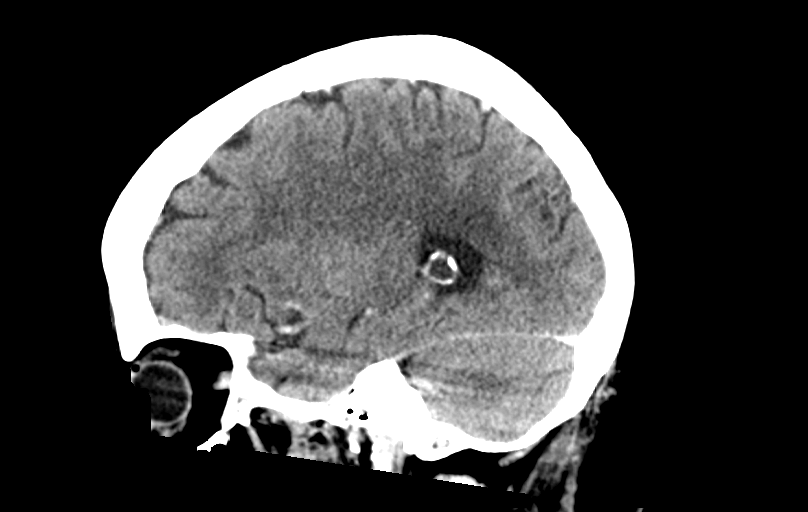

[13 of 47 positions shown; findings below may reference images not displayed]

FINDINGS: Brain: There is no evidence of an acute infarct, intracranial
hemorrhage, mass, midline shift, or extra-axial fluid collection.
The ventricles and sulci are within normal limits for age.
Hypodensities in the cerebral white matter bilaterally are
nonspecific but compatible with mild chronic small vessel ischemic
disease. No abnormal intracranial enhancement suggestive of
metastatic disease is seen. A small developmental venous anomaly is
incidentally noted in the right parietal lobe.

Vascular: Calcified atherosclerosis at the skull base. Major dural
venous sinuses and large arteries at the base of the brain are
grossly patent.

Skull: Well-defined 2 cm lucency scalloping the inner table of the
left parietal skull with internal fluid density, benign in
appearance.

Sinuses/Orbits: Visualized paranasal sinuses and mastoid air cells
are clear. Unremarkable orbits.

Other: None.
IMPRESSION: 1. No evidence of intracranial metastases.
2. Mild chronic small vessel ischemic disease.

## 2021-06-15 IMAGING — CT CT ABD-PELV W/ CM
2 of 5 series · 16 of 46 positions shown, 18 images · IV contrast (omnipaque)
Comparison: [DATE] [DATE], [DATE].  [DATE] [DATE], [DATE].

CLINICAL DATA: Cancer of unknown primary.

EXAM:
CT ABDOMEN AND PELVIS WITH CONTRAST
TECHNIQUE: Multidetector CT imaging of the abdomen and pelvis was performed
using the standard protocol following bolus administration of
intravenous contrast.
CONTRAST:  80mL OMNIPAQUE IOHEXOL 350 MG/ML SOLN

[Series 3: axial st · axial · 0.85mm/px · z∈[+895,+1295]mm · 13 of 94 slices shown, 15 images]
[im 7/94  soft-tissue]
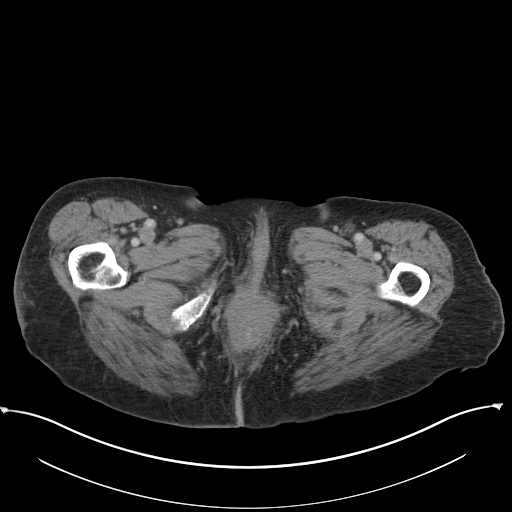
[im 7/94  bone]
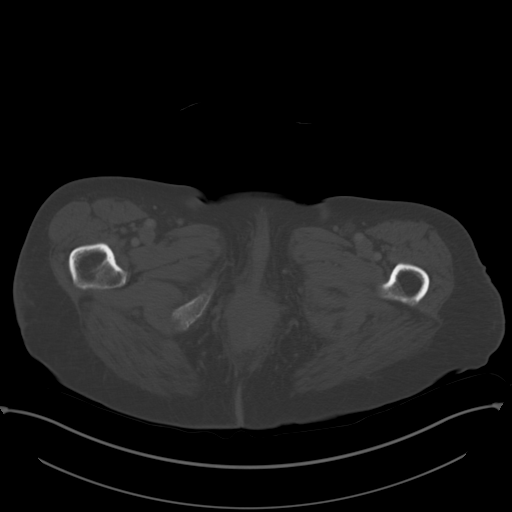
[im 13/94  soft-tissue]
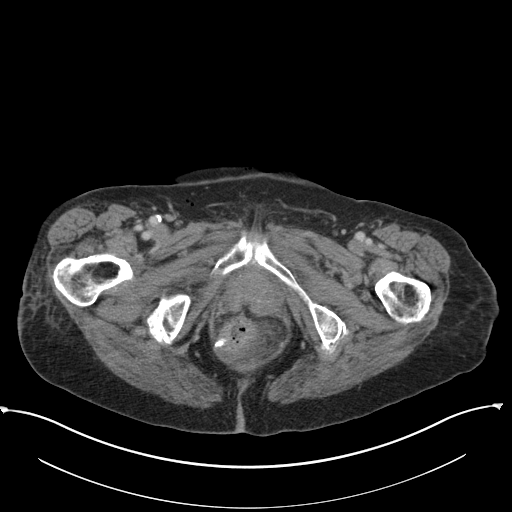
[im 19/94  soft-tissue]
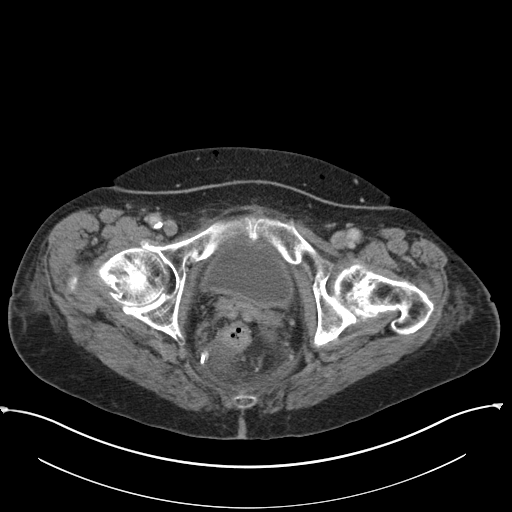
[im 25/94  soft-tissue]
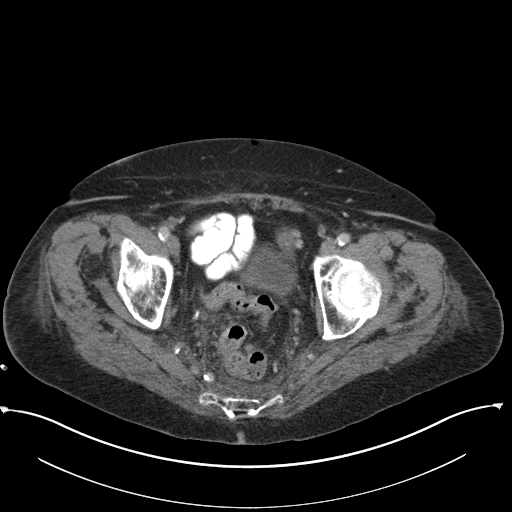
[im 32/94  soft-tissue]
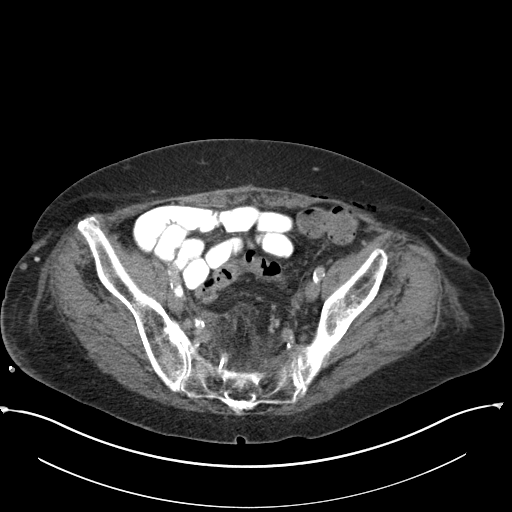
[im 38/94  soft-tissue]
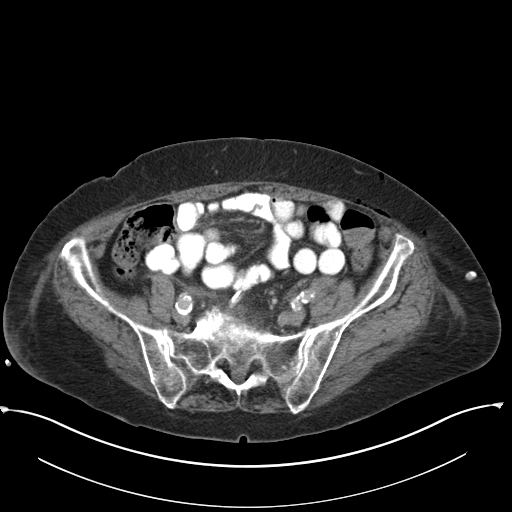
[im 50/94  soft-tissue]
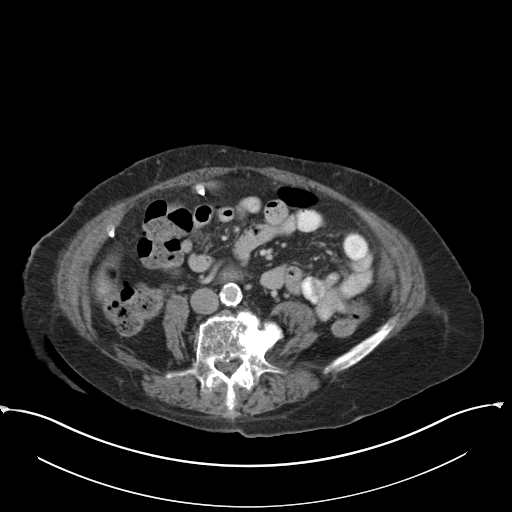
[im 56/94  soft-tissue]
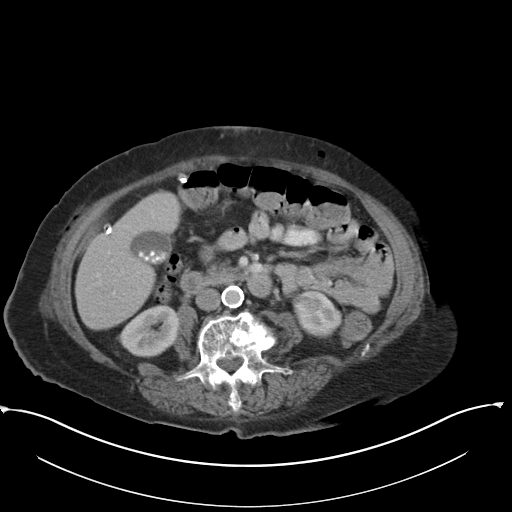
[im 63/94  soft-tissue]
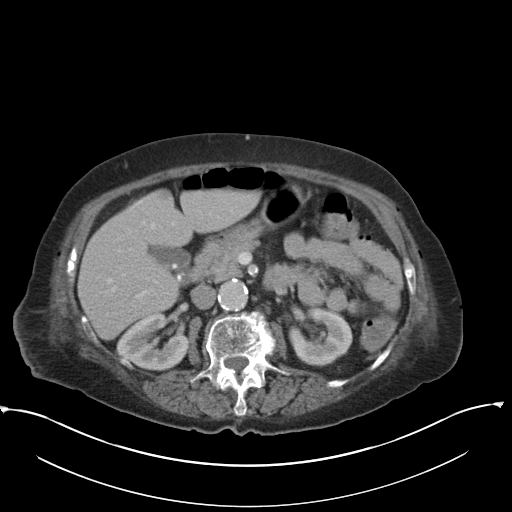
[im 63/94  bone]
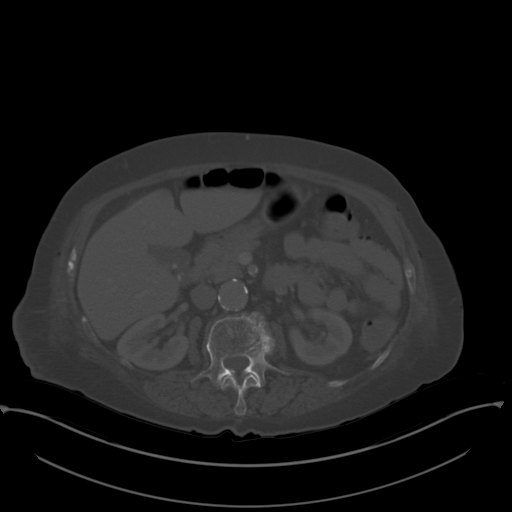
[im 69/94  soft-tissue]
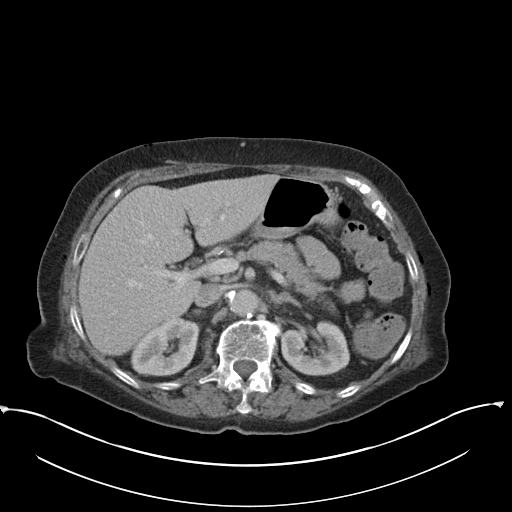
[im 75/94  soft-tissue]
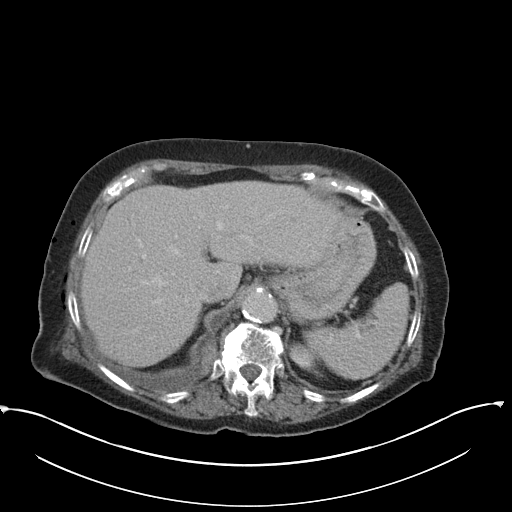
[im 81/94  soft-tissue]
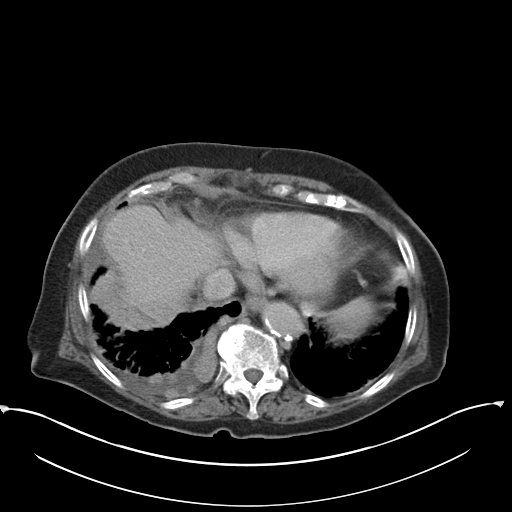
[im 87/94  soft-tissue]
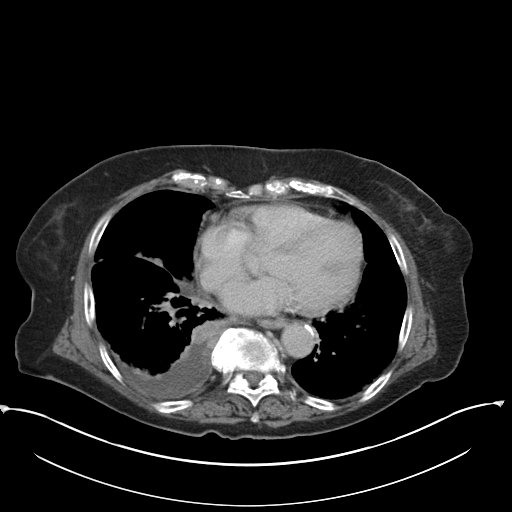

[Series 6: coronal st · coronal · 0.88mm/px · 3 of 138 slices shown]
[im 46/138  soft-tissue]
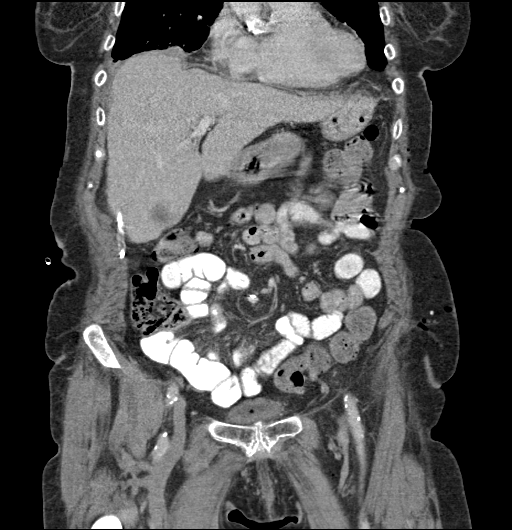
[im 61/138  soft-tissue]
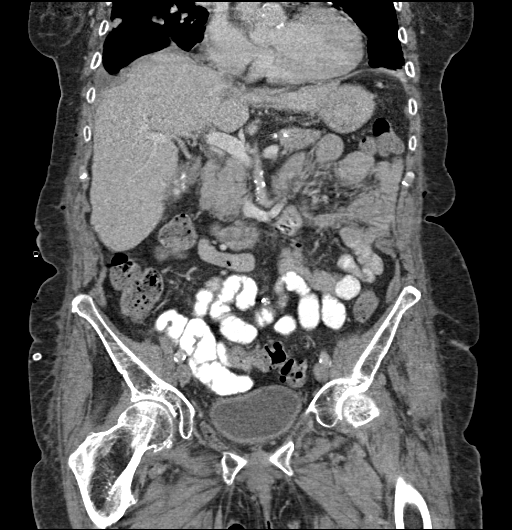
[im 77/138  soft-tissue]
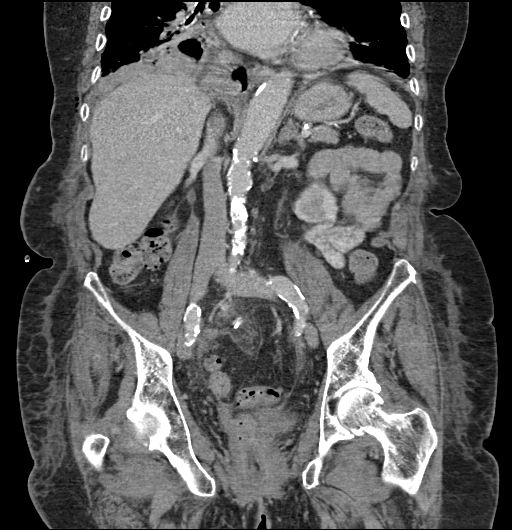

[16 of 46 positions shown; findings below may reference images not displayed]

FINDINGS: Lower chest: Small right pleural effusion is noted with associated
atelectasis. Multiple pleural based nodules are noted in the right
hemothorax consistent with metastatic disease.

Hepatobiliary: Cholelithiasis is noted. No biliary dilatation is
noted. The liver is unremarkable.

Pancreas: Unremarkable. No pancreatic ductal dilatation or
surrounding inflammatory changes.

Spleen: Normal in size without focal abnormality.

Adrenals/Urinary Tract: Adrenal glands are unremarkable. Kidneys are
normal, without renal calculi, focal lesion, or hydronephrosis.
Bladder is unremarkable.

Stomach/Bowel: The stomach appears normal. There is no evidence of
bowel obstruction. Stool is noted throughout the colon. Patient is
reportedly status post robotic assisted rectopexy. Mild stranding is
noted around the rectum consistent with postsurgical status.
Surgical drain is seen entering the right upper quadrant of the
abdomen with distal tip extending to the right perirectal region.

Vascular/Lymphatic: Aortic atherosclerosis. No enlarged abdominal or
pelvic lymph nodes.

Reproductive: Status post hysterectomy. No adnexal masses.

Other: Small amount of gas is seen within the subcutaneous tissues
anteriorly in the pelvis consistent with recent surgery. No abnormal
fluid collection is noted. No definite hernia is noted.

Musculoskeletal: No acute or significant osseous findings.
IMPRESSION: Patient is reportedly status post interval robotic assisted
rectopexy. Surgical drain is noted extending from right upper
quadrant of the abdomen with distal tip in the right perirectal
region. Expected postsurgical changes are noted around the rectum.

Cholelithiasis is noted.

Small right pleural effusion is again noted with associated
atelectasis. Multiple pleural based nodules are noted in the
visualized right hemithorax consistent with metastatic disease.

Aortic Atherosclerosis ([UV]-[UV]).

## 2021-06-15 MED ORDER — IOHEXOL 350 MG/ML SOLN
80.0000 mL | Freq: Once | INTRAVENOUS | Status: AC | PRN
  Administered 2021-06-15: 80 mL via INTRAVENOUS

## 2021-06-15 MED ORDER — IOHEXOL 9 MG/ML PO SOLN
500.0000 mL | ORAL | Status: AC
  Administered 2021-06-15 (×2): 500 mL via ORAL

## 2021-06-15 MED ORDER — SODIUM CHLORIDE (PF) 0.9 % IJ SOLN
INTRAMUSCULAR | Status: AC
  Filled 2021-06-15: qty 50

## 2021-06-15 NOTE — Progress Notes (Signed)
PROGRESS NOTE  Peggy Howard YCX:[id number] DOB: 09-03-1933   PCP: Lorrene Reid, PA-C  Patient is from: Home.   DOA: 06/12/2021 LOS: 3  Chief complaints:  No chief complaint on file.    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  Hospitalist service consulted for respiratory distress with shortness of breath, cough and wheeze.  She had CTA chest that showed large right pleural effusion and diffuse pulmonary nodules concerning for malignancy.  She underwent thoracocentesis with removal of 1.4 L of exudative fluid with improvement in her symptoms.  Oncology consulted and following.  CT head, abdomen and pelvis pending.  Subjective: Seen and examined earlier this morning.  No major events overnight of this morning.  No complaints.  She felt "much better" after thoracocentesis.  Denies chest pain, shortness of breath, GI or UTI symptoms.  Still with some dry cough.  Objective: Vitals:   06/14/21 1611 06/14/21 1633 06/14/21 2149 06/15/21 0406  BP: (!) 151/78 (!) 162/75 (!) 149/77 (!) 144/72  Pulse: 81 79 96 83  Resp:   18 16  Temp:   100.1 F (37.8 C) 98.5 F (36.9 C)  TempSrc:   Oral Oral  SpO2: 94% 94% 91% 94%  Weight:      Height:        Intake/Output Summary (Last 24 hours) at 06/15/2021 1111 Last data filed at 06/15/2021 0942 Gross per 24 hour  Intake 120 ml  Output 535 ml  Net -415 ml   Filed Weights   06/12/21 0659 06/13/21 0500 06/14/21 0500  Weight: 73.5 kg 80.4 kg 80.4 kg    Examination:  GENERAL: No apparent distress.  Nontoxic. HEENT: MMM.  Vision and hearing grossly intact.  NECK: Supple.  No apparent JVD.  RESP: 94% on 2 L.  No IWOB.  Somewhat coarse breath sounds. CVS:  RRR. Heart sounds normal.  ABD/GI/GU: BS+. Abd  soft.  Some tenderness from surgery MSK/EXT:  Moves extremities. No apparent deformity. No edema.  SKIN: no apparent skin lesion or wound NEURO: Awake, alert and oriented appropriately.  No apparent focal neuro deficit. PSYCH: Calm. Normal affect.   Procedures:  7/20-robotic rectopexy for rectal prolapse 7/22-right thoracocentesis with removal of 1.4 exudative fluid  Microbiology summarized: Pleural fluid culture NGTD. Assessment & Plan: Respiratory distress due to right pleural effusion.  CTA chest negative for PE but  large right pleural effusion, pleural-based nodularity and mediastinal LAD concerning for malignancy.  Breathing improved with thoracocentesis.  Pleural fluid culture NGTD.  BNP within normal.  She has no documented desaturation although she is started on supplemental oxygen. -Wean oxygen as able -Incentive spirometry and ambulatory saturation -CT head, abdomen and pelvis for staging -Follow pleural fungal culture and cytology  Lung nodules/large pleural effusion-concern about malignancy.  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 -Imaging as above -Oncology following.  History of rectal prolapse s/p robotic rectopexy on 7/20 -Per general surgery.  Class I obesity Body mass index is 30.42 kg/m.         DVT prophylaxis:  enoxaparin (LOVENOX) injection 40 mg Start: 06/13/21 0800 SCD's Start: 06/12/21 1127  Code Status: Full code Family Communication: Patient and/or RN. Available if any question.  Level of care: Med-Surg Status is: Inpatient  Dispo: Per primary   Sch Meds:  Scheduled Meds:  bisacodyl  5 mg Oral BID   enoxaparin (LOVENOX) injection  40 mg Subcutaneous Q24H   feeding supplement  237 mL Oral BID BM   iohexol  500 mL Oral Q1H   psyllium  1 packet Oral BID   saccharomyces boulardii  250 mg Oral BID   Continuous Infusions: PRN Meds:.acetaminophen, albuterol, alum & mag hydroxide-simeth,  HYDROmorphone (DILAUDID) injection, ondansetron **OR** ondansetron (ZOFRAN) IV, simethicone, traMADol  Antimicrobials: Anti-infectives (From admission, onward)    Start     Dose/Rate Route Frequency Ordered Stop   06/12/21 2100  cefoTEtan (CEFOTAN) 2 g in sodium chloride 0.9 % 100 mL IVPB        2 g 200 mL/hr over 30 Minutes Intravenous Every 12 hours 06/12/21 1126 06/12/21 2052   06/12/21 0600  cefoTEtan (CEFOTAN) 2 g in sodium chloride 0.9 % 100 mL IVPB        2 g 200 mL/hr over 30 Minutes Intravenous On call to O.R. 06/11/21 7989 06/12/21 0840        I have personally reviewed the following labs and images: CBC: Recent Labs  Lab 06/13/21 0421 06/14/21 0409 06/15/21 0349  WBC 8.4 7.9 9.1  HGB 11.7* 12.3 12.6  HCT 37.2 39.8 39.5  MCV 99.2 99.3 97.8  PLT 277 290 289   BMP &GFR Recent Labs  Lab 06/13/21 0421 06/14/21 0409 06/15/21 0349  NA 140 139 141  K 4.3 4.8 4.7  CL 105 106 101  CO2 27 27 32  GLUCOSE 109* 109* 160*  BUN 13 19 16   CREATININE 0.74 0.95 0.78  CALCIUM 8.5* 8.5* 8.8*   Estimated Creatinine Clearance: 52.8 mL/min (by C-G formula based on SCr of 0.78 mg/dL). Liver & Pancreas: Recent Labs  Lab 06/15/21 0349  AST 17  ALT 15  ALKPHOS 60  BILITOT 0.9  PROT 6.2*  ALBUMIN 3.1*   No results for input(s): LIPASE, AMYLASE in the last 168 hours. No results for input(s): AMMONIA in the last 168 hours. Diabetic: No results for input(s): HGBA1C in the last 72 hours. No results for input(s): GLUCAP in the last 168 hours. Cardiac Enzymes: No results for input(s): CKTOTAL, CKMB, CKMBINDEX, TROPONINI in the last 168 hours. No results for input(s): PROBNP in the last 8760 hours. Coagulation Profile: No results for input(s): INR, PROTIME in the last 168 hours. Thyroid Function Tests: No results for input(s): TSH, T4TOTAL, FREET4, T3FREE, THYROIDAB in the last 72 hours. Lipid Profile: No results for input(s): CHOL, HDL, LDLCALC, TRIG, CHOLHDL, LDLDIRECT  in the last 72 hours. Anemia Panel: No results for input(s): VITAMINB12, FOLATE, FERRITIN, TIBC, IRON, RETICCTPCT in the last 72 hours. Urine analysis:    Component Value Date/Time   COLORURINE YELLOW 03/25/2021 1537   APPEARANCEUR HAZY (A) 03/25/2021 1537   LABSPEC 1.027 03/25/2021 1537   PHURINE 6.0 03/25/2021 1537   GLUCOSEU NEGATIVE 03/25/2021 1537   HGBUR MODERATE (A) 03/25/2021 1537   BILIRUBINUR NEGATIVE 03/25/2021 1537   KETONESUR NEGATIVE 03/25/2021 1537   PROTEINUR NEGATIVE 03/25/2021 1537   NITRITE NEGATIVE 03/25/2021 1537   LEUKOCYTESUR NEGATIVE 03/25/2021 1537   Sepsis Labs: Invalid input(s): PROCALCITONIN, [place]  Microbiology: Recent Results (from the past 240 hour(s))  SARS CORONAVIRUS 2 (TAT 6-24 HRS) Nasopharyngeal Nasopharyngeal Swab     Status: None   Collection Time: 06/10/21 11:59 AM   Specimen: Nasopharyngeal Swab  Result Value Ref Range Status   SARS Coronavirus 2 NEGATIVE NEGATIVE Final  Comment: (NOTE) SARS-CoV-2 target nucleic acids are NOT DETECTED.  The SARS-CoV-2 RNA is generally detectable in upper and lower respiratory specimens during the acute phase of infection. Negative results do not preclude SARS-CoV-2 infection, do not rule out co-infections with other pathogens, and should not be used as the sole basis for treatment or other patient management decisions. Negative results must be combined with clinical observations, patient history, and epidemiological information. The expected result is Negative.  Fact Sheet for Patients: SugarRoll.be  Fact Sheet for Healthcare Providers: https://www.woods-mathews.com/  This test is not yet approved or cleared by the [place] FDA and  has been authorized for detection and/or diagnosis of SARS-CoV-2 by FDA under an Emergency Use Authorization (EUA). This EUA will remain  in effect (meaning this test can be used) for the duration of the COVID-19  declaration under Se ction 564(b)(1) of the Act, 21 U.S.C. section 360bbb-3(b)(1), unless the authorization is terminated or revoked sooner.  Performed at [facility], [place] [address] [place], [place] [address]   Body fluid culture w Gram Stain     Status: None (Preliminary result)   Collection Time: 06/14/21  4:41 PM   Specimen: PATH Cytology Pleural fluid  Result Value Ref Range Status   Specimen Description   Final    PLEURAL Performed at [facility] [address], [place], [place] [address]    Special Requests   Final    NONE Performed at [facility], [place] [address], [place], [place] [address]    Gram Stain PENDING  Incomplete   Culture   Final    NO GROWTH < 12 HOURS Performed at [facility], [place] [address] [place], [place] [address]    Report Status PENDING  Incomplete    Radiology Studies: CT Angio Chest Pulmonary Embolism (PE) W or WO Contrast  Result Date: 06/14/2021 CLINICAL DATA:  87 year old with chest pain or shortness of breath, pleurisy or effusion suspected. EXAM: CT ANGIOGRAPHY CHEST WITH CONTRAST TECHNIQUE: Multidetector CT imaging of the chest was performed using the standard protocol during bolus administration of intravenous contrast. Multiplanar CT image reconstructions and MIPs were obtained to evaluate the vascular anatomy. CONTRAST:  88mL OMNIPAQUE IOHEXOL 350 MG/ML SOLN COMPARISON:  Chest radiograph 06/14/2021 FINDINGS: Cardiovascular: Main pulmonary arteries are patent without large filling defects. Evaluation of the pulmonary arteries beyond the lobar branches is limited. Cannot confidently evaluate the segmental and subsegmental branches. There is one segmental branch in the left lower lobe on sequence 5, image 140 that is indeterminate. However favor artifact rather than true pulmonary embolism. No evidence for large or central pulmonary embolism. Atherosclerotic calcifications involving the coronary  arteries and thoracic aorta. Ascending thoracic aorta is ectatic measuring at least 3.9 cm but there is significant pulsation artifact in this region. No significant pericardial effusion. Mediastinum/Nodes: Thyroid tissue is heterogeneous with a large right thyroid lobe. Prominent AP window lymph node on sequence 4 image 34 measuring 1.1 cm short axis. Limited evaluation for right hilar and subcarinal lymphadenopathy due to the right pleural fluid. Concern for soft tissue fullness in the subcarinal and right paratracheal region. No significant axillary lymph node enlargement. Lungs/Pleura: Large right pleural effusion with scattered areas of pleural thickening and nodularity. The large area pleural-based thickening in the medial right lower chest on sequence 4 image 75 that measures 4.6 x 1.8 cm. Significant volume loss in the right lower lobe. Extensive nodularity along the right lung fissures. There appears to be  a small azygos lobe. No evidence for left pleural fluid. There is a suspicious pleural-based nodule in the left lower lobe on sequence 6, 97 measuring up to 6 mm. Additional nodule in the left upper lobe on image 60. Upper Abdomen: Low-density fullness involving the left adrenal gland probably represents an adenoma but technically indeterminate based on the Hounsfield units. Mild fullness in the right adrenal gland. No gross abnormality in the visualized liver. Musculoskeletal: Multilevel degenerative changes in the thoracic spine. No clear evidence for an osseous lesion. Degenerative changes in both shoulders. Marked sclerosis in left glenoid region is likely degenerative rather than neoplastic. Review of the MIP images confirms the above findings. IMPRESSION: 1. Limited evaluation for pulmonary embolism but no evidence for a large central pulmonary embolism. Limited evaluation beyond the lobar arteries. 2. Large right pleural effusion with extensive pleural based nodularity throughout the right lung and  a small amount of nodularity in the left lung. Findings are highly concerning for a neoplastic process and metastatic disease. Suspicion for mediastinal lymphadenopathy but limited evaluation due to the adjacent pleural fluid. 3.  Aortic Atherosclerosis (ICD10-I70.0). These results were called by telephone at the time of interpretation on 06/14/2021 at 2:55 pm to provider Cherylann Ratel , who verbally acknowledged these results. Electronically Signed   By: Markus Daft M.D.   On: 06/14/2021 14:55   DG Chest Port 1 View  Result Date: 06/14/2021 CLINICAL DATA:  Status post right thoracentesis. EXAM: PORTABLE CHEST 1 VIEW COMPARISON:  Radiographs and CT earlier today FINDINGS: Decreased size of right pleural effusion post thoracentesis. Small residual pleural fluid persists. No pneumothorax. Cardiomegaly is similar. Unchanged mediastinal contours with aortic atherosclerosis. IMPRESSION: 1. Decreased size of right pleural effusion post thoracentesis. No pneumothorax. 2. Unchanged cardiomegaly. Electronically Signed   By: Keith Rake M.D.   On: 06/14/2021 17:07   DG CHEST PORT 1 VIEW  Result Date: 06/14/2021 CLINICAL DATA:  Shortness of breath. EXAM: PORTABLE CHEST 1 VIEW COMPARISON:  June 17, 2017. FINDINGS: Stable cardiomediastinal silhouette. No pneumothorax is noted. Left lung is clear. Mild right pleural effusion is noted with associated right basilar atelectasis or infiltrate. Bony thorax is unremarkable. IMPRESSION: Mild right pleural effusion is noted with associated right basilar atelectasis or infiltrate. Aortic Atherosclerosis (ICD10-I70.0). Electronically Signed   By: Marijo Conception M.D.   On: 06/14/2021 11:51   VAS [name] LOWER EXTREMITY VENOUS (DVT)  Result Date: 06/14/2021  Lower Venous DVT Study Patient Name:  KARIANN WECKER [facility]  Date of Exam:   06/14/2021 Medical Rec #: [id number]             Accession #:    [id number] Date of Birth: [birth year]             Patient Gender: F Patient Age:   85Y  Exam Location:  [facility] Procedure:      VAS [name] LOWER EXTREMITY VENOUS (DVT) Referring Phys: [id number] ALICIA THOMAS --------------------------------------------------------------------------------  Indications: Swelling.  Risk Factors: None identified. Comparison Study: No prior studies. Performing Technologist: Oliver Hum RVT  Examination Guidelines: A complete evaluation includes B-mode imaging, spectral Doppler, color Doppler, and power Doppler as needed of all accessible portions of each vessel. Bilateral testing is considered an integral part of a complete examination. Limited examinations for reoccurring indications may be performed as noted. The reflux portion of the exam is performed with the patient in reverse Trendelenburg.  +---------+---------------+---------+-----------+----------+--------------+ RIGHT    CompressibilityPhasicitySpontaneityPropertiesThrombus Aging +---------+---------------+---------+-----------+----------+--------------+ CFV      Full  Yes      Yes                                 +---------+---------------+---------+-----------+----------+--------------+ SFJ      Full                                                        +---------+---------------+---------+-----------+----------+--------------+ FV Prox  Full                                                        +---------+---------------+---------+-----------+----------+--------------+ FV Mid   Full                                                        +---------+---------------+---------+-----------+----------+--------------+ FV DistalFull                                                        +---------+---------------+---------+-----------+----------+--------------+ PFV      Full                                                        +---------+---------------+---------+-----------+----------+--------------+ POP      Full           Yes      Yes                                  +---------+---------------+---------+-----------+----------+--------------+ PTV      Full                                                        +---------+---------------+---------+-----------+----------+--------------+ PERO     Full                                                        +---------+---------------+---------+-----------+----------+--------------+   +----+---------------+---------+-----------+----------+--------------+ LEFTCompressibilityPhasicitySpontaneityPropertiesThrombus Aging +----+---------------+---------+-----------+----------+--------------+ CFV Full           Yes      Yes                                 +----+---------------+---------+-----------+----------+--------------+     Summary: RIGHT: - There is no evidence of deep vein  thrombosis in the lower extremity.  - No cystic structure found in the popliteal fossa.  LEFT: - No evidence of common femoral vein obstruction.  *See table(s) above for measurements and observations. Electronically signed by Jamelle Haring on 06/14/2021 at 4:43:59 PM.    Final    US THORACENTESIS ASP PLEURAL SPACE W/IMG GUIDE  Result Date: 06/14/2021 Docia Barrier, [place]     06/14/2021  4:38 PM PROCEDURE SUMMARY: Successful US guided diagnostic and therapeutic right thoracentesis. Yielded 1.4 liters of bloody fluid. Pt tolerated procedure well. No immediate complications. Specimen was sent for labs. CXR ordered. EBL < 5 mL Docia Barrier PA-C 06/14/2021 4:38 PM       [place] [address] [place]  If 7PM-7AM, please contact night-coverage [email or web address] 06/15/2021, 11:11 AM

## 2021-06-15 NOTE — Progress Notes (Signed)
Pt O2 sats while resting on room air is 91%.  Pt sats at 89% on room air when ambulating.

## 2021-06-15 NOTE — Progress Notes (Signed)
3 Days Post-Op   Subjective/Chief Complaint: Sob improved comfortable   Objective: Vital signs in last 24 hours: Temp:  [98.5 F (36.9 C)-100.1 F (37.8 C)] 98.5 F (36.9 C) (07/23 0406) Pulse Rate:  [79-96] 83 (07/23 0406) Resp:  [16-18] 16 (07/23 0406) BP: (136-162)/(72-78) 144/72 (07/23 0406) SpO2:  [91 %-96 %] 94 % (07/23 0406) Last BM Date: 06/15/21  Intake/Output from previous day: 07/22 0701 - 07/23 0700 In: 120 [P.O.:120] Out: 535 [Urine:475; Drains:60] Intake/Output this shift: Total I/O In: 120 [P.O.:120] Out: -   Exam: Awake and alert Lungs clear Abdomen soft, minimally tender  Lab Results:  Recent Labs    06/14/21 0409 06/15/21 0349  WBC 7.9 9.1  HGB 12.3 12.6  HCT 39.8 39.5  PLT 290 289   BMET Recent Labs    06/14/21 0409 06/15/21 0349  NA 139 141  K 4.8 4.7  CL 106 101  CO2 27 32  GLUCOSE 109* 160*  BUN 19 16  CREATININE 0.95 0.78  CALCIUM 8.5* 8.8*   PT/INR No results for input(s): LABPROT, INR in the last 72 hours. ABG No results for input(s): PHART, HCO3 in the last 72 hours.  Invalid input(s): PCO2, PO2  Studies/Results: CT Angio Chest Pulmonary Embolism (PE) W or WO Contrast  Result Date: 06/14/2021 CLINICAL DATA:  87 year old with chest pain or shortness of breath, pleurisy or effusion suspected. EXAM: CT ANGIOGRAPHY CHEST WITH CONTRAST TECHNIQUE: Multidetector CT imaging of the chest was performed using the standard protocol during bolus administration of intravenous contrast. Multiplanar CT image reconstructions and MIPs were obtained to evaluate the vascular anatomy. CONTRAST:  62mL OMNIPAQUE IOHEXOL 350 MG/ML SOLN COMPARISON:  Chest radiograph 06/14/2021 FINDINGS: Cardiovascular: Main pulmonary arteries are patent without large filling defects. Evaluation of the pulmonary arteries beyond the lobar branches is limited. Cannot confidently evaluate the segmental and subsegmental branches. There is one segmental branch in the left  lower lobe on sequence 5, image 140 that is indeterminate. However favor artifact rather than true pulmonary embolism. No evidence for large or central pulmonary embolism. Atherosclerotic calcifications involving the coronary arteries and thoracic aorta. Ascending thoracic aorta is ectatic measuring at least 3.9 cm but there is significant pulsation artifact in this region. No significant pericardial effusion. Mediastinum/Nodes: Thyroid tissue is heterogeneous with a large right thyroid lobe. Prominent AP window lymph node on sequence 4 image 34 measuring 1.1 cm short axis. Limited evaluation for right hilar and subcarinal lymphadenopathy due to the right pleural fluid. Concern for soft tissue fullness in the subcarinal and right paratracheal region. No significant axillary lymph node enlargement. Lungs/Pleura: Large right pleural effusion with scattered areas of pleural thickening and nodularity. The large area pleural-based thickening in the medial right lower chest on sequence 4 image 75 that measures 4.6 x 1.8 cm. Significant volume loss in the right lower lobe. Extensive nodularity along the right lung fissures. There appears to be a small azygos lobe. No evidence for left pleural fluid. There is a suspicious pleural-based nodule in the left lower lobe on sequence 6, 97 measuring up to 6 mm. Additional nodule in the left upper lobe on image 60. Upper Abdomen: Low-density fullness involving the left adrenal gland probably represents an adenoma but technically indeterminate based on the Hounsfield units. Mild fullness in the right adrenal gland. No gross abnormality in the visualized liver. Musculoskeletal: Multilevel degenerative changes in the thoracic spine. No clear evidence for an osseous lesion. Degenerative changes in both shoulders. Marked sclerosis in left glenoid region  is likely degenerative rather than neoplastic. Review of the MIP images confirms the above findings. IMPRESSION: 1. Limited evaluation  for pulmonary embolism but no evidence for a large central pulmonary embolism. Limited evaluation beyond the lobar arteries. 2. Large right pleural effusion with extensive pleural based nodularity throughout the right lung and a small amount of nodularity in the left lung. Findings are highly concerning for a neoplastic process and metastatic disease. Suspicion for mediastinal lymphadenopathy but limited evaluation due to the adjacent pleural fluid. 3.  Aortic Atherosclerosis (ICD10-I70.0). These results were called by telephone at the time of interpretation on 06/14/2021 at 2:55 pm to provider Cherylann Ratel , who verbally acknowledged these results. Electronically Signed   By: Markus Daft M.D.   On: 06/14/2021 14:55   DG Chest Port 1 View  Result Date: 06/14/2021 CLINICAL DATA:  Status post right thoracentesis. EXAM: PORTABLE CHEST 1 VIEW COMPARISON:  Radiographs and CT earlier today FINDINGS: Decreased size of right pleural effusion post thoracentesis. Small residual pleural fluid persists. No pneumothorax. Cardiomegaly is similar. Unchanged mediastinal contours with aortic atherosclerosis. IMPRESSION: 1. Decreased size of right pleural effusion post thoracentesis. No pneumothorax. 2. Unchanged cardiomegaly. Electronically Signed   By: Keith Rake M.D.   On: 06/14/2021 17:07   DG CHEST PORT 1 VIEW  Result Date: 06/14/2021 CLINICAL DATA:  Shortness of breath. EXAM: PORTABLE CHEST 1 VIEW COMPARISON:  June 17, 2017. FINDINGS: Stable cardiomediastinal silhouette. No pneumothorax is noted. Left lung is clear. Mild right pleural effusion is noted with associated right basilar atelectasis or infiltrate. Bony thorax is unremarkable. IMPRESSION: Mild right pleural effusion is noted with associated right basilar atelectasis or infiltrate. Aortic Atherosclerosis (ICD10-I70.0). Electronically Signed   By: Marijo Conception M.D.   On: 06/14/2021 11:51   VAS [name] LOWER EXTREMITY VENOUS (DVT)  Result Date: 06/14/2021   Lower Venous DVT Study Patient Name:  BALJIT LIEBERT [facility]  Date of Exam:   06/14/2021 Medical Rec #: [id number]             Accession #:    [id number] Date of Birth: June 13, 1933             Patient Gender: F Patient Age:   89Y Exam Location:  [facility] Procedure:      VAS [name] LOWER EXTREMITY VENOUS (DVT) Referring Phys: [id number] ALICIA THOMAS --------------------------------------------------------------------------------  Indications: Swelling.  Risk Factors: None identified. Comparison Study: No prior studies. Performing Technologist: Oliver Hum RVT  Examination Guidelines: A complete evaluation includes B-mode imaging, spectral Doppler, color Doppler, and power Doppler as needed of all accessible portions of each vessel. Bilateral testing is considered an integral part of a complete examination. Limited examinations for reoccurring indications may be performed as noted. The reflux portion of the exam is performed with the patient in reverse Trendelenburg.  +---------+---------------+---------+-----------+----------+--------------+ RIGHT    CompressibilityPhasicitySpontaneityPropertiesThrombus Aging +---------+---------------+---------+-----------+----------+--------------+ CFV      Full           Yes      Yes                                 +---------+---------------+---------+-----------+----------+--------------+ SFJ      Full                                                        +---------+---------------+---------+-----------+----------+--------------+  FV Prox  Full                                                        +---------+---------------+---------+-----------+----------+--------------+ FV Mid   Full                                                        +---------+---------------+---------+-----------+----------+--------------+ FV DistalFull                                                         +---------+---------------+---------+-----------+----------+--------------+ PFV      Full                                                        +---------+---------------+---------+-----------+----------+--------------+ POP      Full           Yes      Yes                                 +---------+---------------+---------+-----------+----------+--------------+ PTV      Full                                                        +---------+---------------+---------+-----------+----------+--------------+ PERO     Full                                                        +---------+---------------+---------+-----------+----------+--------------+   +----+---------------+---------+-----------+----------+--------------+ LEFTCompressibilityPhasicitySpontaneityPropertiesThrombus Aging +----+---------------+---------+-----------+----------+--------------+ CFV Full           Yes      Yes                                 +----+---------------+---------+-----------+----------+--------------+     Summary: RIGHT: - There is no evidence of deep vein thrombosis in the lower extremity.  - No cystic structure found in the popliteal fossa.  LEFT: - No evidence of common femoral vein obstruction.  *See table(s) above for measurements and observations. Electronically signed by Jamelle Haring on 06/14/2021 at 4:43:59 PM.    Final    US THORACENTESIS ASP PLEURAL SPACE W/IMG GUIDE  Result Date: 06/14/2021 Docia Barrier, [place]     06/14/2021  4:38 PM PROCEDURE SUMMARY: Successful US guided diagnostic and therapeutic right thoracentesis. Yielded 1.4 liters of bloody fluid. Pt tolerated procedure well. No immediate complications. Specimen was sent for labs. CXR ordered.  EBL < 5 mL Docia Barrier PA-C 06/14/2021 4:38 PM    Anti-infectives: Anti-infectives (From admission, onward)    Start     Dose/Rate Route Frequency Ordered Stop   06/12/21 2100  cefoTEtan (CEFOTAN) 2 g in  sodium chloride 0.9 % 100 mL IVPB        2 g 200 mL/hr over 30 Minutes Intravenous Every 12 hours 06/12/21 1126 06/12/21 2052   06/12/21 0600  cefoTEtan (CEFOTAN) 2 g in sodium chloride 0.9 % 100 mL IVPB        2 g 200 mL/hr over 30 Minutes Intravenous On call to O.R. 06/11/21 [id number] 06/12/21 0840       Assessment/Plan: s/p Procedure(s): XI ROBOT ASSISTED RECTOPEXY (N/A)  Workup of pleural effusion, possible metastatic disease on going Appreciate hospitalist's and oncology's care. For CT scans today Potential discharge tomorrow if doing well  LOS: 3 days    Coralie Keens 06/15/2021

## 2021-06-16 DIAGNOSIS — R918 Other nonspecific abnormal finding of lung field: Secondary | ICD-10-CM | POA: Diagnosis not present

## 2021-06-16 DIAGNOSIS — R0603 Acute respiratory distress: Secondary | ICD-10-CM | POA: Diagnosis not present

## 2021-06-16 DIAGNOSIS — J90 Pleural effusion, not elsewhere classified: Secondary | ICD-10-CM | POA: Diagnosis not present

## 2021-06-16 DIAGNOSIS — Z85820 Personal history of malignant melanoma of skin: Secondary | ICD-10-CM | POA: Diagnosis not present

## 2021-06-16 MED ORDER — ALBUTEROL SULFATE HFA 108 (90 BASE) MCG/ACT IN AERS: 2.0000 | INHALATION_SPRAY | Freq: Four times a day (QID) | RESPIRATORY_TRACT | 2 refills | Status: DC | PRN

## 2021-06-16 MED ORDER — MOMETASONE FURO-FORMOTEROL FUM 200-5 MCG/ACT IN AERO
2.0000 | INHALATION_SPRAY | Freq: Two times a day (BID) | RESPIRATORY_TRACT | Status: DC
  Administered 2021-06-16 – 2021-06-17 (×3): 2 via RESPIRATORY_TRACT
  Filled 2021-06-16: qty 8.8

## 2021-06-16 MED ORDER — MOMETASONE FURO-FORMOTEROL FUM 200-5 MCG/ACT IN AERO: 2.0000 | INHALATION_SPRAY | Freq: Two times a day (BID) | RESPIRATORY_TRACT | 1 refills | Status: DC

## 2021-06-16 MED ORDER — IPRATROPIUM-ALBUTEROL 0.5-2.5 (3) MG/3ML IN SOLN: 3.0000 mL | RESPIRATORY_TRACT | Status: DC | PRN

## 2021-06-16 NOTE — Progress Notes (Addendum)
Patient walked in hall for 360 ft. O2 stayed 90-95.   Do not give O2 unless oxygen is less than 88% per Dr. Cyndia Skeeters.

## 2021-06-16 NOTE — Progress Notes (Signed)
4 Days Post-Op   Subjective/Chief Complaint: Comfortable this morning Denies SOB but still requiring oxygen by [place] Minimal abdominal pain   Objective: Vital signs in last 24 hours: Temp:  [98 F (36.7 C)-98.2 F (36.8 C)] 98.2 F (36.8 C) (07/24 0532) Pulse Rate:  [75-79] 75 (07/24 0532) Resp:  [18] 18 (07/24 0532) BP: (127-137)/(70-79) 127/70 (07/24 0532) SpO2:  [95 %-97 %] 95 % (07/24 0532) Last BM Date: 06/15/21  Intake/Output from previous day: 07/23 0701 - 07/24 0700 In: 340 [P.O.:340] Out: 34 [Urine:2; Drains:30; Stool:2] Intake/Output this shift: No intake/output data recorded.  Exam: Awake and alert Resp non labored Abdomen soft, minimally tender  Lab Results:  Recent Labs    06/14/21 0409 06/15/21 0349  WBC 7.9 9.1  HGB 12.3 12.6  HCT 39.8 39.5  PLT 290 289   BMET Recent Labs    06/14/21 0409 06/15/21 0349  NA 139 141  K 4.8 4.7  CL 106 101  CO2 27 32  GLUCOSE 109* 160*  BUN 19 16  CREATININE 0.95 0.78  CALCIUM 8.5* 8.8*   PT/INR No results for input(s): LABPROT, INR in the last 72 hours. ABG No results for input(s): PHART, HCO3 in the last 72 hours.  Invalid input(s): PCO2, PO2  Studies/Results: CT HEAD W & WO CONTRAST  Result Date: 06/15/2021 CLINICAL DATA:  Cancer of unknown primary, staging. EXAM: CT HEAD WITHOUT AND WITH CONTRAST TECHNIQUE: Contiguous axial images were obtained from the base of the skull through the vertex without and with intravenous contrast CONTRAST:  62mL OMNIPAQUE IOHEXOL 350 MG/ML SOLN COMPARISON:  None. FINDINGS: Brain: There is no evidence of an acute infarct, intracranial hemorrhage, mass, midline shift, or extra-axial fluid collection. The ventricles and sulci are within normal limits for age. Hypodensities in the cerebral white matter bilaterally are nonspecific but compatible with mild chronic small vessel ischemic disease. No abnormal intracranial enhancement suggestive of metastatic disease is seen. A small  developmental venous anomaly is incidentally noted in the right parietal lobe. Vascular: Calcified atherosclerosis at the skull base. Major dural venous sinuses and large arteries at the base of the brain are grossly patent. Skull: Well-defined 2 cm lucency scalloping the inner table of the left parietal skull with internal fluid density, benign in appearance. Sinuses/Orbits: Visualized paranasal sinuses and mastoid air cells are clear. Unremarkable orbits. Other: None. IMPRESSION: 1. No evidence of intracranial metastases. 2. Mild chronic small vessel ischemic disease. Electronically Signed   By: Logan Bores M.D.   On: 06/15/2021 14:23   CT Angio Chest Pulmonary Embolism (PE) W or WO Contrast  Result Date: 06/14/2021 CLINICAL DATA:  87 year old with chest pain or shortness of breath, pleurisy or effusion suspected. EXAM: CT ANGIOGRAPHY CHEST WITH CONTRAST TECHNIQUE: Multidetector CT imaging of the chest was performed using the standard protocol during bolus administration of intravenous contrast. Multiplanar CT image reconstructions and MIPs were obtained to evaluate the vascular anatomy. CONTRAST:  47mL OMNIPAQUE IOHEXOL 350 MG/ML SOLN COMPARISON:  Chest radiograph 06/14/2021 FINDINGS: Cardiovascular: Main pulmonary arteries are patent without large filling defects. Evaluation of the pulmonary arteries beyond the lobar branches is limited. Cannot confidently evaluate the segmental and subsegmental branches. There is one segmental branch in the left lower lobe on sequence 5, image 140 that is indeterminate. However favor artifact rather than true pulmonary embolism. No evidence for large or central pulmonary embolism. Atherosclerotic calcifications involving the coronary arteries and thoracic aorta. Ascending thoracic aorta is ectatic measuring at least 3.9 cm but there is  significant pulsation artifact in this region. No significant pericardial effusion. Mediastinum/Nodes: Thyroid tissue is heterogeneous with  a large right thyroid lobe. Prominent AP window lymph node on sequence 4 image 34 measuring 1.1 cm short axis. Limited evaluation for right hilar and subcarinal lymphadenopathy due to the right pleural fluid. Concern for soft tissue fullness in the subcarinal and right paratracheal region. No significant axillary lymph node enlargement. Lungs/Pleura: Large right pleural effusion with scattered areas of pleural thickening and nodularity. The large area pleural-based thickening in the medial right lower chest on sequence 4 image 75 that measures 4.6 x 1.8 cm. Significant volume loss in the right lower lobe. Extensive nodularity along the right lung fissures. There appears to be a small azygos lobe. No evidence for left pleural fluid. There is a suspicious pleural-based nodule in the left lower lobe on sequence 6, 97 measuring up to 6 mm. Additional nodule in the left upper lobe on image 60. Upper Abdomen: Low-density fullness involving the left adrenal gland probably represents an adenoma but technically indeterminate based on the Hounsfield units. Mild fullness in the right adrenal gland. No gross abnormality in the visualized liver. Musculoskeletal: Multilevel degenerative changes in the thoracic spine. No clear evidence for an osseous lesion. Degenerative changes in both shoulders. Marked sclerosis in left glenoid region is likely degenerative rather than neoplastic. Review of the MIP images confirms the above findings. IMPRESSION: 1. Limited evaluation for pulmonary embolism but no evidence for a large central pulmonary embolism. Limited evaluation beyond the lobar arteries. 2. Large right pleural effusion with extensive pleural based nodularity throughout the right lung and a small amount of nodularity in the left lung. Findings are highly concerning for a neoplastic process and metastatic disease. Suspicion for mediastinal lymphadenopathy but limited evaluation due to the adjacent pleural fluid. 3.  Aortic  Atherosclerosis (ICD10-I70.0). These results were called by telephone at the time of interpretation on 06/14/2021 at 2:55 pm to provider Cherylann Ratel , who verbally acknowledged these results. Electronically Signed   By: Markus Daft M.D.   On: 06/14/2021 14:55   CT ABDOMEN PELVIS W CONTRAST  Result Date: 06/15/2021 CLINICAL DATA:  Cancer of unknown primary. EXAM: CT ABDOMEN AND PELVIS WITH CONTRAST TECHNIQUE: Multidetector CT imaging of the abdomen and pelvis was performed using the standard protocol following bolus administration of intravenous contrast. CONTRAST:  31mL OMNIPAQUE IOHEXOL 350 MG/ML SOLN COMPARISON:  June 14, 2021.  Mar 25, 2021. FINDINGS: Lower chest: Small right pleural effusion is noted with associated atelectasis. Multiple pleural based nodules are noted in the right hemothorax consistent with metastatic disease. Hepatobiliary: Cholelithiasis is noted. No biliary dilatation is noted. The liver is unremarkable. Pancreas: Unremarkable. No pancreatic ductal dilatation or surrounding inflammatory changes. Spleen: Normal in size without focal abnormality. Adrenals/Urinary Tract: Adrenal glands are unremarkable. Kidneys are normal, without renal calculi, focal lesion, or hydronephrosis. Bladder is unremarkable. Stomach/Bowel: The stomach appears normal. There is no evidence of bowel obstruction. Stool is noted throughout the colon. Patient is reportedly status post robotic assisted rectopexy. Mild stranding is noted around the rectum consistent with postsurgical status. Surgical drain is seen entering the right upper quadrant of the abdomen with distal tip extending to the right perirectal region. Vascular/Lymphatic: Aortic atherosclerosis. No enlarged abdominal or pelvic lymph nodes. Reproductive: Status post hysterectomy. No adnexal masses. Other: Small amount of gas is seen within the subcutaneous tissues anteriorly in the pelvis consistent with recent surgery. No abnormal fluid collection is noted.  No definite hernia is noted. Musculoskeletal:  No acute or significant osseous findings. IMPRESSION: Patient is reportedly status post interval robotic assisted rectopexy. Surgical drain is noted extending from right upper quadrant of the abdomen with distal tip in the right perirectal region. Expected postsurgical changes are noted around the rectum. Cholelithiasis is noted. Small right pleural effusion is again noted with associated atelectasis. Multiple pleural based nodules are noted in the visualized right hemithorax consistent with metastatic disease. Aortic Atherosclerosis (ICD10-I70.0). Electronically Signed   By: Marijo Conception M.D.   On: 06/15/2021 14:25   DG Chest Port 1 View  Result Date: 06/14/2021 CLINICAL DATA:  Status post right thoracentesis. EXAM: PORTABLE CHEST 1 VIEW COMPARISON:  Radiographs and CT earlier today FINDINGS: Decreased size of right pleural effusion post thoracentesis. Small residual pleural fluid persists. No pneumothorax. Cardiomegaly is similar. Unchanged mediastinal contours with aortic atherosclerosis. IMPRESSION: 1. Decreased size of right pleural effusion post thoracentesis. No pneumothorax. 2. Unchanged cardiomegaly. Electronically Signed   By: Keith Rake M.D.   On: 06/14/2021 17:07   DG CHEST PORT 1 VIEW  Result Date: 06/14/2021 CLINICAL DATA:  Shortness of breath. EXAM: PORTABLE CHEST 1 VIEW COMPARISON:  June 17, 2017. FINDINGS: Stable cardiomediastinal silhouette. No pneumothorax is noted. Left lung is clear. Mild right pleural effusion is noted with associated right basilar atelectasis or infiltrate. Bony thorax is unremarkable. IMPRESSION: Mild right pleural effusion is noted with associated right basilar atelectasis or infiltrate. Aortic Atherosclerosis (ICD10-I70.0). Electronically Signed   By: Marijo Conception M.D.   On: 06/14/2021 11:51   VAS [name] LOWER EXTREMITY VENOUS (DVT)  Result Date: 06/14/2021  Lower Venous DVT Study Patient Name:  ALAZIA CROCKET [facility]  Date of Exam:   06/14/2021 Medical Rec #: [id number]             Accession #:    [id number] Date of Birth: 08-09-1933             Patient Gender: F Patient Age:   53Y Exam Location:  [facility] Procedure:      VAS [name] LOWER EXTREMITY VENOUS (DVT) Referring Phys: [id number] ALICIA THOMAS --------------------------------------------------------------------------------  Indications: Swelling.  Risk Factors: None identified. Comparison Study: No prior studies. Performing Technologist: Oliver Hum RVT  Examination Guidelines: A complete evaluation includes B-mode imaging, spectral Doppler, color Doppler, and power Doppler as needed of all accessible portions of each vessel. Bilateral testing is considered an integral part of a complete examination. Limited examinations for reoccurring indications may be performed as noted. The reflux portion of the exam is performed with the patient in reverse Trendelenburg.  +---------+---------------+---------+-----------+----------+--------------+ RIGHT    CompressibilityPhasicitySpontaneityPropertiesThrombus Aging +---------+---------------+---------+-----------+----------+--------------+ CFV      Full           Yes      Yes                                 +---------+---------------+---------+-----------+----------+--------------+ SFJ      Full                                                        +---------+---------------+---------+-----------+----------+--------------+ FV Prox  Full                                                        +---------+---------------+---------+-----------+----------+--------------+  FV Mid   Full                                                        +---------+---------------+---------+-----------+----------+--------------+ FV DistalFull                                                        +---------+---------------+---------+-----------+----------+--------------+ PFV      Full                                                         +---------+---------------+---------+-----------+----------+--------------+ POP      Full           Yes      Yes                                 +---------+---------------+---------+-----------+----------+--------------+ PTV      Full                                                        +---------+---------------+---------+-----------+----------+--------------+ PERO     Full                                                        +---------+---------------+---------+-----------+----------+--------------+   +----+---------------+---------+-----------+----------+--------------+ LEFTCompressibilityPhasicitySpontaneityPropertiesThrombus Aging +----+---------------+---------+-----------+----------+--------------+ CFV Full           Yes      Yes                                 +----+---------------+---------+-----------+----------+--------------+     Summary: RIGHT: - There is no evidence of deep vein thrombosis in the lower extremity.  - No cystic structure found in the popliteal fossa.  LEFT: - No evidence of common femoral vein obstruction.  *See table(s) above for measurements and observations. Electronically signed by Jamelle Haring on 06/14/2021 at 4:43:59 PM.    Final    US THORACENTESIS ASP PLEURAL SPACE W/IMG GUIDE  Result Date: 06/14/2021 Docia Barrier, [place]     06/14/2021  4:38 PM PROCEDURE SUMMARY: Successful US guided diagnostic and therapeutic right thoracentesis. Yielded 1.4 liters of bloody fluid. Pt tolerated procedure well. No immediate complications. Specimen was sent for labs. CXR ordered. EBL < 5 mL Docia Barrier PA-C 06/14/2021 4:38 PM    Anti-infectives: Anti-infectives (From admission, onward)    Start     Dose/Rate Route Frequency Ordered Stop   06/12/21 2100  cefoTEtan (CEFOTAN) 2 g in sodium chloride 0.9 % 100 mL IVPB        2 g 200 mL/hr  over 30 Minutes Intravenous Every 12 hours  06/12/21 1126 06/12/21 2052   06/12/21 0600  cefoTEtan (CEFOTAN) 2 g in sodium chloride 0.9 % 100 mL IVPB        2 g 200 mL/hr over 30 Minutes Intravenous On call to O.R. 06/11/21 8638 06/12/21 0840       Assessment/Plan: s/p Procedure(s): XI ROBOT ASSISTED RECTOPEXY (N/A)  CT head, abdomin and pelvis negative for metastatic disease. Still with right pleural effusion and atelectasis  Trying to ween O2 off to see if she can go without home oxygen and continue workup as outpt.  Will repeat a CXR in the morning if unable to discharge  LOS: 4 days    Coralie Keens 06/16/2021

## 2021-06-16 NOTE — Progress Notes (Signed)
PROGRESS NOTE  Peggy Howard OAC:[id number] DOB: [birth year]   PCP: Lorrene Reid, PA-C  Patient is from: Home.   DOA: 06/12/2021 LOS: 4  Chief complaints:  No chief complaint on file.    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  Hospitalist service consulted for respiratory distress with shortness of breath, cough and wheeze.  She had CTA chest that showed large right pleural effusion and diffuse pulmonary nodules concerning for malignancy.  She underwent thoracocentesis with removal of 1.4 L of exudative fluid with improvement in her symptoms.  Oncology consulted and following.  CT head, abdomen and pelvis with no significant finding other than what was seen on CT chest.  Patient was started on [facility] and as needed albuterol.  Pleural fluid cytology, fungal culture and pH pending.   Subjective: Seen and examined earlier this morning.  No major events overnight of this morning.  Still with some shortness of breath and cough but improved.  She denies chest pain.  No nausea or vomiting.  Objective: Vitals:   06/14/21 2149 06/15/21 0406 06/15/21 1301 06/16/21 0532  BP: (!) 149/77 (!) 144/72 137/79 127/70  Pulse: 96 83 79 75  Resp: 18 16 18 18   Temp: 100.1 F (37.8 C) 98.5 F (36.9 C) 98 F (36.7 C) 98.2 F (36.8 C)  TempSrc: Oral Oral Oral Oral  SpO2: 91% 94% 97% 95%  Weight:      Height:        Intake/Output Summary (Last 24 hours) at 06/16/2021 0827 Last data filed at 06/16/2021 0600 Gross per 24 hour  Intake 340 ml  Output 34 ml  Net 306 ml   Filed Weights   06/12/21 0659 06/13/21 0500 06/14/21 0500  Weight: 73.5 kg 80.4 kg 80.4 kg    Examination:  GENERAL: No apparent distress.  Nontoxic. HEENT: MMM.  Vision and hearing grossly  intact.  NECK: Supple.  No apparent JVD.  RESP: 95% on 2 L.  No IWOB.  Some wheeze, cough and rhonchi CVS:  RRR. Heart sounds normal.  ABD/GI/GU: BS+. Abd soft.  Slightly tender.  Drain over RUQ. MSK/EXT:  Moves extremities. No apparent deformity. No edema.  SKIN: no apparent skin lesion or wound NEURO: Awake and alert. Oriented appropriately.  No apparent focal neuro deficit. PSYCH: Calm. Normal affect.   Procedures:  7/20-robotic rectopexy for rectal prolapse 7/22-right thoracocentesis with removal of 1.4 exudative fluid  Microbiology summarized: Pleural fluid culture NGTD. Assessment & Plan: Respiratory distress due to right pleural effusion-exudative.  CTA chest negative for PE but  large right pleural effusion, pleural-based nodularity and mediastinal LAD concerning for malignancy.  Breathing improved with thoracocentesis.  Pleural fluid culture NGTD.  BNP within normal.  She has no documented desaturation although she is started on supplemental oxygen. -Wean oxygen and assess ambulatory saturation -Incentive spirometry -Dulera and as needed albuterol-printed Rx for home use given his smoking history -Follow pleural pH, fungal culture and cytology.  Lung nodules/large pleural effusion-concern about malignancy.  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  CT abdomen and pelvis without significant finding other than what is noted on CT chest. -Follow pleural pH, fungal culture and cytology -Oncology following.  History of rectal prolapse s/p robotic rectopexy on 7/20 -  Per general surgery.  Class I obesity Body mass index is 30.42 kg/m.         DVT prophylaxis:  enoxaparin (LOVENOX) injection 40 mg Start: 06/13/21 0800 SCD's Start: 06/12/21 1127  Code Status: Full code Family Communication: Patient and/or RN. Available if any question.  Level of care: Med-Surg Status is: Inpatient   Dispo: Per primary.  She is a stable for  discharge from medical standpoint after ambulatory saturation assessment.  Rx for [facility] and albuterol printed.    Sch Meds:  Scheduled Meds:  bisacodyl  5 mg Oral BID   enoxaparin (LOVENOX) injection  40 mg Subcutaneous Q24H   feeding supplement  237 mL Oral BID BM   mometasone-formoterol  2 puff Inhalation BID   psyllium  1 packet Oral BID   saccharomyces boulardii  250 mg Oral BID   Continuous Infusions: PRN Meds:.acetaminophen, alum & mag hydroxide-simeth, HYDROmorphone (DILAUDID) injection, ipratropium-albuterol, ondansetron **OR** ondansetron (ZOFRAN) IV, simethicone, traMADol  Antimicrobials: Anti-infectives (From admission, onward)    Start     Dose/Rate Route Frequency Ordered Stop   06/12/21 2100  cefoTEtan (CEFOTAN) 2 g in sodium chloride 0.9 % 100 mL IVPB        2 g 200 mL/hr over 30 Minutes Intravenous Every 12 hours 06/12/21 1126 06/12/21 2052   06/12/21 0600  cefoTEtan (CEFOTAN) 2 g in sodium chloride 0.9 % 100 mL IVPB        2 g 200 mL/hr over 30 Minutes Intravenous On call to O.R. 06/11/21 9935 06/12/21 0840        I have personally reviewed the following labs and images: CBC: Recent Labs  Lab 06/13/21 0421 06/14/21 0409 06/15/21 0349  WBC 8.4 7.9 9.1  HGB 11.7* 12.3 12.6  HCT 37.2 39.8 39.5  MCV 99.2 99.3 97.8  PLT 277 290 289   BMP &GFR Recent Labs  Lab 06/13/21 0421 06/14/21 0409 06/15/21 0349  NA 140 139 141  K 4.3 4.8 4.7  CL 105 106 101  CO2 27 27 32  GLUCOSE 109* 109* 160*  BUN 13 19 16   CREATININE 0.74 0.95 0.78  CALCIUM 8.5* 8.5* 8.8*   Estimated Creatinine Clearance: 52.8 mL/min (by C-G formula based on SCr of 0.78 mg/dL). Liver & Pancreas: Recent Labs  Lab 06/15/21 0349  AST 17  ALT 15  ALKPHOS 60  BILITOT 0.9  PROT 6.2*  ALBUMIN 3.1*   No results for input(s): LIPASE, AMYLASE in the last 168 hours. No results for input(s): AMMONIA in the last 168 hours. Diabetic: No results for input(s): HGBA1C in the last 72  hours. No results for input(s): GLUCAP in the last 168 hours. Cardiac Enzymes: No results for input(s): CKTOTAL, CKMB, CKMBINDEX, TROPONINI in the last 168 hours. No results for input(s): PROBNP in the last 8760 hours. Coagulation Profile: No results for input(s): INR, PROTIME in the last 168 hours. Thyroid Function Tests: No results for input(s): TSH, T4TOTAL, FREET4, T3FREE, THYROIDAB in the last 72 hours. Lipid Profile: No results for input(s): CHOL, HDL, LDLCALC, TRIG, CHOLHDL, LDLDIRECT in the last 72 hours. Anemia Panel: No results for input(s): VITAMINB12, FOLATE, FERRITIN, TIBC, IRON, RETICCTPCT in the last 72 hours. Urine analysis:    Component Value Date/Time   COLORURINE YELLOW 03/25/2021 1537   APPEARANCEUR HAZY (A) 03/25/2021 1537   LABSPEC 1.027 03/25/2021 1537   PHURINE 6.0 03/25/2021 1537   GLUCOSEU NEGATIVE 03/25/2021 1537   HGBUR MODERATE (A) 03/25/2021 1537   BILIRUBINUR NEGATIVE 03/25/2021 1537  KETONESUR NEGATIVE 03/25/2021 1537   PROTEINUR NEGATIVE 03/25/2021 1537   NITRITE NEGATIVE 03/25/2021 1537   LEUKOCYTESUR NEGATIVE 03/25/2021 1537   Sepsis Labs: Invalid input(s): PROCALCITONIN, [place]  Microbiology: Recent Results (from the past 240 hour(s))  SARS CORONAVIRUS 2 (TAT 6-24 HRS) Nasopharyngeal Nasopharyngeal Swab     Status: None   Collection Time: 06/10/21 11:59 AM   Specimen: Nasopharyngeal Swab  Result Value Ref Range Status   SARS Coronavirus 2 NEGATIVE NEGATIVE Final    Comment: (NOTE) SARS-CoV-2 target nucleic acids are NOT DETECTED.  The SARS-CoV-2 RNA is generally detectable in upper and lower respiratory specimens during the acute phase of infection. Negative results do not preclude SARS-CoV-2 infection, do not rule out co-infections with other pathogens, and should not be used as the sole basis for treatment or other patient management decisions. Negative results must be combined with clinical observations, patient history, and  epidemiological information. The expected result is Negative.  Fact Sheet for Patients: SugarRoll.be  Fact Sheet for Healthcare Providers: https://www.woods-mathews.com/  This test is not yet approved or cleared by the [place] FDA and  has been authorized for detection and/or diagnosis of SARS-CoV-2 by FDA under an Emergency Use Authorization (EUA). This EUA will remain  in effect (meaning this test can be used) for the duration of the COVID-19 declaration under Se ction 564(b)(1) of the Act, 21 U.S.C. section 360bbb-3(b)(1), unless the authorization is terminated or revoked sooner.  Performed at [facility], [place] [address], [place], [place] [address]   Body fluid culture w Gram Stain     Status: None (Preliminary result)   Collection Time: 06/14/21  4:41 PM   Specimen: PATH Cytology Pleural fluid  Result Value Ref Range Status   Specimen Description   Final    PLEURAL Performed at [facility] [address], [place], [place] [address]    Special Requests   Final    NONE Performed at [facility], [place] [address] [place], [place] [address]    Gram Stain   Final    RARE WBC PRESENT,BOTH PMN AND MONONUCLEAR NO ORGANISMS SEEN    Culture   Final    NO GROWTH < 12 HOURS Performed at [facility], [place] [address], [place],  [address]    Report Status PENDING  Incomplete    Radiology Studies: CT HEAD W & WO CONTRAST  Result Date: 06/15/2021 CLINICAL DATA:  Cancer of unknown primary, staging. EXAM: CT HEAD WITHOUT AND WITH CONTRAST TECHNIQUE: Contiguous axial images were obtained from the base of the skull through the vertex without and with intravenous contrast CONTRAST:  33mL OMNIPAQUE IOHEXOL 350 MG/ML SOLN COMPARISON:  None. FINDINGS: Brain: There is no evidence of an acute infarct, intracranial hemorrhage, mass, midline shift, or extra-axial fluid collection. The  ventricles and sulci are within normal limits for age. Hypodensities in the cerebral white matter bilaterally are nonspecific but compatible with mild chronic small vessel ischemic disease. No abnormal intracranial enhancement suggestive of metastatic disease is seen. A small developmental venous anomaly is incidentally noted in the right parietal lobe. Vascular: Calcified atherosclerosis at the skull base. Major dural venous sinuses and large arteries at the base of the brain are grossly patent. Skull: Well-defined 2 cm lucency scalloping the inner table of the left parietal skull with internal fluid density, benign in appearance. Sinuses/Orbits: Visualized paranasal sinuses and mastoid air cells are clear. Unremarkable orbits. Other: None. IMPRESSION: 1. No evidence of intracranial metastases.  2. Mild chronic small vessel ischemic disease. Electronically Signed   By: Logan Bores M.D.   On: 06/15/2021 14:23   CT ABDOMEN PELVIS W CONTRAST  Result Date: 06/15/2021 CLINICAL DATA:  Cancer of unknown primary. EXAM: CT ABDOMEN AND PELVIS WITH CONTRAST TECHNIQUE: Multidetector CT imaging of the abdomen and pelvis was performed using the standard protocol following bolus administration of intravenous contrast. CONTRAST:  73mL OMNIPAQUE IOHEXOL 350 MG/ML SOLN COMPARISON:  June 14, 2021.  Mar 25, 2021. FINDINGS: Lower chest: Small right pleural effusion is noted with associated atelectasis. Multiple pleural based nodules are noted in the right hemothorax consistent with metastatic disease. Hepatobiliary: Cholelithiasis is noted. No biliary dilatation is noted. The liver is unremarkable. Pancreas: Unremarkable. No pancreatic ductal dilatation or surrounding inflammatory changes. Spleen: Normal in size without focal abnormality. Adrenals/Urinary Tract: Adrenal glands are unremarkable. Kidneys are normal, without renal calculi, focal lesion, or hydronephrosis. Bladder is unremarkable. Stomach/Bowel: The stomach appears  normal. There is no evidence of bowel obstruction. Stool is noted throughout the colon. Patient is reportedly status post robotic assisted rectopexy. Mild stranding is noted around the rectum consistent with postsurgical status. Surgical drain is seen entering the right upper quadrant of the abdomen with distal tip extending to the right perirectal region. Vascular/Lymphatic: Aortic atherosclerosis. No enlarged abdominal or pelvic lymph nodes. Reproductive: Status post hysterectomy. No adnexal masses. Other: Small amount of gas is seen within the subcutaneous tissues anteriorly in the pelvis consistent with recent surgery. No abnormal fluid collection is noted. No definite hernia is noted. Musculoskeletal: No acute or significant osseous findings. IMPRESSION: Patient is reportedly status post interval robotic assisted rectopexy. Surgical drain is noted extending from right upper quadrant of the abdomen with distal tip in the right perirectal region. Expected postsurgical changes are noted around the rectum. Cholelithiasis is noted. Small right pleural effusion is again noted with associated atelectasis. Multiple pleural based nodules are noted in the visualized right hemithorax consistent with metastatic disease. Aortic Atherosclerosis (ICD10-I70.0). Electronically Signed   By: Marijo Conception M.D.   On: 06/15/2021 14:25       Akili Cuda T. Price  If 7PM-7AM, please contact night-coverage [email or web address] 06/16/2021, 8:27 AM

## 2021-06-17 ENCOUNTER — Other Ambulatory Visit: Payer: Self-pay | Admitting: Student

## 2021-06-17 ENCOUNTER — Inpatient Hospital Stay (HOSPITAL_COMMUNITY): Payer: Medicare Other

## 2021-06-17 ENCOUNTER — Telehealth: Payer: Self-pay | Admitting: *Deleted

## 2021-06-17 DIAGNOSIS — R918 Other nonspecific abnormal finding of lung field: Secondary | ICD-10-CM | POA: Diagnosis not present

## 2021-06-17 DIAGNOSIS — Z85820 Personal history of malignant melanoma of skin: Secondary | ICD-10-CM | POA: Diagnosis not present

## 2021-06-17 DIAGNOSIS — R0603 Acute respiratory distress: Secondary | ICD-10-CM | POA: Diagnosis not present

## 2021-06-17 DIAGNOSIS — J90 Pleural effusion, not elsewhere classified: Secondary | ICD-10-CM | POA: Diagnosis not present

## 2021-06-17 DIAGNOSIS — R062 Wheezing: Secondary | ICD-10-CM

## 2021-06-17 DIAGNOSIS — Z87891 Personal history of nicotine dependence: Secondary | ICD-10-CM | POA: Diagnosis not present

## 2021-06-17 LAB — PH, BODY FLUID

## 2021-06-17 LAB — BODY FLUID CULTURE W GRAM STAIN: Culture: NO GROWTH

## 2021-06-17 LAB — FLOW CYTOMETRY REQUEST - FLUID (INPATIENT)

## 2021-06-17 IMAGING — DX DG CHEST 1V PORT
1 series · 1 of 1 positions shown · non-contrast
Comparison: [DATE]

CLINICAL DATA: Pleural effusion on right

EXAM:
PORTABLE CHEST 1 VIEW

[chest ap]
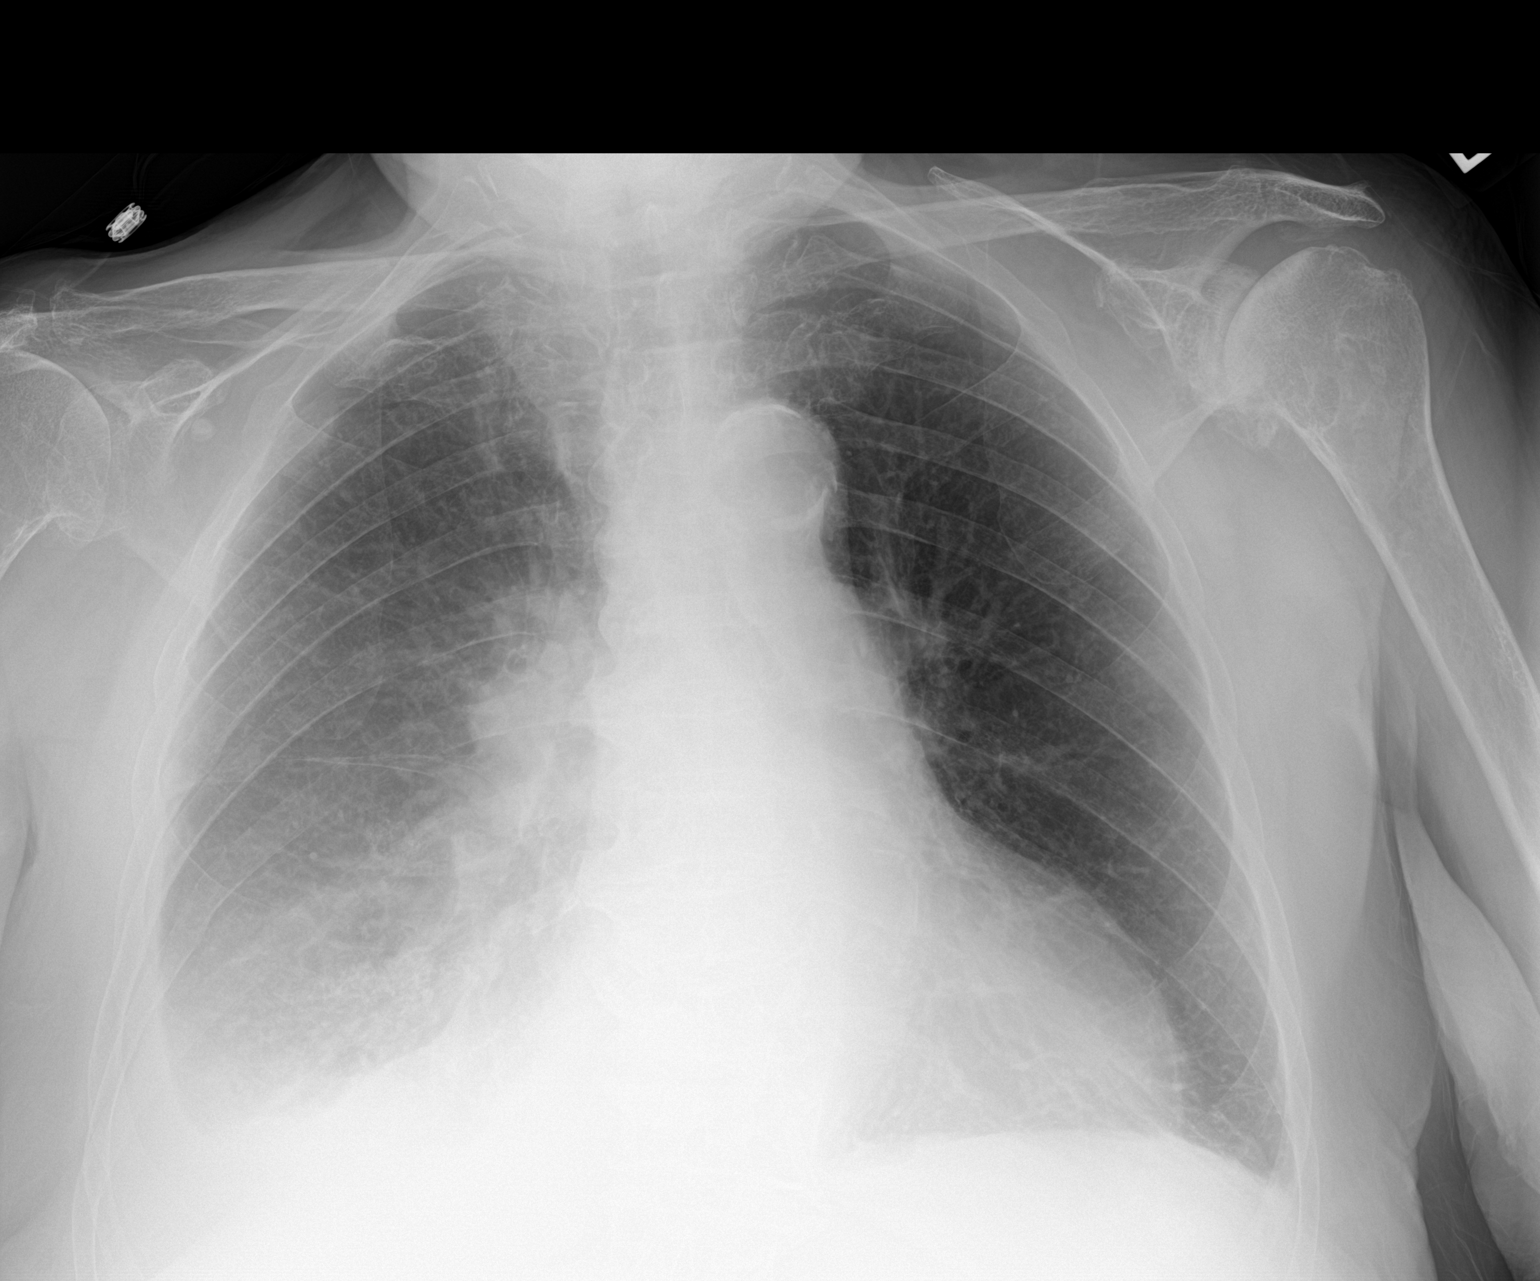

[1 of 1 positions shown; findings below may reference images not displayed]

FINDINGS: Stable to slightly increased right pleural effusion allowing for
differences in technique. Right basilar atelectasis. Right perihilar
adenopathy/mass.
IMPRESSION: Stable to slightly increased right pleural effusion. Right basilar
atelectasis.

Right perihilar adenopathy or mass.

## 2021-06-17 MED ORDER — PREDNISONE 50 MG PO TABS: 50.0000 mg | ORAL_TABLET | Freq: Every day | ORAL | 0 refills | Status: DC

## 2021-06-17 MED ORDER — FLUTICASONE-SALMETEROL 250-50 MCG/ACT IN AEPB: 1.0000 | INHALATION_SPRAY | Freq: Two times a day (BID) | RESPIRATORY_TRACT | 1 refills | Status: DC

## 2021-06-17 MED ORDER — METAMUCIL SMOOTH TEXTURE 58.6 % PO POWD: 1.0000 | Freq: Two times a day (BID) | ORAL | Status: DC

## 2021-06-17 MED ORDER — METHYLPREDNISOLONE SODIUM SUCC 125 MG IJ SOLR
125.0000 mg | Freq: Once | INTRAMUSCULAR | Status: AC
  Administered 2021-06-17: 125 mg via INTRAVENOUS
  Filled 2021-06-17: qty 2

## 2021-06-17 NOTE — Discharge Instructions (Signed)
SURGERY: POST OP INSTRUCTIONS (Surgery for small bowel obstruction, colon resection, etc)   ######################################################################  EAT Gradually transition to a high fiber diet with a fiber supplement over the next few days after discharge  WALK Walk an hour a day.  Control your pain to do that.    CONTROL PAIN Control pain so that you can walk, sleep, tolerate sneezing/coughing, go up/down stairs.  HAVE A BOWEL MOVEMENT DAILY Keep your bowels regular to avoid problems.  OK to try a laxative to override constipation.  OK to use an antidairrheal to slow down diarrhea.  Call if not better after 2 tries  CALL IF YOU HAVE PROBLEMS/CONCERNS Call if you are still struggling despite following these instructions. Call if you have concerns not answered by these instructions  ######################################################################   DIET Follow a light diet the first few days at home.  Start with a bland diet such as soups, liquids, starchy foods, low fat foods, etc.  If you feel full, bloated, or constipated, stay on a ful liquid or pureed/blenderized diet for a few days until you feel better and no longer constipated. Be sure to drink plenty of fluids every day to avoid getting dehydrated (feeling dizzy, not urinating, etc.). Gradually add a fiber supplement to your diet over the next week.  Gradually get back to a regular solid diet.  Avoid fast food or heavy meals the first week as you are more likely to get nauseated. It is expected for your digestive tract to need a few months to get back to normal.  It is common for your bowel movements and stools to be irregular.  You will have occasional bloating and cramping that should eventually fade away.  Until you are eating solid food normally, off all pain medications, and back to regular activities; your bowels will not be normal. Focus on eating a low-fat, high fiber diet the rest of your life  (See Getting to Good Bowel Health, below).  CARE of your INCISION or WOUND  It is good for closed incisions and even open wounds to be washed every day.  Shower every day.  Short baths are fine.  Wash the incisions and wounds clean with soap & water.    You may leave closed incisions open to air if it is dry.   You may cover the incision with clean gauze & replace it after your daily shower for comfort.  STAPLES: You have skin staples.  Leave them in place & set up an appointment for them to be removed by a surgery office nurse ~10 days after surgery. = 1st week of January 2024    ACTIVITIES as tolerated Start light daily activities --- self-care, walking, climbing stairs-- beginning the day after surgery.  Gradually increase activities as tolerated.  Control your pain to be active.  Stop when you are tired.  Ideally, walk several times a day, eventually an hour a day.   Most people are back to most day-to-day activities in a few weeks.  It takes 4-8 weeks to get back to unrestricted, intense activity. If you can walk 30 minutes without difficulty, it is safe to try more intense activity such as jogging, treadmill, bicycling, low-impact aerobics, swimming, etc. Save the most intensive and strenuous activity for last (Usually 4-8 weeks after surgery) such as sit-ups, heavy lifting, contact sports, etc.  Refrain from any intense heavy lifting or straining until you are off narcotics for pain control.  You will have off days, but things should improve   week-by-week. DO NOT PUSH THROUGH PAIN.  Let pain be your guide: If it hurts to do something, don't do it.  Pain is your body warning you to avoid that activity for another week until the pain goes down. You may drive when you are no longer taking narcotic prescription pain medication, you can comfortably wear a seatbelt, and you can safely make sudden turns/stops to protect yourself without hesitating due to pain. You may have sexual intercourse when it  is comfortable. If it hurts to do something, stop.  MEDICATIONS Take your usually prescribed home medications unless otherwise directed.   Blood thinners:  Usually you can restart any strong blood thinners after the second postoperative day.  It is OK to take aspirin right away.     If you are on strong blood thinners (warfarin/Coumadin, Plavix, Xerelto, Eliquis, Pradaxa, etc), discuss with your surgeon, medicine PCP, and/or cardiologist for instructions on when to restart the blood thinner & if blood monitoring is needed (PT/INR blood check, etc).     PAIN CONTROL Pain after surgery or related to activity is often due to strain/injury to muscle, tendon, nerves and/or incisions.  This pain is usually short-term and will improve in a few months.  To help speed the process of healing and to get back to regular activity more quickly, DO THE FOLLOWING THINGS TOGETHER: Increase activity gradually.  DO NOT PUSH THROUGH PAIN Use Ice and/or Heat Try Gentle Massage and/or Stretching Take over the counter pain medication Take Narcotic prescription pain medication for more severe pain  Good pain control = faster recovery.  It is better to take more medicine to be more active than to stay in bed all day to avoid medications.  Increase activity gradually Avoid heavy lifting at first, then increase to lifting as tolerated over the next 6 weeks. Do not "push through" the pain.  Listen to your body and avoid positions and maneuvers than reproduce the pain.  Wait a few days before trying something more intense Walking an hour a day is encouraged to help your body recover faster and more safely.  Start slowly and stop when getting sore.  If you can walk 30 minutes without stopping or pain, you can try more intense activity (running, jogging, aerobics, cycling, swimming, treadmill, sex, sports, weightlifting, etc.) Remember: If it hurts to do it, then don't do it! Use Ice and/or Heat You will have swelling and  bruising around the incisions.  This will take several weeks to resolve. Ice packs or heating pads (6-8 times a day, 30-60 minutes at a time) will help sooth soreness & bruising. Some people prefer to use ice alone, heat alone, or alternate between ice & heat.  Experiment and see what works best for you.  Consider trying ice for the first few days to help decrease swelling and bruising; then, switch to heat to help relax sore spots and speed recovery. Shower every day.  Short baths are fine.  It feels good!  Keep the incisions and wounds clean with soap & water.   Try Gentle Massage and/or Stretching Massage at the area of pain many times a day Stop if you feel pain - do not overdo it Take over the counter pain medication This helps the muscle and nerve tissues become less irritable and calm down faster Choose ONE of the following over-the-counter anti-inflammatory medications: Acetaminophen 500mg tabs (Tylenol) 1-2 pills with every meal and just before bedtime (avoid if you have liver problems or if you have   acetaminophen in you narcotic prescription) Naproxen 220mg tabs (ex. Aleve, Naprosyn) 1-2 pills twice a day (avoid if you have kidney, stomach, IBD, or bleeding problems) Ibuprofen 200mg tabs (ex. Advil, Motrin) 3-4 pills with every meal and just before bedtime (avoid if you have kidney, stomach, IBD, or bleeding problems) Take with food/snack several times a day as directed for at least 2 weeks to help keep pain / soreness down & more manageable. Take Narcotic prescription pain medication for more severe pain A prescription for strong pain control is often given to you upon discharge (for example: oxycodone/Percocet, hydrocodone/Norco/Vicodin, or tramadol/Ultram) Take your pain medication as prescribed. Be mindful that most narcotic prescriptions contain Tylenol (acetaminophen) as well - avoid taking too much Tylenol. If you are having problems/concerns with the prescription medicine (does  not control pain, nausea, vomiting, rash, itching, etc.), please call us [phone] to see if we need to switch you to a different pain medicine that will work better for you and/or control your side effects better. If you need a refill on your pain medication, you must call the office before 4 pm and on weekdays only.  By federal law, prescriptions for narcotics cannot be called into a pharmacy.  They must be filled out on paper & picked up from our office by the patient or authorized caretaker.  Prescriptions cannot be filled after 4 pm nor on weekends.    WHEN TO CALL US [phone] Severe uncontrolled or worsening pain  Fever over 101 F (38.5 C) Concerns with the incision: Worsening pain, redness, rash/hives, swelling, bleeding, or drainage Reactions / problems with new medications (itching, rash, hives, nausea, etc.) Nausea and/or vomiting Difficulty urinating Difficulty breathing Worsening fatigue, dizziness, lightheadedness, blurred vision Other concerns If you are not getting better after two weeks or are noticing you are getting worse, contact our office [phone] for further advice.  We may need to adjust your medications, re-evaluate you in the office, send you to the emergency room, or see what other things we can do to help. The clinic staff is available to answer your questions during regular business hours (8:30am-5pm).  Please don't hesitate to call and ask to speak to one of our nurses for clinical concerns.    A surgeon from [facility] is always on call at the hospitals 24 hours/day If you have a medical emergency, go to the nearest emergency room or call 911.  FOLLOW UP in our office One the day of your discharge from the hospital (or the next business weekday), please call [facility] to set up or confirm an appointment to see your surgeon in the office for a follow-up appointment.  Usually it is 2-3 weeks after your surgery.   If you  have skin staples at your incision(s), let the office know so we can set up a time in the office for the nurse to remove them (usually around 10 days after surgery). Make sure that you call for appointments the day of discharge (or the next business weekday) from the hospital to ensure a convenient appointment time. IF YOU HAVE DISABILITY OR FAMILY LEAVE FORMS, BRING THEM TO THE OFFICE FOR PROCESSING.  DO NOT GIVE THEM TO YOUR DOCTOR.  [facility] [address], [address], [place], [place]  [address] ? [phone] - Main [phone] - Toll Free,  [phone] - Fax [email or web address]    GETTING TO GOOD BOWEL HEALTH. It is expected for your digestive tract to   need a few months to get back to normal.  It is common for your bowel movements and stools to be irregular.  You will have occasional bloating and cramping that should eventually fade away.  Until you are eating solid food normally, off all pain medications, and back to regular activities; your bowels will not be normal.   Avoiding constipation The goal: ONE SOFT BOWEL MOVEMENT A DAY!    Drink plenty of fluids.  Choose water first. TAKE A FIBER SUPPLEMENT EVERY DAY THE REST OF YOUR LIFE During your first week back home, gradually add back a fiber supplement every day Experiment which form you can tolerate.   There are many forms such as powders, tablets, wafers, gummies, etc Psyllium bran (Metamucil), methylcellulose (Citrucel), Miralax or Glycolax, Benefiber, Flax Seed.  Adjust the dose week-by-week (1/2 dose/day to 6 doses a day) until you are moving your bowels 1-2 times a day.  Cut back the dose or try a different fiber product if it is giving you problems such as diarrhea or bloating. Sometimes a laxative is needed to help jump-start bowels if constipated until the fiber supplement can help regulate your bowels.  If you are tolerating eating & you are farting, it is okay to try a gentle  laxative such as double dose MiraLax, prune juice, or Milk of Magnesia.  Avoid using laxatives too often. Stool softeners can sometimes help counteract the constipating effects of narcotic pain medicines.  It can also cause diarrhea, so avoid using for too long. If you are still constipated despite taking fiber daily, eating solids, and a few doses of laxatives, call our office. Controlling diarrhea Try drinking liquids and eating bland foods for a few days to avoid stressing your intestines further. Avoid dairy products (especially milk & ice cream) for a short time.  The intestines often can lose the ability to digest lactose when stressed. Avoid foods that cause gassiness or bloating.  Typical foods include beans and other legumes, cabbage, broccoli, and dairy foods.  Avoid greasy, spicy, fast foods.  Every person has some sensitivity to other foods, so listen to your body and avoid those foods that trigger problems for you. Probiotics (such as active yogurt, Align, etc) may help repopulate the intestines and colon with normal bacteria and calm down a sensitive digestive tract Adding a fiber supplement gradually can help thicken stools by absorbing excess fluid and retrain the intestines to act more normally.  Slowly increase the dose over a few weeks.  Too much fiber too soon can backfire and cause cramping & bloating. It is okay to try and slow down diarrhea with a few doses of antidiarrheal medicines.   Bismuth subsalicylate (ex. Kayopectate, Pepto Bismol) for a few doses can help control diarrhea.  Avoid if pregnant.   Loperamide (Imodium) can slow down diarrhea.  Start with one tablet (2mg) first.  Avoid if you are having fevers or severe pain.  ILEOSTOMY PATIENTS WILL HAVE CHRONIC DIARRHEA since their colon is not in use.    Drink plenty of liquids.  You will need to drink even more glasses of water/liquid a day to avoid getting dehydrated. Record output from your ileostomy.  Expect to empty  the bag every 3-4 hours at first.  Most people with a permanent ileostomy empty their bag 4-6 times at the least.   Use antidiarrheal medicine (especially Imodium) several times a day to avoid getting dehydrated.  Start with a dose at bedtime & breakfast.  Adjust up or   down as needed.  Increase antidiarrheal medications as directed to avoid emptying the bag more than 8 times a day (every 3 hours). Work with your wound ostomy nurse to learn care for your ostomy.  See ostomy care instructions. TROUBLESHOOTING IRREGULAR BOWELS 1) Start with a soft & bland diet. No spicy, greasy, or fried foods.  2) Avoid gluten/wheat or dairy products from diet to see if symptoms improve. 3) Miralax 17gm or flax seed mixed in 8oz. water or juice-daily. May use 2-4 times a day as needed. 4) Gas-X, Phazyme, etc. as needed for gas & bloating.  5) Prilosec (omeprazole) over-the-counter as needed 6)  Consider probiotics (Align, Activa, etc) to help calm the bowels down  Call your doctor if you are getting worse or not getting better.  Sometimes further testing (cultures, endoscopy, X-ray studies, CT scans, bloodwork, etc.) may be needed to help diagnose and treat the cause of the diarrhea. [facility] [address], [address], [place], [place]  [address] [phone] - Main.    [phone]  - Toll Free.   [phone] - Fax [email or web address]   ###############################   #######################################################  Ostomy Support Information  You've heard that people get along just fine with only one of their eyes, or one of their lungs, or one of their kidneys. But you also know that you have only one intestine and only one bladder, and that leaves you feeling awfully empty, both physically and emotionally: You think no other people go around without part of their intestine with the ends of their intestines sticking out through their abdominal walls.    YOU ARE NOT ALONE.  There are nearly three quarters of a million people in the [place] who have an ostomy; people who have had surgery to remove all or part of their colons or bladders.   There is even a national association, the [employer] with over 350 local affiliated support groups that are organized by volunteers who provide peer support and counseling. [employer] has a toll free telephone num-ber, 800-826-0826 and an educational, interactive website, www.ostomy.org   An ostomy is an opening in the belly (abdominal wall) made by surgery. Ostomates are people who have had this procedure. The opening (stoma) allows the kidney or bowel to grdischarge waste. An external pouch covers the stoma to collect waste. Pouches are are a simple bag and are odor free. Different companies have disposable or reusable pouches to fit one's lifestyle. An ostomy can either be temporary or permanent.   THERE ARE THREE MAIN TYPES OF OSTOMIES Colostomy. A colostomy is a surgically created opening in the large intestine (colon). Ileostomy. An ileostomy is a surgically created opening in the small intestine. Urostomy. A urostomy is a surgically created opening to divert urine away from the bladder.  OSTOMY Care  The following guidelines will make care of your colostomy easier. Keep this information close by for quick reference.  Helpful DIET hints Eat a well-balanced diet including vegetables and fresh fruits. Eat on a regular schedule.  Drink at least 6 to 8 glasses of fluids daily. Eat slowly in a relaxed atmosphere. Chew your food thoroughly. Avoid chewing gum, smoking, and drinking from a straw. This will help decrease the amount of air you swallow, which may help reduce gas. Eating yogurt or drinking buttermilk may help reduce gas.  To control gas at night, do not eat after 8 p.m. This will give your bowel time to quiet down before you go   to bed.  If gas is a problem, you can purchase  Beano. Sprinkle Beano on the first bite of food before eating to reduce gas. It has no flavor and should not change the taste of your food. You can buy Beano over the counter at your local drugstore.  Foods like fish, onions, garlic, broccoli, asparagus, and cabbage produce odor. Although your pouch is odor-proof, if you eat these foods you may notice a stronger odor when emptying your pouch. If this is a concern, you may want to limit these foods in your diet.  If you have an ileostomy, you will have chronic diarrhea & need to drink more liquids to avoid getting dehydrated.  Consider antidiarrheal medicine like imodium (loperamide) or Lomotil to help slow down bowel movements / diarrhea into your ileostomy bag.  GETTING TO GOOD BOWEL HEALTH WITH AN ILEOSTOMY    With the colon bypassed & not in use, you will have small bowel diarrhea.   It is important to thicken & slow your bowel movements down.   The goal: 4-6 small BOWEL MOVEMENTS A DAY It is important to drink plenty of liquids to avoid getting dehydrated  CONTROLLING ILEOSTOMY DIARRHEA  TAKE A FIBER SUPPLEMENT (FiberCon or Benefiner soluble fiber) twice a day - to thicken stools by absorbing excess fluid and retrain the intestines to act more normally.  Slowly increase the dose over a few weeks.  Too much fiber too soon can backfire and cause cramping & bloating.  TAKE AN IRON SUPPLEMENT twice a day to naturally constipate your bowels.  Usually ferrous sulfate 325mg twice a day)  TAKE ANTI-DIARRHEAL MEDICINES: Loperamide (Imodium) can slow down diarrhea.  Start with two tablets (= 4mg) first and then try one tablet every 6 hours.  Can go up to 2 pills four times day (8 pills of 2mg max) Avoid if you are having fevers or severe pain.  If you are not better or start feeling worse, stop all medicines and call your doctor for advice LoMotil (Diphenoxylate / Atropine) is another medicine that can constipate & slow down bowel moevements Pepto  Bismol (bismuth) can gently thicken bowels as well  If diarrhea is worse,: drink plenty of liquids and try simpler foods for a few days to avoid stressing your intestines further. Avoid dairy products (especially milk & ice cream) for a short time.  The intestines often can lose the ability to digest lactose when stressed. Avoid foods that cause gassiness or bloating.  Typical foods include beans and other legumes, cabbage, broccoli, and dairy foods.  Every person has some sensitivity to other foods, so listen to our body and avoid those foods that trigger problems for you.Call your doctor if you are getting worse or not better.  Sometimes further testing (cultures, endoscopy, X-ray studies, bloodwork, etc) may be needed to help diagnose and treat the cause of the diarrhea. Take extra anti-diarrheal medicines (maximum is 8 pills of 2mg loperamide a day)   Tips for POUCHING an OSTOMY   Changing Your Pouch The best time to change your pouch is in the morning, before eating or drinking anything. Your stoma can function at any time, but it will function more after eating or drinking.   Applying the pouching system  Place all your equipment close at hand before removing your pouch.  Wash your hands.  Stand or sit in front of a mirror. Use the position that works best for you. Remember that you must keep the skin around the stoma   wrinkle-free for a good seal.  Gently remove the used pouch (1-piece system) or the pouch and old wafer (2-piece system). Empty the pouch into the toilet. Save the closure clip to use again.  Wash the stoma itself and the skin around the stoma. Your stoma may bleed a little when being washed. This is normal. Rinse and pat dry. You may use a wash cloth or soft paper towels (like Bounty), mild soap (like Dial, Safeguard, or Ivory), and water. Avoid soaps that contain perfumes or lotions.  For a new pouch (1-piece system) or a new wafer (2-piece system), measure your  stoma using the stoma guide in each box of supplies.  Trace the shape of your stoma onto the back of the new pouch or the back of the new wafer. Cut out the opening. Remove the paper backing and set it aside.  Optional: Apply a skin barrier powder to surrounding skin if it is irritated (bare or weeping), and dust off the excess. Optional: Apply a skin-prep wipe (such as Skin Prep or All-Kare) to the skin around the stoma, and let it dry. Do not apply this solution if the skin is irritated (red, tender, or broken) or if you have shaved around the stoma. Optional: Apply a skin barrier paste (such as Stomahesive, Coloplast, or Premium) around the opening cut in the back of the pouch or wafer. Allow it to dry for 30 to 60 seconds.  Hold the pouch (1-piece system) or wafer (2-piece system) with the sticky side toward your body. Make sure the skin around the stoma is wrinkle-free. Center the opening on the stoma, then press firmly to your abdomen (Fig. 4). Look in the mirror to check if you are placing the pouch, or wafer, in the right position. For a 2-piece system, snap the pouch onto the wafer. Make sure it snaps into place securely.  Place your hand over the stoma and the pouch or wafer for about 30 seconds. The heat from your hand can help the pouch or wafer stick to your skin.  Add deodorant (such as Super Banish or Nullo) to your pouch. Other options include food extracts such as vanilla oil and peppermint extract. Add about 10 drops of the deodorant to the pouch. Then apply the closure clamp. Note: Do not use toxic  chemicals or commercial cleaning agents in your pouch. These substances may harm the stoma.  Optional: For extra seal, apply tape to all 4 sides around the pouch or wafer, as if you were framing a picture. You may use any brand of medical adhesive tape. Change your pouch every 5 to 7 days. Change it immediately if a leak occurs.  Wash your hands afterwards.  If you are wearing a  2-piece system, you may use 2 new pouches per week and alternate them. Rinse the pouch with mild soap and warm water and hang it to dry for the next day. Apply the fresh pouch. Alternate the 2 pouches like this for a week. After a week, change the wafer and begin with 2 new pouches. Place the old pouches in a plastic bag, and put them in the trash.   LIVING WITH AN OSTOMY  Emptying Your Pouch Empty your pouch when it is one-third full (of urine, stool, and/or gas). If you wait until your pouch is fuller than this, it will be more difficult to empty and more noticeable. When you empty your pouch, either put toilet paper in the toilet bowl first, or flush the   toilet while you empty the pouch. This will reduce splashing. You can empty the pouch between your legs or to one side while sitting, or while standing or stooping. If you have a 2-piece system, you can snap off the pouch to empty it. Remember that your stoma may function during this time. If you wish to rinse your pouch after you empty it, a turkey baster can be helpful. When using a baster, squirt water up into the pouch through the opening at the bottom. With a 2-piece system, you can snap off the pouch to rinse it. After rinsing  your pouch, empty it into the toilet. When rinsing your pouch at home, put a few granules of Dreft soap in the rinse water. This helps lubricate and freshen your pouch. The inside of your pouch can be sprayed with non-stick cooking oil (Pam spray). This may help reduce stool sticking to the inside of the pouch.  Bathing You may shower or bathe with your pouch on or off. Remember that your stoma may function during this time.  The materials you use to wash your stoma and the skin around it should be clean, but they do not need to be sterile.  Wearing Your Pouch During hot weather, or if you perspire a lot in general, wear a cover over your pouch. This may prevent a rash on your skin under the pouch. Pouch covers are  sold at ostomy supply stores. Wear the pouch inside your underwear for better support. Watch your weight. Any gain or loss of 10 to 15 pounds or more can change the way your pouch fits.  Going Away From Home A collapsible cup (like those that come in travel kits) or a soft plastic squirt bottle with a pull-up top (like a travel bottle for shampoo) can be used for rinsing your pouch when you are away from home. Tilt the opening of the pouch at an upward angle when using a cup to rinse.  Carry wet wipes or extra tissues to use in public bathrooms.  Carry an extra pouching system with you at all times.  Never keep ostomy supplies in the glove compartment of your car. Extreme heat or cold can damage the skin barriers and adhesive wafers on the pouch.  When you travel, carry your ostomy supplies with you at all times. Keep them within easy reach. Do not pack ostomy supplies in baggage that will be checked or otherwise separated from you, because your baggage might be lost. If you're traveling out of the country, it is helpful to have a letter stating that you are carrying ostomy supplies as a medical necessity.  If you need ostomy supplies while traveling, look in the yellow pages of the telephone book under "Surgical Supplies." Or call the local ostomy organization to find out where supplies are available.  Do not let your ostomy supplies get low. Always order new pouches before you use the last one.  Reducing Odor Limit foods such as broccoli, cabbage, onions, fish, and garlic in your diet to help reduce odor. Each time you empty your pouch, carefully clean the opening of the pouch, both inside and outside, with toilet paper. Rinse your pouch 1 or 2 times daily after you empty it (see directions for emptying your pouch and going away from home). Add deodorant (such as Super Banish or Nullo) to your pouch. Use air deodorizers in your bathroom. Do not add aspirin to your pouch. Even though  aspirin can help prevent odor, it   could cause ulcers on your stoma.  When to call the doctor Call the doctor if you have any of the following symptoms: Purple, black, or white stoma Severe cramps lasting more than 6 hours Severe watery discharge from the stoma lasting more than 6 hours No output from the colostomy for 3 days Excessive bleeding from your stoma Swelling of your stoma to more than 1/2-inch larger than usual Pulling inward of your stoma below skin level Severe skin irritation or deep ulcers Bulging or other changes in your abdomen  When to call your ostomy nurse Call your ostomy/enterostomal therapy (WOCN) nurse if any of the following occurs: Frequent leaking of your pouching system Change in size or appearance of your stoma, causing discomfort or problems with your pouch Skin rash or rawness Weight gain or loss that causes problems with your pouch     FREQUENTLY ASKED QUESTIONS   Why haven't you met any of these folks who have an ostomy?  Well, maybe you have! You just did not recognize them because an ostomy doesn't show. It can be kept secret if you wish. Why, maybe some of your best friends, office associates or neighbors have an ostomy ... you never can tell. People facing ostomy surgery have many quality-of-life questions like: Will you bulge? Smell? Make noises? Will you feel waste leaving your body? Will you be a captive of the toilet? Will you starve? Be a social outcast? Get/stay married? Have babies? Easily bathe, go swimming, bend over?  OK, let's look at what you can expect:   Will you bulge?  Remember, without part of the intestine or bladder, and its contents, you should have a flatter tummy than before. You can expect to wear, with little exception, what you wore before surgery ... and this in-cludes tight clothing and bathing suits.   Will you smell?  Today, thanks to modern odor proof pouching systems, you can walk into an ostomy support group  meeting and not smell anything that is foul or offensive. And, for those with an ileostomy or colostomy who are concerned about odor when emptying their pouch, there are in-pouch deodorants that can be used to eliminate any waste odors that may exist.   Will you make noises?  Everyone produces gas, especially if they are an air-swallower. But intestinal sounds that occur from time to time are no differ-ent than a gurgling tummy, and quite often your clothing will muffle any sounds.   Will you feel the waste discharges?  For those with a colostomy or ileostomy there might be a slight pressure when waste leaves your body, but understand that the intestines have no nerve endings, so there will be no unpleasant sensations. Those with a urostomy will probably be unaware of any kidney drainage.   Will you be a captive of the toilet?  Immediately post-op you will spend more time in the bathroom than you will after your body recovers from surgery. Every person is different, but on average those with an ileostomy or urostomy may empty their pouches 4 to 6 times a day; a little  less if you have a colostomy. The average wear time between pouch system changes is 3 to 5 days and the changing process should take less than 30 minutes.   Will I need to be on a special diet? Most people return to their normal diet when they have recovered from surgery. Be sure to chew your food well, eat a well-balanced diet and drink plenty of fluids. If   you experience problems with a certain food, wait a couple of weeks and try it again.  Will there be odor and noises? Pouching systems are designed to be odor-proof or odor-resistant. There are deodorants that can be used in the pouch. Medications are also available to help reduce odor. Limit gas-producing foods and carbonated beverages. You will experience less gas and fewer noises as you heal from surgery.  How much time will it take to care for my ostomy? At first, you may  spend a lot of time learning about your ostomy and how to take care of it. As you become more comfortable and skilled at changing the pouching system, it will take very little time to care for it.   Will I be able to return to work? People with ostomies can perform most jobs. As soon as you have healed from surgery, you should be able to return to work. Heavy lifting (more than 10 pounds) may be discouraged.   What about intimacy? Sexual relationships and intimacy are important and fulfilling aspects of your life. They should continue after ostomy surgery. Intimacy-related concerns should be discussed openly between you and your partner.   Can I wear regular clothing? You do not need to wear special clothing. Ostomy pouches are fairly flat and barely noticeable. Elastic undergarments will not hurt the stoma or prevent the ostomy from functioning.   Can I participate in sports? An ostomy should not limit your involvement in sports. Many people with ostomies are runners, skiers, swimmers or participate in other active lifestyles. Talk with your caregiver first before doing heavy physical activity.  Will you starve?  Not if you follow doctor's orders at each stage of your post-op adjustment. There is no such thing as an "ostomy diet". Some people with an ostomy will be able to eat and tolerate anything; others may find diffi-culty with some foods. Each person is an individual and must determine, by trial, what is best for them. A good practice for all is to drink plenty of water.   Will you be a social outcast?  Have you met anyone who has an ostomy and is a social outcast? Why should you be the first? Only your attitude and self image will effect how you are treated. No confi-dent person is an outcast.    PROFESSIONAL HELP   Resources are available if you need help or have questions about your ostomy.   Specially trained nurses called Wound, Ostomy Continence Nurses (WOCN) are available for  consultation in most major medical centers.  Consider getting an ostomy consult at an outpatient ostomy clinic.   [facility] has an [facility] Clinic run by an WOCN ostomy nurse at the [facility].  [phone]. [facility] can help set up an appointment   The United Ostomy Association (UOA) is a group made up of many local chapters throughout the [place]. These local groups hold meetings and provide support to prospective and existing ostomates. They sponsor educational events and have qualified visitors to make personal or telephone visits. Contact the UOA for the chapter nearest you and for other educational publications.  More detailed information can be found in Colostomy Guide, a publication of the United Ostomy Association (UOA). Contact UOA at 1-800-826-0826 or visit their web site at www.uoaa.org. The website contains links to other sites, suppliers and resources.  [facility] Secure Start Services: Start at the website to enlist for support.  Your Wound Ostomy (WOCN) nurse may have started this   process. https://www.hollister.com/en/securestart Secure Start services are designed to support people as they live their lives with an ostomy or neurogenic bladder. Enrolling is easy and at no cost to the patient. We realize that each person's needs and life journey are different. Through Secure Start services, we want to help people live their life, their way.  #######################################################  

## 2021-06-17 NOTE — Progress Notes (Signed)
D/C instructions given to patient. Patient had no questions. NT or writer will wheel patient out once she is dressed and her ride is here

## 2021-06-17 NOTE — Progress Notes (Signed)
Still awaiting the results from the cytology on the right pleural effusion.  Maybe this will come out today.  Again, I said I will have to think that this might be recurrent melanoma since she had the subungual melanoma removed a year or so ago.  The repeat CT scans would not show anything new.  There is nothing with the brain with respect to metastatic disease.  Her LDH was 170.  Everything will really be based upon the cytology on the fluid.  If the fluid cytology is negative, then she might need to have a VATS procedure to get pleural biopsies.  Really, not much else that we need to do right now.  Peggy Haw, MD  Oswaldo Milian 26:4

## 2021-06-17 NOTE — Progress Notes (Signed)
PROGRESS NOTE  Peggy Howard WLN:[id number] DOB: [birth year]   PCP: Lorrene Reid, PA-C  Patient is from: Home.   DOA: 06/12/2021 LOS: 5  Chief complaints:  No chief complaint on file.    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  Hospitalist service consulted for respiratory distress with shortness of breath, cough and wheeze.  She had CTA chest that showed large right pleural effusion and diffuse pulmonary nodules concerning for malignancy.  She underwent thoracocentesis with removal of 1.4 L of exudative fluid with improvement in her symptoms.  Oncology consulted and following.  CT head, abdomen and pelvis with no significant finding other than what was seen on CT chest.  Patient was started on [facility] and as needed albuterol.  Pleural fluid cytology, fungal culture and pH pending.   Subjective: Seen and examined earlier this morning.  No major events overnight or this morning.  Maintaining appropriate saturation on room air.  Still with some cough and intermittent wheeze.  Objective: Vitals:   06/16/21 2033 06/17/21 0500 06/17/21 0608 06/17/21 0720  BP: (!) 147/70  135/74   Pulse: 83  90   Resp: 18  18   Temp: 98.3 F (36.8 C)  97.7 F (36.5 C)   TempSrc: Oral  Oral   SpO2: 93%  (!) 89% 91%  Weight:  76.5 kg    Height:        Intake/Output Summary (Last 24 hours) at 06/17/2021 1109 Last data filed at 06/17/2021 0900 Gross per 24 hour  Intake 420 ml  Output 48 ml  Net 372 ml   Filed Weights   06/13/21 0500 06/14/21 0500 06/17/21 0500  Weight: 80.4 kg 80.4 kg 76.5 kg    Examination:   GENERAL: No apparent distress.  Nontoxic. HEENT: MMM.  Vision and hearing grossly intact.  NECK: Supple.  No apparent JVD.  RESP: 91% on RA.  No  IWOB.  Some wheeze, cough and rhonchi. CVS:  RRR. Heart sounds normal.  ABD/GI/GU: BS+. Abd soft.  Slightly tender.  Drain over RUQ. MSK/EXT:  Moves extremities. No apparent deformity. No edema.  SKIN: no apparent skin lesion or wound NEURO: Awake and alert. Oriented appropriately.  No apparent focal neuro deficit. PSYCH: Calm. Normal affect.   Procedures:  7/20-robotic rectopexy for rectal prolapse 7/22-right thoracocentesis with removal of 1.4 exudative fluid  Microbiology summarized: Pleural fluid culture NGTD. Assessment & Plan: Respiratory distress due to right pleural effusion-exudative.  CTA chest negative for PE but  large right pleural effusion, pleural-based nodularity and mediastinal LAD concerning for malignancy.  Breathing improved with thoracocentesis.  Pleural fluid culture NGTD.  BNP within normal.  She has no documented desaturation although she is started on supplemental oxygen.  Weaned off oxygen.  Maintaining saturation above 90% with ambulation on room air. -Incentive spirometry -IV Solu-Medrol 125 mg once followed by p.o. prednisone 50 mg daily for 4 days -Continue Dulera and albuterol. -Rx for prednisone, Dulera and albuterol printed. -Follow pleural pH, fungal culture and cytology. -She is okay for discharge from our standpoint.  Discussed with primary team  Lung nodules/large pleural effusion-concern about malignancy.  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  CT abdomen and pelvis without significant finding other than what is  noted on CT chest. -Follow pleural pH, fungal culture and cytology -Oncology following.  History of rectal prolapse s/p robotic rectopexy on 7/20 -Per general surgery.  Class I obesity Body mass index is 28.96 kg/m.         DVT prophylaxis:  enoxaparin (LOVENOX) injection 40 mg Start: 06/13/21 0800 SCD's Start: 06/12/21 1127  Code Status: Full code Family Communication: Patient  and/or RN. Available if any question.  Level of care: Med-Surg Status is: Inpatient   Dispo: Per primary.  Okay to discharge from medical standpoint.   Sch Meds:  Scheduled Meds:  bisacodyl  5 mg Oral BID   enoxaparin (LOVENOX) injection  40 mg Subcutaneous Q24H   feeding supplement  237 mL Oral BID BM   methylPREDNISolone (SOLU-MEDROL) injection  125 mg Intravenous Once   mometasone-formoterol  2 puff Inhalation BID   psyllium  1 packet Oral BID   saccharomyces boulardii  250 mg Oral BID   Continuous Infusions: PRN Meds:.acetaminophen, alum & mag hydroxide-simeth, HYDROmorphone (DILAUDID) injection, ipratropium-albuterol, ondansetron **OR** ondansetron (ZOFRAN) IV, simethicone, traMADol  Antimicrobials: Anti-infectives (From admission, onward)    Start     Dose/Rate Route Frequency Ordered Stop   06/12/21 2100  cefoTEtan (CEFOTAN) 2 g in sodium chloride 0.9 % 100 mL IVPB        2 g 200 mL/hr over 30 Minutes Intravenous Every 12 hours 06/12/21 1126 06/12/21 2052   06/12/21 0600  cefoTEtan (CEFOTAN) 2 g in sodium chloride 0.9 % 100 mL IVPB        2 g 200 mL/hr over 30 Minutes Intravenous On call to O.R. 06/11/21 4540 06/12/21 0840        I have personally reviewed the following labs and images: CBC: Recent Labs  Lab 06/13/21 0421 06/14/21 0409 06/15/21 0349  WBC 8.4 7.9 9.1  HGB 11.7* 12.3 12.6  HCT 37.2 39.8 39.5  MCV 99.2 99.3 97.8  PLT 277 290 289   BMP &GFR Recent Labs  Lab 06/13/21 0421 06/14/21 0409 06/15/21 0349  NA 140 139 141  K 4.3 4.8 4.7  CL 105 106 101  CO2 27 27 32  GLUCOSE 109* 109* 160*  BUN 13 19 16   CREATININE 0.74 0.95 0.78  CALCIUM 8.5* 8.5* 8.8*   Estimated Creatinine Clearance: 51.5 mL/min (by C-G formula based on SCr of 0.78 mg/dL). Liver & Pancreas: Recent Labs  Lab 06/15/21 0349  AST 17  ALT 15  ALKPHOS 60  BILITOT 0.9  PROT 6.2*  ALBUMIN 3.1*   No results for input(s): LIPASE, AMYLASE in the last 168 hours. No  results for input(s): AMMONIA in the last 168 hours. Diabetic: No results for input(s): HGBA1C in the last 72 hours. No results for input(s): GLUCAP in the last 168 hours. Cardiac Enzymes: No results for input(s): CKTOTAL, CKMB, CKMBINDEX, TROPONINI in the last 168 hours. No results for input(s): PROBNP in the last 8760 hours. Coagulation Profile: No results for input(s): INR, PROTIME in the last 168 hours. Thyroid Function Tests: No results for input(s): TSH, T4TOTAL, FREET4, T3FREE, THYROIDAB in the last 72 hours. Lipid Profile: No results for input(s): CHOL, HDL, LDLCALC, TRIG, CHOLHDL, LDLDIRECT in the last 72 hours. Anemia Panel: No results for input(s): VITAMINB12, FOLATE, FERRITIN, TIBC, IRON, RETICCTPCT in the last 72 hours. Urine analysis:    Component Value Date/Time   COLORURINE YELLOW 03/25/2021 1537   APPEARANCEUR HAZY (A) 03/25/2021 1537   LABSPEC 1.027 03/25/2021 1537   PHURINE 6.0 03/25/2021 1537  GLUCOSEU NEGATIVE 03/25/2021 1537   HGBUR MODERATE (A) 03/25/2021 1537   BILIRUBINUR NEGATIVE 03/25/2021 1537   KETONESUR NEGATIVE 03/25/2021 1537   PROTEINUR NEGATIVE 03/25/2021 1537   NITRITE NEGATIVE 03/25/2021 1537   LEUKOCYTESUR NEGATIVE 03/25/2021 1537   Sepsis Labs: Invalid input(s): PROCALCITONIN, [place]  Microbiology: Recent Results (from the past 240 hour(s))  SARS CORONAVIRUS 2 (TAT 6-24 HRS) Nasopharyngeal Nasopharyngeal Swab     Status: None   Collection Time: 06/10/21 11:59 AM   Specimen: Nasopharyngeal Swab  Result Value Ref Range Status   SARS Coronavirus 2 NEGATIVE NEGATIVE Final    Comment: (NOTE) SARS-CoV-2 target nucleic acids are NOT DETECTED.  The SARS-CoV-2 RNA is generally detectable in upper and lower respiratory specimens during the acute phase of infection. Negative results do not preclude SARS-CoV-2 infection, do not rule out co-infections with other pathogens, and should not be used as the sole basis for treatment or other  patient management decisions. Negative results must be combined with clinical observations, patient history, and epidemiological information. The expected result is Negative.  Fact Sheet for Patients: SugarRoll.be  Fact Sheet for Healthcare Providers: https://www.woods-mathews.com/  This test is not yet approved or cleared by the [place] FDA and  has been authorized for detection and/or diagnosis of SARS-CoV-2 by FDA under an Emergency Use Authorization (EUA). This EUA will remain  in effect (meaning this test can be used) for the duration of the COVID-19 declaration under Se ction 564(b)(1) of the Act, 21 U.S.C. section 360bbb-3(b)(1), unless the authorization is terminated or revoked sooner.  Performed at [facility], [place] [address], [place], [place] [address]   Body fluid culture w Gram Stain     Status: None   Collection Time: 06/14/21  4:41 PM   Specimen: PATH Cytology Pleural fluid  Result Value Ref Range Status   Specimen Description   Final    PLEURAL Performed at [place] [address], [place], [place] [address]    Special Requests   Final    NONE Performed at [facility], [place] [address], [place], [place] [address]    Gram Stain   Final    RARE WBC PRESENT,BOTH PMN AND MONONUCLEAR NO ORGANISMS SEEN    Culture   Final    NO GROWTH Performed at [facility], [place] [address] [place], [place] [address]    Report Status 06/17/2021 FINAL  Final    Radiology Studies: DG CHEST PORT 1 VIEW  Result Date: 06/17/2021 CLINICAL DATA:  Pleural effusion on right EXAM: PORTABLE CHEST 1 VIEW COMPARISON:  06/14/2021 FINDINGS: Stable to slightly increased right pleural effusion allowing for differences in technique. Right basilar atelectasis. Right perihilar adenopathy/mass. IMPRESSION: Stable to slightly increased right pleural effusion. Right basilar atelectasis. Right  perihilar adenopathy or mass. Electronically Signed   By: Macy Mis M.D.   On: 06/17/2021 07:54       Jacen Carlini T. [place]  If 7PM-7AM, please contact night-coverage [email or web address] 06/17/2021, 11:09 AM

## 2021-06-17 NOTE — Progress Notes (Signed)
5 Days Post-Op robotic rectopexy Subjective: Doing well from surgical standpoint.  Tolerating a diet and having bowel function.  Continuing workup of lung mass and effusions.  Pt still getting some SOB  Objective: Vital signs in last 24 hours: Temp:  [97.7 F (36.5 C)-98.3 F (36.8 C)] 97.7 F (36.5 C) (07/25 4970) Pulse Rate:  [80-90] 90 (07/25 0608) Resp:  [16-18] 18 (07/25 0608) BP: (120-147)/(68-74) 135/74 (07/25 0608) SpO2:  [89 %-98 %] 91 % (07/25 0720) Weight:  [76.5 kg] 76.5 kg (07/25 0500)   Intake/Output from previous day: 07/24 0701 - 07/25 0700 In: 540 [P.O.:540] Out: 43 [Drains:43] Intake/Output this shift: No intake/output data recorded.   General appearance: alert and cooperative GI: soft, non tender JP: SS fluid Incision: no significant drainage  Lab Results:  Recent Labs    06/15/21 0349  WBC 9.1  HGB 12.6  HCT 39.5  PLT 289    BMET Recent Labs    06/15/21 0349  NA 141  K 4.7  CL 101  CO2 32  GLUCOSE 160*  BUN 16  CREATININE 0.78  CALCIUM 8.8*    PT/INR No results for input(s): LABPROT, INR in the last 72 hours. ABG No results for input(s): PHART, HCO3 in the last 72 hours.  Invalid input(s): PCO2, PO2  MEDS, Scheduled  bisacodyl  5 mg Oral BID   enoxaparin (LOVENOX) injection  40 mg Subcutaneous Q24H   feeding supplement  237 mL Oral BID BM   mometasone-formoterol  2 puff Inhalation BID   psyllium  1 packet Oral BID   saccharomyces boulardii  250 mg Oral BID    Studies/Results: CT HEAD W & WO CONTRAST  Result Date: 06/15/2021 CLINICAL DATA:  Cancer of unknown primary, staging. EXAM: CT HEAD WITHOUT AND WITH CONTRAST TECHNIQUE: Contiguous axial images were obtained from the base of the skull through the vertex without and with intravenous contrast CONTRAST:  50mL OMNIPAQUE IOHEXOL 350 MG/ML SOLN COMPARISON:  None. FINDINGS: Brain: There is no evidence of an acute infarct, intracranial hemorrhage, mass, midline shift, or  extra-axial fluid collection. The ventricles and sulci are within normal limits for age. Hypodensities in the cerebral white matter bilaterally are nonspecific but compatible with mild chronic small vessel ischemic disease. No abnormal intracranial enhancement suggestive of metastatic disease is seen. A small developmental venous anomaly is incidentally noted in the right parietal lobe. Vascular: Calcified atherosclerosis at the skull base. Major dural venous sinuses and large arteries at the base of the brain are grossly patent. Skull: Well-defined 2 cm lucency scalloping the inner table of the left parietal skull with internal fluid density, benign in appearance. Sinuses/Orbits: Visualized paranasal sinuses and mastoid air cells are clear. Unremarkable orbits. Other: None. IMPRESSION: 1. No evidence of intracranial metastases. 2. Mild chronic small vessel ischemic disease. Electronically Signed   By: Logan Bores M.D.   On: 06/15/2021 14:23   CT ABDOMEN PELVIS W CONTRAST  Result Date: 06/15/2021 CLINICAL DATA:  Cancer of unknown primary. EXAM: CT ABDOMEN AND PELVIS WITH CONTRAST TECHNIQUE: Multidetector CT imaging of the abdomen and pelvis was performed using the standard protocol following bolus administration of intravenous contrast. CONTRAST:  36mL OMNIPAQUE IOHEXOL 350 MG/ML SOLN COMPARISON:  June 14, 2021.  Mar 25, 2021. FINDINGS: Lower chest: Small right pleural effusion is noted with associated atelectasis. Multiple pleural based nodules are noted in the right hemothorax consistent with metastatic disease. Hepatobiliary: Cholelithiasis is noted. No biliary dilatation is noted. The liver is unremarkable. Pancreas: Unremarkable. No  pancreatic ductal dilatation or surrounding inflammatory changes. Spleen: Normal in size without focal abnormality. Adrenals/Urinary Tract: Adrenal glands are unremarkable. Kidneys are normal, without renal calculi, focal lesion, or hydronephrosis. Bladder is unremarkable.  Stomach/Bowel: The stomach appears normal. There is no evidence of bowel obstruction. Stool is noted throughout the colon. Patient is reportedly status post robotic assisted rectopexy. Mild stranding is noted around the rectum consistent with postsurgical status. Surgical drain is seen entering the right upper quadrant of the abdomen with distal tip extending to the right perirectal region. Vascular/Lymphatic: Aortic atherosclerosis. No enlarged abdominal or pelvic lymph nodes. Reproductive: Status post hysterectomy. No adnexal masses. Other: Small amount of gas is seen within the subcutaneous tissues anteriorly in the pelvis consistent with recent surgery. No abnormal fluid collection is noted. No definite hernia is noted. Musculoskeletal: No acute or significant osseous findings. IMPRESSION: Patient is reportedly status post interval robotic assisted rectopexy. Surgical drain is noted extending from right upper quadrant of the abdomen with distal tip in the right perirectal region. Expected postsurgical changes are noted around the rectum. Cholelithiasis is noted. Small right pleural effusion is again noted with associated atelectasis. Multiple pleural based nodules are noted in the visualized right hemithorax consistent with metastatic disease. Aortic Atherosclerosis (ICD10-I70.0). Electronically Signed   By: Marijo Conception M.D.   On: 06/15/2021 14:25   DG CHEST PORT 1 VIEW  Result Date: 06/17/2021 CLINICAL DATA:  Pleural effusion on right EXAM: PORTABLE CHEST 1 VIEW COMPARISON:  06/14/2021 FINDINGS: Stable to slightly increased right pleural effusion allowing for differences in technique. Right basilar atelectasis. Right perihilar adenopathy/mass. IMPRESSION: Stable to slightly increased right pleural effusion. Right basilar atelectasis. Right perihilar adenopathy or mass. Electronically Signed   By: Macy Mis M.D.   On: 06/17/2021 07:54    Assessment: s/p Procedure(s): XI ROBOT ASSISTED  RECTOPEXY Patient Active Problem List   Diagnosis Date Noted   Rectal prolapse 06/12/2021   GIB (gastrointestinal bleeding) 03/26/2021   BRBPR (bright red blood per rectum) 03/25/2021   Abdominal pain 03/25/2021   Low back pain 03/25/2021   Overweight (BMI 25.0-29.9) 07/16/2017   Colonoscopy refused 07/16/2017   Refuses treatment 07/16/2017   Parent refuses immunizations 07/16/2017   Patient refuses to take medication 07/16/2017   History of cigarette smoking 06/26/2017   Other fatigue 06/26/2017   Memory changes 06/26/2017   Community acquired pneumonia of right middle lobe of lung    External hemorrhoids 01/26/2008    Expected post op course  Plan: Cont diet and fiber supplements D/c JP Ok for d/c once medically stable   LOS: 5 days     .Rosario Adie, MD [facility], [place]    06/17/2021 8:25 AM

## 2021-06-17 NOTE — Progress Notes (Signed)
Received a page from [place] stating that Peggy Howard is not covered by patient's insurance but Advair is.  Changed prescription to Advair 250/50.

## 2021-06-17 NOTE — Discharge Summary (Signed)
Physician Discharge Summary  Patient ID: Peggy Howard MRN: [id number] DOB/AGE: 25-Jan-1933 87 y.o.  Admit date: 06/12/2021 Discharge date: 06/17/2021  Admission Diagnoses:  Discharge Diagnoses:  Active Problems:   Rectal prolapse   Discharged Condition: good  Hospital Course: Show patient was admitted to the hospital after robotic assisted rectopexy.  Her diet was advanced and she began to have bowel function again on postop day 1.  On the morning of postop day 2 she developed acute shortness of breath.  Chest x-ray showed some consolidation in the right lower lung.  CT scan showed a pulmonary effusion with several right-sided pulmonary nodules.  This was concerning for metastatic disease.  She underwent a work-up with the hospital service which included CT scan of the head and abdomen and pelvis as well.  She underwent a pleurocentesis in interventional radiology for evaluation of the tumor and treatment of her shortness of breath.  This resolved her symptoms but she continued to have episodes of shortness of breath requiring oxygen.  By the morning of discharge she had tolerated being off oxygen for the past 24 hours.  She was felt to be in stable condition for discharge to home.  She will follow-up with oncology for further evaluation of her new pulmonary findings.  Consults:  hospitalist, oncology  Significant Diagnostic Studies: labs: pleural fluid evaluation, cbc, bmet, radiology: CT scan: head, chest, abd and pelvis , and Vascular US  Treatments: IV hydration, analgesia: acetaminophen, and procedures: thoracentesis for symptomatic pelural effusion  Discharge Exam: Blood pressure 135/74, pulse 90, temperature 97.7 F (36.5 C), temperature source Oral, resp. rate 18, height 5\' 4"  (1.626 m), weight 76.5 kg, SpO2 91 %. General appearance: alert and cooperative GI: normal findings: soft, non-tender Incision/Wound: clean, dry, intact  Disposition: Discharge disposition: 01-Home  or Self Care        Allergies as of 06/17/2021       Reactions   Vitamin D Analogs    "dizziness"        Medication List     STOP taking these medications    glycerin adult 2 g suppository   mesalamine 1000 MG suppository Commonly known as: CANASA   vitamin E 180 MG (400 UNITS) capsule       TAKE these medications    acetaminophen 500 MG tablet Commonly known as: TYLENOL Take 1,000 mg by mouth every 6 (six) hours as needed for mild pain, fever or headache.   albuterol 108 (90 Base) MCG/ACT inhaler Commonly known as: VENTOLIN HFA Inhale 2 puffs into the lungs every 6 (six) hours as needed for wheezing or shortness of breath.   bisacodyl 5 MG EC tablet Commonly known as: DULCOLAX Take 1 tablet (5 mg total) by mouth 2 (two) times daily.   ferrous sulfate 325 (65 FE) MG tablet Take 325 mg by mouth daily with breakfast.   Metamucil Smooth Texture 58.6 % powder Generic drug: psyllium Take 1 packet by mouth in the morning and at bedtime.   mometasone-formoterol 200-5 MCG/ACT Aero Commonly known as: DULERA Inhale 2 puffs into the lungs 2 (two) times daily.   multivitamin with minerals Tabs tablet Take 1 tablet by mouth daily.   predniSONE 50 MG tablet Commonly known as: DELTASONE Take 1 tablet (50 mg total) by mouth daily with breakfast. Start taking on: June 18, 2021   PRESERVISION AREDS 2 PO Take 1 tablet by mouth 2 (two) times daily.   VITAMIN C PO Take 1 tablet by mouth daily.  ASK your doctor about these medications    traMADol 50 MG tablet Commonly known as: ULTRAM Take 0.5 tablets (25 mg total) by mouth every 6 (six) hours as needed.        Follow-up Information     Leighton Ruff, MD. Schedule an appointment as soon as possible for a visit in 2 week(s).   Specialties: General Surgery, Colon and Rectal Surgery Contact information: [address] [place] [address] [phone]         Volanda Napoleon, MD.  Schedule an appointment as soon as possible for a visit in 2 week(s).   Specialty: Oncology Contact information: [address] [place]  [address] [phone]                 Signed: Rosario Adie 1/85/8833, 10:53 AM

## 2021-06-17 NOTE — Telephone Encounter (Signed)
Patient son Niel Hummer called stating that his mom is being discharged from the hospital and was told to follow up with Dr Marin Olp.  No referral seen at this time by me but transferred call to schedulers to see if we can schedule her  based on referral

## 2021-06-20 LAB — CYTOLOGY - NON PAP

## 2021-06-21 ENCOUNTER — Inpatient Hospital Stay (HOSPITAL_BASED_OUTPATIENT_CLINIC_OR_DEPARTMENT_OTHER): Payer: Medicare Other | Admitting: Hematology & Oncology

## 2021-06-21 ENCOUNTER — Encounter: Payer: Self-pay | Admitting: Hematology & Oncology

## 2021-06-21 ENCOUNTER — Encounter: Payer: Self-pay | Admitting: *Deleted

## 2021-06-21 ENCOUNTER — Ambulatory Visit (HOSPITAL_BASED_OUTPATIENT_CLINIC_OR_DEPARTMENT_OTHER)
Admission: RE | Admit: 2021-06-21 | Discharge: 2021-06-21 | Disposition: A | Discharge status: Home / Self Care | Payer: Medicare Other | Source: Ambulatory Visit | Attending: Hematology & Oncology | Admitting: Hematology & Oncology

## 2021-06-21 ENCOUNTER — Other Ambulatory Visit: Payer: Self-pay

## 2021-06-21 ENCOUNTER — Inpatient Hospital Stay: Payer: Medicare Other | Attending: Hematology & Oncology

## 2021-06-21 VITALS — BP 167/73 | HR 77 | Temp 97.7°F | Resp 24 | Wt 161.1 lb

## 2021-06-21 DIAGNOSIS — Z7189 Other specified counseling: Secondary | ICD-10-CM | POA: Diagnosis not present

## 2021-06-21 DIAGNOSIS — C7800 Secondary malignant neoplasm of unspecified lung: Secondary | ICD-10-CM | POA: Insufficient documentation

## 2021-06-21 DIAGNOSIS — J910 Malignant pleural effusion: Secondary | ICD-10-CM | POA: Insufficient documentation

## 2021-06-21 DIAGNOSIS — C439 Malignant melanoma of skin, unspecified: Secondary | ICD-10-CM

## 2021-06-21 DIAGNOSIS — C7801 Secondary malignant neoplasm of right lung: Secondary | ICD-10-CM | POA: Diagnosis not present

## 2021-06-21 DIAGNOSIS — C799 Secondary malignant neoplasm of unspecified site: Secondary | ICD-10-CM | POA: Diagnosis not present

## 2021-06-21 DIAGNOSIS — Z79899 Other long term (current) drug therapy: Secondary | ICD-10-CM | POA: Insufficient documentation

## 2021-06-21 HISTORY — DX: Secondary malignant neoplasm of unspecified lung: C78.00

## 2021-06-21 HISTORY — DX: Other specified counseling: Z71.89

## 2021-06-21 HISTORY — DX: Malignant pleural effusion: J91.0

## 2021-06-21 LAB — CBC WITH DIFFERENTIAL (CANCER CENTER ONLY)
Abs Immature Granulocytes: 0.08 10*3/uL — ABNORMAL HIGH (ref 0.00–0.07)
Basophils Absolute: 0 10*3/uL (ref 0.0–0.1)
Basophils Relative: 0 %
Eosinophils Absolute: 0 10*3/uL (ref 0.0–0.5)
Eosinophils Relative: 0 %
HCT: 37.9 % (ref 36.0–46.0)
Hemoglobin: 12.4 g/dL (ref 12.0–15.0)
Immature Granulocytes: 1 %
Lymphocytes Relative: 6 %
Lymphs Abs: 0.9 10*3/uL (ref 0.7–4.0)
MCH: 31.2 pg (ref 26.0–34.0)
MCHC: 32.7 g/dL (ref 30.0–36.0)
MCV: 95.2 fL (ref 80.0–100.0)
Monocytes Absolute: 0.1 10*3/uL (ref 0.1–1.0)
Monocytes Relative: 1 %
Neutro Abs: 12.7 10*3/uL — ABNORMAL HIGH (ref 1.7–7.7)
Neutrophils Relative %: 92 %
Platelet Count: 408 10*3/uL — ABNORMAL HIGH (ref 150–400)
RBC: 3.98 MIL/uL (ref 3.87–5.11)
RDW: 13.3 % (ref 11.5–15.5)
WBC Count: 13.9 10*3/uL — ABNORMAL HIGH (ref 4.0–10.5)
nRBC: 0 % (ref 0.0–0.2)

## 2021-06-21 LAB — CMP (CANCER CENTER ONLY)
ALT: 22 U/L (ref 0–44)
AST: 18 U/L (ref 15–41)
Albumin: 3.7 g/dL (ref 3.5–5.0)
Alkaline Phosphatase: 64 U/L (ref 38–126)
Anion gap: 11 (ref 5–15)
BUN: 37 mg/dL — ABNORMAL HIGH (ref 8–23)
CO2: 26 mmol/L (ref 22–32)
Calcium: 9 mg/dL (ref 8.9–10.3)
Chloride: 104 mmol/L (ref 98–111)
Creatinine: 0.78 mg/dL (ref 0.44–1.00)
GFR, Estimated: >60 mL/min (ref >60)
Glucose, Bld: 200 mg/dL — ABNORMAL HIGH (ref 70–99)
Potassium: 3.9 mmol/L (ref 3.5–5.1)
Sodium: 141 mmol/L (ref 135–145)
Total Bilirubin: 0.5 mg/dL (ref 0.3–1.2)
Total Protein: 6.4 g/dL — ABNORMAL LOW (ref 6.5–8.1)

## 2021-06-21 LAB — LACTATE DEHYDROGENASE: LDH: 257 U/L — ABNORMAL HIGH (ref 98–192)

## 2021-06-21 IMAGING — CR DG CHEST 2V
2 series · 2 of 2 positions shown · non-contrast
Comparison: [DATE].  [DATE].

CLINICAL DATA: History of melanoma. Question right pleural effusion

EXAM:
CHEST - 2 VIEW

[w chest pa]
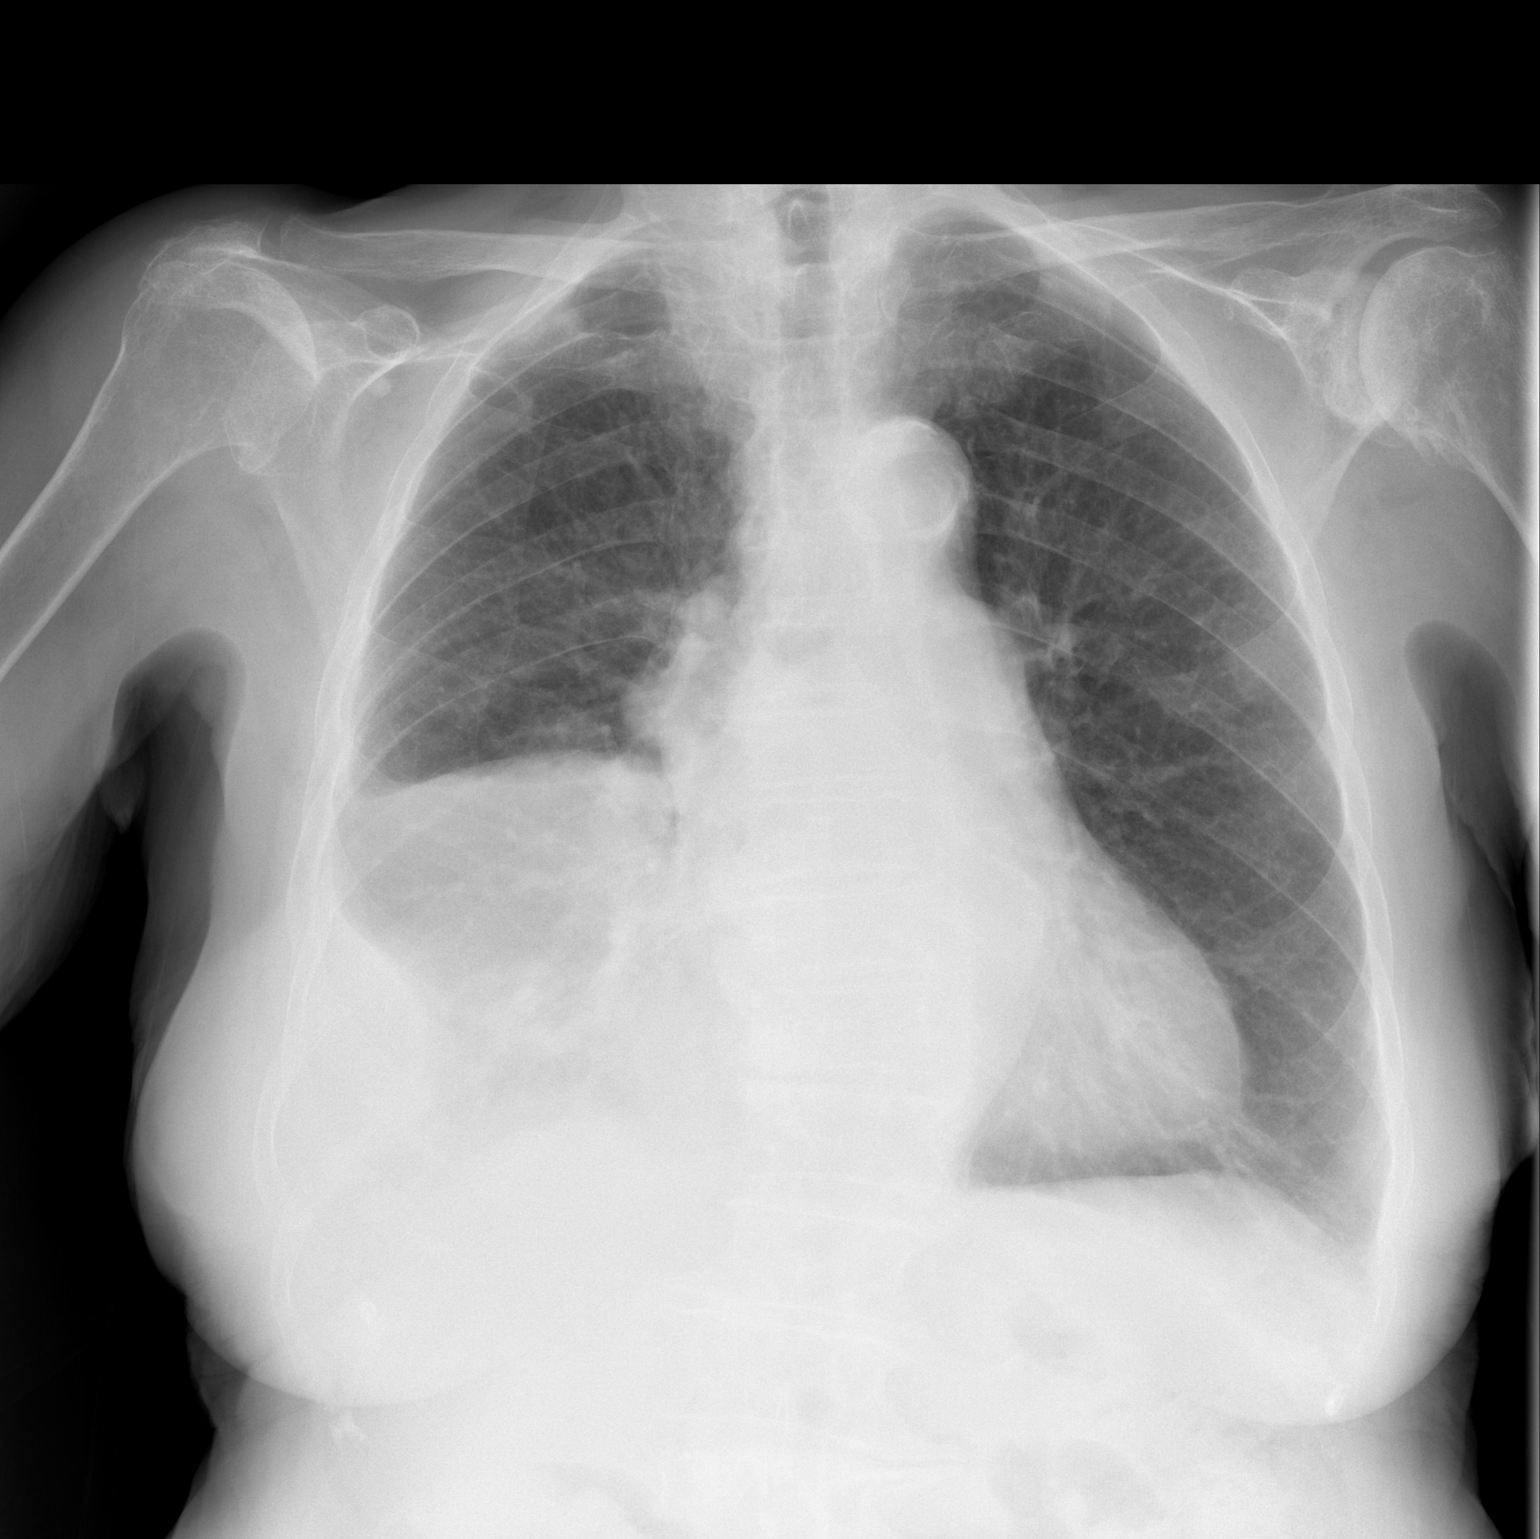

[w chest lat]
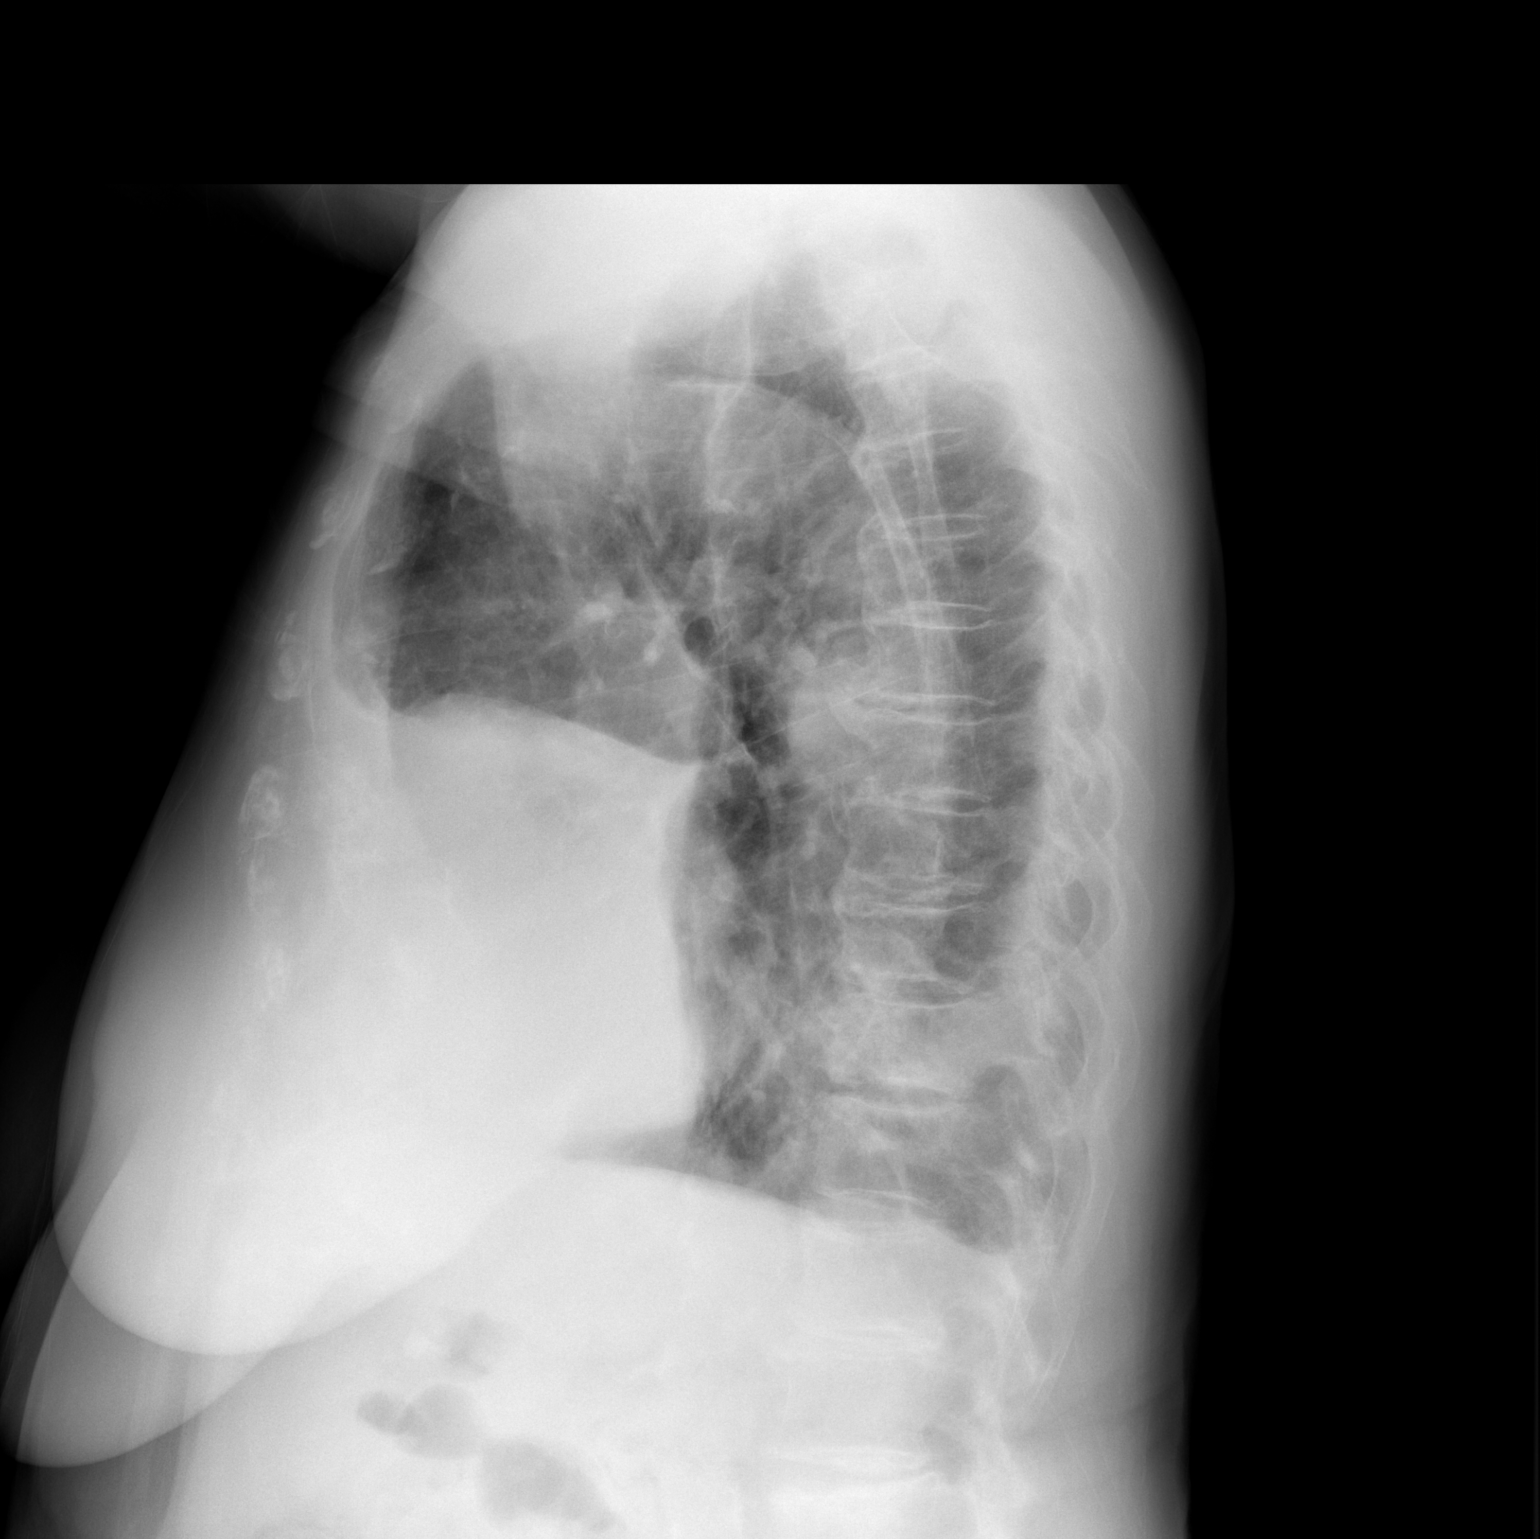

[2 of 2 positions shown; findings below may reference images not displayed]

FINDINGS: The heart is enlarged. There is aortic atherosclerosis. Left chest
is clear. On the right, there is some pleural fluid which may be
partially loculated in the inferior chest. There is right middle
lobe consolidation. Small pulmonary nodule shown by previous chest
CT are not specifically appreciated by radiography.
IMPRESSION: The patient does have some pleural fluid on the right, loculated at
the lateral base. There is right middle lobe consolidation.

## 2021-06-21 NOTE — Progress Notes (Signed)
Hematology and Oncology Follow Up Visit  Peggy Howard [id number] [birth year] 87 y.o. 06/21/2021   Principle Diagnosis:  Recurrent melanoma-right pleural effusion  Current Therapy:   Observation     Interim History:  [name] is back for her first office visit.  I saw her in the hospital at [facility] a week ago.  At that time, she actually has been in for a bladder procedure.  She did well with this.  However, she began to have some shortness of breath.  She subsequently was found to have a large right pleural effusion.  She had a chest CT scan done.  This was actually a CT angiogram.  Showed large right pleural effusion with extensive pleural-based nodularity.  There was suspicion of the mediastinal lymphadenopathy.  She had 1.4 L of fluid removed.  It was bloody fluid.  This made her breathing better.  Surprisingly enough, the cytology report ([id number]) showed malignant cells consistent with melanoma.  Her melanoma history goes back to April 2021 when she had a subungual melanoma removed from the fourth finger on her left hand.  She was discharged.  She now comes in for follow-up.  She is doing okay.  Her breathing might be a little bit worse.  We will do another chest x-ray on her to see if the fluid has accumulated.  She has had no bleeding.  She has had no problems with the bladder surgery.  There is no change in bowel or bladder habits.  She has had no headache.  When she was in the hospital, on 06/15/2021, she had a CT of the abdomen pelvis which was unremarkable for any disease below the diaphragm..  She had a CT of the brain.  This did not show any evidence of metastatic disease.  Unfortunately, the pathologist said there was not enough cells in the pleural effusion to do a BRAF analysis.  We will try to get a liquid biopsy on her today.  Her appetite is okay.  It is coming back slowly.  She has had no problems with nausea or vomiting.  There is no  cough.  Overall, her performance status is ECOG 1.  Medications:  Current Outpatient Medications:    acetaminophen (TYLENOL) 500 MG tablet, Take 1,000 mg by mouth every 6 (six) hours as needed for mild pain, fever or headache., Disp: , Rfl:    albuterol (VENTOLIN HFA) 108 (90 Base) MCG/ACT inhaler, Inhale 2 puffs into the lungs every 6 (six) hours as needed for wheezing or shortness of breath., Disp: 8 g, Rfl: 2   Ascorbic Acid (VITAMIN C PO), Take 1 tablet by mouth daily., Disp: , Rfl:    bisacodyl (DULCOLAX) 5 MG EC tablet, Take 1 tablet (5 mg total) by mouth 2 (two) times daily., Disp: 14 tablet, Rfl: 0   ferrous sulfate 325 (65 FE) MG tablet, Take 325 mg by mouth daily with breakfast., Disp: , Rfl:    fluticasone-salmeterol (ADVAIR DISKUS) 250-50 MCG/ACT AEPB, Inhale 1 puff into the lungs in the morning and at bedtime., Disp: 1 each, Rfl: 1   Multiple Vitamin (MULTIVITAMIN WITH MINERALS) TABS tablet, Take 1 tablet by mouth daily., Disp: , Rfl:    Multiple Vitamins-Minerals (PRESERVISION AREDS 2 PO), Take 1 tablet by mouth 2 (two) times daily., Disp: , Rfl:    psyllium (METAMUCIL SMOOTH TEXTURE) 58.6 % powder, Take 1 packet by mouth in the morning and at bedtime., Disp: , Rfl:    traMADol (ULTRAM) 50 MG tablet,  Take 0.5 tablets (25 mg total) by mouth every 6 (six) hours as needed. (Patient not taking: No sig reported), Disp: 10 tablet, Rfl: 0  Allergies:  Allergies  Allergen Reactions   Vitamin D Analogs     "dizziness"    Past Medical History, Surgical history, Social history, and Family History were reviewed and updated.  Review of Systems: Review of Systems  Constitutional: Negative.   HENT:  Negative.    Eyes: Negative.   Respiratory:  Positive for shortness of breath.   Cardiovascular: Negative.   Gastrointestinal: Negative.   Endocrine: Negative.   Genitourinary: Negative.    Musculoskeletal: Negative.   Skin: Negative.   Neurological: Negative.    Psychiatric/Behavioral: Negative.     Physical Exam:  weight is 161 lb 1.9 oz (73.1 kg). Her oral temperature is 97.7 F (36.5 C). Her blood pressure is 167/73 (abnormal) and her pulse is 77. Her respiration is 24 (abnormal) and oxygen saturation is 93%.   Wt Readings from Last 3 Encounters:  06/21/21 161 lb 1.9 oz (73.1 kg)  06/17/21 168 lb 11.2 oz (76.5 kg)  06/04/21 162 lb (73.5 kg)    Physical Exam Vitals reviewed.  HENT:     Head: Normocephalic and atraumatic.  Eyes:     Pupils: Pupils are equal, round, and reactive to light.  Cardiovascular:     Rate and Rhythm: Normal rate and regular rhythm.     Heart sounds: Normal heart sounds.  Pulmonary:     Effort: Pulmonary effort is normal.     Breath sounds: Normal breath sounds.     Comments: Pulmonary exam shows decent breath sounds bilaterally.  She may have some congestion and wheezes bilaterally.  She may have a little bit decreased breath sounds at the right lung base. Abdominal:     General: Bowel sounds are normal.     Palpations: Abdomen is soft.  Musculoskeletal:        General: No tenderness or deformity. Normal range of motion.     Cervical back: Normal range of motion.     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  This is the ring finger.  She has a amputation down to the proximal interphalangeal joint.  Lymphadenopathy:     Cervical: No cervical adenopathy.  Skin:    General: Skin is warm and dry.     Findings: No erythema or rash.  Neurological:     Mental Status: She is alert and oriented to person, place, and time.  Psychiatric:        Behavior: Behavior normal.        Thought Content: Thought content normal.        Judgment: Judgment normal.     Lab Results  Component Value Date   WBC 13.9 (H) 06/21/2021   HGB 12.4 06/21/2021   HCT 37.9 06/21/2021   MCV 95.2 06/21/2021   PLT 408 (H) 06/21/2021     Chemistry      Component Value Date/Time   NA 141  06/21/2021 1322   NA 144 07/16/2017 1029   K 3.9 06/21/2021 1322   CL 104 06/21/2021 1322   CO2 26 06/21/2021 1322   BUN 37 (H) 06/21/2021 1322   BUN 14 07/16/2017 1029   CREATININE 0.78 06/21/2021 1322      Component Value Date/Time   CALCIUM 9.0 06/21/2021 1322   ALKPHOS 64 06/21/2021 1322   AST 18 06/21/2021 1322   ALT 22  06/21/2021 1322   BILITOT 0.5 06/21/2021 1322       Impression and Plan: Ms. Grenz is a very charming 87 year old white female.  She has recurrent melanoma.  Again, I am not sure what the initial melanoma diagnosis was.  It was a subungual melanoma.  I am surprised that it recurred in this way.  I certainly cannot find any suspicious skin lesions that would suggest a second primary melanoma.  We will clearly have to get a PET scan on her.  I need to see the extent of her disease.  We will have to get another chest x-ray on her.  I will get this today so that we can see if there is recurrence of the pleural effusion.  Hopefully, the liquid biopsy will give [name] the BRAF study.  This really is important for [name].  As far as therapy goes, clearly, immunotherapy is going to be part of the protocol.  Whether or not we use targeted therapy will depend on the BRAF study.  She looks great.  She does not look 87.  She is quite active.  I do think we can do a lot for her.  It was nice to see her in the office.  She is clearly a strong woman.  She comes in with 2 over sons.  I answered all their questions.  We will plan to get her back once we know the PET scan results and the BRAF study.   Volanda Napoleon, MD 7/29/20222:20 PM

## 2021-06-21 NOTE — Progress Notes (Signed)
Initial RN Navigator Patient Visit  Name: Peggy Howard Date of Referral : Hospital Follow Up Diagnosis: Metastatic Melanoma  Met with patient prior to their visit with MD. Hanley Seamen patient "Your Patient Navigator" handout which explains my role, areas in which I am able to help, and all the contact information for myself and the office. Also gave patient MD and Navigator business card. Reviewed with patient the general overview of expected course after initial diagnosis and time frame for all steps to be completed.  Patient was present with her sons, Peggy Howard and Peggy Howard. They are both supportive and involved in their mom's care. Patient asks for [name] to call [name] for any needs as he is coordinating her care.   New patient packet given to patient which includes: orientation to office and staff; campus directory; education on My Chart and Advance Directives; and patient centered education on melanoma.   [place] Liquid Biopsy was drawn with patient labs at today's appointment.   Patient completed visit with Dr. Marin Olp. Once Dr Marin Olp completes his office note and places orders, I will begin to get things scheduled.   Patient understands all follow up procedures and expectations. They have my number to reach out for any further clarification or additional needs.    Oncology Nurse Navigator Documentation  Oncology Nurse Navigator Flowsheets 06/21/2021  Navigator Follow Up Date: 06/24/2021  Navigator Follow Up Reason: Appointment Review  Navigator Location [facility]  Navigator Encounter Type Initial MedOnc  Patient Visit Type MedOnc  Treatment Phase Pre-Tx/Tx Discussion  Barriers/Navigation Needs Coordination of Care;Education  Education Newly Diagnosed Cancer Education  Interventions Education;Psycho-Social Support  Acuity Level 2-Minimal Needs (1-2 Barriers Identified)  Education Method Verbal;Written  Support Groups/Services Friends and Family  Time Spent with Patient 65

## 2021-06-24 ENCOUNTER — Telehealth: Payer: Self-pay | Admitting: Hematology & Oncology

## 2021-06-24 NOTE — Telephone Encounter (Signed)
NO los 7/29

## 2021-06-25 ENCOUNTER — Telehealth: Payer: Self-pay | Admitting: *Deleted

## 2021-06-25 NOTE — Telephone Encounter (Signed)
As noted below by Dr. Marin Olp, I called the son and informed him that there is very little fluid in the right lung. He verbalized understanding.

## 2021-06-25 NOTE — Telephone Encounter (Signed)
-----   Message from Volanda Napoleon, MD sent at 06/24/2021  4:16 PM EDT ----- Please call her son and tell him that there is very little fluid in the right lung.  This is fantastic news.  Laurey Arrow

## 2021-06-26 ENCOUNTER — Encounter: Payer: Self-pay | Admitting: *Deleted

## 2021-06-26 ENCOUNTER — Telehealth: Payer: Self-pay

## 2021-06-26 NOTE — Telephone Encounter (Signed)
Received VM from pt's daughter in law, Ria Comment that pt is extremely SOB and having difficulty breathing. Called Ria Comment back within 30 minutes to advise her per Dr Marin Olp to transport pt to the ED. Ria Comment is concerned that fluid has re accumulated in lungs stating that pt can't go even 5 steps with assistance before struggling to breathe. Will take pt to [facility]. dph

## 2021-06-26 NOTE — Progress Notes (Signed)
PET scheduled for 06/28/2021  Oncology Nurse Navigator Documentation  Oncology Nurse Navigator Flowsheets 06/26/2021  Navigator Follow Up Date: 07/02/2021  Navigator Follow Up Reason: Scan Review  Navigator Location [facility]  Navigator Encounter Type Appt/Treatment Plan Review  Patient Visit Type MedOnc  Treatment Phase Pre-Tx/Tx Discussion  Barriers/Navigation Needs Coordination of Care;Education  Education -  Interventions None Required  Acuity Level 2-Minimal Needs (1-2 Barriers Identified)  Education Method -  Support Groups/Services Friends and Family  Time Spent with Patient 15

## 2021-06-27 ENCOUNTER — Telehealth: Payer: Self-pay | Admitting: Acute Care

## 2021-06-27 ENCOUNTER — Encounter (HOSPITAL_COMMUNITY): Payer: Self-pay | Admitting: Oncology

## 2021-06-27 ENCOUNTER — Inpatient Hospital Stay (HOSPITAL_COMMUNITY)
Admission: EM | Admit: 2021-06-27 | Discharge: 2021-06-29 | DRG: 180 | Disposition: A | Discharge status: Home Health | Payer: Medicare Other | Attending: Student | Admitting: Student

## 2021-06-27 ENCOUNTER — Inpatient Hospital Stay (HOSPITAL_COMMUNITY): Payer: Medicare Other

## 2021-06-27 ENCOUNTER — Emergency Department (HOSPITAL_COMMUNITY): Payer: Medicare Other

## 2021-06-27 ENCOUNTER — Other Ambulatory Visit: Payer: Self-pay

## 2021-06-27 DIAGNOSIS — J449 Chronic obstructive pulmonary disease, unspecified: Secondary | ICD-10-CM | POA: Diagnosis present

## 2021-06-27 DIAGNOSIS — Z96653 Presence of artificial knee joint, bilateral: Secondary | ICD-10-CM | POA: Diagnosis present

## 2021-06-27 DIAGNOSIS — Z20822 Contact with and (suspected) exposure to covid-19: Secondary | ICD-10-CM | POA: Diagnosis present

## 2021-06-27 DIAGNOSIS — D72825 Bandemia: Secondary | ICD-10-CM | POA: Diagnosis present

## 2021-06-27 DIAGNOSIS — C7800 Secondary malignant neoplasm of unspecified lung: Secondary | ICD-10-CM | POA: Diagnosis present

## 2021-06-27 DIAGNOSIS — Z66 Do not resuscitate: Secondary | ICD-10-CM | POA: Diagnosis present

## 2021-06-27 DIAGNOSIS — J910 Malignant pleural effusion: Secondary | ICD-10-CM | POA: Diagnosis present

## 2021-06-27 DIAGNOSIS — Z89022 Acquired absence of left finger(s): Secondary | ICD-10-CM | POA: Diagnosis not present

## 2021-06-27 DIAGNOSIS — Z85820 Personal history of malignant melanoma of skin: Secondary | ICD-10-CM

## 2021-06-27 DIAGNOSIS — J90 Pleural effusion, not elsewhere classified: Secondary | ICD-10-CM

## 2021-06-27 DIAGNOSIS — R0603 Acute respiratory distress: Secondary | ICD-10-CM

## 2021-06-27 DIAGNOSIS — C7801 Secondary malignant neoplasm of right lung: Secondary | ICD-10-CM | POA: Diagnosis present

## 2021-06-27 DIAGNOSIS — Z9981 Dependence on supplemental oxygen: Secondary | ICD-10-CM | POA: Diagnosis not present

## 2021-06-27 DIAGNOSIS — J860 Pyothorax with fistula: Secondary | ICD-10-CM | POA: Diagnosis not present

## 2021-06-27 DIAGNOSIS — C439 Malignant melanoma of skin, unspecified: Secondary | ICD-10-CM | POA: Diagnosis not present

## 2021-06-27 DIAGNOSIS — T380X5A Adverse effect of glucocorticoids and synthetic analogues, initial encounter: Secondary | ICD-10-CM | POA: Diagnosis present

## 2021-06-27 DIAGNOSIS — Z87891 Personal history of nicotine dependence: Secondary | ICD-10-CM | POA: Diagnosis not present

## 2021-06-27 DIAGNOSIS — R7303 Prediabetes: Secondary | ICD-10-CM | POA: Diagnosis present

## 2021-06-27 DIAGNOSIS — J9600 Acute respiratory failure, unspecified whether with hypoxia or hypercapnia: Secondary | ICD-10-CM | POA: Diagnosis present

## 2021-06-27 DIAGNOSIS — J9601 Acute respiratory failure with hypoxia: Secondary | ICD-10-CM | POA: Diagnosis present

## 2021-06-27 DIAGNOSIS — J9811 Atelectasis: Secondary | ICD-10-CM | POA: Diagnosis not present

## 2021-06-27 LAB — COMPREHENSIVE METABOLIC PANEL
ALT: 14 U/L (ref 0–44)
AST: 25 U/L (ref 15–41)
Albumin: 3.4 g/dL — ABNORMAL LOW (ref 3.5–5.0)
Alkaline Phosphatase: 57 U/L (ref 38–126)
Anion gap: 9 (ref 5–15)
BUN: 27 mg/dL — ABNORMAL HIGH (ref 8–23)
CO2: 26 mmol/L (ref 22–32)
Calcium: 8.9 mg/dL (ref 8.9–10.3)
Chloride: 104 mmol/L (ref 98–111)
Creatinine, Ser: 0.76 mg/dL (ref 0.44–1.00)
GFR, Estimated: >60 mL/min (ref >60)
Glucose, Bld: 154 mg/dL — ABNORMAL HIGH (ref 70–99)
Potassium: 4.3 mmol/L (ref 3.5–5.1)
Sodium: 139 mmol/L (ref 135–145)
Total Bilirubin: 0.8 mg/dL (ref 0.3–1.2)
Total Protein: 6.5 g/dL (ref 6.5–8.1)

## 2021-06-27 LAB — CREATININE, SERUM
Creatinine, Ser: 0.68 mg/dL (ref 0.44–1.00)
GFR, Estimated: >60 mL/min (ref >60)

## 2021-06-27 LAB — CBC WITH DIFFERENTIAL/PLATELET
Abs Immature Granulocytes: 0.1 10*3/uL — ABNORMAL HIGH (ref 0.00–0.07)
Basophils Absolute: 0.1 10*3/uL (ref 0.0–0.1)
Basophils Relative: 1 %
Eosinophils Absolute: 0.1 10*3/uL (ref 0.0–0.5)
Eosinophils Relative: 1 %
HCT: 38.9 % (ref 36.0–46.0)
Hemoglobin: 12.6 g/dL (ref 12.0–15.0)
Immature Granulocytes: 1 %
Lymphocytes Relative: 12 %
Lymphs Abs: 1.3 10*3/uL (ref 0.7–4.0)
MCH: 31.1 pg (ref 26.0–34.0)
MCHC: 32.4 g/dL (ref 30.0–36.0)
MCV: 96 fL (ref 80.0–100.0)
Monocytes Absolute: 0.8 10*3/uL (ref 0.1–1.0)
Monocytes Relative: 7 %
Neutro Abs: 9.1 10*3/uL — ABNORMAL HIGH (ref 1.7–7.7)
Neutrophils Relative %: 78 %
Platelets: 487 10*3/uL — ABNORMAL HIGH (ref 150–400)
RBC: 4.05 MIL/uL (ref 3.87–5.11)
RDW: 13.8 % (ref 11.5–15.5)
WBC: 11.5 10*3/uL — ABNORMAL HIGH (ref 4.0–10.5)
nRBC: 0 % (ref 0.0–0.2)

## 2021-06-27 LAB — CBC
HCT: 39.6 % (ref 36.0–46.0)
Hemoglobin: 12.7 g/dL (ref 12.0–15.0)
MCH: 30.9 pg (ref 26.0–34.0)
MCHC: 32.1 g/dL (ref 30.0–36.0)
MCV: 96.4 fL (ref 80.0–100.0)
Platelets: 439 10*3/uL — ABNORMAL HIGH (ref 150–400)
RBC: 4.11 MIL/uL (ref 3.87–5.11)
RDW: 13.8 % (ref 11.5–15.5)
WBC: 8.2 10*3/uL (ref 4.0–10.5)
nRBC: 0 % (ref 0.0–0.2)

## 2021-06-27 LAB — BLOOD GAS, VENOUS
Acid-Base Excess: 1.2 mmol/L (ref 0.0–2.0)
Bicarbonate: 25.2 mmol/L (ref 20.0–28.0)
FIO2: 21
O2 Saturation: 62.9 %
Patient temperature: 98.6
pCO2, Ven: 39.7 mmHg — ABNORMAL LOW (ref 44.0–60.0)
pH, Ven: 7.419 (ref 7.250–7.430)
pO2, Ven: 35.4 mmHg (ref 32.0–45.0)

## 2021-06-27 LAB — I-STAT CHEM 8, ED
BUN: 25 mg/dL — ABNORMAL HIGH (ref 8–23)
Calcium, Ion: 1.15 mmol/L (ref 1.15–1.40)
Chloride: 104 mmol/L (ref 98–111)
Creatinine, Ser: 0.7 mg/dL (ref 0.44–1.00)
Glucose, Bld: 157 mg/dL — ABNORMAL HIGH (ref 70–99)
HCT: 37 % (ref 36.0–46.0)
Hemoglobin: 12.6 g/dL (ref 12.0–15.0)
Potassium: 4.1 mmol/L (ref 3.5–5.1)
Sodium: 138 mmol/L (ref 135–145)
TCO2: 26 mmol/L (ref 22–32)

## 2021-06-27 LAB — HEMOGLOBIN A1C
Hgb A1c MFr Bld: 6.2 % — ABNORMAL HIGH (ref 4.8–5.6)
Mean Plasma Glucose: 131.24 mg/dL

## 2021-06-27 LAB — MRSA NEXT GEN BY PCR, NASAL: MRSA by PCR Next Gen: NOT DETECTED

## 2021-06-27 LAB — RESP PANEL BY RT-PCR (FLU A&B, COVID) ARPGX2
Influenza A by PCR: NEGATIVE
Influenza B by PCR: NEGATIVE
SARS Coronavirus 2 by RT PCR: NEGATIVE

## 2021-06-27 LAB — PROCALCITONIN: Procalcitonin: <0.1 ng/mL

## 2021-06-27 LAB — BRAIN NATRIURETIC PEPTIDE: B Natriuretic Peptide: 35.5 pg/mL (ref 0.0–100.0)

## 2021-06-27 LAB — POC SARS CORONAVIRUS 2 AG -  ED: SARSCOV2ONAVIRUS 2 AG: NEGATIVE

## 2021-06-27 IMAGING — DX DG CHEST 1V PORT
1 series · 1 of 1 positions shown · non-contrast
Comparison: Single-view of the chest [DATE].

CLINICAL DATA: Shortness of breath in a patient with a history of
metastatic melanoma.

EXAM:
PORTABLE CHEST 1 VIEW

[chest ap]
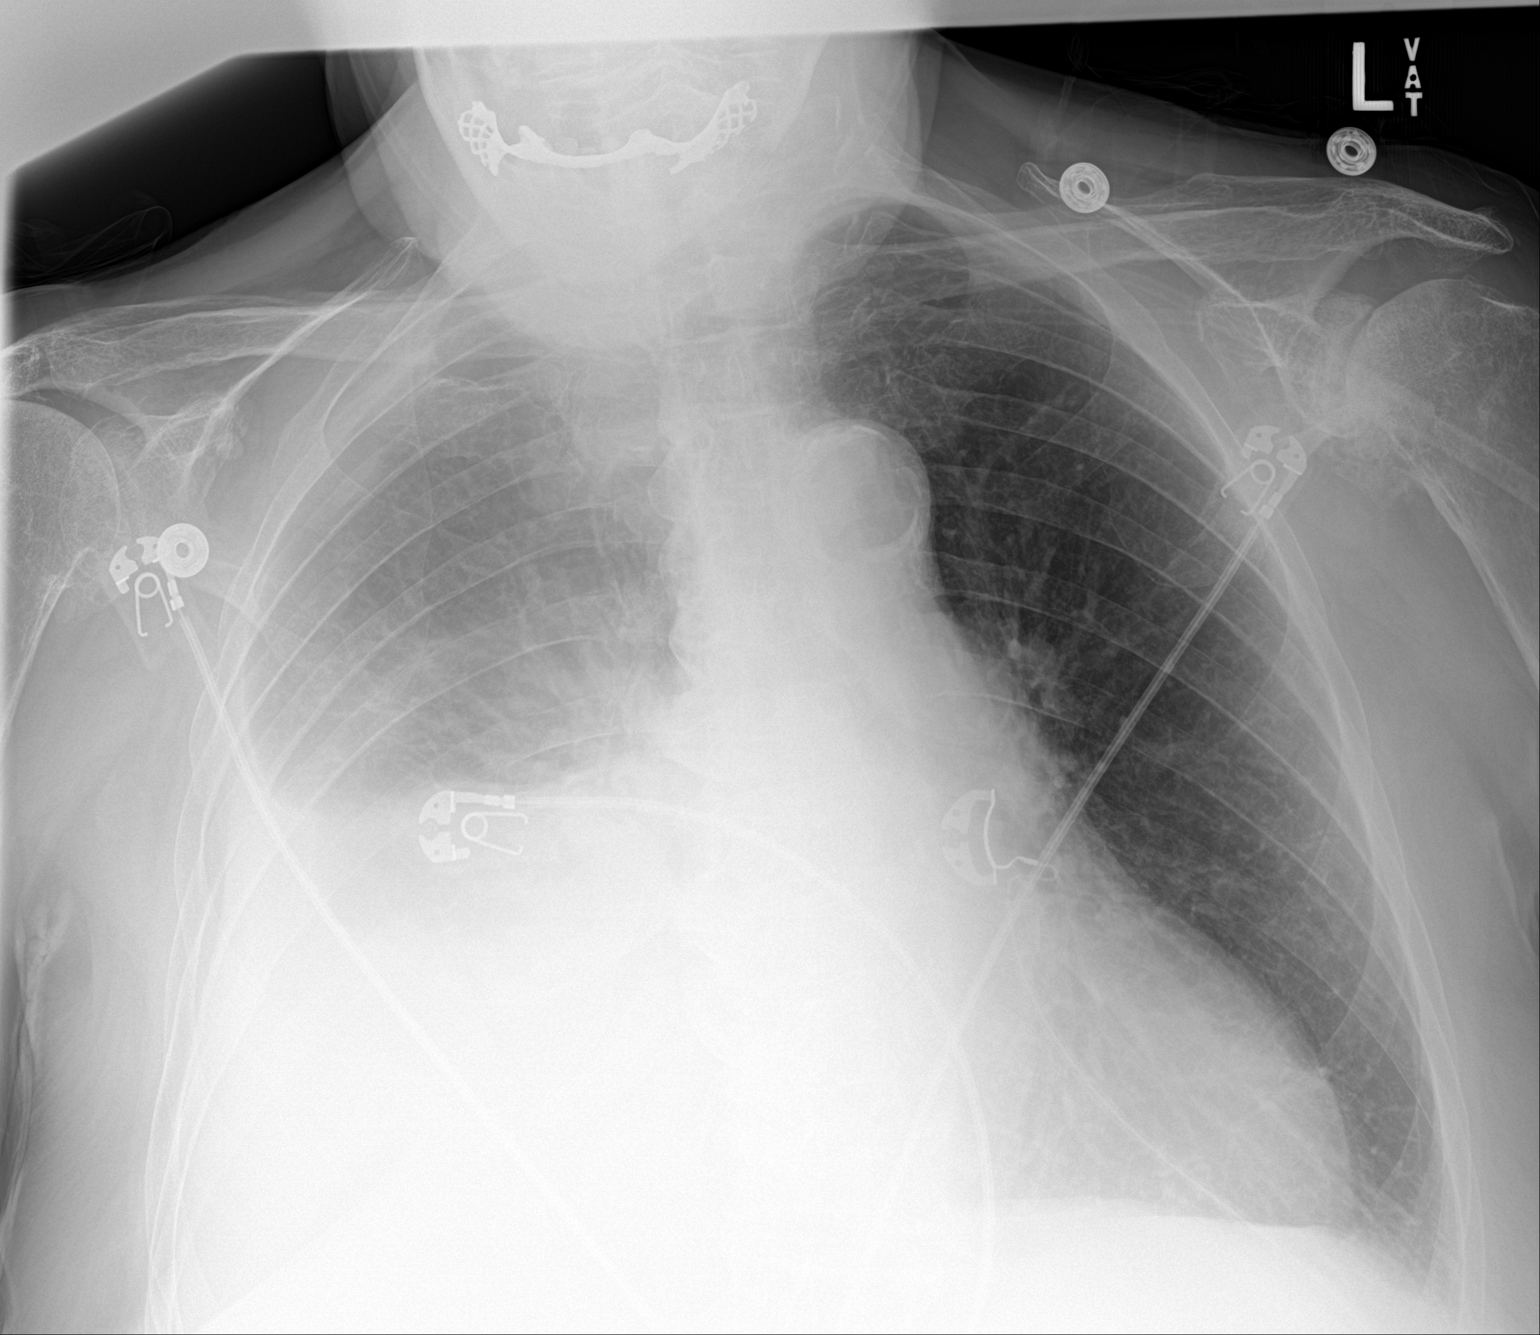

[1 of 1 positions shown; findings below may reference images not displayed]

FINDINGS: The patient has a large right pleural effusion which has worsened
since the prior exam. Associated airspace opacity is present in the
right mid and lower lung zones. The left lung appears clear. Cardiac
silhouette is largely obscured. Aortic atherosclerosis noted.
IMPRESSION: Large right pleural effusion has worsened since the prior exam.
Right basilar airspace disease appears unchanged.

## 2021-06-27 IMAGING — DX DG CHEST 1V PORT
1 series · 1 of 1 positions shown · non-contrast
Comparison: Earlier same day.  [DATE].

CLINICAL DATA: Pleural effusion with worsening shortness of breath.

EXAM:
PORTABLE CHEST 1 VIEW

[chest ap]
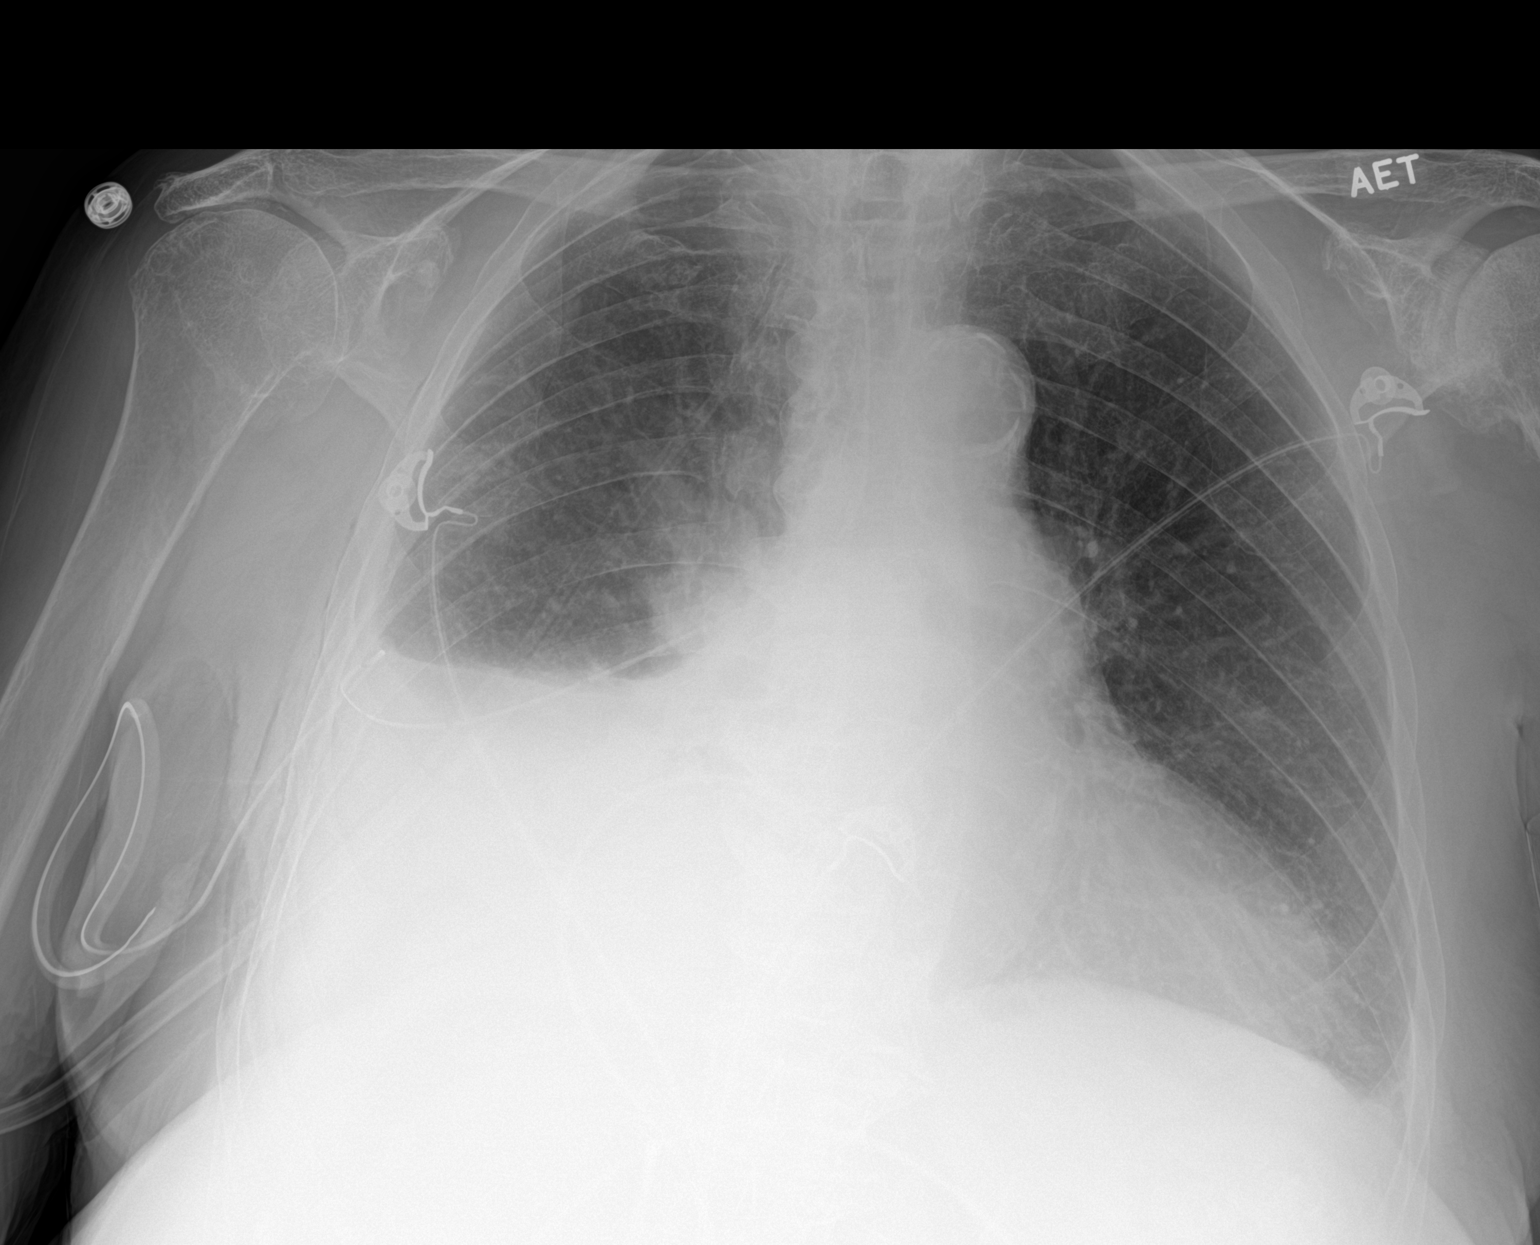

[1 of 1 positions shown; findings below may reference images not displayed]

FINDINGS: Left chest remains clear. Right chest tube in place. Slightly less
pleural fluid on the right than was seen previously. Mild
cardiomegaly and aortic atherosclerosis as seen previously. No
pneumothorax.
IMPRESSION: Right chest tube in place. Slightly less pleural fluid than was seen
previously in the right hemithorax. No pneumothorax. Left chest
remains clear.

## 2021-06-27 MED ORDER — SODIUM CHLORIDE 0.9 % IV SOLN
500.0000 mg | INTRAVENOUS | Status: DC
  Administered 2021-06-27: 500 mg via INTRAVENOUS
  Filled 2021-06-27 (×3): qty 500

## 2021-06-27 MED ORDER — ENOXAPARIN SODIUM 40 MG/0.4ML IJ SOSY
40.0000 mg | PREFILLED_SYRINGE | INTRAMUSCULAR | Status: DC
  Administered 2021-06-27 – 2021-06-28 (×2): 40 mg via SUBCUTANEOUS
  Filled 2021-06-27 (×2): qty 0.4

## 2021-06-27 MED ORDER — SODIUM CHLORIDE 0.9 % IV SOLN
INTRAVENOUS | Status: DC | PRN
Start: 2021-06-27 — End: 2021-06-29
  Administered 2021-06-27: 250 mL via INTRAVENOUS

## 2021-06-27 MED ORDER — ORAL CARE MOUTH RINSE
15.0000 mL | Freq: Two times a day (BID) | OROMUCOSAL | Status: DC
  Administered 2021-06-27 – 2021-06-28 (×4): 15 mL via OROMUCOSAL

## 2021-06-27 MED ORDER — ALBUTEROL SULFATE (2.5 MG/3ML) 0.083% IN NEBU
INHALATION_SOLUTION | RESPIRATORY_TRACT | Status: AC
  Administered 2021-06-27: 2.5 mg
  Filled 2021-06-27: qty 3

## 2021-06-27 MED ORDER — METHYLPREDNISOLONE SODIUM SUCC 125 MG IJ SOLR
125.0000 mg | Freq: Once | INTRAMUSCULAR | Status: AC
  Administered 2021-06-27: 125 mg via INTRAVENOUS
  Filled 2021-06-27: qty 2

## 2021-06-27 MED ORDER — PSYLLIUM 95 % PO PACK
1.0000 | PACK | Freq: Two times a day (BID) | ORAL | Status: DC
  Administered 2021-06-27 – 2021-06-29 (×4): 1 via ORAL
  Filled 2021-06-27 (×4): qty 1

## 2021-06-27 MED ORDER — PROSIGHT PO TABS
1.0000 | ORAL_TABLET | Freq: Two times a day (BID) | ORAL | Status: DC
  Administered 2021-06-27 – 2021-06-29 (×4): 1 via ORAL
  Filled 2021-06-27 (×4): qty 1

## 2021-06-27 MED ORDER — ALBUTEROL SULFATE (2.5 MG/3ML) 0.083% IN NEBU
2.5000 mg | INHALATION_SOLUTION | Freq: Two times a day (BID) | RESPIRATORY_TRACT | Status: DC
  Administered 2021-06-27 – 2021-06-29 (×4): 2.5 mg via RESPIRATORY_TRACT
  Filled 2021-06-27 (×4): qty 3

## 2021-06-27 MED ORDER — ACETAMINOPHEN 325 MG PO TABS
650.0000 mg | ORAL_TABLET | Freq: Four times a day (QID) | ORAL | Status: DC | PRN
  Administered 2021-06-27 – 2021-06-29 (×3): 650 mg via ORAL
  Filled 2021-06-27 (×3): qty 2

## 2021-06-27 MED ORDER — IPRATROPIUM-ALBUTEROL 0.5-2.5 (3) MG/3ML IN SOLN
3.0000 mL | Freq: Once | RESPIRATORY_TRACT | Status: AC
  Administered 2021-06-27: 3 mL via RESPIRATORY_TRACT
  Filled 2021-06-27: qty 3

## 2021-06-27 MED ORDER — FERROUS SULFATE 325 (65 FE) MG PO TABS
325.0000 mg | ORAL_TABLET | Freq: Every day | ORAL | Status: DC
  Administered 2021-06-28 – 2021-06-29 (×2): 325 mg via ORAL
  Filled 2021-06-27 (×2): qty 1

## 2021-06-27 MED ORDER — IPRATROPIUM-ALBUTEROL 0.5-2.5 (3) MG/3ML IN SOLN: 3.0000 mL | Freq: Four times a day (QID) | RESPIRATORY_TRACT | Status: DC | PRN

## 2021-06-27 MED ORDER — HYDROCODONE-ACETAMINOPHEN 5-325 MG PO TABS: 1.0000 | ORAL_TABLET | ORAL | Status: DC | PRN

## 2021-06-27 MED ORDER — PREDNISONE 5 MG PO TABS
50.0000 mg | ORAL_TABLET | Freq: Every day | ORAL | Status: DC
  Administered 2021-06-28 – 2021-06-29 (×2): 50 mg via ORAL
  Filled 2021-06-27 (×2): qty 2

## 2021-06-27 MED ORDER — CHLORHEXIDINE GLUCONATE CLOTH 2 % EX PADS
6.0000 | MEDICATED_PAD | Freq: Every day | CUTANEOUS | Status: DC
  Administered 2021-06-27 – 2021-06-29 (×3): 6 via TOPICAL

## 2021-06-27 MED ORDER — SODIUM CHLORIDE 0.9 % IV SOLN
2.0000 g | INTRAVENOUS | Status: DC
  Administered 2021-06-27 – 2021-06-28 (×2): 2 g via INTRAVENOUS
  Filled 2021-06-27: qty 2
  Filled 2021-06-27 (×2): qty 20

## 2021-06-27 NOTE — ED Provider Notes (Signed)
[facility] DEPT Provider Note   CSN: [id number] Arrival date & time: 06/27/21  0857     History Chief Complaint  Patient presents with   Respiratory Distress    Peggy Howard is a 87 y.o. female.  HPI Patient is an 87 year old female who presents for dyspnea.  Dyspnea is worsened with exertion.  He has progressed over the past 3 days in severity and she is now dyspneic at rest.  She currently lives at home alone.  She arrived in the hospital via [place].  She was recently prescribed morning inhaler use.  She does not use home oxygen.  Medical history is as follows.  She was in good health up until 2 years ago.  Years ago she was found to have a subungual melanoma on her left fourth digit.  Left digit underwent amputation.  She was hospitalized 2 weeks ago for a bladder procedure.  At this time, there was finding of pleural effusion.  She underwent a diagnostic/therapeutic pleurocentesis.  1.4 L of bloody fluid was removed from right lung.  Lab results showed malignant cells consistent with metastatic melanoma.  She was discharged on 7/25.  She has since followed up with oncology.  No chemo or immuno-therapy has been initiated thus far.    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   Goals of care, counseling/discussion 06/21/2021   Rectal prolapse 06/12/2021   GIB (gastrointestinal bleeding) 03/26/2021   BRBPR (bright red blood per rectum) 03/25/2021   Abdominal pain 03/25/2021   Low back pain 03/25/2021   Overweight (BMI 25.0-29.9) 07/16/2017   Colonoscopy refused 07/16/2017   Refuses treatment  07/16/2017   Parent refuses immunizations 07/16/2017   Patient refuses to take medication 07/16/2017   History of cigarette smoking 06/26/2017   Other fatigue 06/26/2017   Memory changes 06/26/2017   Community acquired pneumonia of right middle lobe of lung    External hemorrhoids 01/26/2008    Past Surgical History:  Procedure Laterality Date   ABDOMINAL HYSTERECTOMY     amputation of left finger      2021   BIOPSY  03/27/2021   Procedure: BIOPSY;  Surgeon: Irving Copas., MD;  Location: [facility];  Service: Gastroenterology;;   COLONOSCOPY WITH PROPOFOL N/A 03/27/2021   Procedure: COLONOSCOPY WITH PROPOFOL;  Surgeon: Irving Copas., MD;  Location: [facility];  Service: Gastroenterology;  Laterality: N/A;   JOINT REPLACEMENT     POLYPECTOMY  03/27/2021   Procedure: POLYPECTOMY;  Surgeon: Mansouraty, Telford Nab., MD;  Location: [facility];  Service: Gastroenterology;;   REPLACEMENT TOTAL KNEE Bilateral    XI ROBOT ASSISTED RECTOPEXY N/A 06/12/2021   Procedure: XI ROBOT ASSISTED RECTOPEXY;  Surgeon: Leighton Ruff, MD;  Location: [facility];  Service: General;  Laterality: N/A;     OB History   No obstetric history on file.     No family history on file.  Social History   Tobacco Use   Smoking status: Former    Packs/day: 2.00    Years: 25.00    Pack years: 50.00    Types: Cigarettes    Quit date: 1977  Years since quitting: 45.6   Smokeless tobacco: Never  Vaping Use   Vaping Use: Never used  Substance Use Topics   Alcohol use: No   Drug use: Yes    Types: Benzodiazepines    Comment: Denies any use. 06/21/21    Home Medications Prior to Admission medications   Medication Sig Start Date End Date Taking? Authorizing Provider  acetaminophen (TYLENOL) 500 MG tablet Take 1,000 mg by mouth every 6 (six) hours as needed for mild pain, fever or headache.   Yes [provider]  albuterol (VENTOLIN HFA) 108 (90 Base) MCG/ACT inhaler Inhale  2 puffs into the lungs every 6 (six) hours as needed for wheezing or shortness of breath. 06/16/21  Yes Gonfa, Taye T, MD  fluticasone-salmeterol (ADVAIR DISKUS) 250-50 MCG/ACT AEPB Inhale 1 puff into the lungs in the morning and at bedtime. 06/17/21  Yes Mercy Riding, MD  Multiple Vitamins-Minerals (PRESERVISION AREDS 2 PO) Take 1 tablet by mouth 2 (two) times daily.   Yes [provider]  psyllium (METAMUCIL SMOOTH TEXTURE) 58.6 % powder Take 1 packet by mouth in the morning and at bedtime. 8/36/62  Yes Leighton Ruff, MD  ferrous sulfate 325 (65 FE) MG tablet Take 325 mg by mouth daily with breakfast.    [provider]    Allergies    Vitamin d analogs  Review of Systems   Review of Systems  Constitutional:  Positive for activity change and fatigue. Negative for chills and fever.  HENT:  Negative for congestion, ear pain, rhinorrhea and sore throat.   Eyes:  Negative for pain and visual disturbance.  Respiratory:  Positive for chest tightness, shortness of breath and wheezing. Negative for cough.   Cardiovascular:  Negative for chest pain, palpitations and leg swelling.  Gastrointestinal:  Negative for abdominal pain, diarrhea, nausea and vomiting.  Genitourinary:  Negative for dysuria, hematuria and pelvic pain.  Musculoskeletal:  Negative for arthralgias, back pain, myalgias and neck pain.  Skin:  Negative for color change and rash.  Neurological:  Negative for dizziness, seizures, syncope, speech difficulty, weakness, light-headedness and headaches.  Psychiatric/Behavioral:  Negative for confusion and decreased concentration.   All other systems reviewed and are negative.  Physical Exam Updated Vital Signs BP (!) 125/59   Pulse 85   Temp 98.3 F (36.8 C) (Oral)   Resp (!) 27   Ht 5\' 4"  (1.626 m)   Wt 73 kg   SpO2 91%   BMI 27.64 kg/m   Physical Exam Vitals and nursing note reviewed.  Constitutional:      Appearance: She is well-developed and normal  weight. She is ill-appearing. She is not toxic-appearing or diaphoretic.  HENT:     Head: Normocephalic and atraumatic.     Right Ear: External ear normal.     Left Ear: External ear normal.     Nose: Nose normal.     Mouth/Throat:     Mouth: Mucous membranes are moist.  Eyes:     Conjunctiva/sclera: Conjunctivae normal.  Cardiovascular:     Rate and Rhythm: Normal rate and regular rhythm.     Heart sounds: No murmur heard. Pulmonary:     Effort: Respiratory distress present.     Breath sounds: Wheezing present. No rales.  Chest:     Chest wall: No tenderness.  Abdominal:     Palpations: Abdomen is soft.     Tenderness: There is no abdominal tenderness. There is no guarding.  Musculoskeletal:  General: No swelling.     Cervical back: Neck supple. No rigidity or tenderness.     Right lower leg: No edema.     Left lower leg: No edema.  Skin:    General: Skin is warm and dry.     Coloration: Skin is not jaundiced or pale.  Neurological:     General: No focal deficit present.     Mental Status: She is alert and oriented to person, place, and time.     Cranial Nerves: No cranial nerve deficit.     Sensory: No sensory deficit.     Motor: No weakness.  Psychiatric:        Mood and Affect: Mood normal.        Behavior: Behavior normal.        Thought Content: Thought content normal.        Judgment: Judgment normal.    ED Results / Procedures / Treatments   Labs (all labs ordered are listed, but only abnormal results are displayed) Labs Reviewed  COMPREHENSIVE METABOLIC PANEL - Abnormal; Notable for the following components:      Result Value   Glucose, Bld 154 (*)    BUN 27 (*)    Albumin 3.4 (*)    All other components within normal limits  CBC WITH DIFFERENTIAL/PLATELET - Abnormal; Notable for the following components:   WBC 11.5 (*)    Platelets 487 (*)    Neutro Abs 9.1 (*)    Abs Immature Granulocytes 0.10 (*)    All other components within normal limits   BLOOD GAS, VENOUS - Abnormal; Notable for the following components:   pCO2, Ven 39.7 (*)    All other components within normal limits  CBC - Abnormal; Notable for the following components:   Platelets 439 (*)    All other components within normal limits  I-STAT CHEM 8, ED - Abnormal; Notable for the following components:   BUN 25 (*)    Glucose, Bld 157 (*)    All other components within normal limits  RESP PANEL BY RT-PCR (FLU A&B, COVID) ARPGX2  MRSA NEXT GEN BY PCR, NASAL  MRSA NEXT GEN BY PCR, NASAL  BRAIN NATRIURETIC PEPTIDE  CREATININE, SERUM  LEGIONELLA PNEUMOPHILA SEROGP 1 UR AG  STREP PNEUMONIAE URINARY ANTIGEN  PROCALCITONIN  HEMOGLOBIN A1C  COMPREHENSIVE METABOLIC PANEL  CBC  PROCALCITONIN  POC SARS CORONAVIRUS 2 AG -  ED    EKG EKG Interpretation  Date/Time:  Thursday June 27 2021 09:11:50 EDT Ventricular Rate:  88 PR Interval:  139 QRS Duration: 81 QT Interval:  382 QTC Calculation: 463 R Axis:   73 Text Interpretation: Sinus rhythm Borderline T abnormalities, anterior leads Confirmed by Godfrey Pick ([id number]) on 06/27/2021 9:50:19 AM  Radiology DG CHEST PORT 1 VIEW  Result Date: 06/27/2021 CLINICAL DATA:  Pleural effusion with worsening shortness of breath. EXAM: PORTABLE CHEST 1 VIEW COMPARISON:  Earlier same day.  06/21/2021. FINDINGS: Left chest remains clear. Right chest tube in place. Slightly less pleural fluid on the right than was seen previously. Mild cardiomegaly and aortic atherosclerosis as seen previously. No pneumothorax. IMPRESSION: Right chest tube in place. Slightly less pleural fluid than was seen previously in the right hemithorax. No pneumothorax. Left chest remains clear. Electronically Signed   By: Nelson Chimes M.D.   On: 06/27/2021 16:14   DG Chest Port 1 View  Result Date: 06/27/2021 CLINICAL DATA:  Shortness of breath in a patient with a history of metastatic melanoma.  EXAM: PORTABLE CHEST 1 VIEW COMPARISON:  Single-view of the chest  06/21/2021. FINDINGS: The patient has a large right pleural effusion which has worsened since the prior exam. Associated airspace opacity is present in the right mid and lower lung zones. The left lung appears clear. Cardiac silhouette is largely obscured. Aortic atherosclerosis noted. IMPRESSION: Large right pleural effusion has worsened since the prior exam. Right basilar airspace disease appears unchanged. Electronically Signed   By: Inge Rise M.D.   On: 06/27/2021 10:03    Procedures Procedures   Medications Ordered in ED Medications  ferrous sulfate tablet 325 mg (has no administration in time range)  psyllium (HYDROCIL/METAMUCIL) 1 packet (has no administration in time range)  multivitamin (PROSIGHT) tablet 1 tablet (has no administration in time range)  enoxaparin (LOVENOX) injection 40 mg (has no administration in time range)  cefTRIAXone (ROCEPHIN) 2 g in sodium chloride 0.9 % 100 mL IVPB (2 g Intravenous New Bag/Given 06/27/21 1658)  azithromycin (ZITHROMAX) 500 mg in sodium chloride 0.9 % 250 mL IVPB (has no administration in time range)  ipratropium-albuterol (DUONEB) 0.5-2.5 (3) MG/3ML nebulizer solution 3 mL (has no administration in time range)  predniSONE (DELTASONE) tablet 50 mg (has no administration in time range)  Chlorhexidine Gluconate Cloth 2 % PADS 6 each (6 each Topical Given 06/27/21 1226)  MEDLINE mouth rinse (15 mLs Mouth Rinse Given 06/27/21 1226)  albuterol (PROVENTIL) (2.5 MG/3ML) 0.083% nebulizer solution 2.5 mg (2.5 mg Nebulization Not Given 06/27/21 1242)  HYDROcodone-acetaminophen (NORCO/VICODIN) 5-325 MG per tablet 1-2 tablet (has no administration in time range)  0.9 %  sodium chloride infusion (250 mLs Intravenous New Bag/Given 06/27/21 1654)  ipratropium-albuterol (DUONEB) 0.5-2.5 (3) MG/3ML nebulizer solution 3 mL (3 mLs Nebulization Given 06/27/21 0932)  methylPREDNISolone sodium succinate (SOLU-MEDROL) 125 mg/2 mL injection 125 mg (125 mg Intravenous Given  06/27/21 0929)  albuterol (PROVENTIL) (2.5 MG/3ML) 0.083% nebulizer solution (2.5 mg  Given 06/27/21 1242)    ED Course  I have reviewed the triage vital signs and the nursing notes.  Pertinent labs & imaging results that were available during my care of the patient were reviewed by me and considered in my medical decision making (see chart for details).    MDM Rules/Calculators/A&P                           Patient is a 52-year-old female with recent diagnosis of metastatic melanoma, presenting for 3 days of worsening dyspnea.  Dyspnea is initially present with exertion.  It has progressed to the point of dyspnea at rest.  She denies any previous history of lung disease.  She was recently started on morning inhalers.  She is not on home oxygen.  Today, she has diffuse wheezing and diminished breath sounds throughout all lung fields.  SPO2 was in the mid to high 80s on room air.  Patient has obvious increased work of breathing.  She is alert and oriented upon arrival.  Rapid COVID test was ordered in preparation for DuoNeb breathing treatments.  Solu-Medrol was ordered.  Laboratory and chest x-ray diagnostic studies were initiated.  Chest x-ray showed recurrence of right-sided pleural effusion.  Following breathing treatment, patient did endorse improved breathing.  She remained on 2 L of supplemental oxygen by nasal cannula.  She was now able to talk in full sentences.  Given her recurrence of pleural effusion, new dyspnea, and new hypoxia, patient to be admitted.  She was admitted to hospitalist service  for further management.  Final Clinical Impression(s) / ED Diagnoses Final diagnoses:  Pleural effusion  Acute respiratory distress    Rx / DC Orders ED Discharge Orders          Ordered    Ambulatory Pleural Drainage Schedule       Comments: Drain daily, up to max of 1L until patient is only able to drain out 130ml. If <175ml for 3 consecutive drains every other day, then call Interventional  Radiology [phone]) for evaluation and possible removal.   06/27/21 1517             Godfrey Pick, MD 06/27/21 1810

## 2021-06-27 NOTE — Procedures (Addendum)
PleurX Insertion Procedure Note  Peggy Howard  [id number]  February 17, 1933  Date:06/27/21  Time:3:44 PM   Provider Performing:Pete E Kary Kos  Procedure: right sided PleurX Tunneled Pleural Catheter Placement (50277)  Indication(s) Relief of dyspnea from recurrent effusion  Consent Risks of the procedure as well as the alternatives and risks of each were explained to the patient and/or caregiver.  Consent for the procedure was obtained.   Anesthesia Topical only with 1% lidocaine    Time Out Verified patient identification, verified procedure, site/side was marked, verified correct patient position, special equipment/implants available, medications/allergies/relevant history reviewed, required imaging and test results available.   Sterile Technique Maximal sterile technique including sterile barrier drape, hand hygiene, sterile gown, sterile gloves, mask, hair covering.    Procedure Description Ultrasound used to identify appropriate pleural anatomy for placement and overlying skin marked.  Area of drainage cleaned and draped in sterile fashion.   Lidocaine was used to anesthetize the skin and subcutaneous tissue.   1.5 cm incision made overlying fluid and another about 5 cm anterior to this along chest wall.  PleurX catheter inserted in usual sterile fashion using modified seldinger technique.  Interrupted silk sutures placed at catheter insertion and tunneling points which will be removed at later date.  PleurX catheter then hooked to suction.  After fluid aspirated, pleurX capped and sterile dressing applied.  Estimated total ~ 1 liters blood tinged pleural fluid.    Complications/Tolerance None; patient tolerated the procedure well. Chest X-ray is ordered to confirm no post-procedural complication.   EBL Minimal   Specimen(s) none  Erick Colace ACNP-BC [place] Pager # [phone] OR # [phone] if no answer

## 2021-06-27 NOTE — Progress Notes (Signed)
   Doing well post pleurx Cxr looks ssable On [facility]  Plan  - transfer to med surg (d/w Dr Cherylann Ratel)     SIGNATURE    Dr. Brand Males, M.D., F.C.C.P,  Pulmonary and Critical Care Medicine Staff Physician, [place] Director - Interstitial Lung Disease  Program  Pulmonary [place] at [place], [place], [address]  NPI Number:  NPI #[id number]  Pager: [phone], If no answer  -> Check AMION or Try [phone] Telephone (clinical office): [phone] Telephone (research): [phone]  5:34 PM 06/27/2021

## 2021-06-27 NOTE — ED Triage Notes (Signed)
Pt presents d/t shob.  Has a dx of CA, questionable mets to lungs.  Pt is unable to speak in full sentences.  Lung sounds are diminished w/ inspiratory wheezes noted in all fields.

## 2021-06-27 NOTE — H&P (Signed)
History and Physical    Peggy Howard YTK:[id number] DOB: June 13, 1933 DOA: 06/27/2021  PCP: Lorrene Reid, PA-C  Patient coming from: home  Chief Complaint: dyspnea  HPI: Peggy Howard is a 87 y.o. female with medical history significant of malignant pleural effusion, melanoma. Recently in hospital for rectopexy. During that stay, she suffered from increasing dyspnea post procedure. W/u found pleural effusion. Studies on the fluid showed melanoma. She reported having melanoma of her 4th digit of her left hand. It was subsequently amputated. No other lesion had been found at that time. She followed up with oncology (Dr. Marin Olp on 7/29). There was concern for shortness of breath at that visit. A PET scan was scheduled at that time. The patient reports that her dyspnea has worsened over the last 4 days. She is now unable to do any activity without significant dyspnea. She has not tried any medicines to help. Family convinced her to come to the ED for evaluation this morning.   ED Course: She was found to be hypoxic. She was placed on 2L [place]. She was given steroids. CXR showed recurrent large right pleural effusion and persistent right basilar airspace disease when compared to 06/21/21 films. [facility] was called for admission.   Review of Systems:  Denies palpitations, N/V/D, fever, chills. Reports dyspnea, lightheadedness, chest heaviness. Review of systems is otherwise negative for all not mentioned in HPI.   PMHx 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   Melanoma metastatic to lung [facility]) 06/21/2021   Rectal prolapse     PSHx Past Surgical History:  Procedure Laterality Date   ABDOMINAL HYSTERECTOMY     amputation of left finger      2021   BIOPSY  03/27/2021   Procedure: BIOPSY;  Surgeon: Irving Copas., MD;  Location: [facility];  Service: Gastroenterology;;    COLONOSCOPY WITH PROPOFOL N/A 03/27/2021   Procedure: COLONOSCOPY WITH PROPOFOL;  Surgeon: Irving Copas., MD;  Location: [facility];  Service: Gastroenterology;  Laterality: N/A;   JOINT REPLACEMENT     POLYPECTOMY  03/27/2021   Procedure: POLYPECTOMY;  Surgeon: Mansouraty, Telford Nab., MD;  Location: [facility];  Service: Gastroenterology;;   REPLACEMENT TOTAL KNEE Bilateral    XI ROBOT ASSISTED RECTOPEXY N/A 06/12/2021   Procedure: XI ROBOT ASSISTED RECTOPEXY;  Surgeon: Leighton Ruff, MD;  Location: [facility];  Service: General;  Laterality: N/A;    SocHx  reports that she quit smoking about 45 years ago. Her smoking use included cigarettes. She has a 50.00 pack-year smoking history. She has never used smokeless tobacco. She reports current drug use. Drug: Benzodiazepines. She reports that she does not drink alcohol.  Allergies  Allergen Reactions   Vitamin D Analogs     "dizziness"    FamHx No family history on file.  Prior to Admission medications   Medication Sig Start Date End Date Taking? Authorizing Provider  acetaminophen (TYLENOL) 500 MG tablet Take 1,000 mg by mouth every 6 (six) hours as needed for mild pain, fever or headache.   Yes [provider]  albuterol (VENTOLIN HFA) 108 (90 Base) MCG/ACT inhaler Inhale 2 puffs into the lungs every 6 (six) hours as needed for wheezing or shortness of breath. 06/16/21  Yes Gonfa, Taye T, MD  fluticasone-salmeterol (ADVAIR DISKUS) 250-50 MCG/ACT AEPB Inhale 1 puff into the lungs in the morning and at bedtime. 06/17/21  Yes Mercy Riding, MD  Multiple Vitamins-Minerals (PRESERVISION AREDS 2 PO) Take 1 tablet by mouth 2 (two) times daily.   Yes [provider]  psyllium (METAMUCIL SMOOTH TEXTURE) 58.6 % powder Take 1 packet by mouth in the morning and at bedtime. 5/80/99  Yes Leighton Ruff, MD  ferrous sulfate 325 (65 FE) MG tablet Take 325 mg by mouth daily with breakfast.    [provider]     Physical Exam: Vitals:   06/27/21 0908 06/27/21 0913 06/27/21 0930 06/27/21 1000  BP: (!) 135/94  129/88 137/79  Pulse: 88  85 82  Resp: (!) 28  20 (!) 23  Temp: 98.1 F (36.7 C)     TempSrc: Oral     SpO2: (!) 89%  94% 90%  Weight:  73 kg    Height:  5\' 4"  (1.626 m)      General: 87 y.o. female resting in bed in NAD Eyes: PERRL, normal sclera ENMT: Nares patent w/o discharge, orophaynx clear, dentition poor,  ears w/o discharge/lesions/ulcers Neck: Supple, trachea midline Cardiovascular: RRR, +S1, S2, no m/g/r, equal pulses throughout Respiratory: diffuse wheeze, decreased air movement at right base and mid-lung posteriorly, increased WOB on 2L [place] GI: BS+, NDNT, no masses noted, no organomegaly noted MSK: No e/c/c Skin: No rashes, bruises, ulcerations noted Neuro: A&O x 3, no focal deficits Psyc: Appropriate interaction and affect, calm/cooperative  Labs on Admission: I have personally reviewed following labs and imaging studies  CBC: Recent Labs  Lab 06/21/21 1322 06/27/21 0925 06/27/21 0939  WBC 13.9* 11.5*  --   NEUTROABS 12.7* 9.1*  --   HGB 12.4 12.6 12.6  HCT 37.9 38.9 37.0  MCV 95.2 96.0  --   PLT 408* 487*  --    Basic Metabolic Panel: Recent Labs  Lab 06/21/21 1322 06/27/21 0925 06/27/21 0939  NA 141 139 138  K 3.9 4.3 4.1  CL 104 104 104  CO2 26 26  --   GLUCOSE 200* 154* 157*  BUN 37* 27* 25*  CREATININE 0.78 0.76 0.70  CALCIUM 9.0 8.9  --    GFR: Estimated Creatinine Clearance: 50.3 mL/min (by C-G formula based on SCr of 0.7 mg/dL). Liver Function Tests: Recent Labs  Lab 06/21/21 1322 06/27/21 0925  AST 18 25  ALT 22 14  ALKPHOS 64 57  BILITOT 0.5 0.8  PROT 6.4* 6.5  ALBUMIN 3.7 3.4*   No results for input(s): LIPASE, AMYLASE in the last 168 hours. No results for input(s): AMMONIA in the last 168 hours. Coagulation Profile: No results for input(s): INR, PROTIME in the last 168 hours. Cardiac Enzymes: No results for  input(s): CKTOTAL, CKMB, CKMBINDEX, TROPONINI in the last 168 hours. BNP (last 3 results) No results for input(s): PROBNP in the last 8760 hours. HbA1C: No results for input(s): HGBA1C in the last 72 hours. CBG: No results for input(s): GLUCAP in the last 168 hours. Lipid Profile: No results for input(s): CHOL, HDL, LDLCALC, TRIG, CHOLHDL, LDLDIRECT in the last 72 hours. Thyroid Function Tests: No results for input(s): TSH, T4TOTAL, FREET4, T3FREE, THYROIDAB in the last 72 hours. Anemia Panel: No results for input(s): VITAMINB12, FOLATE, FERRITIN, TIBC, IRON, RETICCTPCT in the last 72 hours. Urine analysis:    Component Value Date/Time   COLORURINE YELLOW 03/25/2021 1537   APPEARANCEUR HAZY (A) 03/25/2021 1537   LABSPEC 1.027 03/25/2021 1537   PHURINE 6.0 03/25/2021 1537   GLUCOSEU NEGATIVE 03/25/2021 1537   HGBUR MODERATE (A) 03/25/2021 1537   BILIRUBINUR NEGATIVE 03/25/2021 1537  KETONESUR NEGATIVE 03/25/2021 1537   PROTEINUR NEGATIVE 03/25/2021 1537   NITRITE NEGATIVE 03/25/2021 1537   LEUKOCYTESUR NEGATIVE 03/25/2021 1537    Radiological Exams on Admission: DG Chest Port 1 View  Result Date: 06/27/2021 CLINICAL DATA:  Shortness of breath in a patient with a history of metastatic melanoma. EXAM: PORTABLE CHEST 1 VIEW COMPARISON:  Single-view of the chest 06/21/2021. FINDINGS: The patient has a large right pleural effusion which has worsened since the prior exam. Associated airspace opacity is present in the right mid and lower lung zones. The left lung appears clear. Cardiac silhouette is largely obscured. Aortic atherosclerosis noted. IMPRESSION: Large right pleural effusion has worsened since the prior exam. Right basilar airspace disease appears unchanged. Electronically Signed   By: Inge Rise M.D.   On: 06/27/2021 10:03    EKG: Independently reviewed. Normal sinus, no st elevations  Assessment/Plan Acute hypoxic respiratory failure secondary to malignant pleural  effusion Malignant melanoma     - admit to inpt, SDU     - spoke with PCCM; SDU team will take her for pleurx placement     - continue steroids for now     - continue nebs     - wean O2 as able     - CXR w/ persistent right basilar opacity compared to 7/29; see below  RLL PNA?     - CXR as above     - she hasn't had treatment for PNA after the 7/29 film     - she was on steroids for 4 days after discharge; so a procal may be affected; but we will check one     - let's go ahead and start her on abx; check MRSA swab  Hyperlgycemia     - secondary to steroids outpt?      - no history of DM; Check A1c  DVT prophylaxis: lovenox  Code Status: DNR confirmed w/ pt, dtr at bedside  Family Communication: w/ dtr at bedside  Consults called: PCCM   Status is: Inpatient  Remains inpatient appropriate because:Inpatient level of care appropriate due to severity of illness  Dispo: The patient is from: Home              Anticipated d/c is to: Home              Patient currently is not medically stable to d/c.   Difficult to place patient No  Time spent coordinating admission: 70 minutes  [facility]  If 7PM-7AM, please contact night-coverage [email or web address]  06/27/2021, 10:48 AM

## 2021-06-27 NOTE — Consult Note (Signed)
NAME:  Peggy Howard, MRN:  [id number], DOB:  [birth year], LOS: 0 ADMISSION DATE:  06/27/2021, CONSULTATION DATE:  06/27/21 REFERRING MD:  DR Cherylann Ratel, CHIEF COMPLAINT:  Recurrent Malignant Melanoma Pleural Effusion  History of Present Illness:   87 year old [place].  In April 2021 she had a subungual melanoma removed from the fourth finger on the left hand.  Then admitted on 06/12/2021 for rectal prolapse and had a rectopexy.  In the aftermath of that she had shortness of breath found to have a right pleural effusion.  She underwent thoracentesis 1.4 L found to have metastatic cells consistent with melanoma.  She also had pulmonary nodules along the pleura and suspicious mediastinal adenopathy.  She has followed up with Dr. Burney Gauze oncologist 06/21/2021.  On exam physical exam he did not find any evidence of melanoma anywhere.  Noted that ECOG was 1.  She had amputation of the distal finger on the left hand.  This ring finger.  He recommended a PET scan.  He is also hoping to get a liquid biopsy for the BRAF gene study.  [The pleural fluid did not have enough sample for this].  This would help patient undergo targeted therapy otherwise gentle immunotherapy.  She represented emergency department 06/27/2021 with worsening shortness of breath for 4 days.  Requiring 2 L nasal cannula hypoxemic acute respiratory failure.  Chest x-ray showed recurrence of large right-sided pleural effusion and persistent right basilar airspace disease [personally visualized by PCCM].  She is admitted to the stepdown unit and pulmonary consultation called.     Pertinent  Medical History    has a past medical history of Arthritis, Goals of care, counseling/discussion (06/21/2021), Malignant pleural effusion (06/21/2021), Melanoma metastatic to lung ([place]) (06/21/2021), and Rectal prolapse.   reports that she quit smoking about 45 years ago. Her smoking use included cigarettes. She has a 50.00 pack-year smoking  history. She has never used smokeless tobacco.  Past Surgical History:  Procedure Laterality Date   ABDOMINAL HYSTERECTOMY     amputation of left finger      2021   BIOPSY  03/27/2021   Procedure: BIOPSY;  Surgeon: Irving Copas., MD;  Location: [facility];  Service: Gastroenterology;;   COLONOSCOPY WITH PROPOFOL N/A 03/27/2021   Procedure: COLONOSCOPY WITH PROPOFOL;  Surgeon: Irving Copas., MD;  Location: [facility];  Service: Gastroenterology;  Laterality: N/A;   JOINT REPLACEMENT     POLYPECTOMY  03/27/2021   Procedure: POLYPECTOMY;  Surgeon: Mansouraty, Telford Nab., MD;  Location: [facility];  Service: Gastroenterology;;   REPLACEMENT TOTAL KNEE Bilateral    XI ROBOT ASSISTED RECTOPEXY N/A 06/12/2021   Procedure: XI ROBOT ASSISTED RECTOPEXY;  Surgeon: Leighton Ruff, MD;  Location: [facility];  Service: General;  Laterality: N/A;    Allergies  Allergen Reactions   Vitamin D Analogs     "dizziness"     There is no immunization history on file for this patient.  No family history on file.   Current Facility-Administered Medications:    albuterol (PROVENTIL) (2.5 MG/3ML) 0.083% nebulizer solution 2.5 mg, 2.5 mg, Nebulization, BID, Kyle, Tyrone A, DO   Chlorhexidine Gluconate Cloth 2 % PADS 6 each, 6 each, Topical, Daily, Kyle, Tyrone A, DO, 6 each at 06/27/21 1226   MEDLINE mouth rinse, 15 mL, Mouth Rinse, BID, Kyle, Tyrone A, DO, 15 mL at 06/27/21 1226   Significant Hospital Events: Including procedures, antibiotic start and stop dates in addition to other pertinent events   8/4/20220 -  admit  Interim History / Subjective:  06/27/2021  - seen by CCM bed [address]  Objective   Blood pressure (!) 165/97, pulse 89, temperature 97.7 F (36.5 C), temperature source Oral, resp. rate (!) 26, height $RemoveBe'5\' 4"'ieVWOwrSh$  (1.626 m), weight 73 kg, SpO2 94 %.    FiO2 (%):  [32 %] 32 %  No intake or output data in the 24 hours ending 06/27/21 1255 Filed Weights   06/27/21  0913  Weight: 73 kg    Examination: General: Pleasant female getting nebulizer treatment sitting in the bed. HENT: No neck nodes no elevated JVP.  Oxygen on. Lungs: Reduced air entry on the right side.  Clear to auscultation. Cardiovascular: Normal heart sounds regular rate and rhythm Abdomen: Soft nontender no organomegaly Extremities: Intact except for amputation in the left finger from previous subungual melanoma Skin: Grossly intact.  No obvious evidence of melanoma the exposed parts Neuro: Alert and oriented x3.  Speech is normal. GU: Not examined   LABS    PULMONARY Recent Labs  Lab 06/27/21 0925 06/27/21 0939  HCO3 25.2  --   TCO2  --  26  O2SAT 62.9  --     CBC Recent Labs  Lab 06/21/21 1322 06/27/21 0925 06/27/21 0939  HGB 12.4 12.6 12.6  HCT 37.9 38.9 37.0  WBC 13.9* 11.5*  --   PLT 408* 487*  --     COAGULATION No results for input(s): INR in the last 168 hours.  CARDIAC  No results for input(s): TROPONINI in the last 168 hours. No results for input(s): PROBNP in the last 168 hours.   CHEMISTRY Recent Labs  Lab 06/21/21 1322 06/27/21 0925 06/27/21 0939  NA 141 139 138  K 3.9 4.3 4.1  CL 104 104 104  CO2 26 26  --   GLUCOSE 200* 154* 157*  BUN 37* 27* 25*  CREATININE 0.78 0.76 0.70  CALCIUM 9.0 8.9  --    Estimated Creatinine Clearance: 50.3 mL/min (by C-G formula based on SCr of 0.7 mg/dL).   LIVER Recent Labs  Lab 06/21/21 1322 06/27/21 0925  AST 18 25  ALT 22 14  ALKPHOS 64 57  BILITOT 0.5 0.8  PROT 6.4* 6.5  ALBUMIN 3.7 3.4*     INFECTIOUS No results for input(s): LATICACIDVEN, PROCALCITON in the last 168 hours.   ENDOCRINE CBG (last 3)  No results for input(s): GLUCAP in the last 72 hours.       IMAGING x48h  - image(s) personally visualized  -   highlighted in bold DG Chest Port 1 View  Result Date: 06/27/2021 CLINICAL DATA:  Shortness of breath in a patient with a history of metastatic melanoma. EXAM:  PORTABLE CHEST 1 VIEW COMPARISON:  Single-view of the chest 06/21/2021. FINDINGS: The patient has a large right pleural effusion which has worsened since the prior exam. Associated airspace opacity is present in the right mid and lower lung zones. The left lung appears clear. Cardiac silhouette is largely obscured. Aortic atherosclerosis noted. IMPRESSION: Large right pleural effusion has worsened since the prior exam. Right basilar airspace disease appears unchanged. Electronically Signed   By: Inge Rise M.D.   On: 06/27/2021 10:03     Resolved Hospital Problem list   x  Assessment & Plan:  Baseline medical problems present on admission Metastatic malignant melanoma with metastatic right-sided pleural effusion Recurrent right-sided malignant pleural effusion  Acute problems present on admission Class IV dyspnea because of the above Acute hypoxemic respiratory failure  mild-moderate requiring 2 L nasal and some amount of respiratory distress due to the above  Plan - Oxygen for pulse ox goal greater than 88% -Recommend thoracentesis with placement of Pleurx catheter with cells sent for cytology amd BRAF cell analysis - Recommend a right Pleurx catheter -for management of dyspnea and chronic recurrent malignant pleural effusion  - Risks of pneumothorax, hemothorax, sedation/anesthesia complications such as cardiac or respiratory arrest or hypotension, stroke and bleeding all explained. Benefits of diagnosis but limitations of non-diagnosis also explained. Patient verbalized understanding and wished to proceed.   -Procedure to be done by Salvadore Dom and Dr. Ina Homes  -Currently full code  Postprocedure can go to medical floor  Best Practice (right click and "Reselect all SmartList Selections" daily)  According to the hospitalist   SIGNATURE    Dr. Brand Males, M.D., F.C.C.P,  Pulmonary and Critical Care Medicine Staff Physician, [place] Director -  Interstitial Lung Disease  Program  Pulmonary [place] at [place], [place], [address]  NPI Number:  NPI #[id number]  Pager: [phone], If no answer  -> Check AMION or Try [phone] Telephone (clinical office): [phone] Telephone (research): [phone]  12:55 PM 06/27/2021

## 2021-06-27 NOTE — Telephone Encounter (Signed)
[facility] Triage Pool 1 hour ago (3:27 PM)     Hi! Please schedule new patient appointment in 2 weeks with Dr. Valeta Harms (management of PleurX device placed by PCCM while admitted)    Dr Valeta Harms, are you okay using a held slot for this?

## 2021-06-27 NOTE — Plan of Care (Signed)
RN will continue to monitor patients progress

## 2021-06-28 ENCOUNTER — Ambulatory Visit (HOSPITAL_COMMUNITY)
Admission: RE | Admit: 2021-06-28 | Discharge: 2021-06-28 | Disposition: A | Discharge status: Home / Self Care | Payer: Medicare Other | Source: Ambulatory Visit | Attending: Hematology & Oncology | Admitting: Hematology & Oncology

## 2021-06-28 LAB — CBC
HCT: 37.2 % (ref 36.0–46.0)
Hemoglobin: 12 g/dL (ref 12.0–15.0)
MCH: 31 pg (ref 26.0–34.0)
MCHC: 32.3 g/dL (ref 30.0–36.0)
MCV: 96.1 fL (ref 80.0–100.0)
Platelets: 411 10*3/uL — ABNORMAL HIGH (ref 150–400)
RBC: 3.87 MIL/uL (ref 3.87–5.11)
RDW: 13.9 % (ref 11.5–15.5)
WBC: 12.6 10*3/uL — ABNORMAL HIGH (ref 4.0–10.5)
nRBC: 0 % (ref 0.0–0.2)

## 2021-06-28 LAB — COMPREHENSIVE METABOLIC PANEL
ALT: 15 U/L (ref 0–44)
AST: 18 U/L (ref 15–41)
Albumin: 2.9 g/dL — ABNORMAL LOW (ref 3.5–5.0)
Alkaline Phosphatase: 48 U/L (ref 38–126)
Anion gap: 5 (ref 5–15)
BUN: 18 mg/dL (ref 8–23)
CO2: 27 mmol/L (ref 22–32)
Calcium: 8.6 mg/dL — ABNORMAL LOW (ref 8.9–10.3)
Chloride: 108 mmol/L (ref 98–111)
Creatinine, Ser: 0.59 mg/dL (ref 0.44–1.00)
GFR, Estimated: >60 mL/min (ref >60)
Glucose, Bld: 122 mg/dL — ABNORMAL HIGH (ref 70–99)
Potassium: 5 mmol/L (ref 3.5–5.1)
Sodium: 140 mmol/L (ref 135–145)
Total Bilirubin: 0.6 mg/dL (ref 0.3–1.2)
Total Protein: 5.7 g/dL — ABNORMAL LOW (ref 6.5–8.1)

## 2021-06-28 LAB — PROCALCITONIN: Procalcitonin: <0.1 ng/mL

## 2021-06-28 MED ORDER — ENSURE ENLIVE PO LIQD
237.0000 mL | Freq: Two times a day (BID) | ORAL | Status: DC
  Administered 2021-06-29: 237 mL via ORAL

## 2021-06-28 NOTE — Progress Notes (Addendum)
[facility]  PROGRESS NOTE  Peggy Howard BZJ:[id number] DOB: 1933-09-28 DOA: 06/27/2021 PCP: Lorrene Reid, PA-C   Brief HPI:   87 year old female with medical history of malignant pleural effusion, melanoma, recently in the hospital for rectopexy.  Patient says that she suffered from worsening dyspnea postprocedure.  Found to have pleural effusion.  Studies on the fluid showed melanoma.  She reportedly had melanoma of the fourth digit of left hand which was subsequently amputated.  Patient has been having worsening shortness of breath.  PET scan was scheduled at that time. In the ED chest x-ray showed recurrent right pleural effusion with persistent right basilar airspace disease. PCCM was consulted,  thoracentesis was performed and Pleurx catheter was placed.   Subjective   Patient seen and examined, still requiring 4 L/min of oxygen.   Assessment/Plan:    Acute hypoxemic respiratory failure -Secondary to malignant pleural effusion/malignant melanoma -S/p thoracentesis; Pleurx catheter placement -Breathing is improving -Wean off O2 as tolerated, patient was on 2 L/min of oxygen at home -Patient is on prednisone 50 mg daily  Right base airspace disease -I doubt it is pneumonia, procalcitonin less than 1.10 -Will not initiate antibiotics   Malignant melanoma -Followed by oncology as outpatient    Scheduled medications:    albuterol  2.5 mg Nebulization BID   Chlorhexidine Gluconate Cloth  6 each Topical Daily   enoxaparin (LOVENOX) injection  40 mg Subcutaneous Q24H   [START ON 06/29/2021] feeding supplement  237 mL Oral BID BM   ferrous sulfate  325 mg Oral Q breakfast   mouth rinse  15 mL Mouth Rinse BID   multivitamin  1 tablet Oral BID   predniSONE  50 mg Oral Q breakfast   psyllium  1 packet Oral BID         Data Reviewed:   CBG:  No results for input(s): GLUCAP in the last 168 hours.  SpO2: 96 % O2 Flow Rate (L/min): 4 L/min FiO2 (%): 32  %    Vitals:   06/28/21 0717 06/28/21 0920 06/28/21 1326 06/28/21 1437  BP:  (!) 112/52 137/69 126/67  Pulse:  90 88 83  Resp:  18 18 18   Temp:  98.5 F (36.9 C) 98.7 F (37.1 C) 98.6 F (37 C)  TempSrc:  Oral Oral Oral  SpO2: 94% 97% 90% 96%  Weight:      Height:         Intake/Output Summary (Last 24 hours) at 06/28/2021 1648 Last data filed at 06/28/2021 1600 Gross per 24 hour  Intake 1102.18 ml  Output 450 ml  Net 652.18 ml    08/03 1901 - 08/05 0700 In: 742.2 [P.O.:390; I.V.:13.2] Out: 500   Filed Weights   06/27/21 0913  Weight: 73 kg    CBC:  Recent Labs  Lab 06/27/21 0925 06/27/21 0939 06/27/21 1611 06/28/21 0412  WBC 11.5*  --  8.2 12.6*  HGB 12.6 12.6 12.7 12.0  HCT 38.9 37.0 39.6 37.2  PLT 487*  --  439* 411*  MCV 96.0  --  96.4 96.1  MCH 31.1  --  30.9 31.0  MCHC 32.4  --  32.1 32.3  RDW 13.8  --  13.8 13.9  LYMPHSABS 1.3  --   --   --   MONOABS 0.8  --   --   --   EOSABS 0.1  --   --   --   BASOSABS 0.1  --   --   --  Complete metabolic panel:  Recent Labs  Lab 06/27/21 0925 06/27/21 0939 06/27/21 1611 06/28/21 0412  NA 139 138  --  140  K 4.3 4.1  --  5.0  CL 104 104  --  108  CO2 26  --   --  27  GLUCOSE 154* 157*  --  122*  BUN 27* 25*  --  18  CREATININE 0.76 0.70 0.68 0.59  CALCIUM 8.9  --   --  8.6*  AST 25  --   --  18  ALT 14  --   --  15  ALKPHOS 57  --   --  48  BILITOT 0.8  --   --  0.6  ALBUMIN 3.4*  --   --  2.9*  PROCALCITON  --   --  <0.10 <0.10  HGBA1C  --   --  6.2*  --   BNP 35.5  --   --   --     No results for input(s): LIPASE, AMYLASE in the last 168 hours.  Recent Labs  Lab 06/27/21 0918 06/27/21 0925 06/27/21 1611 06/28/21 0412  BNP  --  35.5  --   --   PROCALCITON  --   --  <0.10 <0.10  SARSCOV2NAA NEGATIVE  --   --   --     ------------------------------------------------------------------------------------------------------------------ No results for input(s): CHOL, HDL, LDLCALC,  TRIG, CHOLHDL, LDLDIRECT in the last 72 hours.  Lab Results  Component Value Date   HGBA1C 6.2 (H) 06/27/2021   ------------------------------------------------------------------------------------------------------------------ No results for input(s): TSH, T4TOTAL, T3FREE, THYROIDAB in the last 72 hours.  Invalid input(s): FREET3 ------------------------------------------------------------------------------------------------------------------ No results for input(s): VITAMINB12, FOLATE, FERRITIN, TIBC, IRON, RETICCTPCT in the last 72 hours.  Coagulation profile No results for input(s): INR, PROTIME in the last 168 hours. No results for input(s): DDIMER in the last 72 hours.  Cardiac Enzymes No results for input(s): CKTOTAL, CKMB, CKMBINDEX, TROPONINI in the last 168 hours.  ------------------------------------------------------------------------------------------------------------------    Component Value Date/Time   BNP 35.5 06/27/2021 0925     Antibiotics: Anti-infectives (From admission, onward)    Start     Dose/Rate Route Frequency Ordered Stop   06/27/21 1545  cefTRIAXone (ROCEPHIN) 2 g in sodium chloride 0.9 % 100 mL IVPB        2 g 200 mL/hr over 30 Minutes Intravenous Every 24 hours 06/27/21 1459 07/02/21 1544   06/27/21 1545  azithromycin (ZITHROMAX) 500 mg in sodium chloride 0.9 % 250 mL IVPB        500 mg 250 mL/hr over 60 Minutes Intravenous Every 24 hours 06/27/21 1459 07/02/21 1544        Radiology Reports  DG CHEST PORT 1 VIEW  Result Date: 06/27/2021 CLINICAL DATA:  Pleural effusion with worsening shortness of breath. EXAM: PORTABLE CHEST 1 VIEW COMPARISON:  Earlier same day.  06/21/2021. FINDINGS: Left chest remains clear. Right chest tube in place. Slightly less pleural fluid on the right than was seen previously. Mild cardiomegaly and aortic atherosclerosis as seen previously. No pneumothorax. IMPRESSION: Right chest tube in place. Slightly less  pleural fluid than was seen previously in the right hemithorax. No pneumothorax. Left chest remains clear. Electronically Signed   By: Nelson Chimes M.D.   On: 06/27/2021 16:14   DG Chest Port 1 View  Result Date: 06/27/2021 CLINICAL DATA:  Shortness of breath in a patient with a history of metastatic melanoma. EXAM: PORTABLE CHEST 1 VIEW COMPARISON:  Single-view of the chest 06/21/2021. FINDINGS:  The patient has a large right pleural effusion which has worsened since the prior exam. Associated airspace opacity is present in the right mid and lower lung zones. The left lung appears clear. Cardiac silhouette is largely obscured. Aortic atherosclerosis noted. IMPRESSION: Large right pleural effusion has worsened since the prior exam. Right basilar airspace disease appears unchanged. Electronically Signed   By: Inge Rise M.D.   On: 06/27/2021 10:03      DVT prophylaxis: Lovenox  Code Status: Full code  Family Communication: No family at bedside   Consultants: PCCM  Procedures:     Objective    Physical Examination:   General-appears in no acute distress Heart-S1-S2, regular, no murmur auscultated Lungs-clear to auscultation bilaterally, no wheezing or crackles auscultated Abdomen-soft, nontender, no organomegaly Extremities-no edema in the lower extremities Neuro-alert, oriented x3, no focal deficit noted  Status is: Inpatient  Dispo: The patient is from: Home              Anticipated d/c is to: Home              Anticipated d/c date is: 06/29/2021              Patient currently not stable for discharge  Barrier to discharge-ongoing management for acute hypoxemic respiratory failure  COVID-19 Labs  No results for input(s): DDIMER, FERRITIN, LDH, CRP in the last 72 hours.  Lab Results  Component Value Date   [place] NEGATIVE 06/27/2021   [place] NEGATIVE 06/10/2021   [place] NEGATIVE 03/25/2021    Microbiology  Recent Results (from the past 240  hour(s))  Resp Panel by RT-PCR (Flu A&B, Covid) Nasopharyngeal Swab     Status: None   Collection Time: 06/27/21  9:18 AM   Specimen: Nasopharyngeal Swab; Nasopharyngeal(NP) swabs in vial transport medium  Result Value Ref Range Status   SARS Coronavirus 2 by RT PCR NEGATIVE NEGATIVE Final    Comment: (NOTE) SARS-CoV-2 target nucleic acids are NOT DETECTED.  The SARS-CoV-2 RNA is generally detectable in upper respiratory specimens during the acute phase of infection. The lowest concentration of SARS-CoV-2 viral copies this assay can detect is 138 copies/mL. A negative result does not preclude SARS-Cov-2 infection and should not be used as the sole basis for treatment or other patient management decisions. A negative result may occur with  improper specimen collection/handling, submission of specimen other than nasopharyngeal swab, presence of viral mutation(s) within the areas targeted by this assay, and inadequate number of viral copies(<138 copies/mL). A negative result must be combined with clinical observations, patient history, and epidemiological information. The expected result is Negative.  Fact Sheet for Patients:  EntrepreneurPulse.com.au  Fact Sheet for Healthcare Providers:  IncredibleEmployment.be  This test is no t yet approved or cleared by the [place] FDA and  has been authorized for detection and/or diagnosis of SARS-CoV-2 by FDA under an Emergency Use Authorization (EUA). This EUA will remain  in effect (meaning this test can be used) for the duration of the COVID-19 declaration under Section 564(b)(1) of the Act, 21 U.S.C.section 360bbb-3(b)(1), unless the authorization is terminated  or revoked sooner.       Influenza A by PCR NEGATIVE NEGATIVE Final   Influenza B by PCR NEGATIVE NEGATIVE Final    Comment: (NOTE) The Xpert Xpress SARS-CoV-2/FLU/RSV plus assay is intended as an aid in the diagnosis of influenza from  Nasopharyngeal swab specimens and should not be used as a sole basis for treatment. Nasal washings and aspirates are unacceptable for  Xpert Xpress SARS-CoV-2/FLU/RSV testing.  Fact Sheet for Patients: EntrepreneurPulse.com.au  Fact Sheet for Healthcare Providers: IncredibleEmployment.be  This test is not yet approved or cleared by the [place] FDA and has been authorized for detection and/or diagnosis of SARS-CoV-2 by FDA under an Emergency Use Authorization (EUA). This EUA will remain in effect (meaning this test can be used) for the duration of the COVID-19 declaration under Section 564(b)(1) of the Act, 21 U.S.C. section 360bbb-3(b)(1), unless the authorization is terminated or revoked.  Performed at [facility], [place] [address], [place], [place] [address]   MRSA Next Gen by PCR, Nasal     Status: None   Collection Time: 06/27/21 12:22 PM   Specimen: Nasal Mucosa; Nasal Swab  Result Value Ref Range Status   MRSA by PCR Next Gen NOT DETECTED NOT DETECTED Final    Comment: (NOTE) The GeneXpert MRSA Assay (FDA approved for NASAL specimens only), is one component of a comprehensive MRSA colonization surveillance program. It is not intended to diagnose MRSA infection nor to guide or monitor treatment for MRSA infections. Test performance is not FDA approved in patients less than 72 years old. Performed at [facility], [place] [address], [place], [place] [address]              Oswald Hillock   [facility] If 7PM-7AM, please contact night-coverage at [email or web address], Office  [phone]   06/28/2021, 4:48 PM  LOS: 1 day

## 2021-06-28 NOTE — Consult Note (Signed)
Referral MD  Reason for Referral: Recurrent malignant right pleural effusion-metastatic melanoma  Chief Complaint  Patient presents with   Respiratory Distress  : I had a hard time breathing.  HPI: Ms. Corriher is well-known to me.  She is a very charming 87 year old [name] female.  She was diagnosed with metastatic melanoma a couple weeks ago when she was found to have a right pleural effusion.  This was removed.  Cytology showed malignant melanoma.  She had a melanoma of the fourth finger of the left hand that was removed probably about a year and a half ago.  We have been waiting for the BRAF analysis.  I saw her in the office a week ago.  We did a chest x-ray on her.  It showed a small right pleural effusion.  She was not symptomatic.  This past Monday, she started to have some shortness of breath.  This continue to worsen.  She subsequently came to the emergency room.  She had a chest x-ray done which showed a large right pleural effusion.  She subsequently had a Pleurx catheter placed.  She had a liter of fluid removed.  This was sent off for cytology and BRAF.Marland Kitchen  She feels better.  She has little bit of soreness on the right side.  She has had no fever.  There is been no bleeding.  She has had no nausea or vomiting.  There is no change in bowel or bladder habits.  She has had no leg swelling.  Currently, her performance status is ECOG 1.  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   Melanoma metastatic to lung ([place]) 06/21/2021   Rectal prolapse   :   Past Surgical History:  Procedure Laterality Date   ABDOMINAL HYSTERECTOMY     amputation of left finger      2021   BIOPSY  03/27/2021   Procedure: BIOPSY;  Surgeon: Irving Copas., MD;  Location: [facility];  Service: Gastroenterology;;   COLONOSCOPY WITH PROPOFOL N/A 03/27/2021   Procedure: COLONOSCOPY WITH PROPOFOL;   Surgeon: Irving Copas., MD;  Location: [facility];  Service: Gastroenterology;  Laterality: N/A;   JOINT REPLACEMENT     POLYPECTOMY  03/27/2021   Procedure: POLYPECTOMY;  Surgeon: Mansouraty, Telford Nab., MD;  Location: [facility];  Service: Gastroenterology;;   REPLACEMENT TOTAL KNEE Bilateral    XI ROBOT ASSISTED RECTOPEXY N/A 06/12/2021   Procedure: XI ROBOT ASSISTED RECTOPEXY;  Surgeon: Leighton Ruff, MD;  Location: [facility];  Service: General;  Laterality: N/A;  :   Current Facility-Administered Medications:    0.9 %  sodium chloride infusion, , Intravenous, PRN, Marylyn Ishihara, Tyrone A, DO, Last Rate: 10 mL/hr at 06/27/21 2000, Infusion Verify at 06/27/21 2000   acetaminophen (TYLENOL) tablet 650 mg, 650 mg, Oral, Q6H PRN, Kathryne Eriksson, NP, 650 mg at 06/27/21 2127   albuterol (PROVENTIL) (2.5 MG/3ML) 0.083% nebulizer solution 2.5 mg, 2.5 mg, Nebulization, BID, Kyle, Tyrone A, DO, 2.5 mg at 06/27/21 1958   azithromycin (ZITHROMAX) 500 mg in sodium chloride 0.9 % 250 mL IVPB, 500 mg, Intravenous, Q24H, Kyle, Tyrone A, DO, Stopped at 06/27/21 1930   cefTRIAXone (ROCEPHIN) 2 g in sodium chloride 0.9 % 100 mL IVPB, 2 g, Intravenous, Q24H, Kyle, Tyrone A, DO, Stopped at 06/27/21 1734   Chlorhexidine Gluconate Cloth 2 % PADS 6 each, 6 each, Topical, Daily, Kyle, Tyrone A, DO, 6  each at 06/27/21 1226   enoxaparin (LOVENOX) injection 40 mg, 40 mg, Subcutaneous, Q24H, Kyle, Tyrone A, DO, 40 mg at 06/27/21 2122   ferrous sulfate tablet 325 mg, 325 mg, Oral, Q breakfast, Kyle, Tyrone A, DO   HYDROcodone-acetaminophen (NORCO/VICODIN) 5-325 MG per tablet 1-2 tablet, 1-2 tablet, Oral, Q4H PRN, Candee Furbish, MD   ipratropium-albuterol (DUONEB) 0.5-2.5 (3) MG/3ML nebulizer solution 3 mL, 3 mL, Nebulization, Q6H PRN, Marylyn Ishihara, Tyrone A, DO   MEDLINE mouth rinse, 15 mL, Mouth Rinse, BID, Kyle, Tyrone A, DO, 15 mL at 06/27/21 2122   multivitamin (PROSIGHT) tablet 1 tablet, 1 tablet, Oral, BID, Kyle,  Tyrone A, DO, 1 tablet at 06/27/21 2122   predniSONE (DELTASONE) tablet 50 mg, 50 mg, Oral, Q breakfast, Kyle, Tyrone A, DO   psyllium (HYDROCIL/METAMUCIL) 1 packet, 1 packet, Oral, BID, Kyle, Tyrone A, DO, 1 packet at 06/27/21 2122:   albuterol  2.5 mg Nebulization BID   Chlorhexidine Gluconate Cloth  6 each Topical Daily   enoxaparin (LOVENOX) injection  40 mg Subcutaneous Q24H   ferrous sulfate  325 mg Oral Q breakfast   mouth rinse  15 mL Mouth Rinse BID   multivitamin  1 tablet Oral BID   predniSONE  50 mg Oral Q breakfast   psyllium  1 packet Oral BID  :   Allergies  Allergen Reactions   Vitamin D Analogs     "dizziness"  :  No family history on file.:   Social History   Socioeconomic History   Marital status: Widowed    Spouse name: Not on file   Number of children: Not on file   Years of education: Not on file   Highest education level: Not on file  Occupational History   Not on file  Tobacco Use   Smoking status: Former    Packs/day: 2.00    Years: 25.00    Pack years: 50.00    Types: Cigarettes    Quit date: 49    Years since quitting: 45.6   Smokeless tobacco: Never  Vaping Use   Vaping Use: Never used  Substance and Sexual Activity   Alcohol use: No   Drug use: Yes    Types: Benzodiazepines    Comment: Denies any use. 06/21/21   Sexual activity: Never  Other Topics Concern   Not on file  Social History Narrative   Not on file   Social Determinants of Health   Financial Resource Strain: Not on file  Food Insecurity: Not on file  Transportation Needs: Not on file  Physical Activity: Not on file  Stress: Not on file  Social Connections: Not on file  Intimate Partner Violence: Not on file  :  Review of Systems  Constitutional: Negative.   HENT: Negative.    Eyes: Negative.   Respiratory:  Positive for shortness of breath.   Cardiovascular:  Positive for chest pain.  Gastrointestinal: Negative.   Genitourinary: Negative.    Musculoskeletal: Negative.   Skin: Negative.   Neurological: Negative.   Endo/Heme/Allergies: Negative.   Psychiatric/Behavioral: Negative.      Exam:  Physical Exam Vitals reviewed.  HENT:     Head: Normocephalic and atraumatic.  Eyes:     Pupils: Pupils are equal, round, and reactive to light.  Cardiovascular:     Rate and Rhythm: Normal rate and regular rhythm.     Heart sounds: Normal heart sounds.  Pulmonary:     Effort: Pulmonary effort is normal.  Breath sounds: Normal breath sounds.  Abdominal:     General: Bowel sounds are normal.     Palpations: Abdomen is soft.  Musculoskeletal:        General: No tenderness or deformity. Normal range of motion.     Cervical back: Normal range of motion.  Lymphadenopathy:     Cervical: No cervical adenopathy.  Skin:    General: Skin is warm and dry.     Findings: No erythema or rash.  Neurological:     Mental Status: She is alert and oriented to person, place, and time.  Psychiatric:        Behavior: Behavior normal.        Thought Content: Thought content normal.        Judgment: Judgment normal.   Patient Vitals for the past 24 hrs:  BP Temp Temp src Pulse Resp SpO2 Height Weight  06/28/21 0527 134/70 97.9 F (36.6 C) Oral 75 16 97 % -- --  06/28/21 0253 129/67 98 F (36.7 C) Oral 66 16 95 % -- --  06/27/21 2203 121/68 98.1 F (36.7 C) Oral 87 16 96 % -- --  06/27/21 2100 134/65 -- -- 87 (!) 27 91 % -- --  06/27/21 2000 119/76 -- -- 84 17 95 % -- --  06/27/21 1942 -- 98.1 F (36.7 C) Axillary -- -- -- -- --  06/27/21 1621 -- 98.3 F (36.8 C) Oral -- -- -- -- --  06/27/21 1600 (!) 125/59 -- -- 85 (!) 27 91 % -- --  06/27/21 1550 -- -- -- -- -- 96 % -- --  06/27/21 1500 138/90 -- -- 93 (!) 24 92 % -- --  06/27/21 1400 (!) 128/54 -- -- 84 (!) 23 (!) 88 % -- --  06/27/21 1300 (!) 129/55 -- -- 79 (!) 21 90 % -- --  06/27/21 1243 -- -- -- -- -- 94 % -- --  06/27/21 1223 (!) 165/97 97.7 F (36.5 C) Oral 89 (!) 26  91 % -- --  06/27/21 1200 135/78 -- -- 81 (!) 27 (!) 88 % -- --  06/27/21 1130 135/81 -- -- 79 (!) 22 90 % -- --  06/27/21 1100 (!) 149/90 -- -- 82 (!) 26 92 % -- --  06/27/21 1000 137/79 -- -- 82 (!) 23 90 % -- --  06/27/21 0930 129/88 -- -- 85 20 94 % -- --  06/27/21 0913 -- -- -- -- -- -- $Rem'5\' 4"'jWxt$  (1.626 m) 161 lb (73 kg)  06/27/21 0908 (!) 135/94 98.1 F (36.7 C) Oral 88 (!) 28 (!) 89 % -- --      Recent Labs    06/27/21 1611 06/28/21 0412  WBC 8.2 12.6*  HGB 12.7 12.0  HCT 39.6 37.2  PLT 439* 411*    Recent Labs    06/27/21 0925 06/27/21 0939 06/27/21 1611 06/28/21 0412  NA 139 138  --  140  K 4.3 4.1  --  5.0  CL 104 104  --  108  CO2 26  --   --  27  GLUCOSE 154* 157*  --  122*  BUN 27* 25*  --  18  CREATININE 0.76 0.70 0.68 0.59  CALCIUM 8.9  --   --  8.6*    Blood smear review: None  Pathology: Pending    Assessment and Plan: Ms. Merkley is a very nice 87 year old white female.  She had metastatic melanoma.  This was manifested by right pleural effusion  that was malignant.  Again, we are sort of in a "holding pattern" until we know of the BRAF analysis is.  If there is a BRAF mutation, then we will use targeted therapy for the BRAF gene.  If there is no BRAF mutation, then we will have to go with immunotherapy.  Our goal is to clearly make sure this fluid does not come back.  She has a Pleurx catheter in.  I would like to believe that with therapy, which I believe will be successful, the Pleurx catheter will be able to come out.  She does look quite good.  Hopefully, she will be able to go home soon.  She has a lot of help at home.  I am just grateful for the outstanding care that she has had by everybody in the ICU and up on 3 E.  I do appreciate all of your hard work.   Lattie Haw, MD  [place] 6:33

## 2021-06-28 NOTE — Progress Notes (Addendum)
Initial Nutrition Assessment  INTERVENTION:   -Ensure Enlive po BID, each supplement provides 350 kcal and 20 grams of protein  NUTRITION DIAGNOSIS:   Increased nutrient needs related to cancer and cancer related treatments, chronic illness as evidenced by estimated needs.  GOAL:   Patient will meet greater than or equal to 90% of their needs  MONITOR:   PO intake, Supplement acceptance, Labs, Weight trends, I & O's  REASON FOR ASSESSMENT:   Malnutrition Screening Tool    ASSESSMENT:   87 year old female with recent diagnosis of metastatic melanoma, presenting for 3 days of worsening dyspnea. Found to have R pleural effusion.  8/4: s/p right sided PleurX Tunneled Pleural Catheter Placement  Patient in room, states she is HOH. Pt reports poor appetite and not eating very well over the past week. Pt now feeling better and consuming 60-100% of meals.  States she likes Ensure supplements and has had them before. Will order for additional kcals and protein.   Per weight records, pt has lost 18 lbs since 4/29 (10% wt loss x 3 months, significant for time frame).  Medications: Ferrous sulfate, MVI-Prosight  Labs reviewed.  NUTRITION - FOCUSED PHYSICAL EXAM:  Flowsheet Row Most Recent Value  Orbital Region No depletion  Upper Arm Region No depletion  Thoracic and Lumbar Region Unable to assess  Buccal Region No depletion  Temple Region No depletion  Clavicle Bone Region No depletion  Clavicle and Acromion Bone Region No depletion  Scapular Bone Region No depletion  Dorsal Hand No depletion  Patellar Region Unable to assess  Anterior Thigh Region Unable to assess  Posterior Calf Region Unable to assess  Edema (RD Assessment) None  Hair Reviewed  Eyes Reviewed  Mouth Reviewed  [poor dentition of bottom teeth]       Diet Order:   Diet Order             Diet regular Room service appropriate? Yes; Fluid consistency: Thin  Diet effective now                    EDUCATION NEEDS:   No education needs have been identified at this time  Skin:  Skin Assessment: Reviewed RN Assessment  Last BM:  8/4  Height:   Ht Readings from Last 1 Encounters:  06/27/21 5\' 4"  (1.626 m)    Weight:   Wt Readings from Last 1 Encounters:  06/27/21 73 kg    BMI:  Body mass index is 27.64 kg/m.  Estimated Nutritional Needs:   Kcal:  1800-2000  Protein:  75-90g  Fluid:  1.8L/day  Clayton Bibles, MS, RD, LDN Inpatient Clinical Dietitian Contact information available via Amion

## 2021-06-28 NOTE — Progress Notes (Signed)
Plurex procedure done. 450 pulled off patient. Pt stable.

## 2021-06-28 NOTE — TOC Initial Note (Signed)
Transition of Care [facility]) - Initial/Assessment Note    Patient Details  Name: Peggy Howard MRN: [id number] Date of Birth: 18-Mar-1933  Transition of Care [facility]) CM/SW Contact:    Lennart Pall, LCSW Phone Number: 06/28/2021, 1:33 PM  Clinical Narrative:                 Met with pt to introduce self/ TOC role.  Explained to pt that MD has referred to [name] to assist with Pleurx supplies and [place] set up.  Pt is agreeable and aware of plan to dc home with drain. Pt reports that she lives alone and has NO family or friends that are local and no one will be assisting her at home.  She states this very clearly and proudly and says "I will do it all myself."  She, also, insists that she will be driving herself home at dc. Have alerted MDs following and RN that pt insists she will be doing her own care of the drain and has asked if there are any concerns with this plan.  I will try and secure a HHRN to oversee drain management, however, this may be difficult. Order form left for MD completion and RN/ secretary have placed orders with [employer] for a 10-count box of canisters to be delivered to pt room for her to take home. (This should cover that time awaiting delivery of more supplies.)  TOC continues to follow.  Expected Discharge Plan: [place] Barriers to Discharge: Continued Medical Work up   Patient Goals and CMS Choice Patient states their goals for this hospitalization and ongoing recovery are:: return home      Expected Discharge Plan and Services Expected Discharge Plan: [place] In-house Referral: Clinical Social Work     Living arrangements for the past 2 months: Single Family Home Expected Discharge Date:  (unknown)               DME Arranged: Chest tube pluerex                    Prior Living Arrangements/Services Living arrangements for the past 2 months: Single Family Home Lives with:: Self Patient language and need  for interpreter reviewed:: Yes Do you feel safe going back to the place where you live?: Yes      Need for Family Participation in Patient Care: Yes (Comment) Care giver support system in place?: No (comment)   Criminal Activity/Legal Involvement Pertinent to Current Situation/Hospitalization: No - Comment as needed  Activities of Daily Living Home Assistive Devices/Equipment: Eyeglasses, Dentures (specify type) (upper/lower dentures) ADL Screening (condition at time of admission) Patient's cognitive ability adequate to safely complete daily activities?: Yes Is the patient deaf or have difficulty hearing?: No Does the patient have difficulty seeing, even when wearing glasses/contacts?: No Does the patient have difficulty concentrating, remembering, or making decisions?: No Patient able to express need for assistance with ADLs?: Yes Does the patient have difficulty dressing or bathing?: Yes Independently performs ADLs?: No Communication: Independent Dressing (OT): Needs assistance Is this a change from baseline?: Change from baseline, expected to last >3 days Grooming: Needs assistance Is this a change from baseline?: Change from baseline, expected to last >3 days Feeding: Needs assistance Is this a change from baseline?: Change from baseline, expected to last >3 days Bathing: Needs assistance Is this a change from baseline?: Change from baseline, expected to last >3 days Toileting: Needs assistance Is this a change  from baseline?: Change from baseline, expected to last >3days In/Out Bed: Needs assistance Is this a change from baseline?: Change from baseline, expected to last >3 days Walks in Home: Needs assistance Is this a change from baseline?: Change from baseline, expected to last >3 days Does the patient have difficulty walking or climbing stairs?: Yes (secondary to shortness of breath and weakness) Weakness of Legs: Both Weakness of Arms/Hands: None  Permission  Sought/Granted                  Emotional Assessment Appearance:: Appears stated age Attitude/Demeanor/Rapport: Engaged, Self-Confident Affect (typically observed): Stable Orientation: : Oriented to Self, Oriented to Place, Oriented to  Time, Oriented to Situation Alcohol / Substance Use: Not Applicable Psych Involvement: No (comment)  Admission diagnosis:  Acute respiratory failure ([place]) [J96.00] Patient Active Problem List   Diagnosis Date Noted   Acute respiratory failure ([place]) 06/27/2021   Melanoma metastatic to lung ([place]) 06/21/2021   Malignant pleural effusion 06/21/2021   Goals of care, counseling/discussion 06/21/2021   Rectal prolapse 06/12/2021   GIB (gastrointestinal bleeding) 03/26/2021   BRBPR (bright red blood per rectum) 03/25/2021   Abdominal pain 03/25/2021   Low back pain 03/25/2021   Overweight (BMI 25.0-29.9) 07/16/2017   Colonoscopy refused 07/16/2017   Refuses treatment 07/16/2017   Parent refuses immunizations 07/16/2017   Patient refuses to take medication 07/16/2017   History of cigarette smoking 06/26/2017   Other fatigue 06/26/2017   Memory changes 06/26/2017   Community acquired pneumonia of right middle lobe of lung    External hemorrhoids 01/26/2008   PCP:  Lorrene Reid, PA-C Pharmacy:   [facility], [address] [place] [address] Phone: [phone] Fax: [phone]     Social Determinants of Health (SDOH) Interventions    Readmission Risk Interventions No flowsheet data found.

## 2021-06-28 NOTE — Telephone Encounter (Signed)
Garner Nash, DO  You; Parrett, Fonnie Mu, NP; Martyn Ehrich, NP 16 hours ago (5:50 PM)     Ok to double book at [employer] and ask to come early (15 min slot is fine)  They need to bring a bottle with them to the office.  If not anything available for me TP and BW has seen several pleurex follow ups for me   Garner Nash, DO  [facility]  06/27/2021 5:48 PM    Appt scheduled for 07/12/21 at 9 am. Will hold in triage until d/c so we can make sure she knows about appt and have to to bring bottle. Note that Tammy nor Beth had any sooner openings.

## 2021-06-28 NOTE — Evaluation (Signed)
Physical Therapy Evaluation Patient Details Name: Peggy Howard MRN: [id number] DOB: Aug 24, 1933 Today's Date: 06/28/2021   History of Present Illness  87 y.o. female adm with hypoxia, CXR=  large right pleural effusion and persistent right basilar airspace disease  PMH: malignant pleural effusion, melanoma.  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  Clinical Impression  Pt admitted with above diagnosis.  Pt reports independence at her baseline. Feeling weaker and more "wobbly" than her normal. Used RW for added stability. SpO2=89-92% on RA with activity, replaced at 3L, SpO2=94%. Recommend check on RA at rest prior to d/c O2  Pt currently with functional limitations due to the deficits listed below (see PT Problem List). Pt will benefit from skilled PT to increase their independence and safety with mobility to allow discharge to the venue listed below.       Follow Up Recommendations No PT follow up    Equipment Recommendations  None recommended by PT    Recommendations for Other Services       Precautions / Restrictions Precautions Precaution Comments: monitor O2 Restrictions Weight Bearing Restrictions: No      Mobility  Bed Mobility Overal bed mobility: Needs Assistance Bed Mobility: Supine to Sit     Supine to sit: Modified independent (Device/Increase time)          Transfers Overall transfer level: Needs assistance   Transfers: Sit to/from Stand Sit to Stand: Supervision         General transfer comment: cues for hand placement  Ambulation/Gait Ambulation/Gait assistance: Supervision;Min guard Gait Distance (Feet): 180 Feet Assistive device: Rolling walker (2 wheeled);None Gait Pattern/deviations: Step-through pattern     General Gait Details: cues for RW position, 2 standing rests d/t fatigue. SpO2=89-92% on RA. DOE 2/4.  amb ~ 8' x2 without device, min/guard for safety.  Stairs             Wheelchair Mobility    Modified Rankin (Stroke Patients Only)       Balance Overall balance assessment: Needs assistance   Sitting balance-Leahy Scale: Good       Standing balance-Leahy Scale: Fair                               Pertinent Vitals/Pain Pain Assessment: No/denies pain    Home Living Family/patient expects to be discharged to:: Private residence Living Arrangements: Alone               Additional Comments: pt is not forth coming with info regarding home environment    Prior Function                 Hand Dominance        Extremity/Trunk Assessment   Upper Extremity Assessment Upper Extremity Assessment: Overall WFL for tasks assessed    Lower Extremity Assessment Lower Extremity Assessment: Overall WFL for tasks assessed       Communication      Cognition Arousal/Alertness: Awake/alert Behavior During Therapy: WFL for tasks assessed/performed Overall Cognitive Status: Within Functional Limits for tasks assessed                                        General Comments      Exercises     Assessment/Plan    PT Assessment Patient needs continued  PT services  PT Problem List Decreased mobility;Decreased balance;Decreased activity tolerance;Cardiopulmonary status limiting activity       PT Treatment Interventions Therapeutic exercise;Functional mobility training;Therapeutic activities;Patient/family education;Gait training;DME instruction    PT Goals (Current goals can be found in the Care Plan section)  Acute Rehab PT Goals Patient Stated Goal: home soon, feel better PT Goal Formulation: With patient Time For Goal Achievement: 07/12/21 Potential to Achieve Goals: Good    Frequency Min 3X/week   Barriers to discharge        Co-evaluation               AM-PAC PT "6 Clicks" Mobility  Outcome Measure Help needed turning from your back to your side while in a flat bed without using  bedrails?: None Help needed moving from lying on your back to sitting on the side of a flat bed without using bedrails?: None Help needed moving to and from a bed to a chair (including a wheelchair)?: A Little Help needed standing up from a chair using your arms (e.g., wheelchair or bedside chair)?: A Little Help needed to walk in hospital room?: A Little Help needed climbing 3-5 steps with a railing? : A Little 6 Click Score: 20    End of Session   Activity Tolerance: Patient tolerated treatment well Patient left: with call bell/phone within reach;in bed;with bed alarm set Nurse Communication: Mobility status PT Visit Diagnosis: Other abnormalities of gait and mobility (R26.89);Difficulty in walking, not elsewhere classified (R26.2)    Time: 1610-9604 PT Time Calculation (min) (ACUTE ONLY): 17 min   Charges:   PT Evaluation $PT Eval Low Complexity: [place], PT  Acute Rehab Dept ([place]) [phone] Pager [phone]  06/28/2021   [facility] 06/28/2021, 4:43 PM

## 2021-06-29 ENCOUNTER — Telehealth: Payer: Self-pay | Admitting: Internal Medicine

## 2021-06-29 DIAGNOSIS — C439 Malignant melanoma of skin, unspecified: Secondary | ICD-10-CM

## 2021-06-29 DIAGNOSIS — J449 Chronic obstructive pulmonary disease, unspecified: Secondary | ICD-10-CM

## 2021-06-29 DIAGNOSIS — R7303 Prediabetes: Secondary | ICD-10-CM

## 2021-06-29 DIAGNOSIS — J860 Pyothorax with fistula: Secondary | ICD-10-CM | POA: Diagnosis not present

## 2021-06-29 DIAGNOSIS — J9811 Atelectasis: Secondary | ICD-10-CM

## 2021-06-29 DIAGNOSIS — J910 Malignant pleural effusion: Secondary | ICD-10-CM

## 2021-06-29 LAB — PROCALCITONIN: Procalcitonin: <0.1 ng/mL

## 2021-06-29 NOTE — Progress Notes (Signed)
Nurse reviewed discharge instructions with pt and her son.  Pt and son verbalized understanding of discharge instructions and follow up appointments.  No new medications prescribed.  Pt received O2 and took home a box of the pleurx drainage systems.  Pt was drained prior to discharge and had 50cc output.  No concerns at time of discharge.

## 2021-06-29 NOTE — Telephone Encounter (Signed)
Pt to be discharged 06/29/2021  be sure has appt to see Ikard lined up asap and if Ikard not available w/in 2 weeks then any provider will do  re pleurex management

## 2021-06-29 NOTE — Progress Notes (Addendum)
Pt O2 sats were 95% at rest on room air.  When ambulating pt O2 sats were 85% on room air.  Pt needed 2 L of O2 to maintain sats above 88%.

## 2021-06-29 NOTE — TOC Transition Note (Signed)
Transition of Care [facility]) - CM/SW Discharge Note   Patient Details  Name: Peggy Howard MRN: [id number] Date of Birth: 1933/06/07  [facility] CM/SW Contact:  Leeroy Cha, RN Phone Number: 06/29/2021, 12:09 PM   Clinical Narrative:    O2 ordered through [employer] dme Rn ordered through [place]   Final next level of care: [place] Barriers to Discharge: Barriers Resolved   Patient Goals and CMS Choice Patient states their goals for this hospitalization and ongoing recovery are:: return home      Discharge Placement                       Discharge Plan and Services In-house Referral: Clinical Social Work   Post Acute Care Choice: Museum/gallery conservator, Home Health          DME Arranged: Oxygen DME Agency: [employer] Date DME Agency Contacted: 06/29/21 Time DME Agency Contacted: [id number] Representative spoke with at DME Agency: [email or web address]            Social Determinants of Health (SDOH) Interventions     Readmission Risk Interventions No flowsheet data found.

## 2021-06-29 NOTE — Progress Notes (Signed)
Peggy Howard still feels weak.  She is worried about going home today.  She is worried about having the fluid taken out of the catheter at home.  I try to reassure her that we would make sure that a nurse comes out to do this for her.  She had 460cc of fluid removed yesterday.  I am surprised that there was a much fluid that has reaccumulated.  I am still awaiting the BRAF analysis.  This is really the key as to how we can treat her.  Hopefully this will come back next week.  Her appetite is doing fairly well.  She has had no nausea or vomiting.  She does have some pain where she had the Pleurx catheter placed.  I told her that she can have pain for several days.  She has had no fever.  Her vital signs all look pretty stable.  Temperature 97.8.  Pulse 69.  Blood pressure 138/72.  Her lungs actually sound quite good.  She has good breath sounds on the left side.  There may be some slight decrease at the right base.  Cardiac exam regular rate and rhythm.  Abdomen is soft.  Again, hopefully, Peggy Howard will be able to go home this weekend.  Again, she has to have a home health nurse come out and make sure that the catheter is drained daily for right now.  I would like to believe that with successful treatment of the melanoma, the fluid will resolve in the catheter will be able to be taken out.  I told her that this might take a few months but I am confident that this will happen.  I appreciate the outstanding care that she is getting from all the staff up on [address].  Lattie Haw, MD  Proverbs 11:25

## 2021-06-29 NOTE — Discharge Summary (Signed)
Physician Discharge Summary  Varie Machamer Doolen JQB:[id number] DOB: 1933-03-09 DOA: 06/27/2021  PCP: Lorrene Reid, PA-C  Admit date: 06/27/2021 Discharge date: 06/29/2021  Admitted From: Home Disposition: Home  Recommendations for Outpatient Follow-up:  Follow ups as below. Please obtain CBC/BMP/Mag at follow up Please follow up on the following pending results: Pleural fluid fungal culture and BRAF analysis  Home Health: [facility] RN Equipment/Devices: Home oxygen, 2 L, and supplies for Pleurx cath   Discharge Condition: Stable CODE STATUS: DNR/DNI   Follow-up Information     Lorrene Reid, PA-C. Schedule an appointment as soon as possible for a visit in 1 week(s).   Specialty: Physician Assistant Why: If symptoms worsen, As needed Contact information: [place] [id number] [phone]         [facility], [place] Follow up.   Specialty: Home Health Services Why: they will call today to make home appointment Contact information: 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  Chest x-ray showed recurrent right pleural effusion with persistent right basilar airspace disease.  PCCM consulted and she had Pleurx cath placed on 8/4.  She had about 1 L of blood-tinged pleural fluid right away.  She was also started on IV ceftriaxone, azithromycin and prednisone although pneumonia felt to be less likely.  She was afebrile.  Procalcitonin were negative.  Had mild leukocytosis likely from steroid.  Her breathing improved.   She maintained appropriate saturation on room air at rest but desaturated with ambulation requiring 2 L to maintain saturation above 88%.   She was cleared for discharge by [facility] and oncology. They will arrange outpatient follow-up.  She is discharged on 2 L to use with ambulation or exertion. Patient and her son were educated on management of Pleurx cath. HHRN ordered as well.  She already has Advair and albuterol at home from recent hospitalization.  See individual problem list below for more on hospital course.  Discharge Diagnoses:  Acute hypoxemic respiratory failure likely due to malignant pleural effusion from malignant melanoma, atelectasis and underlying chronic COPD. S/p thoracocentesis and Pleurx cath with removal of 1 L.  Reportedly uses 2 L at home.  -Received ceftriaxone, azithromycin and prednisone for 3 days. -Required 2 L oxygen by [place] to maintain saturation above 88% with ambulation.  -Pleurx cath care and [facility] RN as above. -Continue home Advair and albuterol.   Malignant melanoma -Oncology to arrange outpatient follow-up  Leukocytosis/bandemia: Likely demargination from steroid.  Prediabetes: A1c 6.2%.  Body mass index is 27.64 kg/m. Nutrition Problem: Increased nutrient needs Etiology: cancer and cancer related treatments, chronic illness Signs/Symptoms: estimated needs Interventions: Ensure Enlive (each supplement provides 350kcal and 20 grams of protein)      Discharge Exam: Vitals:   06/29/21 0920 06/29/21 1322  BP:  127/71  Pulse:  83  Resp:  18  Temp:  98.4 F (36.9 C)  SpO2: (!) 85% 98%    GENERAL: No apparent distress.  Nontoxic. HEENT: MMM.  Vision and hearing grossly intact.  NECK: Supple.  No apparent JVD.  RESP: On RA at rest.  No IWOB.  Fair aeration bilaterally. CVS:  RRR. Heart sounds normal.  ABD/GI/GU: Bowel sounds present. Soft. Non tender.  MSK/EXT:  Moves extremities. No apparent deformity. No edema.  SKIN: Dressing over Pleurx cath DCI. NEURO: Awake, alert and oriented appropriately.  No apparent focal neuro deficit. PSYCH: Calm. Normal affect.   Discharge  Instructions  Discharge Instructions     Ambulatory Pleural Drainage Schedule   Complete by: As directed    Drain daily, up to max of 1L until patient is only able to drain out 1106m. If <1513mfor 3 consecutive drains every other day, then call Interventional Radiology ([phone] evaluation and possible removal.   Call MD for:  difficulty breathing, headache or visual disturbances   Complete by: As directed    Call MD for:  extreme fatigue   Complete by: As directed    Call MD for:  persistant dizziness or light-headedness   Complete by: As directed    Call MD for:  severe uncontrolled pain   Complete by: As directed    Call MD for:  temperature >100.4   Complete by: As directed    Diet general   Complete by: As directed    Discharge instructions   Complete by: As directed    It has been a pleasure taking care of you!  You were hospitalized with shortness of breath DVT pleural effusion (fluid accumulation around your lung) due to cancer.  Your symptoms improved after draining the fluid and placing the Pleurx cath (the drain).  See instruction about drain management.  Pulmonologist will arrange outpatient follow-up to evaluate your drain.  Oncologist will arrange outpatient follow-up to discuss treatment plan for the cancer (melanoma).   We are discharging you on 2 L of oxygen by nasal cannula with ambulation and exertion.  You may not need oxygen when you are resting.    Take care,   Increase activity slowly   Complete by: As directed    No wound care   Complete by: As directed       Allergies as of 06/29/2021       Reactions   Vitamin D Analogs    "dizziness"        Medication List     TAKE these medications    acetaminophen 500 MG tablet Commonly known as: TYLENOL Take 1,000 mg by mouth every 6 (six) hours as needed for mild pain, fever or headache.   albuterol 108 (90 Base) MCG/ACT inhaler Commonly known as: VENTOLIN HFA Inhale 2 puffs into the lungs  every 6 (six) hours as needed for wheezing or shortness of breath.   ferrous sulfate 325 (65 FE) MG tablet Take 325 mg by mouth daily with breakfast.   fluticasone-salmeterol 250-50 MCG/ACT Aepb Commonly known as: Advair Diskus Inhale 1 puff into the lungs in the morning and at bedtime.   Metamucil Smooth Texture 58.6 % powder Generic drug: psyllium Take 1 packet by mouth in the morning and at bedtime.   PRESERVISION AREDS 2 PO Take 1 tablet by mouth 2 (two) times daily.               Durable Medical Equipment  (From admission, onward)           Start     Ordered   06/29/21 1200  For home use only DME oxygen  Once       Question Answer Comment  Length of Need 6 Months   Mode or (Route) Nasal cannula   Liters per Minute 2   Frequency Continuous (stationary and portable oxygen  unit needed)   Oxygen delivery system Gas      06/29/21 1159            Consultations: PCCM Oncology  Procedures/Studies: 8/4-right-sided thoracocentesis and Pleurx cath placement   DG Chest 2 View  Result Date: 06/24/2021 CLINICAL DATA:  History of melanoma. Question right pleural effusion EXAM: CHEST - 2 VIEW COMPARISON:  06/17/2021.  06/14/2021. FINDINGS: The heart is enlarged. There is aortic atherosclerosis. Left chest is clear. On the right, there is some pleural fluid which may be partially loculated in the inferior chest. There is right middle lobe consolidation. Small pulmonary nodule shown by previous chest CT are not specifically appreciated by radiography. IMPRESSION: The patient does have some pleural fluid on the right, loculated at the lateral base. There is right middle lobe consolidation. Electronically Signed   By: Nelson Chimes M.D.   On: 06/24/2021 09:40   CT HEAD W & WO CONTRAST  Result Date: 06/15/2021 CLINICAL DATA:  Cancer of unknown primary, staging. EXAM: CT HEAD WITHOUT AND WITH CONTRAST TECHNIQUE: Contiguous axial images were obtained from the base of the  skull through the vertex without and with intravenous contrast CONTRAST:  71m OMNIPAQUE IOHEXOL 350 MG/ML SOLN COMPARISON:  None. FINDINGS: Brain: There is no evidence of an acute infarct, intracranial hemorrhage, mass, midline shift, or extra-axial fluid collection. The ventricles and sulci are within normal limits for age. Hypodensities in the cerebral white matter bilaterally are nonspecific but compatible with mild chronic small vessel ischemic disease. No abnormal intracranial enhancement suggestive of metastatic disease is seen. A small developmental venous anomaly is incidentally noted in the right parietal lobe. Vascular: Calcified atherosclerosis at the skull base. Major dural venous sinuses and large arteries at the base of the brain are grossly patent. Skull: Well-defined 2 cm lucency scalloping the inner table of the left parietal skull with internal fluid density, benign in appearance. Sinuses/Orbits: Visualized paranasal sinuses and mastoid air cells are clear. Unremarkable orbits. Other: None. IMPRESSION: 1. No evidence of intracranial metastases. 2. Mild chronic small vessel ischemic disease. Electronically Signed   By: ALogan BoresM.D.   On: 06/15/2021 14:23   CT Angio Chest Pulmonary Embolism (PE) W or WO Contrast  Result Date: 06/14/2021 CLINICAL DATA:  87year old with chest pain or shortness of breath, pleurisy or effusion suspected. EXAM: CT ANGIOGRAPHY CHEST WITH CONTRAST TECHNIQUE: Multidetector CT imaging of the chest was performed using the standard protocol during bolus administration of intravenous contrast. Multiplanar CT image reconstructions and MIPs were obtained to evaluate the vascular anatomy. CONTRAST:  758mOMNIPAQUE IOHEXOL 350 MG/ML SOLN COMPARISON:  Chest radiograph 06/14/2021 FINDINGS: Cardiovascular: Main pulmonary arteries are patent without large filling defects. Evaluation of the pulmonary arteries beyond the lobar branches is limited. Cannot confidently evaluate  the segmental and subsegmental branches. There is one segmental branch in the left lower lobe on sequence 5, image 140 that is indeterminate. However favor artifact rather than true pulmonary embolism. No evidence for large or central pulmonary embolism. Atherosclerotic calcifications involving the coronary arteries and thoracic aorta. Ascending thoracic aorta is ectatic measuring at least 3.9 cm but there is significant pulsation artifact in this region. No significant pericardial effusion. Mediastinum/Nodes: Thyroid tissue is heterogeneous with a large right thyroid lobe. Prominent AP window lymph node on sequence 4 image 34 measuring 1.1 cm short axis. Limited evaluation for right hilar and subcarinal lymphadenopathy due to the right pleural fluid. Concern for soft tissue fullness in the subcarinal and right paratracheal region. No  significant axillary lymph node enlargement. Lungs/Pleura: Large right pleural effusion with scattered areas of pleural thickening and nodularity. The large area pleural-based thickening in the medial right lower chest on sequence 4 image 75 that measures 4.6 x 1.8 cm. Significant volume loss in the right lower lobe. Extensive nodularity along the right lung fissures. There appears to be a small azygos lobe. No evidence for left pleural fluid. There is a suspicious pleural-based nodule in the left lower lobe on sequence 6, 97 measuring up to 6 mm. Additional nodule in the left upper lobe on image 60. Upper Abdomen: Low-density fullness involving the left adrenal gland probably represents an adenoma but technically indeterminate based on the Hounsfield units. Mild fullness in the right adrenal gland. No gross abnormality in the visualized liver. Musculoskeletal: Multilevel degenerative changes in the thoracic spine. No clear evidence for an osseous lesion. Degenerative changes in both shoulders. Marked sclerosis in left glenoid region is likely degenerative rather than neoplastic.  Review of the MIP images confirms the above findings. IMPRESSION: 1. Limited evaluation for pulmonary embolism but no evidence for a large central pulmonary embolism. Limited evaluation beyond the lobar arteries. 2. Large right pleural effusion with extensive pleural based nodularity throughout the right lung and a small amount of nodularity in the left lung. Findings are highly concerning for a neoplastic process and metastatic disease. Suspicion for mediastinal lymphadenopathy but limited evaluation due to the adjacent pleural fluid. 3.  Aortic Atherosclerosis (ICD10-I70.0). These results were called by telephone at the time of interpretation on 06/14/2021 at 2:55 pm to provider Cherylann Ratel , who verbally acknowledged these results. Electronically Signed   By: Markus Daft M.D.   On: 06/14/2021 14:55   CT ABDOMEN PELVIS W CONTRAST  Result Date: 06/15/2021 CLINICAL DATA:  Cancer of unknown primary. EXAM: CT ABDOMEN AND PELVIS WITH CONTRAST TECHNIQUE: Multidetector CT imaging of the abdomen and pelvis was performed using the standard protocol following bolus administration of intravenous contrast. CONTRAST:  14m OMNIPAQUE IOHEXOL 350 MG/ML SOLN COMPARISON:  June 14, 2021.  Mar 25, 2021. FINDINGS: Lower chest: Small right pleural effusion is noted with associated atelectasis. Multiple pleural based nodules are noted in the right hemothorax consistent with metastatic disease. Hepatobiliary: Cholelithiasis is noted. No biliary dilatation is noted. The liver is unremarkable. Pancreas: Unremarkable. No pancreatic ductal dilatation or surrounding inflammatory changes. Spleen: Normal in size without focal abnormality. Adrenals/Urinary Tract: Adrenal glands are unremarkable. Kidneys are normal, without renal calculi, focal lesion, or hydronephrosis. Bladder is unremarkable. Stomach/Bowel: The stomach appears normal. There is no evidence of bowel obstruction. Stool is noted throughout the colon. Patient is reportedly status  post robotic assisted rectopexy. Mild stranding is noted around the rectum consistent with postsurgical status. Surgical drain is seen entering the right upper quadrant of the abdomen with distal tip extending to the right perirectal region. Vascular/Lymphatic: Aortic atherosclerosis. No enlarged abdominal or pelvic lymph nodes. Reproductive: Status post hysterectomy. No adnexal masses. Other: Small amount of gas is seen within the subcutaneous tissues anteriorly in the pelvis consistent with recent surgery. No abnormal fluid collection is noted. No definite hernia is noted. Musculoskeletal: No acute or significant osseous findings. IMPRESSION: Patient is reportedly status post interval robotic assisted rectopexy. Surgical drain is noted extending from right upper quadrant of the abdomen with distal tip in the right perirectal region. Expected postsurgical changes are noted around the rectum. Cholelithiasis is noted. Small right pleural effusion is again noted with associated atelectasis. Multiple pleural based nodules are noted in  the visualized right hemithorax consistent with metastatic disease. Aortic Atherosclerosis (ICD10-I70.0). Electronically Signed   By: Marijo Conception M.D.   On: 06/15/2021 14:25   DG CHEST PORT 1 VIEW  Result Date: 06/27/2021 CLINICAL DATA:  Pleural effusion with worsening shortness of breath. EXAM: PORTABLE CHEST 1 VIEW COMPARISON:  Earlier same day.  06/21/2021. FINDINGS: Left chest remains clear. Right chest tube in place. Slightly less pleural fluid on the right than was seen previously. Mild cardiomegaly and aortic atherosclerosis as seen previously. No pneumothorax. IMPRESSION: Right chest tube in place. Slightly less pleural fluid than was seen previously in the right hemithorax. No pneumothorax. Left chest remains clear. Electronically Signed   By: Nelson Chimes M.D.   On: 06/27/2021 16:14   DG Chest Port 1 View  Result Date: 06/27/2021 CLINICAL DATA:  Shortness of breath in  a patient with a history of metastatic melanoma. EXAM: PORTABLE CHEST 1 VIEW COMPARISON:  Single-view of the chest 06/21/2021. FINDINGS: The patient has a large right pleural effusion which has worsened since the prior exam. Associated airspace opacity is present in the right mid and lower lung zones. The left lung appears clear. Cardiac silhouette is largely obscured. Aortic atherosclerosis noted. IMPRESSION: Large right pleural effusion has worsened since the prior exam. Right basilar airspace disease appears unchanged. Electronically Signed   By: Inge Rise M.D.   On: 06/27/2021 10:03   DG CHEST PORT 1 VIEW  Result Date: 06/17/2021 CLINICAL DATA:  Pleural effusion on right EXAM: PORTABLE CHEST 1 VIEW COMPARISON:  06/14/2021 FINDINGS: Stable to slightly increased right pleural effusion allowing for differences in technique. Right basilar atelectasis. Right perihilar adenopathy/mass. IMPRESSION: Stable to slightly increased right pleural effusion. Right basilar atelectasis. Right perihilar adenopathy or mass. Electronically Signed   By: Macy Mis M.D.   On: 06/17/2021 07:54   DG Chest Port 1 View  Result Date: 06/14/2021 CLINICAL DATA:  Status post right thoracentesis. EXAM: PORTABLE CHEST 1 VIEW COMPARISON:  Radiographs and CT earlier today FINDINGS: Decreased size of right pleural effusion post thoracentesis. Small residual pleural fluid persists. No pneumothorax. Cardiomegaly is similar. Unchanged mediastinal contours with aortic atherosclerosis. IMPRESSION: 1. Decreased size of right pleural effusion post thoracentesis. No pneumothorax. 2. Unchanged cardiomegaly. Electronically Signed   By: Keith Rake M.D.   On: 06/14/2021 17:07   DG CHEST PORT 1 VIEW  Result Date: 06/14/2021 CLINICAL DATA:  Shortness of breath. EXAM: PORTABLE CHEST 1 VIEW COMPARISON:  June 17, 2017. FINDINGS: Stable cardiomediastinal silhouette. No pneumothorax is noted. Left lung is clear. Mild right pleural  effusion is noted with associated right basilar atelectasis or infiltrate. Bony thorax is unremarkable. IMPRESSION: Mild right pleural effusion is noted with associated right basilar atelectasis or infiltrate. Aortic Atherosclerosis (ICD10-I70.0). Electronically Signed   By: Marijo Conception M.D.   On: 06/14/2021 11:51   VAS [name] LOWER EXTREMITY VENOUS (DVT)  Result Date: 06/14/2021  Lower Venous DVT Study Patient Name:  LINNIE DELGRANDE [facility]  Date of Exam:   06/14/2021 Medical Rec #: [id number]             Accession #:    [id number] Date of Birth: Nov 23, 1933             Patient Gender: F Patient Age:   39Y Exam Location:  [facility] Procedure:      VAS [name] LOWER EXTREMITY VENOUS (DVT) Referring Phys: [id number] ALICIA THOMAS --------------------------------------------------------------------------------  Indications: Swelling.  Risk Factors: None identified. Comparison Study: No prior  studies. Performing Technologist: Oliver Hum RVT  Examination Guidelines: A complete evaluation includes B-mode imaging, spectral Doppler, color Doppler, and power Doppler as needed of all accessible portions of each vessel. Bilateral testing is considered an integral part of a complete examination. Limited examinations for reoccurring indications may be performed as noted. The reflux portion of the exam is performed with the patient in reverse Trendelenburg.  +---------+---------------+---------+-----------+----------+--------------+ RIGHT    CompressibilityPhasicitySpontaneityPropertiesThrombus Aging +---------+---------------+---------+-----------+----------+--------------+ CFV      Full           Yes      Yes                                 +---------+---------------+---------+-----------+----------+--------------+ SFJ      Full                                                        +---------+---------------+---------+-----------+----------+--------------+ FV Prox  Full                                                         +---------+---------------+---------+-----------+----------+--------------+ FV Mid   Full                                                        +---------+---------------+---------+-----------+----------+--------------+ FV DistalFull                                                        +---------+---------------+---------+-----------+----------+--------------+ PFV      Full                                                        +---------+---------------+---------+-----------+----------+--------------+ POP      Full           Yes      Yes                                 +---------+---------------+---------+-----------+----------+--------------+ PTV      Full                                                        +---------+---------------+---------+-----------+----------+--------------+ PERO     Full                                                        +---------+---------------+---------+-----------+----------+--------------+   +----+---------------+---------+-----------+----------+--------------+  LEFTCompressibilityPhasicitySpontaneityPropertiesThrombus Aging +----+---------------+---------+-----------+----------+--------------+ CFV Full           Yes      Yes                                 +----+---------------+---------+-----------+----------+--------------+     Summary: RIGHT: - There is no evidence of deep vein thrombosis in the lower extremity.  - No cystic structure found in the popliteal fossa.  LEFT: - No evidence of common femoral vein obstruction.  *See table(s) above for measurements and observations. Electronically signed by Jamelle Haring on 06/14/2021 at 4:43:59 PM.    Final    US THORACENTESIS ASP PLEURAL SPACE W/IMG GUIDE  Result Date: 06/14/2021 INDICATION: Patient s/p rectopexy, with shortness of breath post-op. Found to have large right pleural effusion. Request made for diagnostic and therapeutic right  thoracentesis. EXAM: ULTRASOUND GUIDED RIGHT THORACENTESIS MEDICATIONS: 10 mL 1% lidocaine COMPLICATIONS: None immediate. PROCEDURE: An ultrasound guided thoracentesis was thoroughly discussed with the patient and questions answered. The benefits, risks, alternatives and complications were also discussed. The patient understands and wishes to proceed with the procedure. Written consent was obtained. Ultrasound was performed to localize and mark an adequate pocket of fluid in the right chest. The area was then prepped and draped in the normal sterile fashion. 1% Lidocaine was used for local anesthesia. Under ultrasound guidance a 6 Fr Safe-T-Centesis catheter was introduced. Thoracentesis was performed. The catheter was removed and a dressing applied. FINDINGS: A total of approximately 1.4 liters of bloody fluid was removed. Samples were sent to the laboratory as requested by the clinical team. IMPRESSION: Successful ultrasound guided right thoracentesis yielding 1.4 liters of pleural fluid. Read by: Brynda Greathouse PA-C Electronically Signed   By: Jacqulynn Cadet M.D.   On: 06/14/2021 16:41       The results of significant diagnostics from this hospitalization (including imaging, microbiology, ancillary and laboratory) are listed below for reference.     Microbiology: Recent Results (from the past 240 hour(s))  Resp Panel by RT-PCR (Flu A&B, Covid) Nasopharyngeal Swab     Status: None   Collection Time: 06/27/21  9:18 AM   Specimen: Nasopharyngeal Swab; Nasopharyngeal(NP) swabs in vial transport medium  Result Value Ref Range Status   SARS Coronavirus 2 by RT PCR NEGATIVE NEGATIVE Final    Comment: (NOTE) SARS-CoV-2 target nucleic acids are NOT DETECTED.  The SARS-CoV-2 RNA is generally detectable in upper respiratory specimens during the acute phase of infection. The lowest concentration of SARS-CoV-2 viral copies this assay can detect is 138 copies/mL. A negative result does not preclude  SARS-Cov-2 infection and should not be used as the sole basis for treatment or other patient management decisions. A negative result may occur with  improper specimen collection/handling, submission of specimen other than nasopharyngeal swab, presence of viral mutation(s) within the areas targeted by this assay, and inadequate number of viral copies(<138 copies/mL). A negative result must be combined with clinical observations, patient history, and epidemiological information. The expected result is Negative.  Fact Sheet for Patients:  EntrepreneurPulse.com.au  Fact Sheet for Healthcare Providers:  IncredibleEmployment.be  This test is no t yet approved or cleared by the [place] FDA and  has been authorized for detection and/or diagnosis of SARS-CoV-2 by FDA under an Emergency Use Authorization (EUA). This EUA will remain  in effect (meaning this test can be used) for the duration of the COVID-19 declaration under Section 564(b)(1) of  the Act, 21 U.S.C.section 360bbb-3(b)(1), unless the authorization is terminated  or revoked sooner.       Influenza A by PCR NEGATIVE NEGATIVE Final   Influenza B by PCR NEGATIVE NEGATIVE Final    Comment: (NOTE) The Xpert Xpress SARS-CoV-2/FLU/RSV plus assay is intended as an aid in the diagnosis of influenza from Nasopharyngeal swab specimens and should not be used as a sole basis for treatment. Nasal washings and aspirates are unacceptable for Xpert Xpress SARS-CoV-2/FLU/RSV testing.  Fact Sheet for Patients: EntrepreneurPulse.com.au  Fact Sheet for Healthcare Providers: IncredibleEmployment.be  This test is not yet approved or cleared by the [place] FDA and has been authorized for detection and/or diagnosis of SARS-CoV-2 by FDA under an Emergency Use Authorization (EUA). This EUA will remain in effect (meaning this test can be used) for the duration of  the COVID-19 declaration under Section 564(b)(1) of the Act, 21 U.S.C. section 360bbb-3(b)(1), unless the authorization is terminated or revoked.  Performed at [facility], [place] [address], [place], [place] [address]   MRSA Next Gen by PCR, Nasal     Status: None   Collection Time: 06/27/21 12:22 PM   Specimen: Nasal Mucosa; Nasal Swab  Result Value Ref Range Status   MRSA by PCR Next Gen NOT DETECTED NOT DETECTED Final    Comment: (NOTE) The GeneXpert MRSA Assay (FDA approved for NASAL specimens only), is one component of a comprehensive MRSA colonization surveillance program. It is not intended to diagnose MRSA infection nor to guide or monitor treatment for MRSA infections. Test performance is not FDA approved in patients less than 35 years old. Performed at [facility], [place] [address] [place], [place] [address]      Labs:  CBC: Recent Labs  Lab 06/27/21 [id number] 06/27/21 0939 06/27/21 1611 06/28/21 0412  WBC 11.5*  --  8.2 12.6*  NEUTROABS 9.1*  --   --   --   HGB 12.6 12.6 12.7 12.0  HCT 38.9 37.0 39.6 37.2  MCV 96.0  --  96.4 96.1  PLT 487*  --  439* 411*   BMP &GFR Recent Labs  Lab 06/27/21 0925 06/27/21 0939 06/27/21 1611 06/28/21 0412  NA 139 138  --  140  K 4.3 4.1  --  5.0  CL 104 104  --  108  CO2 26  --   --  27  GLUCOSE 154* 157*  --  122*  BUN 27* 25*  --  18  CREATININE 0.76 0.70 0.68 0.59  CALCIUM 8.9  --   --  8.6*   Estimated Creatinine Clearance: 50.3 mL/min (by C-G formula based on SCr of 0.59 mg/dL). Liver & Pancreas: Recent Labs  Lab 06/27/21 0925 06/28/21 0412  AST 25 18  ALT 14 15  ALKPHOS 57 48  BILITOT 0.8 0.6  PROT 6.5 5.7*  ALBUMIN 3.4* 2.9*   No results for input(s): LIPASE, AMYLASE in the last 168 hours. No results for input(s): AMMONIA in the last 168 hours. Diabetic: Recent Labs    06/27/21 1611  HGBA1C 6.2*   No results for input(s): GLUCAP in the last 168 hours. Cardiac  Enzymes: No results for input(s): CKTOTAL, CKMB, CKMBINDEX, TROPONINI in the last 168 hours. No results for input(s): PROBNP in the last 8760 hours. Coagulation Profile: No results for input(s): INR, PROTIME in the last 168 hours. Thyroid Function Tests: No results for input(s): TSH, T4TOTAL, FREET4, T3FREE, THYROIDAB in the last 72 hours. Lipid Profile: No results  for input(s): CHOL, HDL, LDLCALC, TRIG, CHOLHDL, LDLDIRECT in the last 72 hours. Anemia Panel: No results for input(s): VITAMINB12, FOLATE, FERRITIN, TIBC, IRON, RETICCTPCT in the last 72 hours. Urine analysis:    Component Value Date/Time   COLORURINE YELLOW 03/25/2021 1537   APPEARANCEUR HAZY (A) 03/25/2021 1537   LABSPEC 1.027 03/25/2021 1537   PHURINE 6.0 03/25/2021 1537   GLUCOSEU NEGATIVE 03/25/2021 1537   HGBUR MODERATE (A) 03/25/2021 1537   BILIRUBINUR NEGATIVE 03/25/2021 1537   KETONESUR NEGATIVE 03/25/2021 1537   PROTEINUR NEGATIVE 03/25/2021 1537   NITRITE NEGATIVE 03/25/2021 1537   LEUKOCYTESUR NEGATIVE 03/25/2021 1537   Sepsis Labs: Invalid input(s): PROCALCITONIN, LACTICIDVEN   Time coordinating discharge: 35 minutes  SIGNED:  Mercy Riding, MD  [facility] 06/29/2021, 6:23 PM  If 7PM-7AM, please contact night-coverage [email or web address]

## 2021-07-01 ENCOUNTER — Telehealth: Payer: Self-pay | Admitting: Physician Assistant

## 2021-07-01 NOTE — Telephone Encounter (Signed)
There are no open consult/Hospital appt with any provider in next two weeks. Dewald had one opening but it was in a 15 minute slot.   Will send to icard in case he would to squeeze him in. BI please advise. Thanks :)

## 2021-07-01 NOTE — Telephone Encounter (Signed)
Nurse called inquiring about how to drain her catheter and the frequency in draining it. Please advise, thanks.

## 2021-07-01 NOTE — Telephone Encounter (Signed)
Noted.  Will close encounter.  

## 2021-07-02 ENCOUNTER — Telehealth: Payer: Self-pay | Admitting: Pulmonary Disease

## 2021-07-02 NOTE — Telephone Encounter (Signed)
Called Darlene but she did not answer. Left a message for her to call us back.

## 2021-07-02 NOTE — Telephone Encounter (Signed)
Called and spoke with Peggy Howard. She stated that they have access to Epic and were able to see Dr. Juline Patch response. She is in the process of getting the paperwork ready to fax.   Nothing further needed at time of call.

## 2021-07-02 NOTE — Telephone Encounter (Signed)
Darlene from [facility] stated that they were not given any instructions to provide to the patient following her Pleurx drain that was done by Dr. Valeta Harms and she stated the patient was discharged over the weekend and she stated that she is wanting to know if Dr. Valeta Harms can sign off on the orders for them and also provided orders for what to provide to the patient and their family. Pls regard; [phone]

## 2021-07-02 NOTE — Telephone Encounter (Signed)
Peggy Howard is returning phone call. Stated will proceed with the orders. [place] phone number is [phone].

## 2021-07-02 NOTE — Telephone Encounter (Signed)
Dr. Valeta Harms, can you please advise on how often the Pleurx needs to be drained and if you are willing to sign her home health orders?

## 2021-07-04 ENCOUNTER — Encounter: Payer: Self-pay | Admitting: *Deleted

## 2021-07-04 NOTE — Telephone Encounter (Signed)
I have called the pt and LM on VM to make sure she is aware of appt with BI and that she will need to bring in a bottle with her to this appt.

## 2021-07-04 NOTE — Progress Notes (Signed)
Patient's PET Scan was cancelled due to her admission. We are still waiting on BRAF status from either her pleural effusion, or the Tempus testing that was sent 06/21/21.  Called and I was able to get PET reschedule for 07/04/2021 due to a last minute cancellation.   Spoke with [name], patient's son and he is aware of appointment including time, date and location. Also reviewed PET prep with him.   Oncology Nurse Navigator Documentation  Oncology Nurse Navigator Flowsheets 07/04/2021  Navigator Follow Up Date: 07/05/2021  Navigator Follow Up Reason: Scan Review  Navigator Location [facility]  Navigator Encounter Type Appt/Treatment Plan Review;Telephone  Telephone Outgoing Call;Appt Confirmation/Clarification  Patient Visit Type MedOnc  Treatment Phase Pre-Tx/Tx Discussion  Barriers/Navigation Needs Coordination of Care;Education  Education Other  Interventions Coordination of Care;Education  Acuity Level 2-Minimal Needs (1-2 Barriers Identified)  Coordination of Care Appts;Radiology  Education Method Verbal;Teach-back  Support Groups/Services Friends and Family  Time Spent with Patient 30

## 2021-07-05 ENCOUNTER — Other Ambulatory Visit: Payer: Self-pay | Admitting: *Deleted

## 2021-07-05 ENCOUNTER — Encounter: Payer: Self-pay | Admitting: *Deleted

## 2021-07-05 ENCOUNTER — Ambulatory Visit (HOSPITAL_COMMUNITY)
Admission: RE | Admit: 2021-07-05 | Discharge: 2021-07-05 | Disposition: A | Discharge status: Home / Self Care | Payer: Medicare Other | Source: Ambulatory Visit | Attending: Hematology & Oncology | Admitting: Hematology & Oncology

## 2021-07-05 ENCOUNTER — Other Ambulatory Visit: Payer: Self-pay

## 2021-07-05 DIAGNOSIS — R918 Other nonspecific abnormal finding of lung field: Secondary | ICD-10-CM | POA: Diagnosis not present

## 2021-07-05 DIAGNOSIS — I700 Atherosclerosis of aorta: Secondary | ICD-10-CM | POA: Insufficient documentation

## 2021-07-05 DIAGNOSIS — C439 Malignant melanoma of skin, unspecified: Secondary | ICD-10-CM | POA: Insufficient documentation

## 2021-07-05 DIAGNOSIS — R590 Localized enlarged lymph nodes: Secondary | ICD-10-CM | POA: Diagnosis not present

## 2021-07-05 DIAGNOSIS — C7801 Secondary malignant neoplasm of right lung: Secondary | ICD-10-CM

## 2021-07-05 DIAGNOSIS — K8020 Calculus of gallbladder without cholecystitis without obstruction: Secondary | ICD-10-CM | POA: Diagnosis not present

## 2021-07-05 LAB — GLUCOSE, CAPILLARY: Glucose-Capillary: 148 mg/dL — ABNORMAL HIGH (ref 70–99)

## 2021-07-05 IMAGING — CT NM PET TUM IMG INITIAL (PI) WHOLE BODY
1 of 9 series · 3 of 25 positions shown · non-contrast
Comparison: None.

CLINICAL DATA: Initial treatment strategy for metastatic melanoma.
Left ring finger primary melanoma over 1 year ago.

EXAM:
NUCLEAR MEDICINE PET WHOLE BODY
TECHNIQUE: 7.9 mCi F-18 FDG was injected intravenously. Full-ring PET imaging
was performed from the head to foot after the radiotracer. CT data
was obtained and used for attenuation correction and anatomic
localization.
Fasting blood glucose: 148 mg/dl

[Series 4: ct wb 5.0 hd_fov · axial · 5.0mm · 1.21mm/px · z∈[+224,+1084]mm · 3 of 431 slices shown]
[im 108/431  soft-tissue]
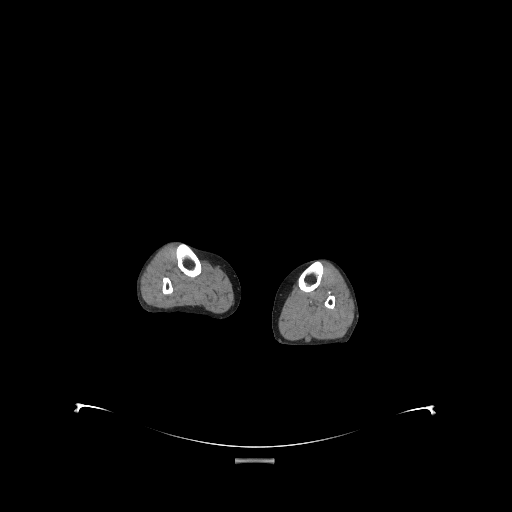
[im 216/431  soft-tissue]
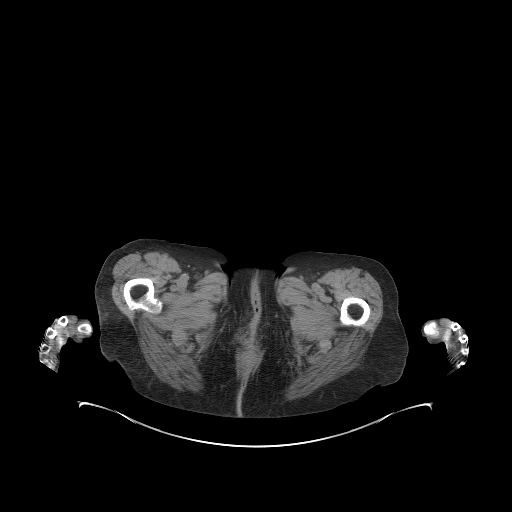
[im 323/431  soft-tissue]
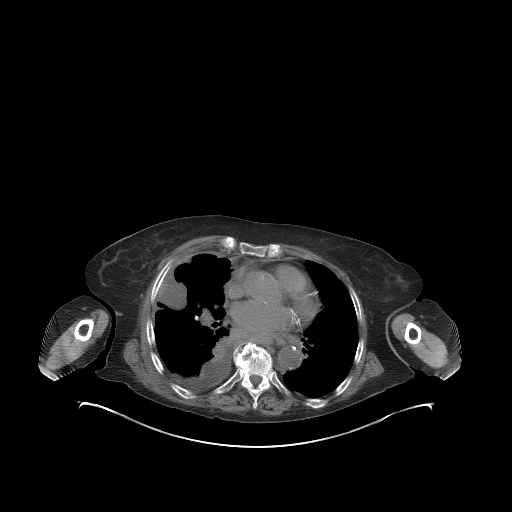

[3 of 25 positions shown; findings below may reference images not displayed]

FINDINGS: Mediastinal blood pool activity: SUV max

HEAD/NECK: No hypermetabolic activity in the scalp. No
hypermetabolic cervical lymph nodes.

Incidental CT findings: none

CHEST: Hypermetabolic mediastinal lymphadenopathy is associated with
hypermetabolic nodularity of the right pleura.

Confluent soft tissue in the subcarinal/paraesophageal region
measures 6.0 x 2.7 cm on image 107/4 demonstrating SUV max = 11.1.

2.5 x 1.7 cm pleural base nodule anteromedial right hemithorax on
92/4 demonstrates SUV max = 14.1.

Nodular soft tissue in the posterior right costophrenic sulcus is
hypermetabolic with SUV max = 11.2.

Incidental CT findings: Coronary artery calcification is evident.
Moderate atherosclerotic calcification is noted in the wall of the
thoracic aorta. Right pleural drain is positioned in the major
fissure with the tip in the region of the as ago esophageal recess
small loculated areas of right pleural fluid evident.

ABDOMEN/PELVIS: No abnormal hypermetabolic activity within the
liver, pancreas, adrenal glands, or spleen. No hypermetabolic lymph
nodes in the abdomen or pelvis.

9 mm focal cutaneous/superficial subcutaneous soft tissue nodule in
the anterior right abdominal wall (164/4) identified at the level of
the right iliac crest shows low level hypermetabolism with SUV max =
2.7.

Similar focus of apparent skin thickening in the left anterior
abdominal wall on 161/4 with low level hypermetabolism ( SUV max =
2.3.

Incidental CT findings: Tiny calcified gallstones evident. There is
moderate atherosclerotic calcification of the abdominal aorta
without aneurysm. Diverticular disease noted left colon without
diverticulitis.

SKELETON: No focal hypermetabolic activity to suggest skeletal
metastasis.

Incidental CT findings: Degenerative changes lumbar spine,
shoulders, and hips.

EXTREMITIES: No abnormal hypermetabolic activity in the lower
extremities.

Incidental CT findings: none
IMPRESSION: 1. Extensive hypermetabolic pleural nodularity in the right
hemithorax with some hypermetabolic mediastinal lymphadenopathy. No
evidence for hypermetabolic left-sided pleural disease.
2. No hypermetabolic lymphadenopathy in the neck, abdomen, or
pelvis.
3. Small bilateral foci of skin thickening in the lower anterior
abdominal wall with low level FDG accumulation. These could
represent sites of disease or be related to infection/inflammation
or even injection sites. Finding should be amenable to clinical
inspection. Cholelithiasis.
4.  Aortic Atherosclerois ([38]-170.0)

## 2021-07-05 MED ORDER — FLUDEOXYGLUCOSE F - 18 (FDG) INJECTION
8.2000 | Freq: Once | INTRAVENOUS | Status: AC | PRN
  Administered 2021-07-05: 7.9 via INTRAVENOUS

## 2021-07-05 NOTE — Progress Notes (Signed)
Patient Tempus did not show BRAF expression. Dr Marin Olp requests that I reach out to pathology lab from her initial melanoma surgery.  Contacted   [facility] at [phone] and spoke to [place]. She requires that a written request be sent to them via fax [phone].  Request completed and faxed to above number. They will pull the slide once request received and notify the office if testing can be completed.   Oncology Nurse Navigator Documentation  Oncology Nurse Navigator Flowsheets 07/05/2021  Navigator Follow Up Date: 07/08/2021  Navigator Follow Up Reason: Follow-up After Biopsy  Navigator Location [facility]  Navigator Encounter Type Molecular Studies  Telephone Outgoing Call  Patient Visit Type MedOnc  Treatment Phase Pre-Tx/Tx Discussion  Barriers/Navigation Needs Coordination of Care;Education  Education -  Interventions Coordination of Care  Acuity Level 2-Minimal Needs (1-2 Barriers Identified)  Coordination of Care Pathology  Education Method -  Support Groups/Services Friends and Family  Time Spent with Patient 30

## 2021-07-05 NOTE — Progress Notes (Signed)
Patient's Tempus Liquid Biopsy returned this morning with no indication of BRAF expression.   PET scan completed and resulted.   Both shared with Dr Marin Olp. He would like the patient to come in and be seen early next week.   Patient scheduled for follow up. Called and spoke to patient's son, [name]. He is aware of appointment including time, date and location.   Oncology Nurse Navigator Documentation  Oncology Nurse Navigator Flowsheets 07/05/2021  Navigator Follow Up Date: 07/08/2021  Navigator Follow Up Reason: Follow-up After Biopsy  Navigator Location [facility]  Navigator Encounter Type Diagnostic Results;Pathology Review;Telephone  Telephone Outgoing Call;Appt Confirmation/Clarification  Patient Visit Type MedOnc  Treatment Phase Pre-Tx/Tx Discussion  Barriers/Navigation Needs Coordination of Care;Education  Education Other  Interventions Coordination of Care;Education;Psycho-Social Support  Acuity Level 2-Minimal Needs (1-2 Barriers Identified)  Coordination of Care Appts  Education Method Verbal  Support Groups/Services Friends and Family  Time Spent with Patient 30

## 2021-07-08 ENCOUNTER — Inpatient Hospital Stay: Payer: Medicare Other | Attending: Hematology & Oncology

## 2021-07-08 ENCOUNTER — Other Ambulatory Visit: Payer: Self-pay | Admitting: *Deleted

## 2021-07-08 ENCOUNTER — Encounter: Payer: Self-pay | Admitting: *Deleted

## 2021-07-08 ENCOUNTER — Telehealth: Payer: Self-pay

## 2021-07-08 ENCOUNTER — Other Ambulatory Visit: Payer: Self-pay

## 2021-07-08 ENCOUNTER — Encounter: Payer: Self-pay | Admitting: Hematology & Oncology

## 2021-07-08 ENCOUNTER — Inpatient Hospital Stay (HOSPITAL_BASED_OUTPATIENT_CLINIC_OR_DEPARTMENT_OTHER): Payer: Medicare Other | Admitting: Hematology & Oncology

## 2021-07-08 ENCOUNTER — Telehealth: Payer: Self-pay | Admitting: Pulmonary Disease

## 2021-07-08 VITALS — BP 149/64 | HR 81 | Temp 98.0°F | Resp 19 | Wt 157.0 lb

## 2021-07-08 DIAGNOSIS — C799 Secondary malignant neoplasm of unspecified site: Secondary | ICD-10-CM | POA: Insufficient documentation

## 2021-07-08 DIAGNOSIS — C7801 Secondary malignant neoplasm of right lung: Secondary | ICD-10-CM

## 2021-07-08 DIAGNOSIS — Z79899 Other long term (current) drug therapy: Secondary | ICD-10-CM | POA: Diagnosis not present

## 2021-07-08 DIAGNOSIS — E039 Hypothyroidism, unspecified: Secondary | ICD-10-CM | POA: Diagnosis not present

## 2021-07-08 DIAGNOSIS — Z5112 Encounter for antineoplastic immunotherapy: Secondary | ICD-10-CM | POA: Insufficient documentation

## 2021-07-08 DIAGNOSIS — J90 Pleural effusion, not elsewhere classified: Secondary | ICD-10-CM | POA: Diagnosis not present

## 2021-07-08 DIAGNOSIS — C439 Malignant melanoma of skin, unspecified: Secondary | ICD-10-CM | POA: Diagnosis present

## 2021-07-08 LAB — COMPREHENSIVE METABOLIC PANEL
ALT: 13 U/L (ref 0–44)
AST: 19 U/L (ref 15–41)
Albumin: 3.6 g/dL (ref 3.5–5.0)
Alkaline Phosphatase: 57 U/L (ref 38–126)
Anion gap: 9 (ref 5–15)
BUN: 23 mg/dL (ref 8–23)
CO2: 30 mmol/L (ref 22–32)
Calcium: 9.2 mg/dL (ref 8.9–10.3)
Chloride: 103 mmol/L (ref 98–111)
Creatinine, Ser: 0.73 mg/dL (ref 0.44–1.00)
GFR, Estimated: >60 mL/min (ref >60)
Glucose, Bld: 130 mg/dL — ABNORMAL HIGH (ref 70–99)
Potassium: 3.9 mmol/L (ref 3.5–5.1)
Sodium: 142 mmol/L (ref 135–145)
Total Bilirubin: 0.6 mg/dL (ref 0.3–1.2)
Total Protein: 6.1 g/dL — ABNORMAL LOW (ref 6.5–8.1)

## 2021-07-08 LAB — CBC WITH DIFFERENTIAL (CANCER CENTER ONLY)
Abs Immature Granulocytes: 0.04 10*3/uL (ref 0.00–0.07)
Basophils Absolute: 0.1 10*3/uL (ref 0.0–0.1)
Basophils Relative: 1 %
Eosinophils Absolute: 0.1 10*3/uL (ref 0.0–0.5)
Eosinophils Relative: 1 %
HCT: 37.5 % (ref 36.0–46.0)
Hemoglobin: 12.1 g/dL (ref 12.0–15.0)
Immature Granulocytes: 0 %
Lymphocytes Relative: 22 %
Lymphs Abs: 2 10*3/uL (ref 0.7–4.0)
MCH: 30.3 pg (ref 26.0–34.0)
MCHC: 32.3 g/dL (ref 30.0–36.0)
MCV: 94 fL (ref 80.0–100.0)
Monocytes Absolute: 0.7 10*3/uL (ref 0.1–1.0)
Monocytes Relative: 8 %
Neutro Abs: 6.2 10*3/uL (ref 1.7–7.7)
Neutrophils Relative %: 68 %
Platelet Count: 344 10*3/uL (ref 150–400)
RBC: 3.99 MIL/uL (ref 3.87–5.11)
RDW: 12.9 % (ref 11.5–15.5)
WBC Count: 9.1 10*3/uL (ref 4.0–10.5)
nRBC: 0 % (ref 0.0–0.2)

## 2021-07-08 LAB — LACTATE DEHYDROGENASE: LDH: 388 U/L — ABNORMAL HIGH (ref 98–192)

## 2021-07-08 MED ORDER — PROCHLORPERAZINE MALEATE 10 MG PO TABS: 10.0000 mg | ORAL_TABLET | Freq: Four times a day (QID) | ORAL | 1 refills | Status: DC | PRN

## 2021-07-08 NOTE — Telephone Encounter (Signed)
ATC, busy tone 2

## 2021-07-08 NOTE — Progress Notes (Signed)
Patient will start single agent Keytruda. She will need to be scheduled for chemo education. Will call and schedule this with her son 07/09/21.  Oncology Nurse Navigator Documentation  Oncology Nurse Navigator Flowsheets 07/08/2021  Navigator Follow Up Date: 07/09/2021  Navigator Follow Up Reason: Appointment Review  Navigator Location [facility]  Navigator Encounter Type Appt/Treatment Plan Review  Telephone -  Patient Visit Type MedOnc  Treatment Phase Pre-Tx/Tx Discussion  Barriers/Navigation Needs Coordination of Care;Education  Education -  Interventions None Required  Acuity Level 2-Minimal Needs (1-2 Barriers Identified)  Coordination of Care -  Education Method -  Support Groups/Services Friends and Family  Time Spent with Patient 30

## 2021-07-08 NOTE — Progress Notes (Signed)
Hematology and Oncology Follow Up Visit  Peggy Howard [id number] [birth year] 87 y.o. 07/08/2021   Principle Diagnosis:  Recurrent melanoma-right pleural effusion  -- BRAFwt  Current Therapy:   Keytruda 200 mg IV q 3 wks -- start cycle #1 on 07/17/2021     Interim History:  [name] is back for follow-up.  Unfortunate, she was hospitalized about a week ago.  She had recurrence of the right pleural effusion.  This was a large effusion.  She had this is drained and a Pleurx catheter placed on 06/27/2021.  The pulmonologist removed 1 L of fluid.  She did feel a lot better.  She has the Pleurx catheter in right now.  She says that is only been drained weekly now.  We did get a liquid biopsy on her.  Unfortunately this did not show a BRAF mutation.  I am trying to see if the original tumor that she had which was a subungual melanoma over left hand would have the BRAF mutation.  We did do a PET scan on her.  This was done last week.  The PET scan, thankfully only shows activity on the right side of the chest.  She does not have any activity elsewhere.  Regardless, we have to start her on treatment.  I will start her on Keytruda.  I think this would be reasonable.  Given her maturity, I am not sure she would be a good candidate for combination immunotherapy.  Again she is incredibly vigorous.  She looks great.  She has had no complaints.  There is no issues with pain.  She has had some dizziness when she uses an inhaler.  She has had no change in bowel or bladder habits.  Of note, everything was found when she came in to have a abdominal procedure done.  She does have a little bit of swelling in the legs.  Overall, I would have to say performance status is probably ECOG 0.     Medications:  Current Outpatient Medications:    acetaminophen (TYLENOL) 500 MG tablet, Take 1,000 mg by mouth every 6 (six) hours as needed for mild pain, fever or headache., Disp: , Rfl:    albuterol  (VENTOLIN HFA) 108 (90 Base) MCG/ACT inhaler, Inhale 2 puffs into the lungs every 6 (six) hours as needed for wheezing or shortness of breath., Disp: 8 g, Rfl: 2   ferrous sulfate 325 (65 FE) MG tablet, Take 325 mg by mouth daily with breakfast., Disp: , Rfl:    fluticasone-salmeterol (ADVAIR DISKUS) 250-50 MCG/ACT AEPB, Inhale 1 puff into the lungs in the morning and at bedtime., Disp: 1 each, Rfl: 1   Multiple Vitamins-Minerals (PRESERVISION AREDS 2 PO), Take 1 tablet by mouth 2 (two) times daily., Disp: , Rfl:    psyllium (METAMUCIL SMOOTH TEXTURE) 58.6 % powder, Take 1 packet by mouth in the morning and at bedtime., Disp: , Rfl:   Allergies:  Allergies  Allergen Reactions   Vitamin D Analogs     "dizziness"    Past Medical History, Surgical history, Social history, and Family History were reviewed and updated.  Review of Systems: Review of Systems  Constitutional: Negative.   HENT:  Negative.    Eyes: Negative.   Respiratory:  Positive for shortness of breath.   Cardiovascular: Negative.   Gastrointestinal: Negative.   Endocrine: Negative.   Genitourinary: Negative.    Musculoskeletal: Negative.   Skin: Negative.   Neurological: Negative.   Psychiatric/Behavioral: Negative.  Physical Exam:  weight is 157 lb (71.2 kg). Her oral temperature is 98 F (36.7 C). Her blood pressure is 149/64 (abnormal) and her pulse is 81. Her respiration is 19 and oxygen saturation is 95%.   Wt Readings from Last 3 Encounters:  07/08/21 157 lb (71.2 kg)  06/27/21 161 lb (73 kg)  06/21/21 161 lb 1.9 oz (73.1 kg)    Physical Exam Vitals reviewed.  HENT:     Head: Normocephalic and atraumatic.  Eyes:     Pupils: Pupils are equal, round, and reactive to light.  Cardiovascular:     Rate and Rhythm: Normal rate and regular rhythm.     Heart sounds: Normal heart sounds.  Pulmonary:     Effort: Pulmonary effort is normal.     Breath sounds: Normal breath sounds.     Comments: Pulmonary  exam shows decent breath sounds bilaterally.  She may have some congestion and wheezes bilaterally.  She may have a little bit decreased breath sounds at the right lung base. Abdominal:     General: Bowel sounds are normal.     Palpations: Abdomen is soft.  Musculoskeletal:        General: No tenderness or deformity. Normal range of motion.     Cervical back: Normal range of motion.     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  This is the ring finger.  She has a amputation down to the proximal interphalangeal joint.  Lymphadenopathy:     Cervical: No cervical adenopathy.  Skin:    General: Skin is warm and dry.     Findings: No erythema or rash.  Neurological:     Mental Status: She is alert and oriented to person, place, and time.  Psychiatric:        Behavior: Behavior normal.        Thought Content: Thought content normal.        Judgment: Judgment normal.     Lab Results  Component Value Date   WBC 9.1 07/08/2021   HGB 12.1 07/08/2021   HCT 37.5 07/08/2021   MCV 94.0 07/08/2021   PLT 344 07/08/2021     Chemistry      Component Value Date/Time   NA 142 07/08/2021 1105   NA 144 07/16/2017 1029   K 3.9 07/08/2021 1105   CL 103 07/08/2021 1105   CO2 30 07/08/2021 1105   BUN 23 07/08/2021 1105   BUN 14 07/16/2017 1029   CREATININE 0.73 07/08/2021 1105   CREATININE 0.78 06/21/2021 1322      Component Value Date/Time   CALCIUM 9.2 07/08/2021 1105   ALKPHOS 57 07/08/2021 1105   AST 19 07/08/2021 1105   AST 18 06/21/2021 1322   ALT 13 07/08/2021 1105   ALT 22 06/21/2021 1322   BILITOT 0.6 07/08/2021 1105   BILITOT 0.5 06/21/2021 1322       Impression and Plan: Ms. Peggy Howard is a very charming 87 year old white female.  She has recurrent melanoma.  Again, I am not sure what the initial melanoma diagnosis was.  It was a subungual melanoma.  I am surprised that it recurred in this way.  I certainly cannot find any suspicious  skin lesions that would suggest a second primary melanoma.  We will start her on immunotherapy.  Again, I really wish that we could get tissue for the BRAF analysis.  However, there is just not enough cells in the fluid to be  able to do BRAF.  The liquid biopsy did not shows any BRAF mutation.  We will try her on pembrolizumab.  I feel this will help [name] out.  I will give her 4 cycles of pembrolizumab and then repeat her PET scan.  I am just happy that her quality life is doing so well.  She really looks fantastic.  She has been incredibly tough.  He would never know that she is 87 years old.   Volanda Napoleon, MD 8/15/202212:36 PM

## 2021-07-08 NOTE — Progress Notes (Signed)
START ON PATHWAY REGIMEN - Melanoma and Other Skin Cancers     A cycle is every 21 days:     Pembrolizumab   **Always confirm dose/schedule in your pharmacy ordering system**  Patient Characteristics: Melanoma, Cutaneous/Unknown Primary, Distant Metastases or Unresectable Local Recurrence, Unresectable, Symptomatic, First Line, BRAF V600 Wild Type / BRAF V600 Results Pending or Unknown, Candidate for Immunotherapy Disease Classification: Melanoma Disease Subtype: Cutaneous BRAF V600 Mutation Status: BRAF V600 Wild Type (No Mutation) Therapeutic Status: Distant Metastases Metastatic Disease Type: Symptomatic Line of Therapy: First Line Immunotherapy Candidate Status: Candidate for Immunotherapy Intent of Therapy: Non-Curative / Palliative Intent, Discussed with Patient

## 2021-07-08 NOTE — Telephone Encounter (Signed)
Peggy Howard stated she has a pleurix drain that per her discharge paperwork for a certain output to notify Dr. Valeta Harms on the 13th of August it was only 50 cc, and on July 03, 2021, it was only 100 cc, today 07/08/2021 10 ccs; she stated that per discharge paperwork if it was under 150 ccs 3x to notify him. Pls regard; [phone].

## 2021-07-08 NOTE — Telephone Encounter (Signed)
Patient draining less each day with their pleurx.  Sending to Dr. Valeta Harms for further recommendations.

## 2021-07-09 ENCOUNTER — Telehealth: Payer: Self-pay | Admitting: Hematology & Oncology

## 2021-07-09 ENCOUNTER — Encounter: Payer: Self-pay | Admitting: *Deleted

## 2021-07-09 NOTE — Telephone Encounter (Signed)
Ok thanks for letting me know.  He can continue to drain at least 3 times per week to maintain tube patency .  If it stops draining then we can talk about having it removed in the future.   Thanks   BLI   ATC patient unable to reach leave in triage until we contact patient.

## 2021-07-09 NOTE — Telephone Encounter (Signed)
Called and spoke with Peggy Howard, one of the staff that drains her pleurx, advised of recommendation per Dr. Valeta Harms.  She verbalized understanding.  She provided me with the last 3 dates and output from the drain: 8/10=100cc 8/13=50cc 8/15=10cc She is due to be drained on 8/17 and will send the output with the patient to her appointment on 8/19 to see Dr. Valeta Harms.  Dr. Valeta Harms, please see output amounts above, patient will bring output from drainage on 8/17 to her appointment with you on 8/19.  Thank you.

## 2021-07-09 NOTE — Progress Notes (Signed)
Called and spoke to patient's son Peggy Howard. Chemo education scheduled for 07/16/21. Also reviewed her appointments for chemo start.  Oncology Nurse Navigator Documentation  Oncology Nurse Navigator Flowsheets 07/09/2021  Planned Course of Treatment Chemotherapy  Phase of Treatment Chemo  Chemotherapy Pending- Reason: Oncologist Choice  Navigator Follow Up Date: 07/17/2021  Navigator Follow Up Reason: Chemotherapy  Navigator Location [facility]  Navigator Encounter Type Telephone  Telephone Appt Confirmation/Clarification;Outgoing Call  Patient Visit Type MedOnc  Treatment Phase Pre-Tx/Tx Discussion  Barriers/Navigation Needs Coordination of Care;Education  Education Other  Interventions Coordination of Care;Education  Acuity Level 2-Minimal Needs (1-2 Barriers Identified)  Coordination of Care Appts  Education Method Verbal  Support Groups/Services Friends and Family  Time Spent with Patient 15

## 2021-07-09 NOTE — Telephone Encounter (Signed)
Called and left a detailed message regarding appointments added per 8/15 sch msg

## 2021-07-10 NOTE — Progress Notes (Signed)
Pharmacist Chemotherapy Monitoring - Initial Assessment    Anticipated start date: 07/17/21  The following has been reviewed per standard work regarding the patient's treatment regimen: The patient's diagnosis, treatment plan and drug doses, and organ/hematologic function Lab orders and baseline tests specific to treatment regimen  The treatment plan start date, drug sequencing, and pre-medications Prior authorization status  Patient's documented medication list, including drug-drug interaction screen and prescriptions for anti-emetics and supportive care specific to the treatment regimen The drug concentrations, fluid compatibility, administration routes, and timing of the medications to be used The patient's access for treatment and lifetime cumulative dose history, if applicable  The patient's medication allergies and previous infusion related reactions, if applicable   Changes made to treatment plan:  N/A  Follow up needed:  N/A   Peggy Howard, Peggy Howard, Peggy Howard, 07/10/2021  1:50 PM

## 2021-07-11 NOTE — Progress Notes (Signed)
Synopsis: Referred in August 2022 for Pleurx catheter placement, PCP: By Lorrene Reid, PA-C  Subjective:   PATIENT ID: Peggy Howard GENDER: female DOB: [birth year], MRN: [id number]  Chief Complaint  Patient presents with   Hospitalization Follow-up    States she has been doing well since being home. She has noticed a decrease in the amount of fluid.      This is an 87 year old gentleman, past medical history of malignant pleural effusion, metastatic melanoma.  Recently admitted to the hospital.  Pleurx catheter was placed during this hospitalization by Marni Griffon, NP and Dr. Ina Homes, MD.He has established care with medical oncology.  Here today for follow-up after hospitalization.  Has seen Dr. Burney Gauze.  Initial melanoma was subungual.  Now with recurrence of malignant effusion.  Start patient recently on pembrolizumab.  OV 07/11/2021: Here today for evaluation of Pleurx catheter and site inspection.  Also needs sutures removed.  Doing well with her drainage.  Only draining about 20 cc every few days.  She has talked with her oncologist and they recommend [name] keeping this in place for the next little while as she starts treatments.   Oncology History  Melanoma metastatic to lung [facility])  06/21/2021 Initial Diagnosis   Melanoma metastatic to lung [facility])   06/21/2021 Cancer Staging   Staging form: Melanoma of the Skin, AJCC 8th Edition - Clinical stage from 06/21/2021: Stage IV (cTX, cNX, cM1b(1)) - Signed by Volanda Napoleon, MD on 06/21/2021 Stage prefix: Initial diagnosis   07/17/2021 -  Chemotherapy    Patient is on Treatment Plan: MELANOMA PEMBROLIZUMAB Q21D          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   Melanoma metastatic to lung ([place]) 06/21/2021   Rectal prolapse      No family history on file.   Past Surgical History:  Procedure Laterality Date    ABDOMINAL HYSTERECTOMY     amputation of left finger      2021   BIOPSY  03/27/2021   Procedure: BIOPSY;  Surgeon: Irving Copas., MD;  Location: [facility];  Service: Gastroenterology;;   COLONOSCOPY WITH PROPOFOL N/A 03/27/2021   Procedure: COLONOSCOPY WITH PROPOFOL;  Surgeon: Irving Copas., MD;  Location: [facility];  Service: Gastroenterology;  Laterality: N/A;   JOINT REPLACEMENT     POLYPECTOMY  03/27/2021   Procedure: POLYPECTOMY;  Surgeon: Mansouraty, Telford Nab., MD;  Location: [facility];  Service: Gastroenterology;;   REPLACEMENT TOTAL KNEE Bilateral    XI ROBOT ASSISTED RECTOPEXY N/A 06/12/2021   Procedure: XI ROBOT ASSISTED RECTOPEXY;  Surgeon: Leighton Ruff, MD;  Location: [facility];  Service: General;  Laterality: N/A;    Social History   Socioeconomic History   Marital status: Widowed    Spouse name: Not on file   Number of children: Not on file   Years of education: Not on file   Highest education level: Not on file  Occupational History   Not on file  Tobacco Use   Smoking status: Former    Packs/day: 2.00    Years: 25.00    Pack years: 50.00    Types: Cigarettes    Quit date: 66    Years since quitting: 45.6   Smokeless tobacco: Never  Vaping Use   Vaping Use: Never used  Substance and Sexual Activity   Alcohol use: No   Drug use: Yes  Types: Benzodiazepines    Comment: Denies any use. 06/21/21   Sexual activity: Never  Other Topics Concern   Not on file  Social History Narrative   Not on file   Social Determinants of Health   Financial Resource Strain: Not on file  Food Insecurity: Not on file  Transportation Needs: Not on file  Physical Activity: Not on file  Stress: Not on file  Social Connections: Not on file  Intimate Partner Violence: Not on file     Allergies  Allergen Reactions   Vitamin D Analogs     "dizziness"     Outpatient Medications Prior to Visit  Medication Sig Dispense Refill    acetaminophen (TYLENOL) 500 MG tablet Take 1,000 mg by mouth every 6 (six) hours as needed for mild pain, fever or headache.     albuterol (VENTOLIN HFA) 108 (90 Base) MCG/ACT inhaler Inhale 2 puffs into the lungs every 6 (six) hours as needed for wheezing or shortness of breath. 8 g 2   ferrous sulfate 325 (65 FE) MG tablet Take 325 mg by mouth daily with breakfast.     fluticasone-salmeterol (ADVAIR DISKUS) 250-50 MCG/ACT AEPB Inhale 1 puff into the lungs in the morning and at bedtime. 1 each 1   Multiple Vitamins-Minerals (PRESERVISION AREDS 2 PO) Take 1 tablet by mouth 2 (two) times daily.     prochlorperazine (COMPAZINE) 10 MG tablet Take 1 tablet (10 mg total) by mouth every 6 (six) hours as needed (Nausea or vomiting). 30 tablet 1   psyllium (METAMUCIL SMOOTH TEXTURE) 58.6 % powder Take 1 packet by mouth in the morning and at bedtime.     No facility-administered medications prior to visit.    Review of Systems  Constitutional:  Negative for chills, fever, malaise/fatigue and weight loss.  HENT:  Negative for hearing loss, sore throat and tinnitus.   Eyes:  Negative for blurred vision and double vision.  Respiratory:  Negative for cough, hemoptysis, sputum production, shortness of breath, wheezing and stridor.   Cardiovascular:  Negative for chest pain, palpitations, orthopnea, leg swelling and PND.  Gastrointestinal:  Negative for abdominal pain, constipation, diarrhea, heartburn, nausea and vomiting.  Genitourinary:  Negative for dysuria, hematuria and urgency.  Musculoskeletal:  Negative for joint pain and myalgias.  Skin:  Negative for itching and rash.  Neurological:  Negative for dizziness, tingling, weakness and headaches.  Endo/Heme/Allergies:  Negative for environmental allergies. Does not bruise/bleed easily.  Psychiatric/Behavioral:  Negative for depression. The patient is not nervous/anxious and does not have insomnia.   All other systems reviewed and are  negative.   Objective:  Physical Exam Vitals reviewed.  Constitutional:      General: She is not in acute distress.    Appearance: She is well-developed.  HENT:     Head: Normocephalic and atraumatic.  Eyes:     General: No scleral icterus.    Conjunctiva/sclera: Conjunctivae normal.     Pupils: Pupils are equal, round, and reactive to light.  Neck:     Vascular: No JVD.     Trachea: No tracheal deviation.  Cardiovascular:     Rate and Rhythm: Normal rate and regular rhythm.     Heart sounds: Normal heart sounds. No murmur heard. Pulmonary:     Effort: Pulmonary effort is normal. No tachypnea, accessory muscle usage or respiratory distress.     Breath sounds: No stridor. No wheezing, rhonchi or rales.     Comments: Diminshed right base Abdominal:  General: Bowel sounds are normal. There is no distension.     Palpations: Abdomen is soft.     Tenderness: There is no abdominal tenderness.  Musculoskeletal:        General: No tenderness.     Cervical back: Neck supple.  Lymphadenopathy:     Cervical: No cervical adenopathy.  Skin:    General: Skin is warm and dry.     Capillary Refill: Capillary refill takes less than 2 seconds.     Findings: No rash.  Neurological:     Mental Status: She is alert and oriented to person, place, and time.  Psychiatric:        Behavior: Behavior normal.     Vitals:   07/12/21 0854  BP: 120/72  Pulse: 78  SpO2: 94%  Weight: 157 lb 6.4 oz (71.4 kg)  Height: 5\' 4"  (1.626 m)   94% on RA BMI Readings from Last 3 Encounters:  07/12/21 27.02 kg/m  07/08/21 26.95 kg/m  06/27/21 27.64 kg/m   Wt Readings from Last 3 Encounters:  07/12/21 157 lb 6.4 oz (71.4 kg)  07/08/21 157 lb (71.2 kg)  06/27/21 161 lb (73 kg)     CBC    Component Value Date/Time   WBC 9.1 07/08/2021 1105   WBC 12.6 (H) 06/28/2021 0412   RBC 3.99 07/08/2021 1105   HGB 12.1 07/08/2021 1105   HGB 13.8 06/26/2017 1045   HCT 37.5 07/08/2021 1105   HCT  41.6 06/26/2017 1045   PLT 344 07/08/2021 1105   PLT 555 (H) 06/26/2017 1045   MCV 94.0 07/08/2021 1105   MCV 95 06/26/2017 1045   MCH 30.3 07/08/2021 1105   MCHC 32.3 07/08/2021 1105   RDW 12.9 07/08/2021 1105   RDW 13.3 06/26/2017 1045   LYMPHSABS 2.0 07/08/2021 1105   LYMPHSABS 2.0 06/26/2017 1045   MONOABS 0.7 07/08/2021 1105   EOSABS 0.1 07/08/2021 1105   EOSABS 0.1 06/26/2017 1045   BASOSABS 0.1 07/08/2021 1105   BASOSABS 0.0 06/26/2017 1045     Chest Imaging: 06/27/2021 chest x-ray: Right chest tube in place, decreased pleural fluid. The patient's images have been independently reviewed by me.    Pulmonary Functions Testing Results: No flowsheet data found.  FeNO:   Pathology:   Echocardiogram:   Heart Catheterization:     Assessment & Plan:     ICD-10-CM   1. Secondary malignant melanoma of right lung (HCC)  C78.01     2. Malignant pleural effusion  J91.0     3. Chest tube in place  Z96.89     4. Cellulitis of chest wall  L03.313       Discussion:  This is a 87 year old female, history of subungual melanoma several years ago now with recurrent metastatic melanoma to the pleural space status post indwelling pleural catheter placement.  Small area of cellulitis at the tube insertion site where it has been tunneled.  Plan: Wound check today. Suture removal planned today Continue drainage every other day for symptom relief. Should drain at least twice per week to help maintain tube patency. We will keep catheter in place until oncology feels like her malignancy treatment has stabilized.  And the effusion has not returned. Continue to monitor for any sign of site infection, erythema redness or drainage. Complete 7-day course of Augmentin for cellulitis at insertion site.  Patient to continue to monitor if it gets worse she will let us know.     Current Outpatient Medications:  acetaminophen (TYLENOL) 500 MG tablet, Take 1,000 mg by mouth every 6  (six) hours as needed for mild pain, fever or headache., Disp: , Rfl:    albuterol (VENTOLIN HFA) 108 (90 Base) MCG/ACT inhaler, Inhale 2 puffs into the lungs every 6 (six) hours as needed for wheezing or shortness of breath., Disp: 8 g, Rfl: 2   amoxicillin-clavulanate (AUGMENTIN) 875-125 MG tablet, Take 1 tablet by mouth 2 (two) times daily for 7 days., Disp: 14 tablet, Rfl: 0   ferrous sulfate 325 (65 FE) MG tablet, Take 325 mg by mouth daily with breakfast., Disp: , Rfl:    fluticasone-salmeterol (ADVAIR DISKUS) 250-50 MCG/ACT AEPB, Inhale 1 puff into the lungs in the morning and at bedtime., Disp: 1 each, Rfl: 1   Multiple Vitamins-Minerals (PRESERVISION AREDS 2 PO), Take 1 tablet by mouth 2 (two) times daily., Disp: , Rfl:    prochlorperazine (COMPAZINE) 10 MG tablet, Take 1 tablet (10 mg total) by mouth every 6 (six) hours as needed (Nausea or vomiting)., Disp: 30 tablet, Rfl: 1   psyllium (METAMUCIL SMOOTH TEXTURE) 58.6 % powder, Take 1 packet by mouth in the morning and at bedtime., Disp: , Rfl:    Garner Nash, DO  Pulmonary Critical Care 07/12/2021 9:31 AM

## 2021-07-12 ENCOUNTER — Ambulatory Visit (INDEPENDENT_AMBULATORY_CARE_PROVIDER_SITE_OTHER): Payer: Medicare Other | Admitting: Pulmonary Disease

## 2021-07-12 ENCOUNTER — Other Ambulatory Visit: Payer: Self-pay | Admitting: *Deleted

## 2021-07-12 ENCOUNTER — Other Ambulatory Visit: Payer: Self-pay

## 2021-07-12 ENCOUNTER — Encounter: Payer: Self-pay | Admitting: Pulmonary Disease

## 2021-07-12 VITALS — BP 120/72 | HR 78 | Ht 64.0 in | Wt 157.4 lb

## 2021-07-12 DIAGNOSIS — C7801 Secondary malignant neoplasm of right lung: Secondary | ICD-10-CM | POA: Diagnosis not present

## 2021-07-12 DIAGNOSIS — J910 Malignant pleural effusion: Secondary | ICD-10-CM | POA: Diagnosis not present

## 2021-07-12 DIAGNOSIS — Z9689 Presence of other specified functional implants: Secondary | ICD-10-CM | POA: Diagnosis not present

## 2021-07-12 DIAGNOSIS — L03313 Cellulitis of chest wall: Secondary | ICD-10-CM | POA: Insufficient documentation

## 2021-07-12 MED ORDER — AMOXICILLIN-POT CLAVULANATE 875-125 MG PO TABS: 1.0000 | ORAL_TABLET | Freq: Two times a day (BID) | ORAL | 0 refills | Status: AC

## 2021-07-12 NOTE — Patient Instructions (Addendum)
Thank you for visiting Dr. Valeta Harms at [facility]. Today we recommend the following:  Meds ordered this encounter  Medications   amoxicillin-clavulanate (AUGMENTIN) 875-125 MG tablet    Sig: Take 1 tablet by mouth 2 (two) times daily for 7 days.    Dispense:  14 tablet    Refill:  0   Continue drainage at least twice per week to maintain tube patency. Continue to monitor for any signs of infection, redness or drainage from the catheter insertion site.  Return in about 6 weeks (around 08/23/2021) for with APP.    Please do your part to reduce the spread of COVID-19.

## 2021-07-16 ENCOUNTER — Other Ambulatory Visit: Payer: Self-pay

## 2021-07-16 ENCOUNTER — Other Ambulatory Visit: Payer: Self-pay | Admitting: Hematology & Oncology

## 2021-07-16 ENCOUNTER — Encounter: Payer: Self-pay | Admitting: *Deleted

## 2021-07-16 ENCOUNTER — Inpatient Hospital Stay: Payer: Medicare Other

## 2021-07-16 ENCOUNTER — Other Ambulatory Visit: Payer: Self-pay | Admitting: *Deleted

## 2021-07-16 DIAGNOSIS — C7801 Secondary malignant neoplasm of right lung: Secondary | ICD-10-CM

## 2021-07-16 DIAGNOSIS — C439 Malignant melanoma of skin, unspecified: Secondary | ICD-10-CM

## 2021-07-16 NOTE — Progress Notes (Signed)
Patient in chemotherapy education class with self.  Discussed side effects of Keytruda  which include but are not limited to myelosuppression, decreased appetite, fatigue, fever, allergic or infusional reaction, mucositis, cardiac toxicity, cough, SOB, altered taste, nausea and vomiting, diarrhea, constipation, elevated LFTs myalgia and arthralgias, hair loss or thinning, rash, skin dryness, nail changes, peripheral neuropathy, discolored urine, delayed wound healing, mental changes (Chemo brain), increased risk of infections, weight loss.  Reviewed infusion room and office policy and procedure and phone numbers 24 hours x 7 days a week.  Reviewed when to call the office with any concerns or problems.  Scientist, clinical (histocompatibility and immunogenetics) given. Antiemetic protocol and chemotherapy schedule reviewed. Patient verbalized understanding of chemotherapy indications and possible side effects.  Teachback done

## 2021-07-17 ENCOUNTER — Telehealth: Payer: Self-pay | Admitting: Pulmonary Disease

## 2021-07-17 ENCOUNTER — Inpatient Hospital Stay: Payer: Medicare Other

## 2021-07-17 ENCOUNTER — Encounter: Payer: Self-pay | Admitting: *Deleted

## 2021-07-17 VITALS — BP 137/64 | HR 84 | Temp 98.0°F | Resp 18

## 2021-07-17 DIAGNOSIS — C7801 Secondary malignant neoplasm of right lung: Secondary | ICD-10-CM

## 2021-07-17 DIAGNOSIS — Z5112 Encounter for antineoplastic immunotherapy: Secondary | ICD-10-CM | POA: Diagnosis not present

## 2021-07-17 DIAGNOSIS — C439 Malignant melanoma of skin, unspecified: Secondary | ICD-10-CM

## 2021-07-17 LAB — CBC WITH DIFFERENTIAL (CANCER CENTER ONLY)
Abs Immature Granulocytes: 0.01 10*3/uL (ref 0.00–0.07)
Basophils Absolute: 0.1 10*3/uL (ref 0.0–0.1)
Basophils Relative: 1 %
Eosinophils Absolute: 0.2 10*3/uL (ref 0.0–0.5)
Eosinophils Relative: 3 %
HCT: 34.8 % — ABNORMAL LOW (ref 36.0–46.0)
Hemoglobin: 11.2 g/dL — ABNORMAL LOW (ref 12.0–15.0)
Immature Granulocytes: 0 %
Lymphocytes Relative: 27 %
Lymphs Abs: 1.8 10*3/uL (ref 0.7–4.0)
MCH: 30.3 pg (ref 26.0–34.0)
MCHC: 32.2 g/dL (ref 30.0–36.0)
MCV: 94.1 fL (ref 80.0–100.0)
Monocytes Absolute: 0.7 10*3/uL (ref 0.1–1.0)
Monocytes Relative: 10 %
Neutro Abs: 3.9 10*3/uL (ref 1.7–7.7)
Neutrophils Relative %: 59 %
Platelet Count: 416 10*3/uL — ABNORMAL HIGH (ref 150–400)
RBC: 3.7 MIL/uL — ABNORMAL LOW (ref 3.87–5.11)
RDW: 13.1 % (ref 11.5–15.5)
WBC Count: 6.6 10*3/uL (ref 4.0–10.5)
nRBC: 0 % (ref 0.0–0.2)

## 2021-07-17 LAB — CMP (CANCER CENTER ONLY)
ALT: 10 U/L (ref 0–44)
AST: 23 U/L (ref 15–41)
Albumin: 3.6 g/dL (ref 3.5–5.0)
Alkaline Phosphatase: 59 U/L (ref 38–126)
Anion gap: 9 (ref 5–15)
BUN: 14 mg/dL (ref 8–23)
CO2: 29 mmol/L (ref 22–32)
Calcium: 9.1 mg/dL (ref 8.9–10.3)
Chloride: 101 mmol/L (ref 98–111)
Creatinine: 0.76 mg/dL (ref 0.44–1.00)
GFR, Estimated: >60 mL/min (ref >60)
Glucose, Bld: 116 mg/dL — ABNORMAL HIGH (ref 70–99)
Potassium: 5.1 mmol/L (ref 3.5–5.1)
Sodium: 139 mmol/L (ref 135–145)
Total Bilirubin: 0.4 mg/dL (ref 0.3–1.2)
Total Protein: 6.6 g/dL (ref 6.5–8.1)

## 2021-07-17 MED ORDER — SODIUM CHLORIDE 0.9 % IV SOLN
200.0000 mg | Freq: Once | INTRAVENOUS | Status: AC
  Administered 2021-07-17: 200 mg via INTRAVENOUS
  Filled 2021-07-17: qty 8

## 2021-07-17 MED ORDER — SODIUM CHLORIDE 0.9 % IV SOLN: Freq: Once | INTRAVENOUS | Status: AC

## 2021-07-17 NOTE — Progress Notes (Signed)
Patient here to initiate treatment. No questions or concerns at this time. Will follow up tomorrow to assess after her first treatment.   Oncology Nurse Navigator Documentation  Oncology Nurse Navigator Flowsheets 07/17/2021  Planned Course of Treatment -  Phase of Treatment Chemo  Chemotherapy Pending- Reason: -  Chemotherapy Actual Start Date: 07/17/2021  Navigator Follow Up Date: 07/18/2021  Navigator Follow Up Reason: Other:  Navigator Location [facility]  Navigator Encounter Type Treatment  Telephone -  Treatment Initiated Date 07/17/2021  Patient Visit Type MedOnc  Treatment Phase First Chemo Tx  Barriers/Navigation Needs Coordination of Care;Education  Education -  Interventions Psycho-Social Support  Acuity Level 2-Minimal Needs (1-2 Barriers Identified)  Coordination of Care -  Education Method -  Support Groups/Services Friends and Family  Time Spent with Patient 15

## 2021-07-17 NOTE — Patient Instructions (Signed)
[facility]  Discharge Instructions: Thank you for choosing [facility] to provide your oncology and hematology care.   If you have a lab appointment with the [facility], please go directly to the [facility] and check in at the registration area.  Wear comfortable clothing and clothing appropriate for easy access to any Portacath or PICC line.   We strive to give you quality time with your provider. You may need to reschedule your appointment if you arrive late (15 or more minutes).  Arriving late affects you and other patients whose appointments are after yours.  Also, if you miss three or more appointments without notifying the office, you may be dismissed from the clinic at the provider's discretion.      For prescription refill requests, have your pharmacy contact our office and allow 72 hours for refills to be completed.    Today you received the following chemotherapy and/or immunotherapy agents Keytruda   To help prevent nausea and vomiting after your treatment, we encourage you to take your nausea medication as directed.  BELOW ARE SYMPTOMS THAT SHOULD BE REPORTED IMMEDIATELY: *FEVER GREATER THAN 100.4 F (38 C) OR HIGHER *CHILLS OR SWEATING *NAUSEA AND VOMITING THAT IS NOT CONTROLLED WITH YOUR NAUSEA MEDICATION *UNUSUAL SHORTNESS OF BREATH *UNUSUAL BRUISING OR BLEEDING *URINARY PROBLEMS (pain or burning when urinating, or frequent urination) *BOWEL PROBLEMS (unusual diarrhea, constipation, pain near the anus) TENDERNESS IN MOUTH AND THROAT WITH OR WITHOUT PRESENCE OF ULCERS (sore throat, sores in mouth, or a toothache) UNUSUAL RASH, SWELLING OR PAIN  UNUSUAL VAGINAL DISCHARGE OR ITCHING   Items with * indicate a potential emergency and should be followed up as soon as possible or go to the Emergency Department if any problems should occur.  Please show the CHEMOTHERAPY ALERT CARD or IMMUNOTHERAPY ALERT CARD at check-in to the  Emergency Department and triage nurse. Should you have questions after your visit or need to cancel or reschedule your appointment, please contact [facility]  [phone] and follow the prompts.  Office hours are 8:00 a.m. to 4:30 p.m. Monday - Friday. Please note that voicemails left after 4:00 p.m. may not be returned until the following business day.  We are closed weekends and major holidays. You have access to a nurse at all times for urgent questions. Please call the main number to the clinic [phone] and follow the prompts.  For any non-urgent questions, you may also contact your provider using MyChart. We now offer e-Visits for anyone 55 and older to request care online for non-urgent symptoms. For details visit [email or web address].   Also download the MyChart app! Go to the app store, search "MyChart", open the app, select [place], and log in with your MyChart username and password.  Due to Covid, a mask is required upon entering the hospital/clinic. If you do not have a mask, one will be given to you upon arrival. For doctor visits, patients may have 1 support person aged 46 or older with them. For treatment visits, patients cannot have anyone with them due to current Covid guidelines and our immunocompromised population.

## 2021-07-17 NOTE — Telephone Encounter (Signed)
Called and spoke with Peggy Howard. She stated that she visited Peggy Howard on Monday 07/15/21 and was only able to get a few drops of fluid from the pleurx catheter. She visited the patient again today and the patient refused the visit due to having another appointment. The patient stated that she only wanted to have the catheter drained once a week. She denied any distress on Monday and stated that she felt well today after refusing her appt.   Peggy Howard wanted to know if BI was ok with a once a week drain. I advised her that at the last office visit it was changed to twice a week but I would ask about the once a week. She verbalized understanding.   Dr. Valeta Harms, can you please advise? Thanks.

## 2021-07-17 NOTE — Telephone Encounter (Signed)
Called [place] but she did not answer. Left message for her to give our office a call back tomorrow morning after 8am.

## 2021-07-18 ENCOUNTER — Encounter: Payer: Self-pay | Admitting: *Deleted

## 2021-07-18 LAB — FUNGUS CULTURE WITH STAIN

## 2021-07-18 LAB — FUNGUS CULTURE RESULT

## 2021-07-18 LAB — TSH: TSH: 2.266 u[IU]/mL (ref 0.308–3.960)

## 2021-07-18 LAB — FUNGAL ORGANISM REFLEX

## 2021-07-18 LAB — T4: T4, Total: 6.7 ug/dL (ref 4.5–12.0)

## 2021-07-18 NOTE — Progress Notes (Signed)
Called patient to check on her after her first treatment on 07/17/2021.  She has no new complaints. She states she still has no appetite, but this has been the case for awhile. She denies any new symptoms since treatment. She slept well. No side effects.  She knows to call the office with any questions or concerns.   Oncology Nurse Navigator Documentation  Oncology Nurse Navigator Flowsheets 07/18/2021  Planned Course of Treatment -  Phase of Treatment -  Chemotherapy Pending- Reason: -  Chemotherapy Actual Start Date: -  Navigator Follow Up Date: 08/14/2021  Navigator Follow Up Reason: Follow-up Appointment;Chemotherapy  Navigator Restaurant manager, fast food Encounter Type Telephone  Telephone Patient Update;Outgoing Call  Treatment Initiated Date -  Patient Visit Type MedOnc  Treatment Phase Active Tx  Barriers/Navigation Needs Coordination of Care;Education  Education Pain/ Symptom Management  Interventions Education;Psycho-Social Support  Acuity Level 2-Minimal Needs (1-2 Barriers Identified)  Coordination of Care -  Education Method Verbal  Support Groups/Services Friends and Family  Time Spent with Patient 30

## 2021-07-19 NOTE — Telephone Encounter (Signed)
ATC Cara from [facility] [phone] to let her know that Dr. Valeta Harms would like patient to be drained once weekly. Advised her to call back with any questions or concerns. Nothing further needed.

## 2021-07-24 ENCOUNTER — Other Ambulatory Visit: Payer: Self-pay | Admitting: Hematology & Oncology

## 2021-07-24 DIAGNOSIS — C7801 Secondary malignant neoplasm of right lung: Secondary | ICD-10-CM

## 2021-08-14 ENCOUNTER — Inpatient Hospital Stay: Payer: Medicare Other

## 2021-08-14 ENCOUNTER — Encounter: Payer: Self-pay | Admitting: Hematology & Oncology

## 2021-08-14 ENCOUNTER — Encounter: Payer: Self-pay | Admitting: *Deleted

## 2021-08-14 ENCOUNTER — Ambulatory Visit (HOSPITAL_BASED_OUTPATIENT_CLINIC_OR_DEPARTMENT_OTHER)
Admission: RE | Admit: 2021-08-14 | Discharge: 2021-08-14 | Disposition: A | Discharge status: Home / Self Care | Payer: Medicare Other | Source: Ambulatory Visit | Attending: Hematology & Oncology | Admitting: Hematology & Oncology

## 2021-08-14 ENCOUNTER — Other Ambulatory Visit: Payer: Self-pay

## 2021-08-14 ENCOUNTER — Inpatient Hospital Stay: Payer: Medicare Other | Attending: Hematology & Oncology

## 2021-08-14 ENCOUNTER — Inpatient Hospital Stay (HOSPITAL_BASED_OUTPATIENT_CLINIC_OR_DEPARTMENT_OTHER): Payer: Medicare Other | Admitting: Hematology & Oncology

## 2021-08-14 VITALS — BP 104/54 | HR 90 | Temp 97.9°F | Resp 20 | Ht 64.0 in | Wt 147.8 lb

## 2021-08-14 DIAGNOSIS — J90 Pleural effusion, not elsewhere classified: Secondary | ICD-10-CM | POA: Insufficient documentation

## 2021-08-14 DIAGNOSIS — E039 Hypothyroidism, unspecified: Secondary | ICD-10-CM

## 2021-08-14 DIAGNOSIS — C7801 Secondary malignant neoplasm of right lung: Secondary | ICD-10-CM

## 2021-08-14 LAB — CBC WITH DIFFERENTIAL (CANCER CENTER ONLY)
Abs Immature Granulocytes: 0.02 10*3/uL (ref 0.00–0.07)
Basophils Absolute: 0 10*3/uL (ref 0.0–0.1)
Basophils Relative: 0 %
Eosinophils Absolute: 0.1 10*3/uL (ref 0.0–0.5)
Eosinophils Relative: 1 %
HCT: 36 % (ref 36.0–46.0)
Hemoglobin: 11.4 g/dL — ABNORMAL LOW (ref 12.0–15.0)
Immature Granulocytes: 0 %
Lymphocytes Relative: 23 %
Lymphs Abs: 1.5 10*3/uL (ref 0.7–4.0)
MCH: 28.5 pg (ref 26.0–34.0)
MCHC: 31.7 g/dL (ref 30.0–36.0)
MCV: 90 fL (ref 80.0–100.0)
Monocytes Absolute: 0.5 10*3/uL (ref 0.1–1.0)
Monocytes Relative: 7 %
Neutro Abs: 4.7 10*3/uL (ref 1.7–7.7)
Neutrophils Relative %: 69 %
Platelet Count: 354 10*3/uL (ref 150–400)
RBC: 4 MIL/uL (ref 3.87–5.11)
RDW: 13.2 % (ref 11.5–15.5)
WBC Count: 6.8 10*3/uL (ref 4.0–10.5)
nRBC: 0 % (ref 0.0–0.2)

## 2021-08-14 LAB — CMP (CANCER CENTER ONLY)
ALT: 10 U/L (ref 0–44)
AST: 32 U/L (ref 15–41)
Albumin: 3.7 g/dL (ref 3.5–5.0)
Alkaline Phosphatase: 57 U/L (ref 38–126)
Anion gap: 10 (ref 5–15)
BUN: 30 mg/dL — ABNORMAL HIGH (ref 8–23)
CO2: 27 mmol/L (ref 22–32)
Calcium: 9.3 mg/dL (ref 8.9–10.3)
Chloride: 101 mmol/L (ref 98–111)
Creatinine: 0.77 mg/dL (ref 0.44–1.00)
GFR, Estimated: >60 mL/min (ref >60)
Glucose, Bld: 126 mg/dL — ABNORMAL HIGH (ref 70–99)
Potassium: 4.4 mmol/L (ref 3.5–5.1)
Sodium: 138 mmol/L (ref 135–145)
Total Bilirubin: 0.5 mg/dL (ref 0.3–1.2)
Total Protein: 6.7 g/dL (ref 6.5–8.1)

## 2021-08-14 LAB — LACTATE DEHYDROGENASE: LDH: 609 U/L — ABNORMAL HIGH (ref 98–192)

## 2021-08-14 IMAGING — DX DG CHEST 2V
2 series · 2 of 2 positions shown · non-contrast
Comparison: [DATE].

CLINICAL DATA: Pleural effusion.  History of metastatic melanoma.

EXAM:
CHEST - 2 VIEW

[chest pa]
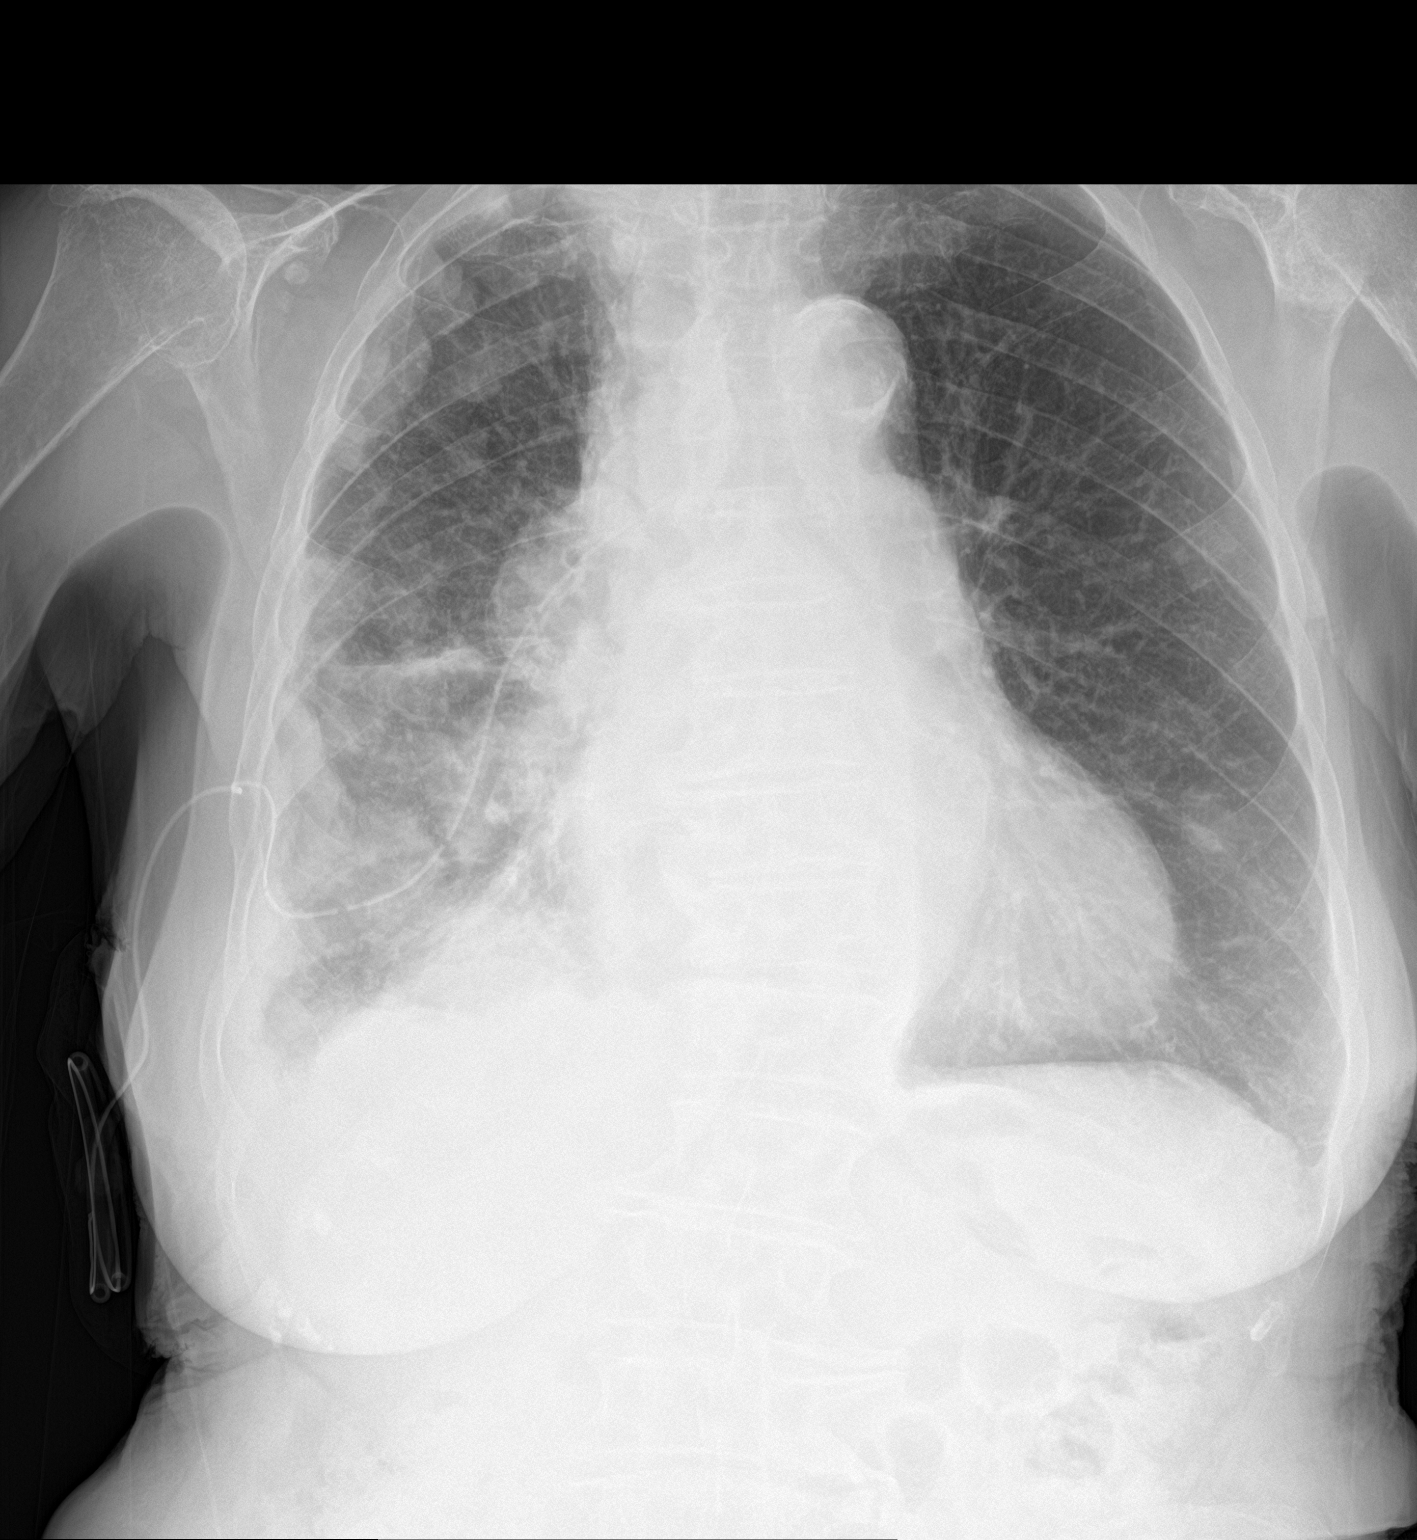

[chest lat]
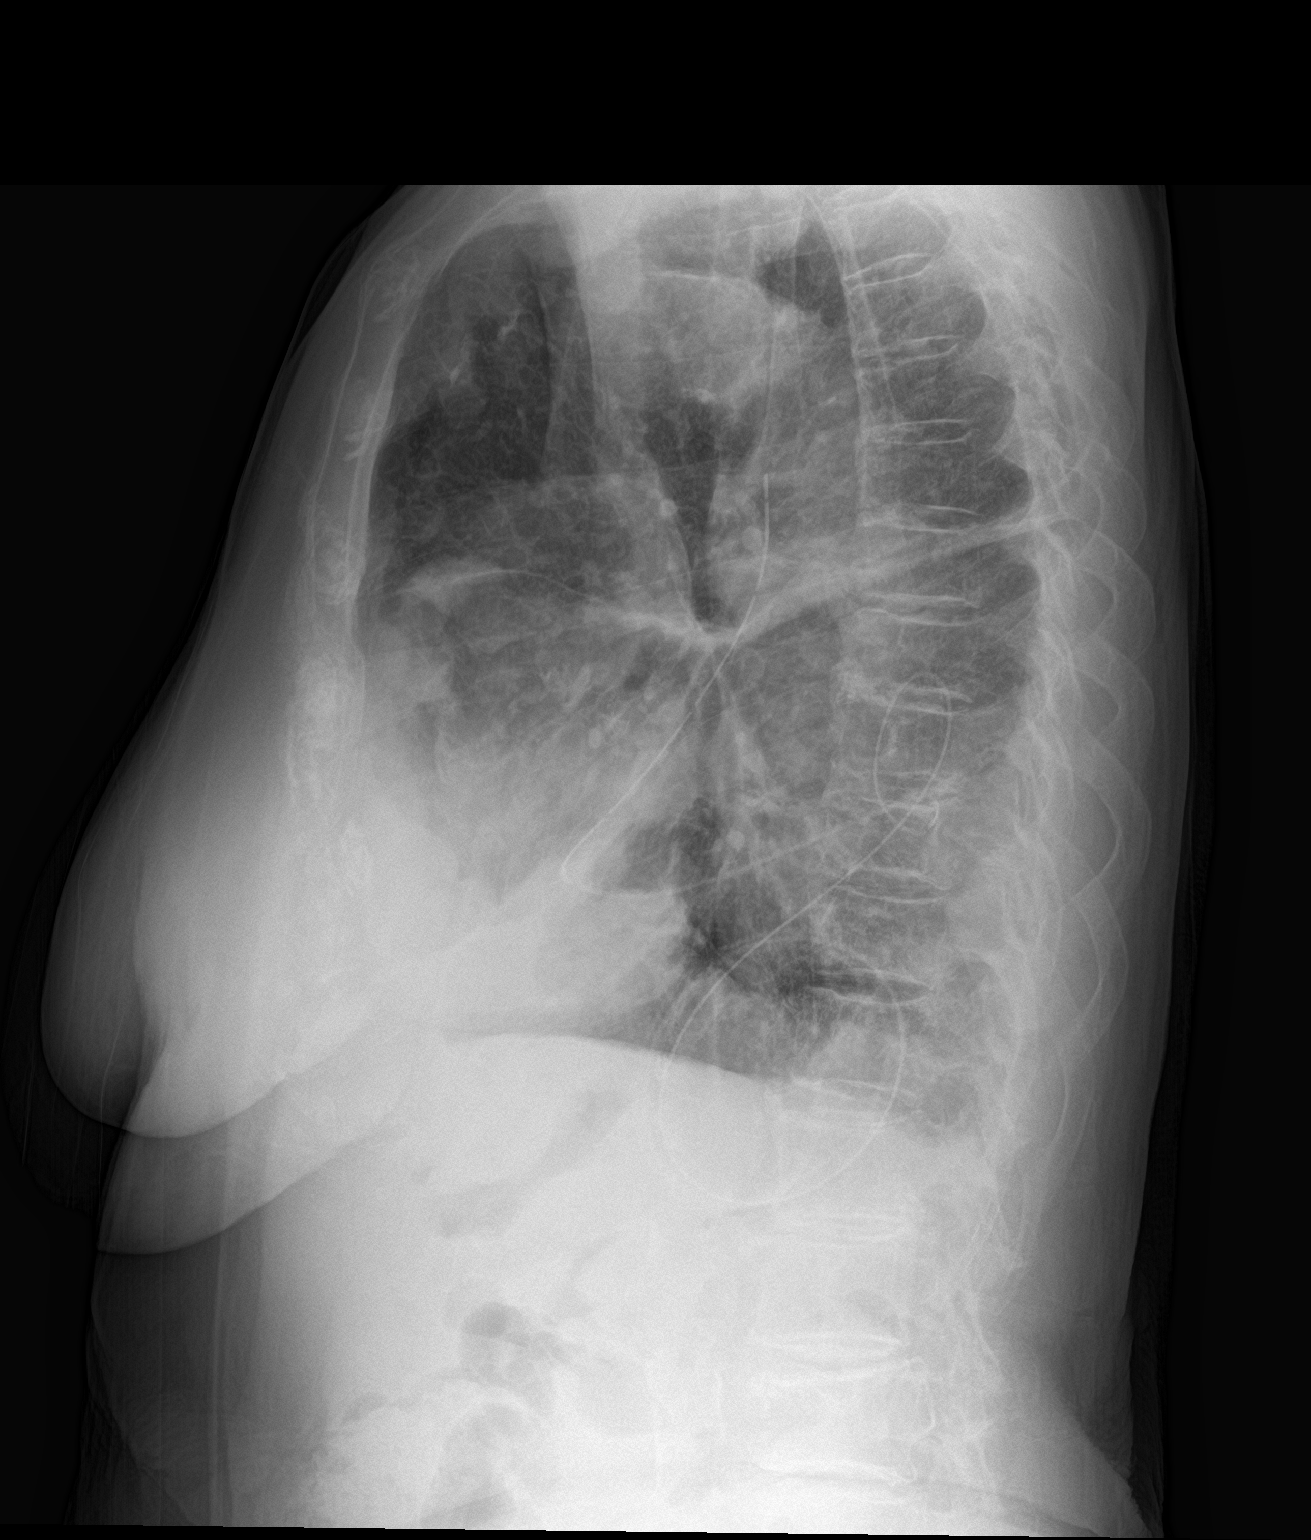

[2 of 2 positions shown; findings below may reference images not displayed]

FINDINGS: Stable cardiomegaly. No pneumothorax is noted. Multiple
pleural-based nodular densities are noted throughout the right
hemithorax consistent with pleural metastatic disease. Probable left
pulmonary nodules are noted also concerning for metastatic disease.
Right-sided pleural drainage catheter is noted. Small right pleural
effusion may be present. Bony thorax is unremarkable.
IMPRESSION: Stable position of right-sided pleural drainage catheter with small
right pleural effusion present, which is significantly smaller
compared to prior exam. However, there is interval development of
multiple pleural-based nodular densities throughout the right
hemithorax consistent with pleural metastatic disease.

## 2021-08-14 MED ORDER — IMATINIB MESYLATE 400 MG PO TABS
400.0000 mg | ORAL_TABLET | Freq: Every day | ORAL | 5 refills | Status: DC
  Filled 2021-08-14 – 2021-08-20 (×2): qty 30, 30d supply, fill #0

## 2021-08-14 NOTE — Progress Notes (Signed)
Hematology and Oncology Follow Up Visit  Peggy Howard [id number] [birth year] 87 y.o. 08/14/2021   Principle Diagnosis:  Recurrent melanoma-right pleural effusion  -- BRAFwt/ c-Kit (+)  Current Therapy:   Keytruda 200 mg IV q 3 week -- s/p cycle #1 -- d/c on 08/14/2021 for progression Gleevec 400 mg po q day -- start on 08/19/2021     Interim History:  Peggy Howard is back for follow-up.  Unfortunately I think that we have a problem now.  She is complaining of shortness of breath.  She says there is not much fluid being drained out of the Pleurx catheter.  I had her go down for a chest x-ray.  Shockingly, she seems to have fairly rapid progression with significant pleural-based nodules now.  I do believe that this is all true progression of her melanoma.  I know the subungual melanoma was concerned to be aggressive.  They may not respond to immunotherapy.  I do think we have to get a PET scan to see exactly how much she is progress.  Also like to get an MRI of the brain.  I worry that she may have small brain metastasis.  She has a little bit of dizziness.  She has had no weakness.  She has had a cough.  She had a little bit of right chest wall pain.  I just hate that we are seeing this be so aggressive.  I will have to see if we can get her on [place].  We did do a liquid biopsy on her.  She did test positive for c-kit.  As such, Gleevec may not be a bad idea.  I know that there is his trials have shown that [place] has been fairly successful for subungual melanomas.  She has had no problems with nausea or vomiting.  Her appetite is down a little bit.  Her weight is down a little bit.  She comes in with one of her sons.  I must say that all of her sons are incredibly nice.    Currently, I would say her performance status is ECOG 1-2.    Medications:  Current Outpatient Medications:    acetaminophen (TYLENOL) 500 MG tablet, Take 1,000 mg by mouth every 6 (six) hours as needed  for mild pain, fever or headache., Disp: , Rfl:    ferrous sulfate 325 (65 FE) MG tablet, Take 325 mg by mouth daily with breakfast., Disp: , Rfl:    fluticasone-salmeterol (ADVAIR DISKUS) 250-50 MCG/ACT AEPB, Inhale 1 puff into the lungs in the morning and at bedtime., Disp: 1 each, Rfl: 1   Multiple Vitamins-Minerals (PRESERVISION AREDS 2 PO), Take 1 tablet by mouth 2 (two) times daily., Disp: , Rfl:    psyllium (METAMUCIL SMOOTH TEXTURE) 58.6 % powder, Take 1 packet by mouth in the morning and at bedtime., Disp: , Rfl:    albuterol (VENTOLIN HFA) 108 (90 Base) MCG/ACT inhaler, Inhale 2 puffs into the lungs every 6 (six) hours as needed for wheezing or shortness of breath. (Patient not taking: Reported on 08/14/2021), Disp: 8 g, Rfl: 2   prochlorperazine (COMPAZINE) 10 MG tablet, TAKE 1 TABLET BY MOUTH EVERY 6 HOURS AS NEEDED (Patient not taking: Reported on 08/14/2021), Disp: 30 tablet, Rfl: 1  Allergies:  Allergies  Allergen Reactions   Vitamin D Analogs Other (See Comments)    "dizziness"    Past Medical History, Surgical history, Social history, and Family History were reviewed and updated.  Review of Systems: Review of  Systems  Constitutional: Negative.   HENT:  Negative.    Eyes: Negative.   Respiratory:  Positive for shortness of breath.   Cardiovascular: Negative.   Gastrointestinal: Negative.   Endocrine: Negative.   Genitourinary: Negative.    Musculoskeletal: Negative.   Skin: Negative.   Neurological: Negative.   Psychiatric/Behavioral: Negative.     Physical Exam:  vitals were not taken for this visit.   Wt Readings from Last 3 Encounters:  07/12/21 157 lb 6.4 oz (71.4 kg)  07/08/21 157 lb (71.2 kg)  06/27/21 161 lb (73 kg)    Physical Exam Vitals reviewed.  HENT:     Head: Normocephalic and atraumatic.  Eyes:     Pupils: Pupils are equal, round, and reactive to light.  Cardiovascular:     Rate and Rhythm: Normal rate and regular rhythm.     Heart sounds:  Normal heart sounds.  Pulmonary:     Effort: Pulmonary effort is normal.     Breath sounds: Normal breath sounds.     Comments: Pulmonary exam shows decent breath sounds bilaterally.  She may have some congestion and wheezes bilaterally.  She may have a little bit decreased breath sounds at the right lung base. Abdominal:     General: Bowel sounds are normal.     Palpations: Abdomen is soft.  Musculoskeletal:        General: No tenderness or deformity. Normal range of motion.     Cervical back: Normal range of motion.     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  This is the ring finger.  She has a amputation down to the proximal interphalangeal joint.  Lymphadenopathy:     Cervical: No cervical adenopathy.  Skin:    General: Skin is warm and dry.     Findings: No erythema or rash.  Neurological:     Mental Status: She is alert and oriented to person, place, and time.  Psychiatric:        Behavior: Behavior normal.        Thought Content: Thought content normal.        Judgment: Judgment normal.     Lab Results  Component Value Date   WBC 6.8 08/14/2021   HGB 11.4 (L) 08/14/2021   HCT 36.0 08/14/2021   MCV 90.0 08/14/2021   PLT 354 08/14/2021     Chemistry      Component Value Date/Time   NA 139 07/17/2021 1134   NA 144 07/16/2017 1029   K 5.1 07/17/2021 1134   CL 101 07/17/2021 1134   CO2 29 07/17/2021 1134   BUN 14 07/17/2021 1134   BUN 14 07/16/2017 1029   CREATININE 0.76 07/17/2021 1134      Component Value Date/Time   CALCIUM 9.1 07/17/2021 1134   ALKPHOS 59 07/17/2021 1134   AST 23 07/17/2021 1134   ALT 10 07/17/2021 1134   BILITOT 0.4 07/17/2021 1134      Impression and Plan:  Peggy Howard is a very charming 87 year old white female.  She has recurrent melanoma.  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  Again, it looks like she is progressed significantly.  We will have to see what  a PET scan can show [name].  Again we will get a MRI of the brain.  I went to see if we can get [place] on board.  Again, the melanoma is c-kit positive.  We will  try 400 mg a day.  I want to make sure that we focus on her quality of life.  This is truly important for her given that she is 87 years old.  She is still in very good shape.  Hopefully, we will not find any CNS metastasis.  I would like to get her back to see [name] in another couple weeks so we can see how she is feeling.   Volanda Napoleon, MD 9/21/20221:31 PM

## 2021-08-14 NOTE — Progress Notes (Signed)
Patient has signs of progression after only receiving one cycle of treatment. Treatment will be discontinued today.   Patient will need to be scheduled for scans and a new prescription for Gleevec has been sent to the specialty pharmacy. Once Auth is obtained, will get everything scheduled.   Oncology Nurse Navigator Documentation  Oncology Nurse Navigator Flowsheets 08/14/2021  Planned Course of Treatment -  Phase of Treatment -  Chemotherapy Pending- Reason: -  Chemotherapy Actual Start Date: -  Navigator Follow Up Date: 08/15/2021  Navigator Follow Up Reason: Appointment Review  Navigator Location [facility]  Navigator Encounter Type Follow-up Appt;Appt/Treatment Plan Review  Telephone -  Treatment Initiated Date -  Patient Visit Type MedOnc  Treatment Phase Active Tx  Barriers/Navigation Needs Coordination of Care;Education  Education -  Interventions Psycho-Social Support  Acuity Level 2-Minimal Needs (1-2 Barriers Identified)  Coordination of Care -  Education Method -  Support Groups/Services Friends and Family  Time Spent with Patient 30

## 2021-08-15 ENCOUNTER — Other Ambulatory Visit (HOSPITAL_COMMUNITY): Payer: Self-pay

## 2021-08-15 ENCOUNTER — Encounter: Payer: Self-pay | Admitting: *Deleted

## 2021-08-15 ENCOUNTER — Encounter: Payer: Self-pay | Admitting: Hematology & Oncology

## 2021-08-15 LAB — T4: T4, Total: 7.2 ug/dL (ref 4.5–12.0)

## 2021-08-15 LAB — TSH: TSH: 1.622 u[IU]/mL (ref 0.308–3.960)

## 2021-08-15 NOTE — Progress Notes (Signed)
Patient's PET and MRI authorized.   Spoke to patient's son, Glendell Docker, and he would like [name] to schedule the scans, but requests that they be on the same day.  PET scheduled for 08/27/2021 at 3p and MRI scheduled for 08/27/2021 at 5p.   Called Glendell Docker back and notified him of both appointments including time, date and location. Reviewed all prep for PET scan. Will also send radiology info sheet to patient home with all above information for reinforcement.   Glendell Docker knows to call the office with any questions or concerns.   Oncology Nurse Navigator Documentation  Oncology Nurse Navigator Flowsheets 08/15/2021  Planned Course of Treatment -  Phase of Treatment -  Chemotherapy Pending- Reason: -  Chemotherapy Actual Start Date: -  Navigator Follow Up Date: 08/27/2021  Navigator Follow Up Reason: Scan Review  Navigator Location [facility]  Navigator Encounter Type Appt/Treatment Plan Review;Telephone  Telephone Appt Confirmation/Clarification;Education;Outgoing Call  Treatment Initiated Date -  Patient Visit Type MedOnc  Treatment Phase Active Tx  Barriers/Navigation Needs Coordination of Care;Education  Education Other  Interventions Coordination of Care;Education  Acuity Level 2-Minimal Needs (1-2 Barriers Identified)  Coordination of Care Radiology  Education Method Verbal;Teach-back;Written  Support Groups/Services Friends and Family  Time Spent with Patient 6

## 2021-08-16 ENCOUNTER — Telehealth: Payer: Self-pay | Admitting: Pharmacy Technician

## 2021-08-16 ENCOUNTER — Telehealth: Payer: Self-pay | Admitting: Pharmacist

## 2021-08-16 NOTE — Telephone Encounter (Signed)
Oral Chemotherapy Pharmacist Encounter  Imatinib was approved byt the patient's insurance and is pending processing at the [facility]. Peggy Howard knows his mom is to get started on the imatinib when she has medication in hand.  Patient Education I spoke with patient's son Peggy Howard for overview of new oral chemotherapy medication: Gleevec (imatinib) for the treatment of recurrent metastatic melanoma, c-KIT exon 11 mutation positive, planned duration until disease progression or unacceptable drug toxicity.  Counseled [name] on administration, dosing, side effects, monitoring, drug-food interactions, safe handling, storage, and disposal. Patient will take 1 tablet (400 mg total) by mouth daily. Take with meals and large glass of water.  Side effects include but not limited to: rash/itchy skin, N/V, fatigue, diarrhea, edema, decrease in wbc/plt/hgb.    Reviewed with Peggy Howard importance of keeping a medication schedule and plan for any missed doses.  After discussion with Peggy Howard no patient barriers to medication adherence identified.   [name] voiced understanding and appreciation. All questions answered. Medication handout provided.  Provided [name] with Oral Chemotherapy Navigation Clinic phone number. Peggy Howard knows to call the office with questions or concerns. Oral Chemotherapy Navigation Clinic will continue to follow.  Darl Pikes, PharmD, BCPS, BCOP, CPP Hematology/Oncology Clinical Pharmacist Practitioner [facility] [phone]  08/16/2021 2:31 PM

## 2021-08-16 NOTE — Telephone Encounter (Signed)
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  CMP/CMP from 08/14/21 assessed, no relevant lab abnormalities. Prescription dose and frequency assessed.   Current medication list in Epic reviewed, one DDIs with imatinib identified: Acetaminophen: acetaminophen may enhance the hepatotoxic effect of imatinib. Recommend limiting use of acetaminophen if possible.   Evaluated chart and no patient barriers to medication adherence identified.   Prescription has been e-scribed to the [facility] for benefits analysis and approval.  Oral Oncology Clinic will continue to follow for insurance authorization, copayment issues, initial counseling and start date.   Darl Pikes, PharmD, BCPS, BCOP, CPP Hematology/Oncology Clinical Pharmacist Practitioner [facility] [phone]  08/16/2021 11:58 AM

## 2021-08-16 NOTE — Telephone Encounter (Signed)
Oral Oncology Patient Advocate Encounter  After completing a benefits investigation, prior authorization for Gleevec is not required at this time through [facility].  Prescription has been sent to [place].  Will follow up with copay and delivery status.  [place] Patient [place] Phone [phone] Fax [phone] 08/20/2021 10:07 AM

## 2021-08-19 ENCOUNTER — Other Ambulatory Visit (HOSPITAL_COMMUNITY): Payer: Self-pay

## 2021-08-20 ENCOUNTER — Other Ambulatory Visit (HOSPITAL_COMMUNITY): Payer: Self-pay

## 2021-08-20 ENCOUNTER — Other Ambulatory Visit: Payer: Self-pay

## 2021-08-20 MED ORDER — IMATINIB MESYLATE 400 MG PO TABS: 400.0000 mg | ORAL_TABLET | Freq: Every day | ORAL | 5 refills | Status: DC

## 2021-08-22 ENCOUNTER — Other Ambulatory Visit (HOSPITAL_COMMUNITY): Payer: Self-pay

## 2021-08-23 ENCOUNTER — Other Ambulatory Visit: Payer: Self-pay

## 2021-08-23 ENCOUNTER — Ambulatory Visit (INDEPENDENT_AMBULATORY_CARE_PROVIDER_SITE_OTHER): Payer: Medicare Other | Admitting: Adult Health

## 2021-08-23 ENCOUNTER — Encounter: Payer: Self-pay | Admitting: Adult Health

## 2021-08-23 DIAGNOSIS — J910 Malignant pleural effusion: Secondary | ICD-10-CM | POA: Diagnosis not present

## 2021-08-23 DIAGNOSIS — C7801 Secondary malignant neoplasm of right lung: Secondary | ICD-10-CM

## 2021-08-23 DIAGNOSIS — J9611 Chronic respiratory failure with hypoxia: Secondary | ICD-10-CM | POA: Diagnosis not present

## 2021-08-23 NOTE — Assessment & Plan Note (Signed)
Malignant pleural effusion in the setting of metastatic melanoma.  Patient's right-sided pleural effusion has decreased significantly in size.  She has no further drainage from her Pleurx catheter.  We will discuss in detail with Dr. Valeta Harms.  She has an upcoming PET scan we will evaluate effusion size on that scan.  Also decide if we need to continue with Pleurx.  Or consider Pleurx flush with lytic therapy .  Unfortunately patient progressive metastatic disease with pleural-based nodularity. She is to continue follow-up with oncology and upcoming PET scan and MRI of the brain next week.  Plan  Patient Instructions  Continue follow up Oncology as planned  PET scan and MRI next week as planned .  I will be in touch regarding your Pleurx once PET scan results are back.  Continue on Oxygen 3l/m  Order for portable oxygen tank .  Follow up with Dr. Valeta Harms in 4  weeks and As needed

## 2021-08-23 NOTE — Progress Notes (Signed)
@Patient  ID: Peggy Howard, female    DOB: 1933/04/15, 87 y.o.   MRN: [id number]  Chief Complaint  Patient presents with   Follow-up    Referring provider: No ref. provider found  HPI: 87 year old female seen for pulmonary consult during hospitalization August 2022 for malignant pleural effusion requiring Pleurx catheter . Patient has metastatic melanoma.   TEST/EVENTS :  Oncology History  Melanoma metastatic to lung ([place])  06/21/2021 Initial Diagnosis    Melanoma metastatic to lung [facility])    06/21/2021 Cancer Staging    Staging form: Melanoma of the Skin, AJCC 8th Edition - Clinical stage from 06/21/2021: Stage IV (cTX, cNX, cM1b(1)) - Signed by Volanda Napoleon, MD on 06/21/2021 Stage prefix: Initial diagnosis    07/17/2021 -  Chemotherapy     Patient is on Treatment Plan: MELANOMA PEMBROLIZUMAB Q21D      Allergies  Allergen Reactions   Vitamin D Analogs Other (See Comments)    "dizziness"   PET scan July 05, 2021--showed extensive hypermetabolic pleural nodularity in the right hemothorax with hypermetabolic mediastinal lymphadenopathy.  No evidence of left-sided pleural disease.  No hypermetabolic lymphadenopathy in the neck abdomen or pelvis.  08/23/2021 Follow up : Malignant pleural effusion, metastatic melanoma Patient returns for a 1 month follow-up.  Patient was seen for pulmonary consult last month for malignant pleural effusion.  Patient has known metastatic melanoma.  She originally had subungual melanoma on her finger.  PET scan July 05, 2021 showed extensive hypermetabolic pleural nodularity in the right hemothorax with hypermetabolic mediastinal lymphadenopathy.  Pleurx was placed last month. She has been followed by oncology.  Had been started on Keytruda but this was discontinued 08/14/2021 due to progression.  And has been started on Gleevec 400 mg daily.  By oncology.  Patient says she is received this this week and is getting ready to start. Patient  says that since last visit 1 month ago she has had no fluid drainage from her Pleurx.  She has tried at least a couple times a week without any drainage.  Chest x-ray done on August 17, 2021 showed small right pleural effusion.  Which is significantly smaller prior to previous exam.  But extensive development of multiple pleural-based nodular densities throughout the right hemothorax consistent with pleural metastatic disease.  She has been set up for a PET scan and an MRI of the brain which are scheduled for October 4. Patient is requesting to see if her Pleurx should be removed.  She has no significant pain.  She did have some localized redness at the Pleurx site and was given antibiotic last month.  She says that has improved and resolved.  She remains on oxygen at 3 L.  Patient says she does not have portable tanks.  Says she gets very short of breath with minimum activities.  She has no significant cough.  No hemoptysis.  She lives at home by herself.  Says she has family that lives close by and checks on her.  She is able to drive.  And she is able to do light housework.    There is no immunization history on file for this patient.  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   Melanoma metastatic to lung ([place]) 06/21/2021   Rectal prolapse     Tobacco History: Social History   Tobacco Use  Smoking Status Former   Packs/day: 2.00  Years: 25.00   Pack years: 50.00   Types: Cigarettes   Quit date: 76   Years since quitting: 45.7  Smokeless Tobacco Never   Counseling given: Not Answered   Outpatient Medications Prior to Visit  Medication Sig Dispense Refill   acetaminophen (TYLENOL) 500 MG tablet Take 1,000 mg by mouth every 6 (six) hours as needed for mild pain, fever or headache.     albuterol (VENTOLIN HFA) 108 (90 Base) MCG/ACT inhaler Inhale 2 puffs into the lungs every 6  (six) hours as needed for wheezing or shortness of breath. 8 g 2   ferrous sulfate 325 (65 FE) MG tablet Take 325 mg by mouth daily with breakfast.     fluticasone-salmeterol (ADVAIR DISKUS) 250-50 MCG/ACT AEPB Inhale 1 puff into the lungs in the morning and at bedtime. 1 each 1   imatinib (GLEEVEC) 400 MG tablet Take 1 tablet (400 mg total) by mouth daily. Take with meals and large glass of water.Caution:Chemotherapy. 30 tablet 5   Multiple Vitamins-Minerals (PRESERVISION AREDS 2 PO) Take 1 tablet by mouth 2 (two) times daily.     psyllium (METAMUCIL SMOOTH TEXTURE) 58.6 % powder Take 1 packet by mouth in the morning and at bedtime.     prochlorperazine (COMPAZINE) 10 MG tablet TAKE 1 TABLET BY MOUTH EVERY 6 HOURS AS NEEDED (Patient not taking: No sig reported) 30 tablet 1   No facility-administered medications prior to visit.     Review of Systems:   Constitutional:   No  weight loss, night sweats,  Fevers, chills, + fatigue, or  lassitude. HOH   HEENT:   No headaches,  Difficulty swallowing,  Tooth/dental problems, or  Sore throat,                No sneezing, itching, ear ache, nasal congestion, post nasal drip,   CV:  No chest pain,  Orthopnea, PND, swelling in lower extremities, anasarca, dizziness, palpitations, syncope.   GI  No heartburn, indigestion, abdominal pain, nausea, vomiting, diarrhea, change in bowel habits, loss of appetite, bloody stools.   Resp: ,  No coughing up of blood.  No change in color of mucus.  No wheezing.  No chest wall deformity  Skin: no rash or lesions.  GU: no dysuria, change in color of urine, no urgency or frequency.  No flank pain, no hematuria   MS:  No joint pain or swelling.  No decreased range of motion.  No back pain.    Physical Exam  BP 124/68 (BP Location: Left Arm, Patient Position: Sitting, Cuff Size: Normal)   Pulse 90   Temp 97.8 F (36.6 C) (Oral)   Ht 5\' 4"  (1.626 m)   Wt 151 lb 6.4 oz (68.7 kg)   SpO2 91%   BMI 25.99  kg/m   GEN: A/Ox3; pleasant , NAD, elderly, on oxygen   HEENT:  [place]/AT,  EACs-clear, TMs-wnl, NOSE-clear, THROAT-clear, no lesions, no postnasal drip or exudate noted.   NECK:  Supple w/ fair ROM; no JVD; normal carotid impulses w/o bruits; no thyromegaly or nodules palpated; no lymphadenopathy.    RESP  Clear  P & A; w/o, wheezes/ rales/ or rhonchi. no accessory muscle use, no dullness to percussion  CARD:  RRR, no m/r/g, no peripheral edema, pulses intact, no cyanosis or clubbing.  GI:   Soft & nt; nml bowel sounds; no organomegaly or masses detected.   Musco: Warm bil, no deformities or joint swelling noted.   Neuro: alert, no focal deficits  noted.    Skin: Warm, no lesions or rashes    Lab Results:  CBC    Component Value Date/Time   WBC 6.8 08/14/2021 1302   WBC 12.6 (H) 06/28/2021 0412   RBC 4.00 08/14/2021 1302   HGB 11.4 (L) 08/14/2021 1302   HGB 13.8 06/26/2017 1045   HCT 36.0 08/14/2021 1302   HCT 41.6 06/26/2017 1045   PLT 354 08/14/2021 1302   PLT 555 (H) 06/26/2017 1045   MCV 90.0 08/14/2021 1302   MCV 95 06/26/2017 1045   MCH 28.5 08/14/2021 1302   MCHC 31.7 08/14/2021 1302   RDW 13.2 08/14/2021 1302   RDW 13.3 06/26/2017 1045   LYMPHSABS 1.5 08/14/2021 1302   LYMPHSABS 2.0 06/26/2017 1045   MONOABS 0.5 08/14/2021 1302   EOSABS 0.1 08/14/2021 1302   EOSABS 0.1 06/26/2017 1045   BASOSABS 0.0 08/14/2021 1302   BASOSABS 0.0 06/26/2017 1045    BMET    Component Value Date/Time   NA 138 08/14/2021 1302   NA 144 07/16/2017 1029   K 4.4 08/14/2021 1302   CL 101 08/14/2021 1302   CO2 27 08/14/2021 1302   GLUCOSE 126 (H) 08/14/2021 1302   BUN 30 (H) 08/14/2021 1302   BUN 14 07/16/2017 1029   CREATININE 0.77 08/14/2021 1302   CALCIUM 9.3 08/14/2021 1302   GFRNONAA >60 08/14/2021 1302   GFRAA 81 07/16/2017 1029    BNP    Component Value Date/Time   BNP 35.5 06/27/2021 0925    ProBNP No results found for: PROBNP  Imaging: DG Chest 2  View  Result Date: 08/14/2021 CLINICAL DATA:  Pleural effusion.  History of metastatic melanoma. EXAM: CHEST - 2 VIEW COMPARISON:  June 27, 2021. FINDINGS: Stable cardiomegaly. No pneumothorax is noted. Multiple pleural-based nodular densities are noted throughout the right hemithorax consistent with pleural metastatic disease. Probable left pulmonary nodules are noted also concerning for metastatic disease. Right-sided pleural drainage catheter is noted. Small right pleural effusion may be present. Bony thorax is unremarkable. IMPRESSION: Stable position of right-sided pleural drainage catheter with small right pleural effusion present, which is significantly smaller compared to prior exam. However, there is interval development of multiple pleural-based nodular densities throughout the right hemithorax consistent with pleural metastatic disease. Electronically Signed   By: Marijo Conception M.D.   On: 08/14/2021 14:30    pembrolizumab (KEYTRUDA) 200 mg in sodium chloride 0.9 % 50 mL chemo infusion     Date Action Dose Route User   07/17/2021 1324 Rate/Dose Change (none) Intravenous Hill, Tiffany A, LPN   5/63/8756 4332 Rate/Dose Change (none) Intravenous Hill, Tiffany A, LPN   9/51/8841 6606 New Bag/Given 200 mg Intravenous Lucile Crater, RN      0.9 %  sodium chloride infusion     Date Action Dose Route User   07/17/2021 1324 Rate/Dose Change (none) Intravenous Bevelyn Ngo, LPN   01/22/6009 9323 New Bag/Given (none) Intravenous Randolm Idol, RN       No flowsheet data found.  No results found for: NITRICOXIDE      Assessment & Plan:   Malignant pleural effusion Malignant pleural effusion in the setting of metastatic melanoma.  Patient's right-sided pleural effusion has decreased significantly in size.  She has no further drainage from her Pleurx catheter.  We will discuss in detail with Dr. Valeta Harms.  She has an upcoming PET scan we will evaluate effusion size on that scan.  Also  decide if we need to continue  with Pleurx.  Or consider Pleurx flush with lytic therapy .  Unfortunately patient progressive metastatic disease with pleural-based nodularity. She is to continue follow-up with oncology and upcoming PET scan and MRI of the brain next week.  Plan  Patient Instructions  Continue follow up Oncology as planned  PET scan and MRI next week as planned .  I will be in touch regarding your Pleurx once PET scan results are back.  Continue on Oxygen 3l/m  Order for portable oxygen tank .  Follow up with Dr. Valeta Harms in 4  weeks and As needed        Melanoma metastatic to lung [facility]) Metastatic melanoma.  Continue follow-up with oncology and plan PET for next week  Chronic respiratory failure with hypoxia (HCC) Chronic respiratory failure.  Continue on oxygen 3 L.  Order for portable tanks.     Rexene Edison, NP 08/23/2021

## 2021-08-23 NOTE — Patient Instructions (Addendum)
Continue follow up Oncology as planned  PET scan and MRI next week as planned .  I will be in touch regarding your Pleurx once PET scan results are back.  Continue on Oxygen 3l/m  Order for portable oxygen tank .  Follow up with Dr. Valeta Harms in 4  weeks and As needed

## 2021-08-23 NOTE — Addendum Note (Signed)
Addended by: Elby Beck R on: 08/23/2021 04:28 PM   Modules accepted: Orders

## 2021-08-23 NOTE — H&P (View-Only) (Signed)
@Patient  ID: Peggy Howard, female    DOB: 1932/12/26, 87 y.o.   MRN: [id number]  Chief Complaint  Patient presents with   Follow-up    Referring provider: No ref. provider found  HPI: 86 year old female seen for pulmonary consult during hospitalization August 2022 for malignant pleural effusion requiring Pleurx catheter . Patient has metastatic melanoma.   TEST/EVENTS :  Oncology History  Melanoma metastatic to lung ([place])  06/21/2021 Initial Diagnosis    Melanoma metastatic to lung [facility])    06/21/2021 Cancer Staging    Staging form: Melanoma of the Skin, AJCC 8th Edition - Clinical stage from 06/21/2021: Stage IV (cTX, cNX, cM1b(1)) - Signed by Volanda Napoleon, MD on 06/21/2021 Stage prefix: Initial diagnosis    07/17/2021 -  Chemotherapy     Patient is on Treatment Plan: MELANOMA PEMBROLIZUMAB Q21D      Allergies  Allergen Reactions   Vitamin D Analogs Other (See Comments)    "dizziness"   PET scan July 05, 2021--showed extensive hypermetabolic pleural nodularity in the right hemothorax with hypermetabolic mediastinal lymphadenopathy.  No evidence of left-sided pleural disease.  No hypermetabolic lymphadenopathy in the neck abdomen or pelvis.  08/23/2021 Follow up : Malignant pleural effusion, metastatic melanoma Patient returns for a 1 month follow-up.  Patient was seen for pulmonary consult last month for malignant pleural effusion.  Patient has known metastatic melanoma.  She originally had subungual melanoma on her finger.  PET scan July 05, 2021 showed extensive hypermetabolic pleural nodularity in the right hemothorax with hypermetabolic mediastinal lymphadenopathy.  Pleurx was placed last month. She has been followed by oncology.  Had been started on Keytruda but this was discontinued 08/14/2021 due to progression.  And has been started on Gleevec 400 mg daily.  By oncology.  Patient says she is received this this week and is getting ready to start. Patient  says that since last visit 1 month ago she has had no fluid drainage from her Pleurx.  She has tried at least a couple times a week without any drainage.  Chest x-ray done on August 17, 2021 showed small right pleural effusion.  Which is significantly smaller prior to previous exam.  But extensive development of multiple pleural-based nodular densities throughout the right hemothorax consistent with pleural metastatic disease.  She has been set up for a PET scan and an MRI of the brain which are scheduled for October 4. Patient is requesting to see if her Pleurx should be removed.  She has no significant pain.  She did have some localized redness at the Pleurx site and was given antibiotic last month.  She says that has improved and resolved.  She remains on oxygen at 3 L.  Patient says she does not have portable tanks.  Says she gets very short of breath with minimum activities.  She has no significant cough.  No hemoptysis.  She lives at home by herself.  Says she has family that lives close by and checks on her.  She is able to drive.  And she is able to do light housework.    There is no immunization history on file for this patient.  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   Melanoma metastatic to lung ([place]) 06/21/2021   Rectal prolapse     Tobacco History: Social History   Tobacco Use  Smoking Status Former   Packs/day: 2.00  Years: 25.00   Pack years: 50.00   Types: Cigarettes   Quit date: 54   Years since quitting: 45.7  Smokeless Tobacco Never   Counseling given: Not Answered   Outpatient Medications Prior to Visit  Medication Sig Dispense Refill   acetaminophen (TYLENOL) 500 MG tablet Take 1,000 mg by mouth every 6 (six) hours as needed for mild pain, fever or headache.     albuterol (VENTOLIN HFA) 108 (90 Base) MCG/ACT inhaler Inhale 2 puffs into the lungs every 6  (six) hours as needed for wheezing or shortness of breath. 8 g 2   ferrous sulfate 325 (65 FE) MG tablet Take 325 mg by mouth daily with breakfast.     fluticasone-salmeterol (ADVAIR DISKUS) 250-50 MCG/ACT AEPB Inhale 1 puff into the lungs in the morning and at bedtime. 1 each 1   imatinib (GLEEVEC) 400 MG tablet Take 1 tablet (400 mg total) by mouth daily. Take with meals and large glass of water.Caution:Chemotherapy. 30 tablet 5   Multiple Vitamins-Minerals (PRESERVISION AREDS 2 PO) Take 1 tablet by mouth 2 (two) times daily.     psyllium (METAMUCIL SMOOTH TEXTURE) 58.6 % powder Take 1 packet by mouth in the morning and at bedtime.     prochlorperazine (COMPAZINE) 10 MG tablet TAKE 1 TABLET BY MOUTH EVERY 6 HOURS AS NEEDED (Patient not taking: No sig reported) 30 tablet 1   No facility-administered medications prior to visit.     Review of Systems:   Constitutional:   No  weight loss, night sweats,  Fevers, chills, + fatigue, or  lassitude. HOH   HEENT:   No headaches,  Difficulty swallowing,  Tooth/dental problems, or  Sore throat,                No sneezing, itching, ear ache, nasal congestion, post nasal drip,   CV:  No chest pain,  Orthopnea, PND, swelling in lower extremities, anasarca, dizziness, palpitations, syncope.   GI  No heartburn, indigestion, abdominal pain, nausea, vomiting, diarrhea, change in bowel habits, loss of appetite, bloody stools.   Resp: ,  No coughing up of blood.  No change in color of mucus.  No wheezing.  No chest wall deformity  Skin: no rash or lesions.  GU: no dysuria, change in color of urine, no urgency or frequency.  No flank pain, no hematuria   MS:  No joint pain or swelling.  No decreased range of motion.  No back pain.    Physical Exam  BP 124/68 (BP Location: Left Arm, Patient Position: Sitting, Cuff Size: Normal)   Pulse 90   Temp 97.8 F (36.6 C) (Oral)   Ht 5\' 4"  (1.626 m)   Wt 151 lb 6.4 oz (68.7 kg)   SpO2 91%   BMI 25.99  kg/m   GEN: A/Ox3; pleasant , NAD, elderly, on oxygen   HEENT:  [place]/AT,  EACs-clear, TMs-wnl, NOSE-clear, THROAT-clear, no lesions, no postnasal drip or exudate noted.   NECK:  Supple w/ fair ROM; no JVD; normal carotid impulses w/o bruits; no thyromegaly or nodules palpated; no lymphadenopathy.    RESP  Clear  P & A; w/o, wheezes/ rales/ or rhonchi. no accessory muscle use, no dullness to percussion  CARD:  RRR, no m/r/g, no peripheral edema, pulses intact, no cyanosis or clubbing.  GI:   Soft & nt; nml bowel sounds; no organomegaly or masses detected.   Musco: Warm bil, no deformities or joint swelling noted.   Neuro: alert, no focal deficits  noted.    Skin: Warm, no lesions or rashes    Lab Results:  CBC    Component Value Date/Time   WBC 6.8 08/14/2021 1302   WBC 12.6 (H) 06/28/2021 0412   RBC 4.00 08/14/2021 1302   HGB 11.4 (L) 08/14/2021 1302   HGB 13.8 06/26/2017 1045   HCT 36.0 08/14/2021 1302   HCT 41.6 06/26/2017 1045   PLT 354 08/14/2021 1302   PLT 555 (H) 06/26/2017 1045   MCV 90.0 08/14/2021 1302   MCV 95 06/26/2017 1045   MCH 28.5 08/14/2021 1302   MCHC 31.7 08/14/2021 1302   RDW 13.2 08/14/2021 1302   RDW 13.3 06/26/2017 1045   LYMPHSABS 1.5 08/14/2021 1302   LYMPHSABS 2.0 06/26/2017 1045   MONOABS 0.5 08/14/2021 1302   EOSABS 0.1 08/14/2021 1302   EOSABS 0.1 06/26/2017 1045   BASOSABS 0.0 08/14/2021 1302   BASOSABS 0.0 06/26/2017 1045    BMET    Component Value Date/Time   NA 138 08/14/2021 1302   NA 144 07/16/2017 1029   K 4.4 08/14/2021 1302   CL 101 08/14/2021 1302   CO2 27 08/14/2021 1302   GLUCOSE 126 (H) 08/14/2021 1302   BUN 30 (H) 08/14/2021 1302   BUN 14 07/16/2017 1029   CREATININE 0.77 08/14/2021 1302   CALCIUM 9.3 08/14/2021 1302   GFRNONAA >60 08/14/2021 1302   GFRAA 81 07/16/2017 1029    BNP    Component Value Date/Time   BNP 35.5 06/27/2021 0925    ProBNP No results found for: PROBNP  Imaging: DG Chest 2  View  Result Date: 08/14/2021 CLINICAL DATA:  Pleural effusion.  History of metastatic melanoma. EXAM: CHEST - 2 VIEW COMPARISON:  June 27, 2021. FINDINGS: Stable cardiomegaly. No pneumothorax is noted. Multiple pleural-based nodular densities are noted throughout the right hemithorax consistent with pleural metastatic disease. Probable left pulmonary nodules are noted also concerning for metastatic disease. Right-sided pleural drainage catheter is noted. Small right pleural effusion may be present. Bony thorax is unremarkable. IMPRESSION: Stable position of right-sided pleural drainage catheter with small right pleural effusion present, which is significantly smaller compared to prior exam. However, there is interval development of multiple pleural-based nodular densities throughout the right hemithorax consistent with pleural metastatic disease. Electronically Signed   By: Marijo Conception M.D.   On: 08/14/2021 14:30    pembrolizumab (KEYTRUDA) 200 mg in sodium chloride 0.9 % 50 mL chemo infusion     Date Action Dose Route User   07/17/2021 1324 Rate/Dose Change (none) Intravenous Hill, Tiffany A, LPN   2/50/5397 6734 Rate/Dose Change (none) Intravenous Hill, Tiffany A, LPN   1/93/7902 4097 New Bag/Given 200 mg Intravenous Lucile Crater, RN      0.9 %  sodium chloride infusion     Date Action Dose Route User   07/17/2021 1324 Rate/Dose Change (none) Intravenous Bevelyn Ngo, LPN   3/53/2992 4268 New Bag/Given (none) Intravenous Randolm Idol, RN       No flowsheet data found.  No results found for: NITRICOXIDE      Assessment & Plan:   Malignant pleural effusion Malignant pleural effusion in the setting of metastatic melanoma.  Patient's right-sided pleural effusion has decreased significantly in size.  She has no further drainage from her Pleurx catheter.  We will discuss in detail with Dr. Valeta Harms.  She has an upcoming PET scan we will evaluate effusion size on that scan.  Also  decide if we need to continue  with Pleurx.  Or consider Pleurx flush with lytic therapy .  Unfortunately patient progressive metastatic disease with pleural-based nodularity. She is to continue follow-up with oncology and upcoming PET scan and MRI of the brain next week.  Plan  Patient Instructions  Continue follow up Oncology as planned  PET scan and MRI next week as planned .  I will be in touch regarding your Pleurx once PET scan results are back.  Continue on Oxygen 3l/m  Order for portable oxygen tank .  Follow up with Dr. Valeta Harms in 4  weeks and As needed        Melanoma metastatic to lung [facility]) Metastatic melanoma.  Continue follow-up with oncology and plan PET for next week  Chronic respiratory failure with hypoxia (HCC) Chronic respiratory failure.  Continue on oxygen 3 L.  Order for portable tanks.     Rexene Edison, NP 08/23/2021

## 2021-08-23 NOTE — Telephone Encounter (Signed)
Per [facility] website, Gleevec was delivered to patient on 08/21/21.  [place] Patient Peggy Howard Phone [phone] Fax [phone] 08/23/2021 8:59 AM

## 2021-08-23 NOTE — Assessment & Plan Note (Signed)
Chronic respiratory failure.  Continue on oxygen 3 L.  Order for portable tanks.

## 2021-08-23 NOTE — Assessment & Plan Note (Signed)
Metastatic melanoma.  Continue follow-up with oncology and plan PET for next week

## 2021-08-27 ENCOUNTER — Other Ambulatory Visit: Payer: Self-pay

## 2021-08-27 ENCOUNTER — Ambulatory Visit (HOSPITAL_COMMUNITY)
Admission: RE | Admit: 2021-08-27 | Discharge: 2021-08-27 | Disposition: A | Discharge status: Home / Self Care | Payer: Medicare Other | Source: Ambulatory Visit | Attending: Hematology & Oncology | Admitting: Hematology & Oncology

## 2021-08-27 DIAGNOSIS — I700 Atherosclerosis of aorta: Secondary | ICD-10-CM | POA: Diagnosis not present

## 2021-08-27 DIAGNOSIS — I2510 Atherosclerotic heart disease of native coronary artery without angina pectoris: Secondary | ICD-10-CM | POA: Diagnosis not present

## 2021-08-27 DIAGNOSIS — R609 Edema, unspecified: Secondary | ICD-10-CM | POA: Diagnosis not present

## 2021-08-27 DIAGNOSIS — C7801 Secondary malignant neoplasm of right lung: Secondary | ICD-10-CM | POA: Diagnosis not present

## 2021-08-27 DIAGNOSIS — R918 Other nonspecific abnormal finding of lung field: Secondary | ICD-10-CM | POA: Insufficient documentation

## 2021-08-27 LAB — GLUCOSE, CAPILLARY: Glucose-Capillary: 133 mg/dL — ABNORMAL HIGH (ref 70–99)

## 2021-08-27 IMAGING — CT NM PET IMAGE RESTAGE (PS) WHOLE BODY
1 of 7 series · 4 of 25 positions shown · non-contrast
Comparison: PET-CT dated [DATE].

CLINICAL DATA: Follow-up treatment strategy for melanoma.

EXAM:
NUCLEAR MEDICINE PET WHOLE BODY
TECHNIQUE: 8.0 mCi F-18 FDG was injected intravenously. Full-ring PET imaging
was performed from the head to foot after the radiotracer. CT data
was obtained and used for attenuation correction and anatomic
localization.
Fasting blood glucose: 133 mg/dl

[Series 4: ct wb 5.0 hd_fov · axial · 5.0mm · 1.52mm/px · z∈[-316,+724]mm · 4 of 434 slices shown]
[im 87/434  soft-tissue]
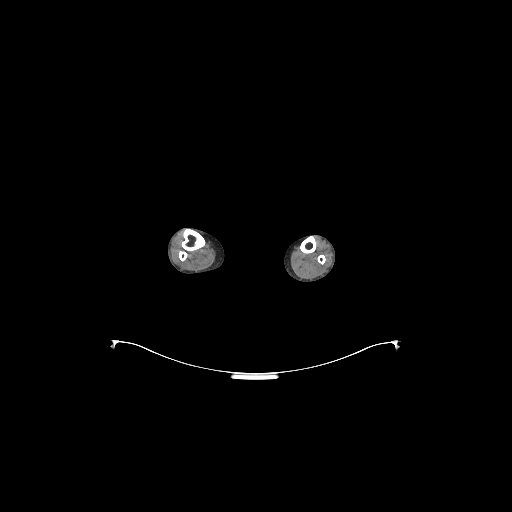
[im 174/434  soft-tissue]
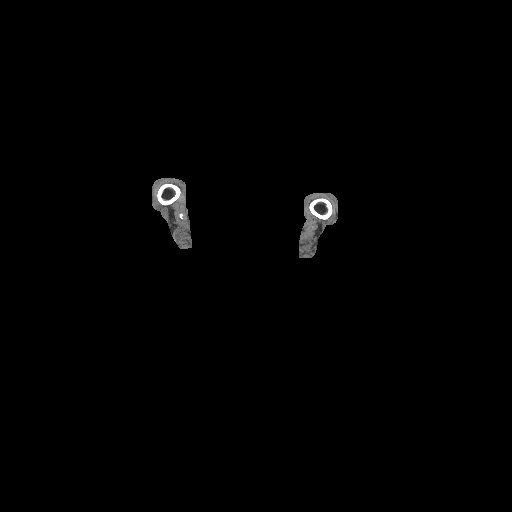
[im 260/434  soft-tissue]
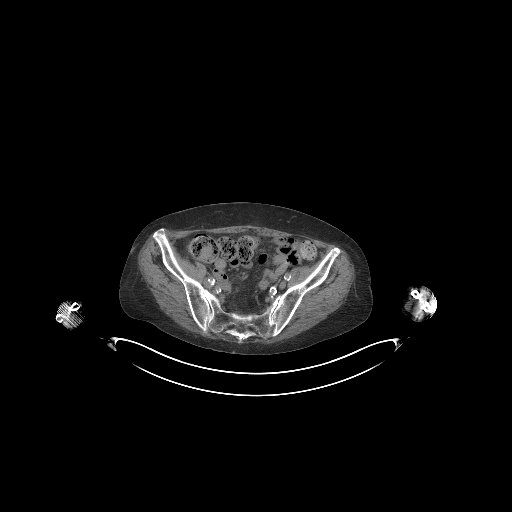
[im 347/434  soft-tissue]
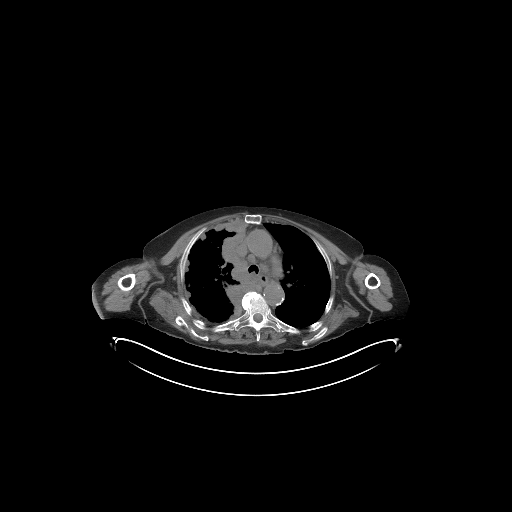

[4 of 25 positions shown; findings below may reference images not displayed]

FINDINGS: Mediastinal blood pool activity: SUV max

HEAD/NECK: No hypermetabolic activity in the scalp. No
hypermetabolic cervical lymph nodes.

Incidental CT findings: none

CHEST: Near circumferential right pleural nodularity, increased
compared to prior exam, particularly in the upper lung. Reference
new hypermetabolic right apical pleural nodule located adjacent to
the mediastinum measuring 3.2 x 2.3 cm on series 4, image 77 with an
SUV max of 6.4. Hypermetabolic activity of the right chest wall near
the entry site of the chest tube, likely reactive. Similar
hypermetabolic mediastinal adenopathy. Solid pulmonary nodules are
seen throughout the right lung which are new and increased in size
compared to prior exam. Reference new solid nodule of the right
middle lobe measuring 9 mm on series 8, image 36 with an SUV max of
2.6. Increased size of left lower lobe pleural nodule measuring
x 1.0 cm with an SUV max of 1.6, located on series 8, image 41,
previously measured 5 mm. Small loculated right pleural effusion
with chest tube in place.

Incidental CT findings: Right lower lobe bronchial wall thickening
and new ground-glass opacities of the right hemithorax, most severe
in the right lower lobe. Aortic atherosclerotic disease and coronary
artery calcifications of the LAD and circumflex. No pericardial
effusion.

ABDOMEN/PELVIS: No abnormal hypermetabolic activity within the
liver, pancreas, adrenal glands, or spleen. No hypermetabolic lymph
nodes in the abdomen or pelvis.

Incidental CT findings: Cholelithiasis with no gallbladder wall
thickening. Pancreas, spleen, and bilateral adrenal glands are
unremarkable. No hydronephrosis or nephrolithiasis. Bladder is
unremarkable. No evidence of bowel obstruction or focal bowel
thickening. Atherosclerotic disease of the abdominal aorta with
focal ectasia of the infrarenal abdominal aorta measuring up to
cm, unchanged compared to prior exam.

SKELETON: No focal hypermetabolic activity to suggest skeletal
metastasis.

Incidental CT findings: none

EXTREMITIES: Focal hypermetabolic near the left antecubital fossa
appears to be external to the patient is likely related to tracer
injection. FDG uptake about the right knee is unchanged compared to
prior and likely related to right knee arthroplasty. No abnormal
hypermetabolic activity in the lower extremities.

Incidental CT findings: Interval resolution of previously seen focal
skin thickening of the lower anterior abdominal wall.
IMPRESSION: Hypermetabolic near circumferential right pleural nodularity,
increased compared to prior exam, and compatible with worsened
pleural metastatic disease.

New and increasing hypermetabolic solid pulmonary nodules seen
throughout the right lung, compatible with worsening pulmonary
metastatic disease.

Right lower lobe bronchial wall thickening and new ground-glass
opacities of the right hemithorax, most severe in the right lower
lobe, concerning for lymphangitic carcinomatosis.

Left pleural nodule demonstrates FDG uptake below mediastinal blood
pool, but is increased in size compared to prior exam, finding is
likely due to left pleural metastatic disease.

Aortic Atherosclerosis ([83]-[83]).

## 2021-08-27 IMAGING — MR MR HEAD WO/W CM
15 series · 48 of 48 positions shown · IV contrast (gadavist)
Comparison: None.

CLINICAL DATA: Malignant, surveillance trauma headaches and
dizziness

EXAM:
MRI HEAD WITHOUT AND WITH CONTRAST
TECHNIQUE: Multiplanar, multiecho pulse sequences of the brain and surrounding
structures were obtained without and with intravenous contrast.
CONTRAST:  7mL GADAVIST GADOBUTROL 1 MMOL/ML IV SOLN

[Series 5: DWI · axial · 3.0mm · 1.25mm/px · z∈[-39,+102]mm · 5 of 96 slices shown (1 of 2)]
[im 1/96]
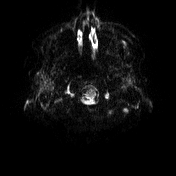
[im 24/96]
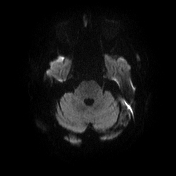
[im 48/96]
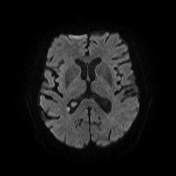
[im 72/96]
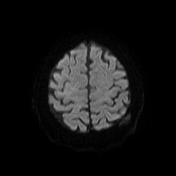
[im 96/96]
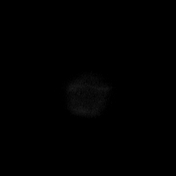

[Series 6: DWI · axial · 3.0mm · 1.25mm/px · z∈[-39,+102]mm · 3 of 48 slices shown (2 of 2)]
[im 1/48]
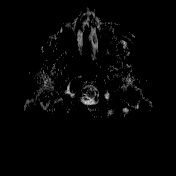
[im 24/48]
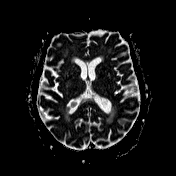
[im 48/48]
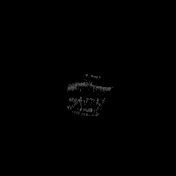

[Series 7: T1 · sagittal · 5.0mm · 0.69mm/px · 1 of 24 slices shown (1 of 4)]
[im 1/24]
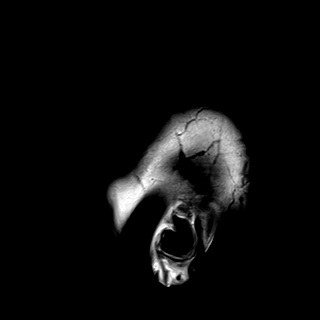

[Series 8: T2 · axial · 5.0mm · 0.57mm/px · 1 of 26 slices shown (1 of 2)]
[im 1/26]
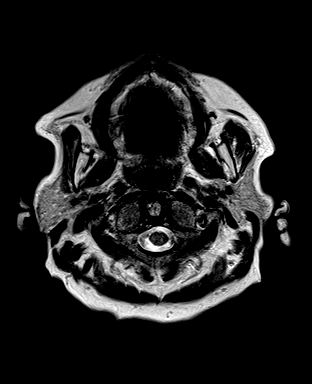

[Series 9: swi_images · axial · 3.0mm · 0.69mm/px · z∈[-57,+107]mm · 3 of 56 slices shown]
[im 1/56]
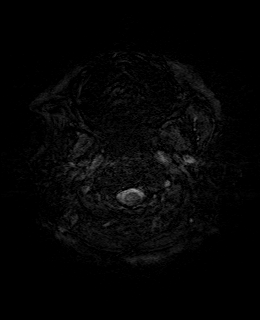
[im 28/56]
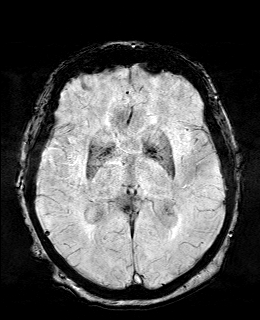
[im 56/56]
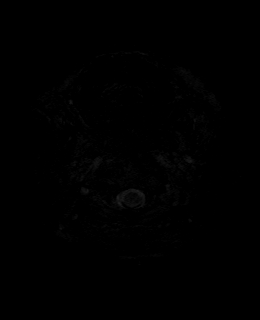

[Series 11: FLAIR · axial · 3.0mm · 0.69mm/px · z∈[-51,+101]mm · 3 of 52 slices shown]
[im 1/52]
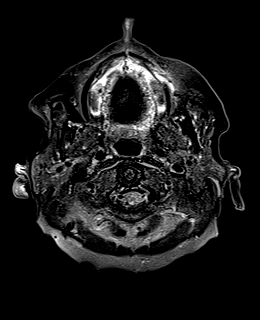
[im 26/52]
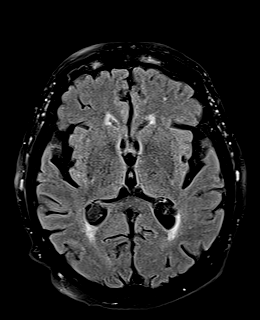
[im 52/52]
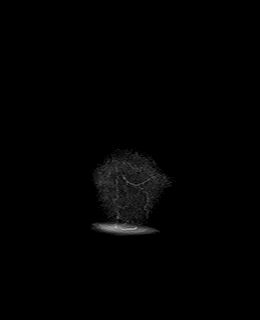

[Series 12: T1 · axial · 1.0mm · 0.86mm/px · z∈[-51,+108]mm · 9 of 160 slices shown (2 of 4)]
[im 1/160]
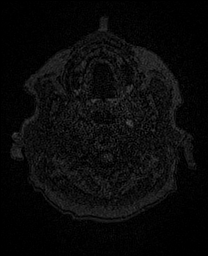
[im 20/160]
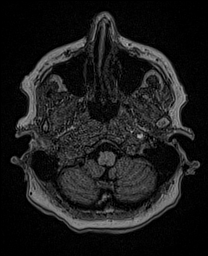
[im 40/160]
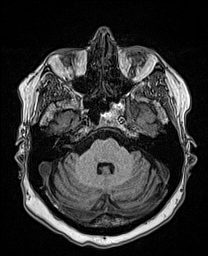
[im 60/160]
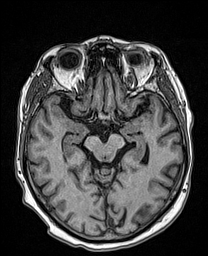
[im 80/160]
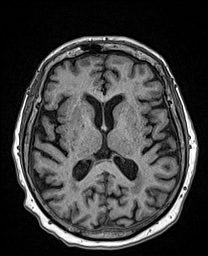
[im 100/160]
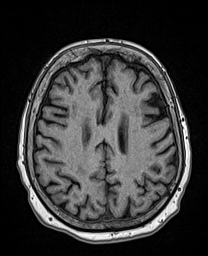
[im 120/160]
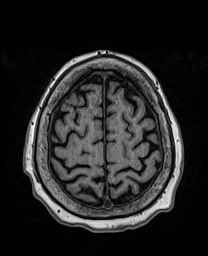
[im 140/160]
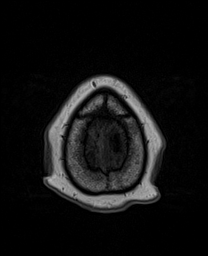
[im 160/160]
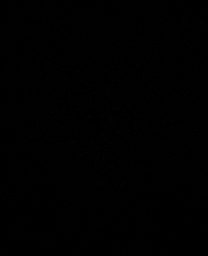

[Series 13: cor dwi_tracew · coronal · 5.0mm · 1.47mm/px · 3 of 56 slices shown]
[im 1/56]
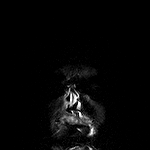
[im 28/56]
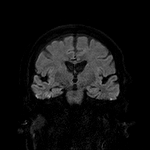
[im 56/56]
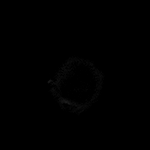

[Series 14: cor dwi_adc · coronal · 5.0mm · 1.47mm/px · 2 of 28 slices shown]
[im 1/28]
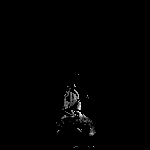
[im 28/28]
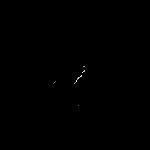

[Series 15: T2 · coronal · 5.0mm · 0.57mm/px · 2 of 28 slices shown (2 of 2)]
[im 1/28]
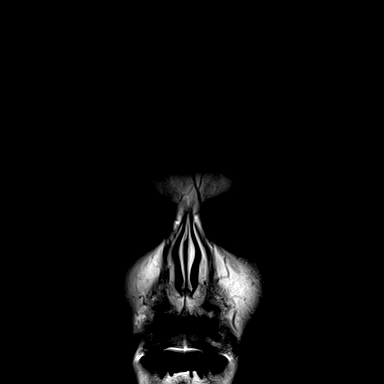
[im 28/28]
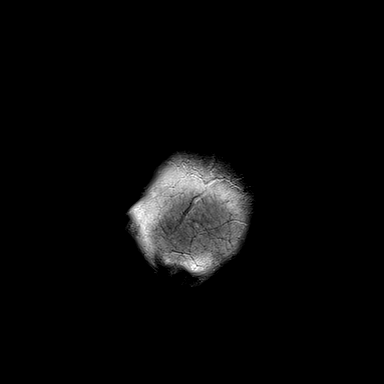

[Series 16: T1 post-contrast · axial · 1.0mm · 0.86mm/px · z∈[-51,+108]mm · 9 of 160 slices shown (1 of 3)]
[im 1/160]
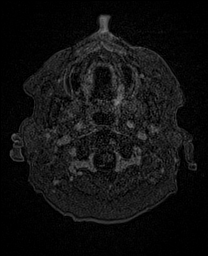
[im 20/160]
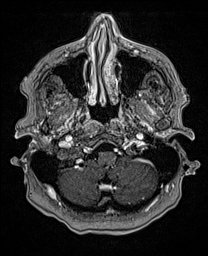
[im 40/160]
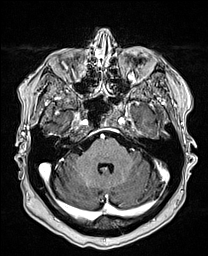
[im 60/160]
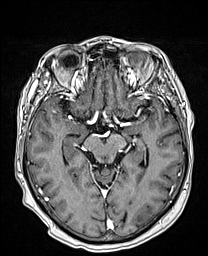
[im 80/160]
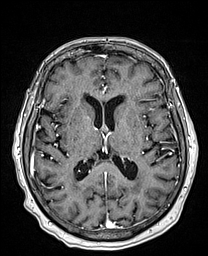
[im 100/160]
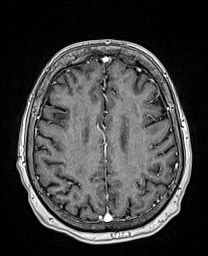
[im 120/160]
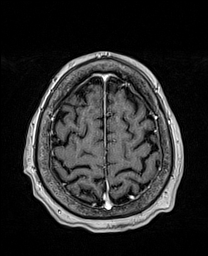
[im 140/160]
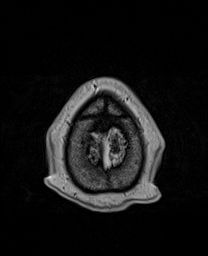
[im 160/160]
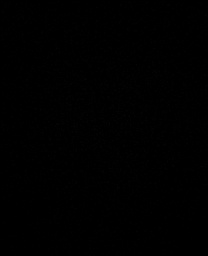

[Series 17: T1 · sagittal · 4.0mm · 0.86mm/px · 2 of 34 slices shown (3 of 4)]
[im 1/34]
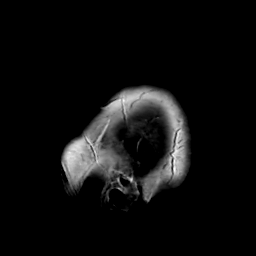
[im 34/34]
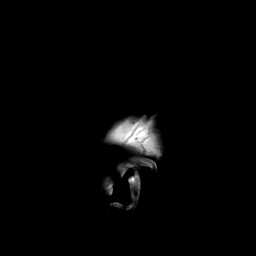

[Series 18: T1 · coronal · 4.0mm · 0.86mm/px · 2 of 40 slices shown (4 of 4)]
[im 1/40]
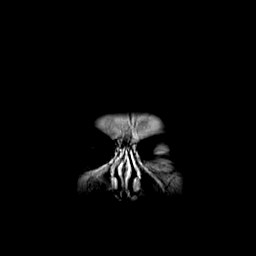
[im 40/40]
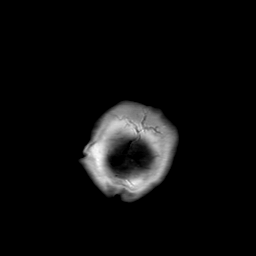

[Series 19: T1 post-contrast · coronal · 5.0mm · 0.43mm/px · 2 of 28 slices shown (2 of 3)]
[im 1/28]
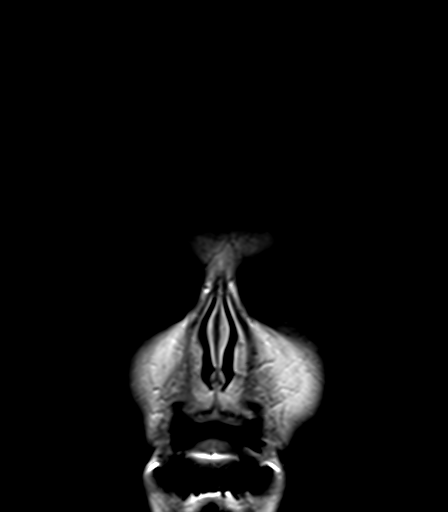
[im 28/28]
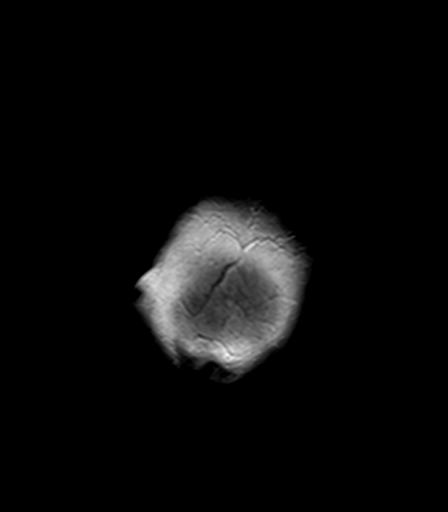

[Series 20: T1 post-contrast · sagittal · 5.0mm · 0.69mm/px · 1 of 24 slices shown (3 of 3)]
[im 1/24]
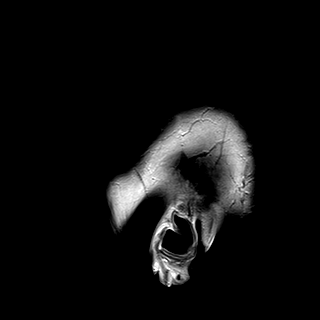

[48 of 48 positions shown; findings below may reference images not displayed]

FINDINGS: Brain: 5 x 6 x 3 mm enhancing lesion in the inferior right frontal
lobe (series 16, image 65 and series 20, image 8), with mild
associated T2 hyperintense signal, favored to be intra-axial.

No acute infarct, hemorrhage, mass effect, or midline shift. T2
hyperintense signal in the periventricular white matter, likely the
sequela of chronic small vessel ischemic disease. Right parietal
DVA.

Vascular: Normal flow voids.

Skull and upper cervical spine: No suspicious lesions. Likely venous
[place] in the left parietal bone.

Sinuses/Orbits: Negative.

Other: The mastoids are well aerated.
IMPRESSION: 6 mm enhancing lesion in the inferior right frontal lobe, with mild
associated edema, favored to be intra-axial. Given the patient's
history, this is concerning for metastatic disease.

## 2021-08-27 MED ORDER — FLUDEOXYGLUCOSE F - 18 (FDG) INJECTION
7.5000 | Freq: Once | INTRAVENOUS | Status: AC
  Administered 2021-08-27: 8.03 via INTRAVENOUS

## 2021-08-27 MED ORDER — GADOBUTROL 1 MMOL/ML IV SOLN
7.0000 mL | Freq: Once | INTRAVENOUS | Status: AC | PRN
  Administered 2021-08-27: 7 mL via INTRAVENOUS

## 2021-08-28 ENCOUNTER — Telehealth: Payer: Self-pay | Admitting: Adult Health

## 2021-08-28 NOTE — Telephone Encounter (Signed)
Called [place] but she did not answer. Left message for her to call back.

## 2021-08-29 ENCOUNTER — Encounter: Payer: Self-pay | Admitting: *Deleted

## 2021-08-29 NOTE — Progress Notes (Signed)
Oncology Nurse Navigator Documentation  Oncology Nurse Navigator Flowsheets 08/29/2021  Planned Course of Treatment -  Phase of Treatment -  Chemotherapy Pending- Reason: -  Chemotherapy Actual Start Date: -  Navigator Follow Up Date: 09/02/2021  Navigator Follow Up Reason: Follow-up Appointment  Navigator Location [facility]  Navigator Encounter Type Scan Review  Telephone -  Treatment Initiated Date -  Patient Visit Type MedOnc  Treatment Phase Active Tx  Barriers/Navigation Needs Coordination of Care;Education  Education -  Interventions None Required  Acuity Level 2-Minimal Needs (1-2 Barriers Identified)  Coordination of Care -  Education Method -  Support Groups/Services Friends and Family  Time Spent with Patient 15

## 2021-08-29 NOTE — Telephone Encounter (Signed)
Called and spoke with Peggy Howard. She stated that she was calling to get a verbal to continue the Pleurx draining or to D/C the catheter. She has been draining the catheter once a week and is only getting a few drops out. The site looks normal with no signs of infection. Patient is not in any distress.   I reviewed her OV note from 08/23/21 and advised her that the decision on the Pleurx catheter depended on what the PET showed. She had the PET on 08/27/21. The images are in her chart but the radiologist has not read the scan. She is aware that we will call her back once TP or BI has reviewed the scan.   While on the phone, she also wanted to know if she could get a verbal for her to increase her O2 to 3L. She was still using 2L and her O2 was struggling to get above 88%. I advised her that per the AVS, she can be on 3L of O2. She verbalized understanding.   Will route to BI and TP as a FYI about the pending PET scan results.

## 2021-08-29 NOTE — Telephone Encounter (Signed)
Peggy Howard is returning phone call. [place] phone number is [phone]. May leave message on voicemail.

## 2021-08-30 ENCOUNTER — Encounter: Payer: Self-pay | Admitting: *Deleted

## 2021-08-30 ENCOUNTER — Inpatient Hospital Stay: Payer: Medicare Other | Attending: Hematology & Oncology | Admitting: Hematology & Oncology

## 2021-08-30 ENCOUNTER — Encounter: Payer: Self-pay | Admitting: Hematology & Oncology

## 2021-08-30 VITALS — BP 135/58 | HR 86 | Temp 98.2°F | Resp 18 | Wt 151.0 lb

## 2021-08-30 DIAGNOSIS — J90 Pleural effusion, not elsewhere classified: Secondary | ICD-10-CM | POA: Insufficient documentation

## 2021-08-30 DIAGNOSIS — C4362 Malignant melanoma of left upper limb, including shoulder: Secondary | ICD-10-CM | POA: Diagnosis not present

## 2021-08-30 DIAGNOSIS — C439 Malignant melanoma of skin, unspecified: Secondary | ICD-10-CM

## 2021-08-30 DIAGNOSIS — C7931 Secondary malignant neoplasm of brain: Secondary | ICD-10-CM | POA: Diagnosis not present

## 2021-08-30 DIAGNOSIS — C7801 Secondary malignant neoplasm of right lung: Secondary | ICD-10-CM | POA: Insufficient documentation

## 2021-08-30 NOTE — Progress Notes (Signed)
Hematology and Oncology Follow Up Visit  Peggy Howard [id number] 1933-06-29 87 y.o. 08/30/2021   Principle Diagnosis:  Recurrent melanoma-right pleural effusion  -- BRAFwt/ c-Kit (+)  Current Therapy:   Keytruda 200 mg IV q 3 week -- s/p cycle #1 -- d/c on 08/14/2021 for progression Gleevec 400 mg po q day -- start on 08/19/2021     Interim History:  Peggy Howard is back for follow-up.  Unfortunately, it clearly looks like she is progressing.  We did go ahead and do a PET scan on her.  This was done on 08/27/2021.  She clearly has progressive disease over in the right lung.  She has more pleural involvement.  She has more pulmonary nodules.  Surprising, the left lung still looks okay.  There really is no disease elsewhere in the body on the PET scan.  We then did a MRI of the brain.  This was just a routine MRI.  Shockingly, this did show a 5 x 6 x 3 mm lesion in the inferior right frontal lobe.  It was felt to be consistent with a metastatic focus.  She has only been on [place] for about a week or so.  Clearly, this melanoma is quite resilient.  Hopefully, the [place] will work.  Has had no problems with headache.  There is no visual changes.  Her main problem has been shortness of breath.  She gets short of breath with exertion.  She has a Pleurx catheter in which is not draining.  I think pulmonary wants to pull this.  She does have inhalers but has not been using them because she was concerned about any interactions with the [place].  I told her that it is okay for her to use the inhalers.  She has had no problems with fever.  There is been a little bit of a cough.  There is no hemoptysis.  Her appetite has been okay.  There is been no problems with nausea or vomiting.  She has had no change in bowel or bladder habits.  Overall, I would have to say that her performance status right now is ECOG 1.   Medications:  Current Outpatient Medications:    acetaminophen (TYLENOL) 500  MG tablet, Take 1,000 mg by mouth every 6 (six) hours as needed for mild pain, fever or headache., Disp: , Rfl:    albuterol (VENTOLIN HFA) 108 (90 Base) MCG/ACT inhaler, Inhale 2 puffs into the lungs every 6 (six) hours as needed for wheezing or shortness of breath., Disp: 8 g, Rfl: 2   ferrous sulfate 325 (65 FE) MG tablet, Take 325 mg by mouth daily with breakfast., Disp: , Rfl:    fluticasone-salmeterol (ADVAIR DISKUS) 250-50 MCG/ACT AEPB, Inhale 1 puff into the lungs in the morning and at bedtime., Disp: 1 each, Rfl: 1   imatinib (GLEEVEC) 400 MG tablet, Take 1 tablet (400 mg total) by mouth daily. Take with meals and large glass of water.Caution:Chemotherapy., Disp: 30 tablet, Rfl: 5   Multiple Vitamins-Minerals (PRESERVISION AREDS 2 PO), Take 1 tablet by mouth 2 (two) times daily., Disp: , Rfl:    psyllium (METAMUCIL SMOOTH TEXTURE) 58.6 % powder, Take 1 packet by mouth in the morning and at bedtime., Disp: , Rfl:   Allergies:  Allergies  Allergen Reactions   Vitamin D Analogs Other (See Comments)    "dizziness"    Past Medical History, Surgical history, Social history, and Family History were reviewed and updated.  Review of Systems: Review of  Systems  Constitutional: Negative.   HENT:  Negative.    Eyes: Negative.   Respiratory:  Positive for shortness of breath.   Cardiovascular: Negative.   Gastrointestinal: Negative.   Endocrine: Negative.   Genitourinary: Negative.    Musculoskeletal: Negative.   Skin: Negative.   Neurological: Negative.   Psychiatric/Behavioral: Negative.     Physical Exam:  weight is 151 lb (68.5 kg). Her oral temperature is 98.2 F (36.8 C). Her blood pressure is 135/58 (abnormal) and her pulse is 86. Her respiration is 18 and oxygen saturation is 91%.   Wt Readings from Last 3 Encounters:  08/30/21 151 lb (68.5 kg)  08/23/21 151 lb 6.4 oz (68.7 kg)  08/14/21 147 lb 12.8 oz (67 kg)    Physical Exam Vitals reviewed.  HENT:     Head:  Normocephalic and atraumatic.  Eyes:     Pupils: Pupils are equal, round, and reactive to light.  Cardiovascular:     Rate and Rhythm: Normal rate and regular rhythm.     Heart sounds: Normal heart sounds.  Pulmonary:     Effort: Pulmonary effort is normal.     Breath sounds: Normal breath sounds.     Comments: Pulmonary exam shows decent breath sounds bilaterally.  She may have some congestion and wheezes bilaterally.  She may have a little bit decreased breath sounds at the right lung base. Abdominal:     General: Bowel sounds are normal.     Palpations: Abdomen is soft.  Musculoskeletal:        General: No tenderness or deformity. Normal range of motion.     Cervical back: Normal range of motion.     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  This is the ring finger.  She has a amputation down to the proximal interphalangeal joint.  Lymphadenopathy:     Cervical: No cervical adenopathy.  Skin:    General: Skin is warm and dry.     Findings: No erythema or rash.  Neurological:     Mental Status: She is alert and oriented to person, place, and time.  Psychiatric:        Behavior: Behavior normal.        Thought Content: Thought content normal.        Judgment: Judgment normal.     Lab Results  Component Value Date   WBC 6.8 08/14/2021   HGB 11.4 (L) 08/14/2021   HCT 36.0 08/14/2021   MCV 90.0 08/14/2021   PLT 354 08/14/2021     Chemistry      Component Value Date/Time   NA 138 08/14/2021 1302   NA 144 07/16/2017 1029   K 4.4 08/14/2021 1302   CL 101 08/14/2021 1302   CO2 27 08/14/2021 1302   BUN 30 (H) 08/14/2021 1302   BUN 14 07/16/2017 1029   CREATININE 0.77 08/14/2021 1302      Component Value Date/Time   CALCIUM 9.3 08/14/2021 1302   ALKPHOS 57 08/14/2021 1302   AST 32 08/14/2021 1302   ALT 10 08/14/2021 1302   BILITOT 0.5 08/14/2021 1302      Impression and Plan:  Peggy Howard is a very charming 87 year old  white female.  She has recurrent melanoma.  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  Hopefully, the [place] will start to work.  Given the fact that this melanoma has the c-kit mutation, I have to believe that  Gleevec should be able to help.  It may take a few weeks before we start to see any improvement in her pulmonary status.  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  It is small.  I will think radiosurgery should work well for this.  I will speak with Dr. Sondra Come of Radiation Oncology about getting her set up for an evaluation.  I just hate that she is progressing.  Ms. Timmons is so nice.  She is trying her best.  She has great support from her family.  I will like to get her back to see [name] now in about 3 weeks or so.  Hopefully, by then, we will start to see her clinically improving from the pulmonary metastasis.  Hopefully she would have had the radiosurgery for the CNS lesion.   Volanda Napoleon, MD 10/7/20223:49 PM

## 2021-08-30 NOTE — Progress Notes (Signed)
Dr Marin Olp would like patient to come in today for review of her PET and MRI.   Called and spoke to her son, [name].   Oncology Nurse Navigator Documentation  Oncology Nurse Navigator Flowsheets 08/30/2021  Planned Course of Treatment -  Phase of Treatment -  Chemotherapy Pending- Reason: -  Chemotherapy Actual Start Date: -  Navigator Follow Up Date: 08/30/2021  Navigator Follow Up Reason: Follow-up Appointment  Navigator Location [facility]  Navigator Encounter Type Telephone  Telephone Appt Confirmation/Clarification;Outgoing Call  Treatment Initiated Date -  Patient Visit Type MedOnc  Treatment Phase Active Tx  Barriers/Navigation Needs Coordination of Care;Education  Education -  Interventions Coordination of Care  Acuity Level 2-Minimal Needs (1-2 Barriers Identified)  Coordination of Care Appts  Education Method -  Support Groups/Services Friends and Family  Time Spent with Patient 30

## 2021-08-30 NOTE — Telephone Encounter (Signed)
Called to get patient scheduled for Pleurx removal they are not able to do it on 10/11 as Dr. Fabio Bering scheduled in completely full. They got the patient scheduled for Monday 09/02/21 at 2pm with Dr. Tamala Julian will route to both providers. Called and gave patient information. Nothing further needed at this time.         June Leap L, DO  [facility] Procedure; Darral Dash, RN 16 hours ago (5:15 PM)   Please schedule with me on 09/03/2021. Needs to be in the morning. I am leaving at noon that day.   Otherwise will have to get a colleague to pull it for me next week   If we cant do in the morning put with Dr. Tamala Julian or Dr. Erin Fulling on Monday afternoon   Thanks  BLI      Ennever, Rudell Cobb, MD  Icard, Bradley L, DO 17 hours ago (4:49 PM)   Yes.       Garner Nash, DO  Potts, Chipper Herb, CMA; [facility] Triage Pool; Volanda Napoleon, MD; Parrett, Fonnie Mu, NP 21 hours ago (1:08 PM)    Collier Salina,   can we pull pleur-ex?  Its not draining anymore.   I am ok with pulling it.   Thanks   BLI

## 2021-08-30 NOTE — Progress Notes (Signed)
Patient and family here to discuss recent scans which show progression. She has already switched treatment. She will also need a referral to RadOnc for her solitary brain met. Referral order placed.   Oncology Nurse Navigator Documentation  Oncology Nurse Navigator Flowsheets 08/30/2021  Planned Course of Treatment -  Phase of Treatment -  Chemotherapy Pending- Reason: -  Chemotherapy Actual Start Date: -  Navigator Follow Up Date: 09/06/2021  Navigator Follow Up Reason: Radiation  Navigator Location [facility]  Navigator Encounter Type Follow-up Appt  Telephone -  Treatment Initiated Date -  Patient Visit Type MedOnc  Treatment Phase Active Tx  Barriers/Navigation Needs Coordination of Care;Education  Education -  Interventions Referrals  Acuity Level 2-Minimal Needs (1-2 Barriers Identified)  Referrals Social Work  English as a second language teacher of Care -  Education Method -  Support Groups/Services Friends and Family  Time Spent with Patient 30

## 2021-09-02 ENCOUNTER — Encounter (HOSPITAL_COMMUNITY)
Admission: RE | Disposition: A | Discharge status: Home / Self Care | Payer: Self-pay | Source: Home / Self Care | Attending: Pulmonary Disease

## 2021-09-02 ENCOUNTER — Inpatient Hospital Stay: Payer: Medicare Other

## 2021-09-02 ENCOUNTER — Other Ambulatory Visit: Payer: Self-pay | Admitting: Radiation Therapy

## 2021-09-02 ENCOUNTER — Inpatient Hospital Stay: Payer: Medicare Other | Admitting: Hematology & Oncology

## 2021-09-02 ENCOUNTER — Ambulatory Visit (HOSPITAL_COMMUNITY)
Admission: RE | Admit: 2021-09-02 | Discharge: 2021-09-02 | Disposition: A | Discharge status: Home / Self Care | Payer: Medicare Other | Attending: Pulmonary Disease | Admitting: Pulmonary Disease

## 2021-09-02 ENCOUNTER — Encounter (HOSPITAL_COMMUNITY): Payer: Self-pay | Admitting: Pulmonary Disease

## 2021-09-02 DIAGNOSIS — C7800 Secondary malignant neoplasm of unspecified lung: Secondary | ICD-10-CM | POA: Insufficient documentation

## 2021-09-02 DIAGNOSIS — Z9981 Dependence on supplemental oxygen: Secondary | ICD-10-CM | POA: Diagnosis not present

## 2021-09-02 DIAGNOSIS — J90 Pleural effusion, not elsewhere classified: Secondary | ICD-10-CM

## 2021-09-02 DIAGNOSIS — Z79899 Other long term (current) drug therapy: Secondary | ICD-10-CM | POA: Insufficient documentation

## 2021-09-02 DIAGNOSIS — Z87891 Personal history of nicotine dependence: Secondary | ICD-10-CM | POA: Diagnosis not present

## 2021-09-02 DIAGNOSIS — Z7951 Long term (current) use of inhaled steroids: Secondary | ICD-10-CM | POA: Diagnosis not present

## 2021-09-02 DIAGNOSIS — Z888 Allergy status to other drugs, medicaments and biological substances status: Secondary | ICD-10-CM | POA: Insufficient documentation

## 2021-09-02 DIAGNOSIS — C7931 Secondary malignant neoplasm of brain: Secondary | ICD-10-CM

## 2021-09-02 DIAGNOSIS — J9611 Chronic respiratory failure with hypoxia: Secondary | ICD-10-CM | POA: Diagnosis not present

## 2021-09-02 DIAGNOSIS — Z4682 Encounter for fitting and adjustment of non-vascular catheter: Secondary | ICD-10-CM | POA: Diagnosis present

## 2021-09-02 DIAGNOSIS — C439 Malignant melanoma of skin, unspecified: Secondary | ICD-10-CM | POA: Diagnosis not present

## 2021-09-02 DIAGNOSIS — J910 Malignant pleural effusion: Secondary | ICD-10-CM | POA: Diagnosis not present

## 2021-09-02 HISTORY — PX: REMOVAL OF PLEURAL DRAINAGE CATHETER: SHX5080

## 2021-09-02 SURGERY — REMOVAL, CLOSED DRAINAGE CATHETER SYSTEM, PLEURAL

## 2021-09-02 MED ORDER — LIDOCAINE HCL (PF) 1 % IJ SOLN
INTRAMUSCULAR | Status: AC
  Filled 2021-09-02: qty 30

## 2021-09-02 NOTE — Procedures (Signed)
Pleur-x removal   Time out was observed  Pt prepped w/ chlorahexidine, then localized w/ 1% lidocaine. Following this I bluntly dissected thru the stoma releasing the antimicrobial cuff. Then the chest tube was removed w/out difficulty. Occlusive dressing applied. PT tolerated well.   Erick Colace ACNP-BC [place] Pager # [phone] OR # [phone] if no answer

## 2021-09-02 NOTE — Interval H&P Note (Signed)
History and Physical Interval Note:  09/02/2021 2:50 PM  Peggy Howard  has presented today for surgery, with the diagnosis of resolved effusion.  The various methods of treatment have been discussed with the patient and family. After consideration of risks, benefits and other options for treatment, the patient has consented to  Procedure(s) with comments: INSERTION PLEURAL DRAINAGE CATHETER (N/A) - removal of drain as a surgical intervention.  The patient's history has been reviewed, patient examined, no change in status, stable for surgery.  I have reviewed the patient's chart and labs.  Questions were answered to the patient's satisfaction.     Freddi Starr

## 2021-09-02 NOTE — Interval H&P Note (Signed)
History and Physical Interval Note:  09/02/2021 2:49 PM  Peggy Howard  has presented today for surgery, with the diagnosis of resolved effusion.  The various methods of treatment have been discussed with the patient and family. After consideration of risks, benefits and other options for treatment, the patient has consented to  Procedure(s) with comments: INSERTION PLEURAL DRAINAGE CATHETER (N/A) - removal of drain as a surgical intervention.  The patient's history has been reviewed, patient examined, no change in status, stable for surgery.  I have reviewed the patient's chart and labs.  Questions were answered to the patient's satisfaction.     Freddi Starr

## 2021-09-03 ENCOUNTER — Other Ambulatory Visit (HOSPITAL_COMMUNITY): Payer: Self-pay

## 2021-09-03 ENCOUNTER — Encounter (HOSPITAL_COMMUNITY): Payer: Self-pay | Admitting: Pulmonary Disease

## 2021-09-04 ENCOUNTER — Other Ambulatory Visit: Payer: Medicare Other

## 2021-09-05 NOTE — Progress Notes (Signed)
Radiation Oncology         [phone] ________________________________  Initial Outpatient Consultation  Name: Peggy Howard MRN: [id number]  Date: 09/06/2021  DOB: 03-17-1933  CC:Pcp, No  Marin Olp Rudell Cobb, MD   REFERRING PHYSICIAN: Volanda Napoleon, MD  DIAGNOSIS:    ICD-10-CM   1. Brain metastasis ([place])  C79.31      Cancer Staging Melanoma metastatic to lung [facility]) Staging form: Melanoma of the Skin, AJCC 8th Edition - Clinical stage from 06/21/2021: Stage IV (cTX, cNX, cM1b(1)) - Signed by Volanda Napoleon, MD on 06/21/2021 Stage prefix: Initial diagnosis  Metastatic melanoma to lung, now with new brain mets (STAGE IV)  CHIEF COMPLAINT: Here to discuss management of metastatic melanoma to the lung with recent brain mets  HISTORY OF PRESENT ILLNESS::Peggy Howard is a 87 y.o. female who admitted to the hospital on 06/12/21 with acute SOB following rectopexy procedure, and was incidentally found to have several right-sided pulmonary nodules concerning for metastatic disease on chest CTA on 06/14/21. Chest x-ray taken showed some consolidation and a large pleural effusion in the right lower lung. The patient was accordingly admitted and underwent work-up consisting of a CT scan of the head, abdomen and pelvis on 06/15/21 which showed identical findings to CT on 07/22 and no evidence of intracranial metastasis, and right pleural fluid thoracentesis on 06/14/21 which showed malignant cells present. Thoracentesis seemed to resolve her symptoms (1.4 L of bloody fluid was drained) ,she was discharged home on the 25th and instructed to follow-up with oncology.   Accordingly, the patient met with Dr. Marin Olp on 06/21/21. (Dr. Marin Olp had seen the patient while she was admitted, and had seen her in 2021 for melanoma on her left hand/ 4th digit, which was amputated). Per Dr. Dicie Beam visit note, he was surprised that the melanoma recurred this way, and ordered a PET to determine  the extent of disease, as well as an additional chest x-ray. Chest x-ray performed during this visit showed right middle lobe consolidation and some pleural fluid at the right lateral base.  Unfortunately, the patient presented to the ED on 06/27/21 with worsening SOB and was again admitted. Chest x-ray showed worsening/ recurrent right pleural effusion with persistent right basilar airspace disease.  PCCM was then consulted and she had a Pleurx cath placed on 08/04.  About 1 L of blood-tinged pleural fluid was drained right away. Other treatment consisted of IV ceftriaxone, azithromycin and prednisone. Patient was noted to desat with ambulation and required 2 L supplemental O2 to maintain O2 saturation above 88%. The patient was discharged on 2 L on 06/29/21 and with pleurx cath, and arranged with outpatient follow-up. (Discharge Dx was hypoxemic respiratory failure likely due to malignant pleural effusion from malignant melanoma).  PET on 07/05/21 demonstrated extensive hypermetabolic pleural nodularity in the right hemithorax with some hypermetabolic mediastinal lymphadenopathy. No evidence for hypermetabolic left-sided pleural disease, or hypermetabolic activity in the neck, abdomen or pelvis was otherwise seen.Though, some small bilateral foci of skin thickening in the lower anterior abdominal wall with low level FDG accumulation was seen; noted to possibly represent a site of disease or related to infection/ inflammation.   The patient accordingly followed up with Dr. Marin Olp on 07/08/21 where she was recommended to start treatment asap consisting of Beryle Flock (first treatment on 07/17/21). Some mild swelling in her legs were noted during this visit, as well as continued SOB.  The patient then met with Dr. Valeta Harms on 07/12/21 for check up  of her pleurx catheter. Pleurx cath noted to drain around 20 cc of fluid every few days. The patient was instructed to keep her cath in place until oncology feels like  her malignancy treatment has stabilized.   During follow-up with Dr. Marin Olp on 08/14/21, the patient reported continued SOB and lack of fluid drainage from her pleurx cath. Accordingly, Dr. Marin Olp ordered a CXR which demonstrated fairly rapid progression with significant pleural-based nodules, indicative of true progression of her melanoma per Dr. Marin Olp. Nat Math was discontinued at this time due to progression, and the patient was prescribed po Gleevec 400 mg (starting 09/26).  Patient also reported dizziness at this time prompting Dr. Marin Olp to order a brain MRI in addition to restaging PET.  MRI of the brain on 08/27/21 demonstrated a 6 mm enhancing lesion in the inferior right frontal lobe, with mild associated edema (favored to be intra-axial). Findings were overall concerning for metastatic disease.  Restaging PET on 08/27/21 demonstrated progression in size of the hypermetabolic right pleural nodularity compared to prior PET; compatible with worsening pleural metastatic disease. New and increasing hypermetabolic solid pulmonary nodules were also seen throughout the right lung, noted as also compatible with worsening mets. 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  During her most recent follow-up with Dr. Marin Olp on 08/30/21, the patient reported continued lack of drainage from pleurx cath and continued SOB. Referral to radiation oncology was placed during this visit for consideration of radiosurgery for CNS mets.  Pleurx catheter removed on 09/02/21 by Dr. Erin Fulling.  I have personally looked at the patient's PET scan and MRI.  A 3 Tesla MRI is pending for today. Current/Past Therapy:        Keytruda 200  mg IV q 3 week -- s/p cycle #1 -- d/c on 08/14/2021 for progression Gleevec 400 mg po q day -- start on 08/19/2021  PREVIOUS RADIATION THERAPY: No  PAST MEDICAL HISTORY:  has a past medical history of Arthritis, Goals of care, counseling/discussion (06/21/2021), Malignant pleural effusion (06/21/2021), Melanoma metastatic to brain [facility]), Melanoma metastatic to lung ([place]) (06/21/2021), and Rectal prolapse.    PAST SURGICAL HISTORY: Past Surgical History:  Procedure Laterality Date   ABDOMINAL HYSTERECTOMY     amputation of left finger      2021   BIOPSY  03/27/2021   Procedure: BIOPSY;  Surgeon: Irving Copas., MD;  Location: [facility];  Service: Gastroenterology;;   COLONOSCOPY WITH PROPOFOL N/A 03/27/2021   Procedure: COLONOSCOPY WITH PROPOFOL;  Surgeon: Irving Copas., MD;  Location: [facility];  Service: Gastroenterology;  Laterality: N/A;   JOINT REPLACEMENT     POLYPECTOMY  03/27/2021   Procedure: POLYPECTOMY;  Surgeon: Mansouraty, Telford Nab., MD;  Location: [facility];  Service: Gastroenterology;;   REMOVAL OF PLEURAL DRAINAGE CATHETER  09/02/2021   Procedure: REMOVAL OF PLEURAL DRAINAGE CATHETER;  Surgeon: Freddi Starr, MD;  Location: [facility];  Service: Pulmonary;;   REPLACEMENT TOTAL KNEE Bilateral    XI ROBOT ASSISTED RECTOPEXY N/A 06/12/2021   Procedure: XI ROBOT ASSISTED RECTOPEXY;  Surgeon: Leighton Ruff, MD;  Location: [facility];  Service: General;  Laterality: N/A;    FAMILY HISTORY: family history is not on file.  SOCIAL HISTORY:  reports that she quit smoking about  45 years ago. Her smoking use included cigarettes. She has a 50.00 pack-year smoking history. She has never used smokeless tobacco. She reports current drug use. Drug: Benzodiazepines. She reports that she does not drink alcohol.  ALLERGIES: Vitamin d analogs  MEDICATIONS:  Current Outpatient Medications  Medication Sig Dispense Refill   acetaminophen (TYLENOL) 500 MG  tablet Take 1,000 mg by mouth every 6 (six) hours as needed for mild pain, fever or headache.     albuterol (VENTOLIN HFA) 108 (90 Base) MCG/ACT inhaler Inhale 2 puffs into the lungs every 6 (six) hours as needed for wheezing or shortness of breath. 8 g 2   fluticasone-salmeterol (ADVAIR DISKUS) 250-50 MCG/ACT AEPB Inhale 1 puff into the lungs in the morning and at bedtime. 1 each 1   imatinib (GLEEVEC) 400 MG tablet Take 1 tablet (400 mg total) by mouth daily. Take with meals and large glass of water.Caution:Chemotherapy. 30 tablet 5   Multiple Vitamins-Minerals (PRESERVISION AREDS 2 PO) Take 1 tablet by mouth 2 (two) times daily.     psyllium (METAMUCIL SMOOTH TEXTURE) 58.6 % powder Take 1 packet by mouth in the morning and at bedtime.     ferrous sulfate 325 (65 FE) MG tablet Take 325 mg by mouth daily with breakfast. (Patient not taking: Reported on 09/06/2021)     No current facility-administered medications for this encounter.    REVIEW OF SYSTEMS:  Notable for that above.   PHYSICAL EXAM:  height is $RemoveB'5\' 4"'FmljBaBt$  (1.626 m) and weight is 144 lb 3.2 oz (65.4 kg). Her skin temperature is 97.3 F (36.3 C) (abnormal). Her blood pressure is 155/67 (abnormal) and her pulse is 79. Her respiration is 20 and oxygen saturation is 91%.   General: Alert and oriented, in no acute distress -breathing oxygen through nasal cannula HEENT: Head is normocephalic. Extraocular movements are intact. Oropharynx is clear. Heart: Regular in rate and rhythm with no murmurs, rubs, or gallops. Chest: Clear to auscultation bilaterally, with no rhonchi, wheezes, or rales. Ext: She is missing a portion of the fourth left digit. Musculoskeletal: symmetric strength and muscle tone throughout. Neurologic: Resting tremor of head. Cranial nerves II through XII are grossly intact. No obvious focalities. Speech is fluent. Coordination is intact. Psychiatric: Judgment and insight are intact. Affect is appropriate.  KPS = 70  100 -  Normal; no complaints; no evidence of disease. 90   - Able to carry on normal activity; minor signs or symptoms of disease. 80   - Normal activity with effort; some signs or symptoms of disease. 27   - Cares for self; unable to carry on normal activity or to do active work. 60   - Requires occasional assistance, but is able to care for most of his personal needs. 50   - Requires considerable assistance and frequent medical care. 60   - Disabled; requires special care and assistance. 51   - Severely disabled; hospital admission is indicated although death not imminent. 66   - Very sick; hospital admission necessary; active supportive treatment necessary. 10   - Moribund; fatal processes progressing rapidly. 0     - Dead  Karnofsky DA, Abelmann [place], Craver LS and Burchenal [facility] [phone]) The use of the nitrogen mustards in the palliative treatment of carcinoma: with particular reference to bronchogenic carcinoma Cancer 1 [phone]  LABORATORY DATA:  Lab Results  Component Value Date   WBC 6.8 08/14/2021   HGB 11.4 (L) 08/14/2021   HCT 36.0 08/14/2021   MCV 90.0  08/14/2021   PLT 354 08/14/2021   CMP     Component Value Date/Time   NA 138 08/14/2021 1302   NA 144 07/16/2017 1029   K 4.4 08/14/2021 1302   CL 101 08/14/2021 1302   CO2 27 08/14/2021 1302   GLUCOSE 126 (H) 08/14/2021 1302   BUN 30 (H) 08/14/2021 1302   BUN 14 07/16/2017 1029   CREATININE 0.77 08/14/2021 1302   CALCIUM 9.3 08/14/2021 1302   PROT 6.7 08/14/2021 1302   PROT 6.7 07/16/2017 1029   ALBUMIN 3.7 08/14/2021 1302   ALBUMIN 4.4 07/16/2017 1029   AST 32 08/14/2021 1302   ALT 10 08/14/2021 1302   ALKPHOS 57 08/14/2021 1302   BILITOT 0.5 08/14/2021 1302   GFRNONAA >60 08/14/2021 1302   GFRAA 81 07/16/2017 1029         RADIOGRAPHY: DG Chest 2 View  Result Date: 08/14/2021 CLINICAL DATA:  Pleural effusion.  History of metastatic melanoma. EXAM: CHEST - 2 VIEW COMPARISON:  June 27, 2021. FINDINGS: Stable  cardiomegaly. No pneumothorax is noted. Multiple pleural-based nodular densities are noted throughout the right hemithorax consistent with pleural metastatic disease. Probable left pulmonary nodules are noted also concerning for metastatic disease. Right-sided pleural drainage catheter is noted. Small right pleural effusion may be present. Bony thorax is unremarkable. IMPRESSION: Stable position of right-sided pleural drainage catheter with small right pleural effusion present, which is significantly smaller compared to prior exam. However, there is interval development of multiple pleural-based nodular densities throughout the right hemithorax consistent with pleural metastatic disease. Electronically Signed   By: Marijo Conception M.D.   On: 08/14/2021 14:30   MR Brain W Wo Contrast  Result Date: 08/28/2021 CLINICAL DATA:  Malignant, surveillance trauma headaches and dizziness EXAM: MRI HEAD WITHOUT AND WITH CONTRAST TECHNIQUE: Multiplanar, multiecho pulse sequences of the brain and surrounding structures were obtained without and with intravenous contrast. CONTRAST:  64mL GADAVIST GADOBUTROL 1 MMOL/ML IV SOLN COMPARISON:  None. FINDINGS: Brain: 5 x 6 x 3 mm enhancing lesion in the inferior right frontal lobe (series 16, image 65 and series 20, image 8), with mild associated T2 hyperintense signal, favored to be intra-axial. No acute infarct, hemorrhage, mass effect, or midline shift. T2 hyperintense signal in the periventricular white matter, likely the sequela of chronic small vessel ischemic disease. Right parietal DVA. Vascular: Normal flow voids. Skull and upper cervical spine: No suspicious lesions. Likely venous [place] in the left parietal bone. Sinuses/Orbits: Negative. Other: The mastoids are well aerated. IMPRESSION: 6 mm enhancing lesion in the inferior right frontal lobe, with mild associated edema, favored to be intra-axial. Given the patient's history, this is concerning for metastatic disease.  Electronically Signed   By: Merilyn Baba M.D.   On: 08/28/2021 19:07   NM PET Image Restage (PS) Whole Body  Result Date: 08/29/2021 CLINICAL DATA:  Follow-up treatment strategy for melanoma. EXAM: NUCLEAR MEDICINE PET WHOLE BODY TECHNIQUE: 8.0 mCi F-18 FDG was injected intravenously. Full-ring PET imaging was performed from the head to foot after the radiotracer. CT data was obtained and used for attenuation correction and anatomic localization. Fasting blood glucose: 133 mg/dl COMPARISON:  PET-CT dated July 05, 2021. FINDINGS: Mediastinal blood pool activity: SUV max 2.3 HEAD/NECK: No hypermetabolic activity in the scalp. No hypermetabolic cervical lymph nodes. Incidental CT findings: none CHEST: Near circumferential right pleural nodularity, increased compared to prior exam, particularly in the upper lung. Reference new hypermetabolic right apical pleural nodule located adjacent to the mediastinum measuring  3.2 x 2.3 cm on series 4, image 77 with an SUV max of 6.4. Hypermetabolic activity of the right chest wall near the entry site of the chest tube, likely reactive. Similar hypermetabolic mediastinal adenopathy. Solid pulmonary nodules are seen throughout the right lung which are new and increased in size compared to prior exam. Reference new solid nodule of the right middle lobe measuring 9 mm on series 8, image 36 with an SUV max of 2.6. Increased size of left lower lobe pleural nodule measuring 1.6 x 1.0 cm with an SUV max of 1.6, located on series 8, image 41, previously measured 5 mm. Small loculated right pleural effusion with chest tube in place. Incidental CT findings: Right lower lobe bronchial wall thickening and new ground-glass opacities of the right hemithorax, most severe in the right lower lobe. Aortic atherosclerotic disease and coronary artery calcifications of the LAD and circumflex. No pericardial effusion. ABDOMEN/PELVIS: No abnormal hypermetabolic activity within the liver, pancreas,  adrenal glands, or spleen. No hypermetabolic lymph nodes in the abdomen or pelvis. Incidental CT findings: Cholelithiasis with no gallbladder wall thickening. Pancreas, spleen, and bilateral adrenal glands are unremarkable. No hydronephrosis or nephrolithiasis. Bladder is unremarkable. No evidence of bowel obstruction or focal bowel thickening. Atherosclerotic disease of the abdominal aorta with focal ectasia of the infrarenal abdominal aorta measuring up to 2.6 cm, unchanged compared to prior exam. SKELETON: No focal hypermetabolic activity to suggest skeletal metastasis. Incidental CT findings: none EXTREMITIES: Focal hypermetabolic near the left antecubital fossa appears to be external to the patient is likely related to tracer injection. FDG uptake about the right knee is unchanged compared to prior and likely related to right knee arthroplasty. No abnormal hypermetabolic activity in the lower extremities. Incidental CT findings: Interval resolution of previously seen focal skin thickening of the lower anterior abdominal wall. IMPRESSION: Hypermetabolic near circumferential right pleural nodularity, increased compared to prior exam, and compatible with worsened pleural metastatic disease. New and increasing hypermetabolic solid pulmonary nodules seen throughout the right lung, compatible with worsening pulmonary metastatic disease. 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 Left pleural nodule demonstrates FDG uptake below mediastinal blood pool, but is increased in size compared to prior exam, finding is likely due to left pleural metastatic disease. Aortic Atherosclerosis (ICD10-I70.0). Electronically Signed   By: Yetta Glassman M.D.   On: 08/29/2021 12:22      IMPRESSION/PLAN:  Today, I talked to the patient about the findings and work-up thus far.  We discussed the  patient's diagnosis of melanoma with metastatic disease, including at least 1 brain metastasis, and general treatment for this, highlighting the role of radiotherapy (stereotactic radiosurgery to the brain ) in the management.  We discussed the available radiation techniques, and focused on the details of logistics and delivery.     We discussed the risks, benefits, and side effects of radiotherapy. Side effects may include but not necessarily be limited to: Fatigue, headache, neurologic decline, injury and/or radionecrosis of the brain, possible need for surgical removal of brain tumor in the future;  no guarantees of treatment were given. A consent form was signed and placed in the patient's medical record.  The patient was encouraged to ask questions that I answered to the best of my ability.  She is enthusiastic to proceed.  She has an MRI pending for treatment planning.  This will allow [name] to determine if there are  other lesions warranting treatment simultaneously.  Patient will be scheduled in the near future for CT simulation and treatment planning.  We will coordinate care with neurosurgery.  The goal is to avoid craniotomy.  Patient and her son are pleased with this plan.   On date of service, in total, I spent 60 minutes on this encounter. Patient was seen in person.   __________________________________________   [name], MD  This document serves as a record of services personally performed by [name], MD. It was created on her behalf by Roney Mans, a trained medical scribe. The creation of this record is based on the scribe's personal observations and the provider's statements to them. This document has been checked and approved by the attending provider.

## 2021-09-06 ENCOUNTER — Ambulatory Visit
Admission: RE | Admit: 2021-09-06 | Discharge: 2021-09-06 | Disposition: A | Discharge status: Home / Self Care | Payer: Medicare Other | Source: Ambulatory Visit | Attending: Radiation Oncology | Admitting: Radiation Oncology

## 2021-09-06 ENCOUNTER — Other Ambulatory Visit: Payer: Self-pay

## 2021-09-06 ENCOUNTER — Encounter: Payer: Self-pay | Admitting: *Deleted

## 2021-09-06 ENCOUNTER — Encounter: Payer: Self-pay | Admitting: Radiation Oncology

## 2021-09-06 DIAGNOSIS — C7931 Secondary malignant neoplasm of brain: Secondary | ICD-10-CM | POA: Insufficient documentation

## 2021-09-06 DIAGNOSIS — Z87891 Personal history of nicotine dependence: Secondary | ICD-10-CM | POA: Diagnosis not present

## 2021-09-06 DIAGNOSIS — I517 Cardiomegaly: Secondary | ICD-10-CM | POA: Diagnosis not present

## 2021-09-06 DIAGNOSIS — R42 Dizziness and giddiness: Secondary | ICD-10-CM | POA: Diagnosis not present

## 2021-09-06 DIAGNOSIS — J90 Pleural effusion, not elsewhere classified: Secondary | ICD-10-CM | POA: Diagnosis not present

## 2021-09-06 DIAGNOSIS — M7989 Other specified soft tissue disorders: Secondary | ICD-10-CM | POA: Insufficient documentation

## 2021-09-06 DIAGNOSIS — C3431 Malignant neoplasm of lower lobe, right bronchus or lung: Secondary | ICD-10-CM | POA: Diagnosis not present

## 2021-09-06 DIAGNOSIS — I700 Atherosclerosis of aorta: Secondary | ICD-10-CM | POA: Diagnosis not present

## 2021-09-06 HISTORY — DX: Secondary malignant neoplasm of brain: C79.31

## 2021-09-06 IMAGING — MR MR HEAD WO/W CM
12 series · 48 of 48 positions shown · IV contrast (multihance)
Comparison: [DATE]

CLINICAL DATA: Melanoma, metastatic disease follow-up

EXAM:
MRI HEAD WITHOUT AND WITH CONTRAST
TECHNIQUE: Multiplanar, multiecho pulse sequences of the brain and surrounding
structures were obtained without and with intravenous contrast.
CONTRAST:  14mL MULTIHANCE GADOBENATE DIMEGLUMINE 529 MG/ML IV SOLN

[Series 2: FLAIR · sagittal · 3.0mm · 0.75mm/px · 3 of 39 slices shown (1 of 2)]
[im 1/39]
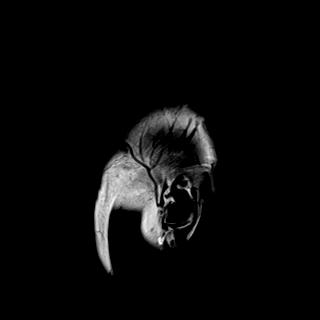
[im 20/39]
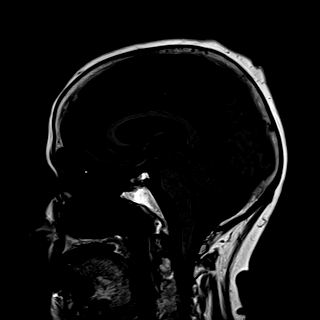
[im 39/39]
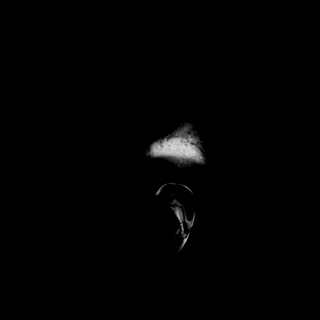

[Series 3: DWI · axial · 3.0mm · 1.50mm/px · z∈[-62,+86]mm · 5 of 78 slices shown (1 of 2)]
[im 1/78]
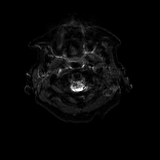
[im 20/78]
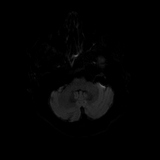
[im 39/78]
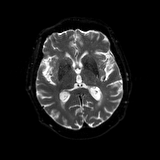
[im 58/78]
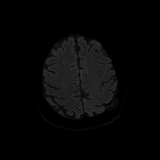
[im 78/78]
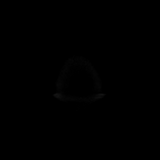

[Series 4: DWI · axial · 3.0mm · 1.50mm/px · z∈[-62,+86]mm · 2 of 39 slices shown (2 of 2)]
[im 1/39]
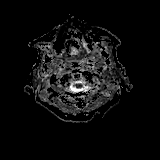
[im 39/39]
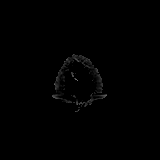

[Series 5: T2 · axial · 5.0mm · 0.57mm/px · 1 of 27 slices shown (1 of 2)]
[im 1/27]
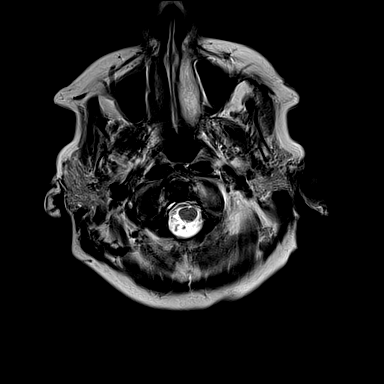

[Series 6: swi_images · axial · 1.5mm · 0.90mm/px · z∈[-57,+98]mm · 5 of 104 slices shown]
[im 1/104]
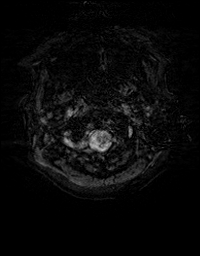
[im 26/104]
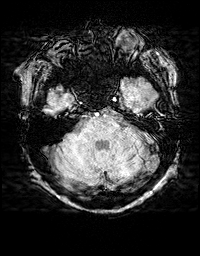
[im 52/104]
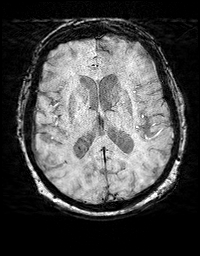
[im 78/104]
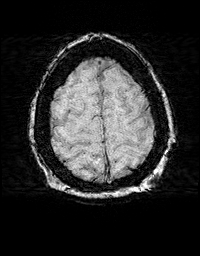
[im 104/104]
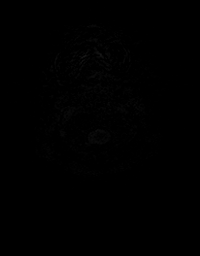

[Series 8: FLAIR · axial · 3.0mm · 0.86mm/px · z∈[-84,+108]mm · 3 of 61 slices shown (2 of 2)]
[im 1/61]
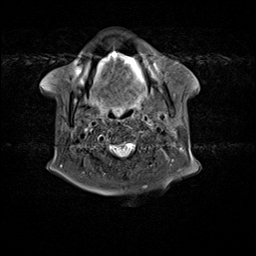
[im 31/61]
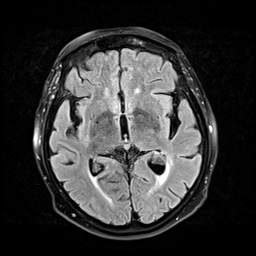
[im 61/61]
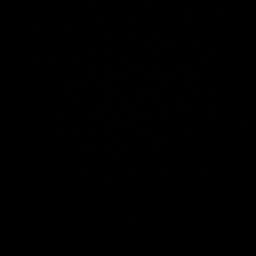

[Series 9: T2 · axial · non-contrast · 1.0mm · 0.86mm/px · z∈[-60,+96]mm · 8 of 160 slices shown (2 of 2)]
[im 1/160]
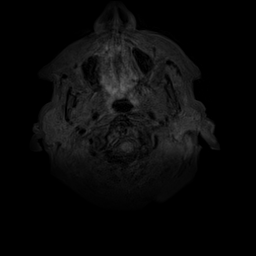
[im 23/160]
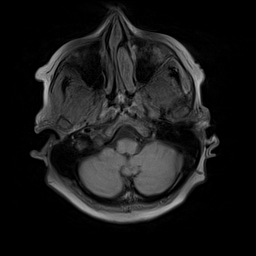
[im 46/160]
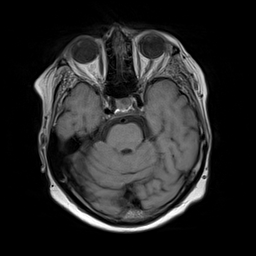
[im 69/160]
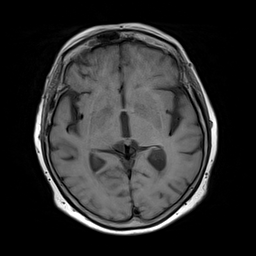
[im 91/160]
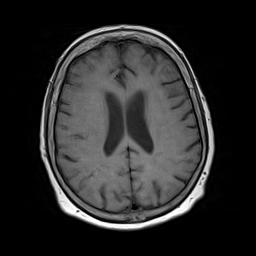
[im 114/160]
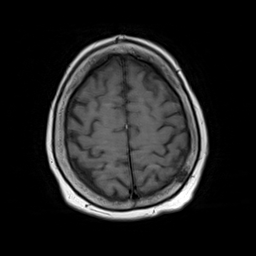
[im 137/160]
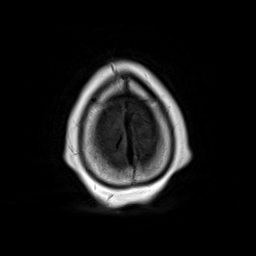
[im 160/160]
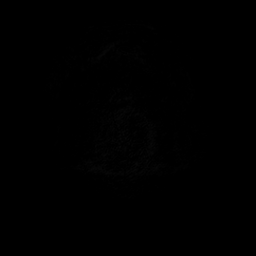

[Series 10: T2 post-contrast · coronal · 3.0mm · 0.57mm/px · 2 of 47 slices shown (1 of 2)]
[im 1/47]
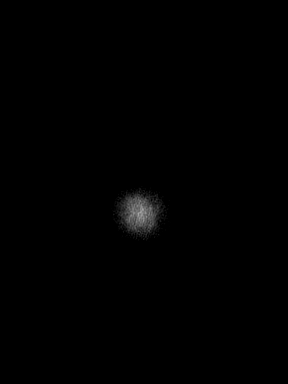
[im 47/47]
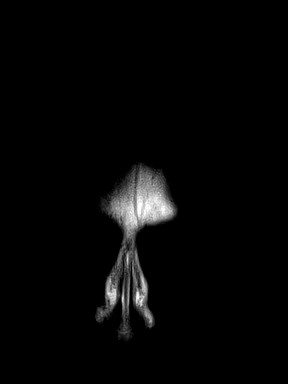

[Series 11: T2 post-contrast · axial · 1.0mm · 0.86mm/px · z∈[-60,+96]mm · 8 of 160 slices shown (2 of 2)]
[im 1/160]
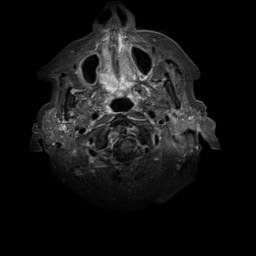
[im 23/160]
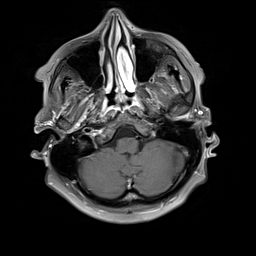
[im 46/160]
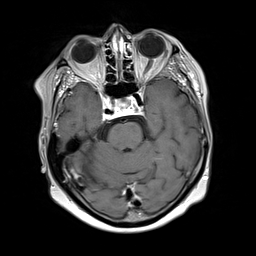
[im 69/160]
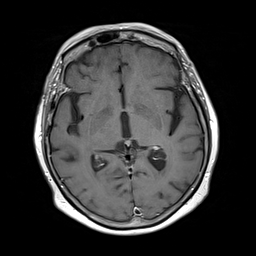
[im 91/160]
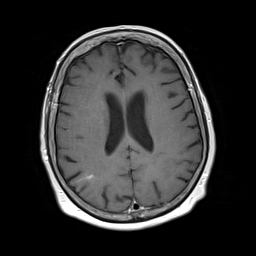
[im 114/160]
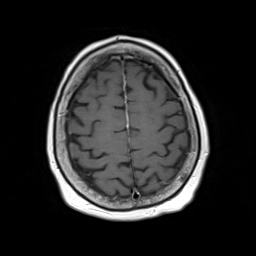
[im 137/160]
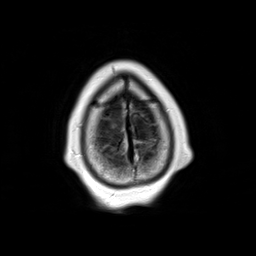
[im 160/160]
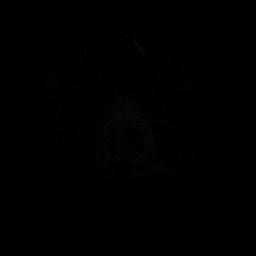

[Series 12: T1 post-contrast · axial · 1.0mm · 0.75mm/px · z∈[-62,+97]mm · 8 of 160 slices shown (1 of 2)]
[im 1/160]
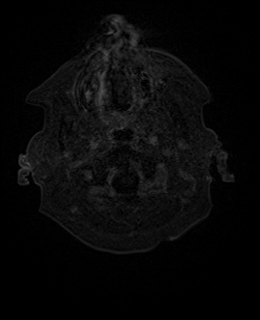
[im 23/160]
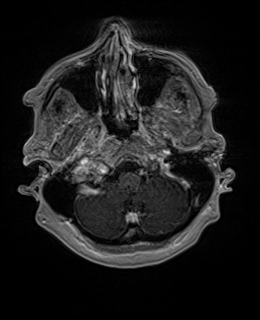
[im 46/160]
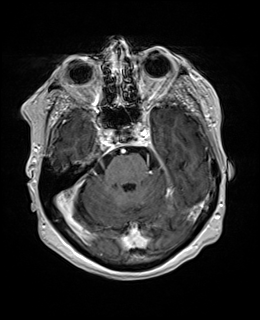
[im 69/160]
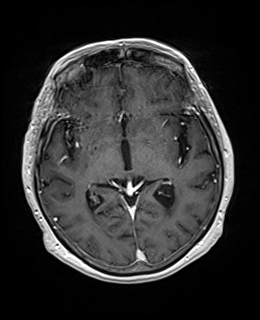
[im 91/160]
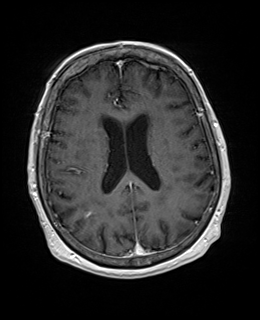
[im 114/160]
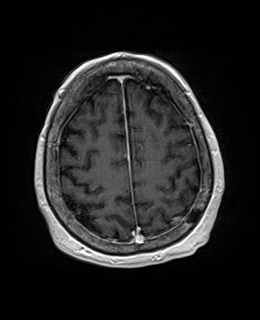
[im 137/160]
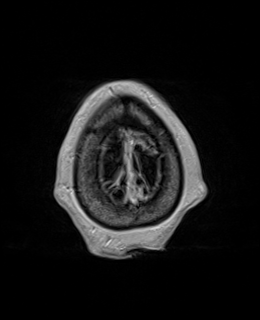
[im 160/160]
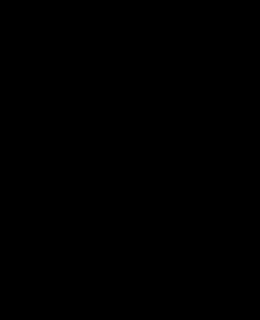

[Series 13: T1 post-contrast · coronal · 3.0mm · 0.57mm/px · 2 of 47 slices shown (2 of 2)]
[im 1/47]
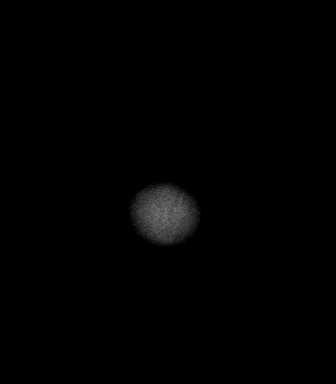
[im 47/47]
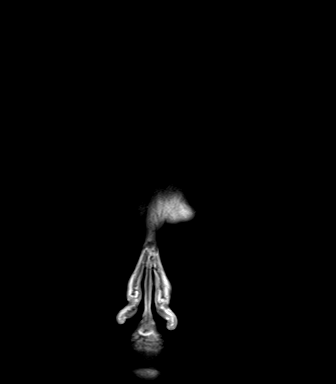

[Series 14: FLAIR post-contrast · sagittal · 3.0mm · 0.75mm/px · 1 of 20 slices shown]
[im 1/20]
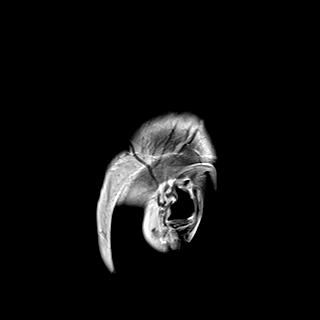

[48 of 48 positions shown; findings below may reference images not displayed]

FINDINGS: Brain: Stable subcentimeter enhancing lesion of the lateral right
orbital gyrus (series 11, image 62). No associated edema. Likely
corresponding susceptibility.

There is no acute infarction or intracranial hemorrhage. There is no
new mass or mass effect. There is no hydrocephalus or extra-axial
fluid collection. Ventricles are stable in size. Patchy and
confluent areas of T2 hyperintensity in the supratentorial white
matter probably reflects stable chronic microvascular ischemic
changes. Unchanged few scattered punctate foci of susceptibility
likely reflecting chronic microhemorrhages. The foci near the right
parietal developmental venous anomaly could also reflect occult
cavernous malformations.

Vascular: Major vessel flow voids at the skull base are preserved.

Skull and upper cervical spine: Normal marrow signal is preserved.
Upper cervical spine degenerative changes.

Sinuses/Orbits: Paranasal sinuses are aerated. Orbits are
unremarkable.

Other: Sella is unremarkable.  Mastoid air cells are clear.
IMPRESSION: Stable subcentimeter inferior right frontal lobe enhancing lesion.
This is indeterminate particularly given absence of edema but
presumed reflect a metastasis given metastatic disease elsewhere. No
new finding.

## 2021-09-06 MED ORDER — GADOBENATE DIMEGLUMINE 529 MG/ML IV SOLN
14.0000 mL | Freq: Once | INTRAVENOUS | Status: AC | PRN
  Administered 2021-09-06: 14 mL via INTRAVENOUS

## 2021-09-06 NOTE — Progress Notes (Signed)
BP (!) 155/67 (BP Location: Right Arm, Patient Position: Sitting, Cuff Size: Normal)   Pulse 79   Temp (!) 97.3 F (36.3 C) (Skin)   Resp 20   Ht 5\' 4"  (1.626 m)   Wt 144 lb 3.2 oz (65.4 kg)   SpO2 91%   BMI 24.75 kg/m  Patient in for consult for new mets to brain. Patient complains of dizziness only when she takes the medicine. States her body is not used to it yet. States she has headaches frequently Tylenol works. No seizures. No loss of vision. Not on Steroids.

## 2021-09-06 NOTE — Progress Notes (Signed)
Patient seen by RadOnc this morning. Plan for treatment to start on 09/13/21.  Oncology Nurse Navigator Documentation  Oncology Nurse Navigator Flowsheets 09/06/2021  Planned Course of Treatment Radiation  Phase of Treatment Radiation  Chemotherapy Pending- Reason: -  Chemotherapy Actual Start Date: -  Navigator Follow Up Date: 09/24/2021  Navigator Follow Up Reason: Follow-up Appointment  Navigator Location [facility]  Navigator Encounter Type Follow-up Appt  Telephone -  Treatment Initiated Date -  Patient Visit Type MedOnc  Treatment Phase Active Tx  Barriers/Navigation Needs Coordination of Care;Education  Education -  Interventions None Required  Acuity Level 2-Minimal Needs (1-2 Barriers Identified)  Referrals -  Coordination of Care -  Education Method -  Support Groups/Services Friends and Family  Time Spent with Patient 30

## 2021-09-09 ENCOUNTER — Ambulatory Visit
Admission: RE | Admit: 2021-09-09 | Discharge: 2021-09-09 | Disposition: A | Discharge status: Home / Self Care | Payer: Medicare Other | Source: Ambulatory Visit | Attending: Radiation Oncology | Admitting: Radiation Oncology

## 2021-09-09 ENCOUNTER — Other Ambulatory Visit: Payer: Self-pay

## 2021-09-09 ENCOUNTER — Ambulatory Visit: Payer: Medicare Other | Admitting: Radiation Oncology

## 2021-09-09 ENCOUNTER — Ambulatory Visit: Payer: Medicare Other

## 2021-09-09 VITALS — BP 139/60 | HR 92 | Temp 97.7°F | Resp 20 | Wt 145.4 lb

## 2021-09-09 DIAGNOSIS — Z510 Encounter for antineoplastic radiation therapy: Secondary | ICD-10-CM | POA: Insufficient documentation

## 2021-09-09 DIAGNOSIS — C7801 Secondary malignant neoplasm of right lung: Secondary | ICD-10-CM | POA: Insufficient documentation

## 2021-09-09 DIAGNOSIS — C7931 Secondary malignant neoplasm of brain: Secondary | ICD-10-CM | POA: Diagnosis present

## 2021-09-09 DIAGNOSIS — C4362 Malignant melanoma of left upper limb, including shoulder: Secondary | ICD-10-CM | POA: Insufficient documentation

## 2021-09-09 MED ORDER — SODIUM CHLORIDE 0.9% FLUSH
10.0000 mL | Freq: Once | INTRAVENOUS | Status: AC
  Administered 2021-09-09: 10 mL via INTRAVENOUS

## 2021-09-09 NOTE — Progress Notes (Signed)
Has armband been applied?  Yes.    Does patient have an allergy to IV contrast dye?: No.   Has patient ever received premedication for IV contrast dye?: No.   Does patient take metformin?: No.  Date of lab work: August 14, 2021 BUN: 30 CR: 0.77 eGFR: >60  IV site: forearm left, condition patent and no redness  Has IV site been added to flowsheet?  Yes.    BP 139/60 (BP Location: Left Arm, Patient Position: Sitting)   Pulse 92   Temp 97.7 F (36.5 C) (Temporal)   Resp 20   Wt 145 lb 6 oz (65.9 kg)   SpO2 93%   BMI 24.95 kg/m

## 2021-09-10 ENCOUNTER — Ambulatory Visit: Payer: Medicare Other

## 2021-09-11 DIAGNOSIS — Z510 Encounter for antineoplastic radiation therapy: Secondary | ICD-10-CM | POA: Diagnosis not present

## 2021-09-13 ENCOUNTER — Encounter: Payer: Self-pay | Admitting: Radiation Oncology

## 2021-09-13 ENCOUNTER — Other Ambulatory Visit: Payer: Self-pay

## 2021-09-13 ENCOUNTER — Ambulatory Visit
Admission: RE | Admit: 2021-09-13 | Discharge: 2021-09-13 | Disposition: A | Discharge status: Home / Self Care | Payer: Medicare Other | Source: Ambulatory Visit | Attending: Radiation Oncology | Admitting: Radiation Oncology

## 2021-09-13 VITALS — BP 147/69 | HR 78 | Temp 97.3°F | Resp 18

## 2021-09-13 DIAGNOSIS — C7931 Secondary malignant neoplasm of brain: Secondary | ICD-10-CM

## 2021-09-13 DIAGNOSIS — Z510 Encounter for antineoplastic radiation therapy: Secondary | ICD-10-CM | POA: Diagnosis not present

## 2021-09-13 NOTE — Progress Notes (Signed)
Peggy Howard rested with [name] for 30 minutes following her [name] treatment.  Patient denies headache, dizziness, nausea, diplopia or ringing in the ears. Denies fatigue. Patient without complaints. Understands to avoid strenuous activity for the next 24 hours and call [phone] with needs.   Patient walked out of the nursing clinic with family without difficulty.  BP (!) 147/69 (BP Location: Left Arm)   Pulse 78   Temp (!) 97.3 F (36.3 C)   Resp 18   SpO2 95%    Peggy Howard, BSN

## 2021-09-16 NOTE — Progress Notes (Signed)
  Radiation Oncology         [phone]) [phone] ________________________________  Name: Peggy Howard MRN: [id number]  Date: 09/13/2021  DOB: 1932-11-28  Stereotactic Treatment Procedure Note  SPECIAL TREATMENT PROCEDURE  Outpatient    ICD-10-CM   1. Brain metastasis ([place])  C79.31       3D TREATMENT PLANNING AND DOSIMETRY:  The patient's radiation plan was reviewed and approved by neurosurgery and radiation oncology prior to treatment.  It showed 3-dimensional radiation distributions overlaid onto the planning CT/MRI image set.  [facility] for the target structures as well as the organs at risk were reviewed. The documentation of the 3D plan and dosimetry are filed in the radiation oncology EMR.  NARRATIVE:  Peggy Howard was brought to the TrueBeam stereotactic radiation treatment machine and placed supine on the CT couch. The head frame was applied, and the patient was set up for stereotactic radiosurgery.  Neurosurgery was present for the set-up and delivery  SIMULATION VERIFICATION:  In the couch zero-angle position, the patient underwent Exactrac imaging using the Brainlab system with orthogonal KV images.  These were carefully aligned and repeated to confirm treatment position for each of the isocenters.  The Exactrac snap film verification was repeated at each couch angle.  SPECIAL TREATMENT PROCEDURE: Peggy Howard received stereotactic radiosurgery to the following targets: Right frontal lobe metastasis was treated using SRS 3D technique to a dose of 20 Gray in 1 fraction.  6 MV FFF photons were used. ExacTrac Snap verification was performed for each couch angle.  This constitutes a special treatment procedure due to the ablative dose delivered and the technical nature of treatment.  This highly technical modality of treatment ensures that the ablative dose is centered on the patient's tumor while sparing normal tissues from excessive dose and risk of detrimental  effects.  STEREOTACTIC TREATMENT MANAGEMENT:  Following delivery, the patient was transported to nursing in stable condition and monitored for possible acute effects.  Vital signs were recorded BP (!) 147/69 (BP Location: Left Arm)   Pulse 78   Temp (!) 97.3 F (36.3 C)   Resp 18   SpO2 95% . The patient tolerated treatment without significant acute effects, and was discharged to home in stable condition.    PLAN: Follow-up in one month.  ________________________________   [name], MD

## 2021-09-19 NOTE — Addendum Note (Signed)
Encounter addended by: Consuella Lose, MD on: 09/19/2021 10:49 AM  Actions taken: Clinical Note Signed

## 2021-09-19 NOTE — Op Note (Signed)
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 [name], MD  TECHNIQUE:  The patient underwent a radiation treatment planning session in the radiation oncology simulation suite under the care of the radiation oncology physician and physicist.  I participated closely in the radiation treatment planning afterwards. The patient underwent planning CT which was fused to 3T high resolution MRI with 1 mm axial slices.  These images were fused on the planning system.  Radiation oncology contoured the gross target volume and subsequently expanded this to yield the Planning Target Volume. I actively participated in the planning process.  I helped to define and review the target contours and also the contours of the optic pathway, eyes, brainstem and selected nearby organs at risk.  All the dose constraints for critical structures were reviewed and compared to AAPM Task Group 101.  The prescription dose conformity was reviewed.  I approved the plan electronically.    Accordingly, Goldy Alinger Graffius was brought to the TrueBeam stereotactic radiation treatment linac and placed in the custom immobilization mask.  The patient was aligned according to the IR fiducial markers with BrainLab Exactrac, then orthogonal x-rays were used in ExacTrac with the 6DOF robotic table and the shifts were made to align the patient  This lesion was complex because it was adjacent (within 36mm) to the optic nerve.  Teneshia Alinger Rosiak received stereotactic radiosurgery uneventfully to a prescription dose of 20Gy.  The detailed description of the procedure is recorded in the radiation oncology procedure note.  I was present for the duration of the  procedure.  DISPOSITION:  Following delivery, the patient was transported to nursing in stable condition and monitored for possible acute effects to be discharged to home in stable condition with follow-up in one month.  Consuella Lose, MD [facility]

## 2021-09-20 ENCOUNTER — Encounter: Payer: Self-pay | Admitting: Pulmonary Disease

## 2021-09-20 ENCOUNTER — Other Ambulatory Visit: Payer: Self-pay

## 2021-09-20 ENCOUNTER — Ambulatory Visit (INDEPENDENT_AMBULATORY_CARE_PROVIDER_SITE_OTHER): Payer: Medicare Other | Admitting: Pulmonary Disease

## 2021-09-20 VITALS — BP 130/68 | HR 82 | Temp 98.1°F | Ht 64.0 in | Wt 147.2 lb

## 2021-09-20 DIAGNOSIS — J910 Malignant pleural effusion: Secondary | ICD-10-CM | POA: Diagnosis not present

## 2021-09-20 DIAGNOSIS — C7801 Secondary malignant neoplasm of right lung: Secondary | ICD-10-CM

## 2021-09-20 NOTE — Patient Instructions (Signed)
Thank you for visiting Dr. Sharmayne Jablon at [facility]. Today we recommend the following:  Return if symptoms worsen or fail to improve.    Please do your part to reduce the spread of COVID-19.  

## 2021-09-20 NOTE — Progress Notes (Signed)
Synopsis: Referred in August 2022 for Pleurx catheter placement, PCP: By No ref. provider found  Subjective:   PATIENT ID: Peggy Howard GENDER: female DOB: 10/10/1933, MRN: [id number]  Chief Complaint  Patient presents with   Follow-up    Pt states she has been doing okay since last visit. States her breathing is getting better.     This is an 87 year old gentleman, past medical history of malignant pleural effusion, metastatic melanoma.  Recently admitted to the hospital.  Pleurx catheter was placed during this hospitalization by Marni Griffon, NP and Dr. Ina Homes, MD.He has established care with medical oncology.  Here today for follow-up after hospitalization.  Has seen Dr. Burney Gauze.  Initial melanoma was subungual.  Now with recurrence of malignant effusion.  Start patient recently on pembrolizumab.  OV 07/11/2021: Here today for evaluation of Pleurx catheter and site inspection.  Also needs sutures removed.  Doing well with her drainage.  Only draining about 20 cc every few days.  She has talked with her oncologist and they recommend [name] keeping this in place for the next little while as she starts treatments.  Ov 09/20/2021: This is an 87 year old that had a Pleurx catheter placed.  Stopped draining and needed it removed.  She had it removed a few weeks ago.  Here today for follow-up from respiratory standpoint she is doing well this was actually an appointment set up for follow-up after her August visit but she has subsequently already been seen and the catheter has been removed.  She is tolerating treatments well from medical oncology standpoint and radiation.   Oncology History  Melanoma metastatic to lung [facility])  06/21/2021 Initial Diagnosis   Melanoma metastatic to lung [facility])   06/21/2021 Cancer Staging   Staging form: Melanoma of the Skin, AJCC 8th Edition - Clinical stage from 06/21/2021: Stage IV (cTX, cNX, cM1b(1)) - Signed by Volanda Napoleon, MD on  06/21/2021 Stage prefix: Initial diagnosis   07/17/2021 -  Chemotherapy    Patient is on Treatment Plan: MELANOMA PEMBROLIZUMAB Q21D         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   Melanoma metastatic to brain [facility])    Melanoma metastatic to lung ([place]) 06/21/2021   Rectal prolapse      No family history on file.   Past Surgical History:  Procedure Laterality Date   ABDOMINAL HYSTERECTOMY     amputation of left finger      2021   BIOPSY  03/27/2021   Procedure: BIOPSY;  Surgeon: Irving Copas., MD;  Location: [facility];  Service: Gastroenterology;;   COLONOSCOPY WITH PROPOFOL N/A 03/27/2021   Procedure: COLONOSCOPY WITH PROPOFOL;  Surgeon: Irving Copas., MD;  Location: [facility];  Service: Gastroenterology;  Laterality: N/A;   JOINT REPLACEMENT     POLYPECTOMY  03/27/2021   Procedure: POLYPECTOMY;  Surgeon: Mansouraty, Telford Nab., MD;  Location: [facility];  Service: Gastroenterology;;   REMOVAL OF PLEURAL DRAINAGE CATHETER  09/02/2021   Procedure: REMOVAL OF PLEURAL DRAINAGE CATHETER;  Surgeon: Freddi Starr, MD;  Location: [facility];  Service: Pulmonary;;   REPLACEMENT TOTAL KNEE Bilateral    XI ROBOT ASSISTED RECTOPEXY N/A 06/12/2021   Procedure: XI ROBOT ASSISTED RECTOPEXY;  Surgeon: Leighton Ruff, MD;  Location: [facility];  Service: General;  Laterality: N/A;    Social History   Socioeconomic History   Marital status: Widowed  Spouse name: Not on file   Number of children: Not on file   Years of education: Not on file   Highest education level: Not on file  Occupational History   Not on file  Tobacco Use   Smoking status: Former    Packs/day: 2.00    Years: 25.00    Pack years: 50.00    Types: Cigarettes    Quit date: 61    Years since quitting: 45.8   Smokeless tobacco: Never  Vaping Use   Vaping Use: Never used   Substance and Sexual Activity   Alcohol use: No   Drug use: Yes    Types: Benzodiazepines    Comment: Denies any use. 06/21/21   Sexual activity: Never  Other Topics Concern   Not on file  Social History Narrative   Not on file   Social Determinants of Health   Financial Resource Strain: Not on file  Food Insecurity: Not on file  Transportation Needs: Not on file  Physical Activity: Not on file  Stress: Not on file  Social Connections: Not on file  Intimate Partner Violence: Not on file     Allergies  Allergen Reactions   Vitamin D Analogs Other (See Comments)    "dizziness"     Outpatient Medications Prior to Visit  Medication Sig Dispense Refill   acetaminophen (TYLENOL) 500 MG tablet Take 1,000 mg by mouth every 6 (six) hours as needed for mild pain, fever or headache.     albuterol (VENTOLIN HFA) 108 (90 Base) MCG/ACT inhaler Inhale 2 puffs into the lungs every 6 (six) hours as needed for wheezing or shortness of breath. 8 g 2   ferrous sulfate 325 (65 FE) MG tablet Take 325 mg by mouth daily with breakfast.     fluticasone-salmeterol (ADVAIR DISKUS) 250-50 MCG/ACT AEPB Inhale 1 puff into the lungs in the morning and at bedtime. 1 each 1   imatinib (GLEEVEC) 400 MG tablet Take 1 tablet (400 mg total) by mouth daily. Take with meals and large glass of water.Caution:Chemotherapy. 30 tablet 5   Multiple Vitamins-Minerals (PRESERVISION AREDS 2 PO) Take 1 tablet by mouth 2 (two) times daily.     psyllium (METAMUCIL SMOOTH TEXTURE) 58.6 % powder Take 1 packet by mouth in the morning and at bedtime.     No facility-administered medications prior to visit.    Review of Systems  Constitutional:  Negative for chills, fever, malaise/fatigue and weight loss.  HENT:  Negative for hearing loss, sore throat and tinnitus.   Eyes:  Negative for blurred vision and double vision.  Respiratory:  Positive for shortness of breath. Negative for cough, hemoptysis, sputum production,  wheezing and stridor.   Cardiovascular:  Negative for chest pain, palpitations, orthopnea, leg swelling and PND.  Gastrointestinal:  Negative for abdominal pain, constipation, diarrhea, heartburn, nausea and vomiting.  Genitourinary:  Negative for dysuria, hematuria and urgency.  Musculoskeletal:  Negative for joint pain and myalgias.  Skin:  Negative for itching and rash.  Neurological:  Negative for dizziness, tingling, weakness and headaches.  Endo/Heme/Allergies:  Negative for environmental allergies. Does not bruise/bleed easily.  Psychiatric/Behavioral:  Negative for depression. The patient is not nervous/anxious and does not have insomnia.   All other systems reviewed and are negative.   Objective:  Physical Exam Vitals reviewed.  Constitutional:      General: She is not in acute distress.    Appearance: She is well-developed.  HENT:     Head: Normocephalic and atraumatic.  Eyes:     General: No scleral icterus.    Conjunctiva/sclera: Conjunctivae normal.     Pupils: Pupils are equal, round, and reactive to light.  Neck:     Vascular: No JVD.     Trachea: No tracheal deviation.  Cardiovascular:     Rate and Rhythm: Normal rate and regular rhythm.     Heart sounds: Normal heart sounds. No murmur heard. Pulmonary:     Effort: Pulmonary effort is normal. No tachypnea, accessory muscle usage or respiratory distress.     Breath sounds: No stridor. No wheezing, rhonchi or rales.     Comments: Right chest wall, previous incision sites from indwelling pleural catheter well-healed small scars present. Musculoskeletal:        General: No tenderness.     Cervical back: Neck supple.  Lymphadenopathy:     Cervical: No cervical adenopathy.  Skin:    General: Skin is warm and dry.     Capillary Refill: Capillary refill takes less than 2 seconds.     Findings: No rash.  Neurological:     Mental Status: She is alert and oriented to person, place, and time.  Psychiatric:         Behavior: Behavior normal.     Vitals:   09/20/21 1347  BP: 130/68  Pulse: 82  Temp: 98.1 F (36.7 C)  TempSrc: Oral  SpO2: 94%  Weight: 147 lb 3.2 oz (66.8 kg)  Height: 5\' 4"  (1.626 m)   94% on RA BMI Readings from Last 3 Encounters:  09/20/21 25.27 kg/m  09/09/21 24.99 kg/m  09/09/21 24.95 kg/m   Wt Readings from Last 3 Encounters:  09/20/21 147 lb 3.2 oz (66.8 kg)  09/09/21 145 lb 9.6 oz (66 kg)  09/09/21 145 lb 6 oz (65.9 kg)     CBC    Component Value Date/Time   WBC 6.8 08/14/2021 1302   WBC 12.6 (H) 06/28/2021 0412   RBC 4.00 08/14/2021 1302   HGB 11.4 (L) 08/14/2021 1302   HGB 13.8 06/26/2017 1045   HCT 36.0 08/14/2021 1302   HCT 41.6 06/26/2017 1045   PLT 354 08/14/2021 1302   PLT 555 (H) 06/26/2017 1045   MCV 90.0 08/14/2021 1302   MCV 95 06/26/2017 1045   MCH 28.5 08/14/2021 1302   MCHC 31.7 08/14/2021 1302   RDW 13.2 08/14/2021 1302   RDW 13.3 06/26/2017 1045   LYMPHSABS 1.5 08/14/2021 1302   LYMPHSABS 2.0 06/26/2017 1045   MONOABS 0.5 08/14/2021 1302   EOSABS 0.1 08/14/2021 1302   EOSABS 0.1 06/26/2017 1045   BASOSABS 0.0 08/14/2021 1302   BASOSABS 0.0 06/26/2017 1045     Chest Imaging: 06/27/2021 chest x-ray: Right chest tube in place, decreased pleural fluid. The patient's images have been independently reviewed by me.    Pulmonary Functions Testing Results: No flowsheet data found.  FeNO:   Pathology:   Echocardiogram:   Heart Catheterization:     Assessment & Plan:     ICD-10-CM   1. Malignant pleural effusion  J91.0     2. Secondary malignant melanoma of right lung ([place])  C78.01        Discussion:  This is a 87 year old female, melanoma, subungual now with recurrent metastatic disease to the pleural space, status post IPC, and eventually removal.  Plan: Looked at previous insertion site Appears clean dry and intact No evidence of cellulitis well-healed small scar present. If patient's effusion returns we can  always consider repeat thoracentesis or  replacement of catheter in the future. Looks like she is tolerating treatments well. Can follow-up with [name] as needed Encourage continued follow-up with medical oncology  We appreciate consultation.  Patient can return to our clinic as needed.   Current Outpatient Medications:    acetaminophen (TYLENOL) 500 MG tablet, Take 1,000 mg by mouth every 6 (six) hours as needed for mild pain, fever or headache., Disp: , Rfl:    albuterol (VENTOLIN HFA) 108 (90 Base) MCG/ACT inhaler, Inhale 2 puffs into the lungs every 6 (six) hours as needed for wheezing or shortness of breath., Disp: 8 g, Rfl: 2   ferrous sulfate 325 (65 FE) MG tablet, Take 325 mg by mouth daily with breakfast., Disp: , Rfl:    fluticasone-salmeterol (ADVAIR DISKUS) 250-50 MCG/ACT AEPB, Inhale 1 puff into the lungs in the morning and at bedtime., Disp: 1 each, Rfl: 1   imatinib (GLEEVEC) 400 MG tablet, Take 1 tablet (400 mg total) by mouth daily. Take with meals and large glass of water.Caution:Chemotherapy., Disp: 30 tablet, Rfl: 5   Multiple Vitamins-Minerals (PRESERVISION AREDS 2 PO), Take 1 tablet by mouth 2 (two) times daily., Disp: , Rfl:    psyllium (METAMUCIL SMOOTH TEXTURE) 58.6 % powder, Take 1 packet by mouth in the morning and at bedtime., Disp: , Rfl:    Garner Nash, DO [facility] 09/20/2021 2:12 PM

## 2021-09-24 ENCOUNTER — Encounter: Payer: Self-pay | Admitting: Family

## 2021-09-24 ENCOUNTER — Other Ambulatory Visit: Payer: Self-pay

## 2021-09-24 ENCOUNTER — Inpatient Hospital Stay: Payer: Medicare Other | Attending: Hematology & Oncology

## 2021-09-24 ENCOUNTER — Inpatient Hospital Stay (HOSPITAL_BASED_OUTPATIENT_CLINIC_OR_DEPARTMENT_OTHER): Payer: Medicare Other | Admitting: Family

## 2021-09-24 ENCOUNTER — Encounter: Payer: Self-pay | Admitting: *Deleted

## 2021-09-24 VITALS — BP 137/72 | HR 75 | Temp 98.4°F | Resp 17 | Wt 145.1 lb

## 2021-09-24 DIAGNOSIS — C7801 Secondary malignant neoplasm of right lung: Secondary | ICD-10-CM | POA: Insufficient documentation

## 2021-09-24 DIAGNOSIS — J90 Pleural effusion, not elsewhere classified: Secondary | ICD-10-CM | POA: Diagnosis not present

## 2021-09-24 DIAGNOSIS — C7931 Secondary malignant neoplasm of brain: Secondary | ICD-10-CM | POA: Diagnosis present

## 2021-09-24 DIAGNOSIS — C4362 Malignant melanoma of left upper limb, including shoulder: Secondary | ICD-10-CM | POA: Diagnosis not present

## 2021-09-24 DIAGNOSIS — Z79899 Other long term (current) drug therapy: Secondary | ICD-10-CM | POA: Diagnosis not present

## 2021-09-24 DIAGNOSIS — E039 Hypothyroidism, unspecified: Secondary | ICD-10-CM | POA: Diagnosis not present

## 2021-09-24 LAB — CMP (CANCER CENTER ONLY)
ALT: 14 U/L (ref 0–44)
AST: 24 U/L (ref 15–41)
Albumin: 3.7 g/dL (ref 3.5–5.0)
Alkaline Phosphatase: 71 U/L (ref 38–126)
Anion gap: 8 (ref 5–15)
BUN: 18 mg/dL (ref 8–23)
CO2: 29 mmol/L (ref 22–32)
Calcium: 9.1 mg/dL (ref 8.9–10.3)
Chloride: 103 mmol/L (ref 98–111)
Creatinine: 0.86 mg/dL (ref 0.44–1.00)
GFR, Estimated: >60 mL/min (ref >60)
Glucose, Bld: 119 mg/dL — ABNORMAL HIGH (ref 70–99)
Potassium: 3.9 mmol/L (ref 3.5–5.1)
Sodium: 140 mmol/L (ref 135–145)
Total Bilirubin: 0.5 mg/dL (ref 0.3–1.2)
Total Protein: 6.2 g/dL — ABNORMAL LOW (ref 6.5–8.1)

## 2021-09-24 LAB — CBC WITH DIFFERENTIAL (CANCER CENTER ONLY)
Abs Immature Granulocytes: 0.03 10*3/uL (ref 0.00–0.07)
Basophils Absolute: 0.1 10*3/uL (ref 0.0–0.1)
Basophils Relative: 1 %
Eosinophils Absolute: 0.2 10*3/uL (ref 0.0–0.5)
Eosinophils Relative: 2 %
HCT: 36.7 % (ref 36.0–46.0)
Hemoglobin: 11.7 g/dL — ABNORMAL LOW (ref 12.0–15.0)
Immature Granulocytes: 0 %
Lymphocytes Relative: 23 %
Lymphs Abs: 1.6 10*3/uL (ref 0.7–4.0)
MCH: 28.7 pg (ref 26.0–34.0)
MCHC: 31.9 g/dL (ref 30.0–36.0)
MCV: 90.2 fL (ref 80.0–100.0)
Monocytes Absolute: 0.5 10*3/uL (ref 0.1–1.0)
Monocytes Relative: 6 %
Neutro Abs: 4.8 10*3/uL (ref 1.7–7.7)
Neutrophils Relative %: 68 %
Platelet Count: 277 10*3/uL (ref 150–400)
RBC: 4.07 MIL/uL (ref 3.87–5.11)
RDW: 16.4 % — ABNORMAL HIGH (ref 11.5–15.5)
WBC Count: 7.2 10*3/uL (ref 4.0–10.5)
nRBC: 0 % (ref 0.0–0.2)

## 2021-09-24 LAB — LACTATE DEHYDROGENASE: LDH: 291 U/L — ABNORMAL HIGH (ref 98–192)

## 2021-09-24 NOTE — Progress Notes (Signed)
Patient given Ensure Clear samples as she doesn't like 'milk' drinks.   Oncology Nurse Navigator Documentation  Oncology Nurse Navigator Flowsheets 09/24/2021  Planned Course of Treatment -  Phase of Treatment Radiation  Chemotherapy Pending- Reason: -  Chemotherapy Actual Start Date: -  Radiation Actual Start Date: 09/13/2021  Radiation Actual End Date: 09/13/2021  Navigator Follow Up Date: 10/29/2021  Navigator Follow Up Reason: Follow-up Appointment  Navigator Location [facility]  Navigator Encounter Type Appt/Treatment Plan Review  Telephone -  Treatment Initiated Date -  Patient Visit Type MedOnc  Treatment Phase Active Tx  Barriers/Navigation Needs Coordination of Care;Education  Education -  Interventions None Required  Acuity Level 2-Minimal Needs (1-2 Barriers Identified)  Referrals -  Coordination of Care -  Education Method -  Support Groups/Services Friends and Family  Time Spent with Patient 15

## 2021-09-24 NOTE — Progress Notes (Signed)
Hematology and Oncology Follow Up Visit  Peggy Howard [id number] 1933/10/19 87 y.o. 09/24/2021   Principle Diagnosis:  Recurrent melanoma-right pleural effusion  -- BRAFwt/ c-Kit (+)  Past Therapy: Keytruda 200 mg IV q 3 week -- s/p cycle #1 -- d/c on 08/14/2021 for progression   Current Therapy:        Gleevec 400 mg po q day -- started 08/19/2021   Interim History:  Peggy Howard is here today for follow-up. She is tolerating Gleevec nicely and notes that her SOB is much improved. She still has some fatigue at times and takes a break to rest as needed.  She had SRS for right frontal lobe metastasis with Dr. Isidore Moos on 09/13/2021. She states that this went well and she has not had any issues.  No fever, chills, n/v, cough, rash, dizziness, SOB, chest pain, palpitations, abdominal pain or changes in bowel or bladder habits.  No swelling, tenderness, numbness or tingling in her extremities at this time. She has occasional puffiness in her ankles that comes and goes.  No falls or syncope to report.  She is avoiding sugar and trying to increase her protein intake. Unfortunately, she can not tolerate the Boost or Ensure supplements due to the dairy and sugar content. She states that her appetite is down and her taste is off. She is staying well hydrated. Her weight is stable at 145 lbs.   ECOG Performance Status: 1 - Symptomatic but completely ambulatory  Medications:  Allergies as of 09/24/2021       Reactions   Vitamin D Analogs Other (See Comments)   "dizziness"        Medication List        Accurate as of September 24, 2021  2:24 PM. If you have any questions, ask your nurse or doctor.          acetaminophen 500 MG tablet Commonly known as: TYLENOL Take 1,000 mg by mouth every 6 (six) hours as needed for mild pain, fever or headache.   albuterol 108 (90 Base) MCG/ACT inhaler Commonly known as: VENTOLIN HFA Inhale 2 puffs into the lungs every 6 (six) hours as needed  for wheezing or shortness of breath.   ferrous sulfate 325 (65 FE) MG tablet Take 325 mg by mouth daily with breakfast.   fluticasone-salmeterol 250-50 MCG/ACT Aepb Commonly known as: Advair Diskus Inhale 1 puff into the lungs in the morning and at bedtime.   imatinib 400 MG tablet Commonly known as: GLEEVEC Take 1 tablet (400 mg total) by mouth daily. Take with meals and large glass of water.Caution:Chemotherapy.   Metamucil Smooth Texture 58.6 % powder Generic drug: psyllium Take 1 packet by mouth in the morning and at bedtime.   PRESERVISION AREDS 2 PO Take 1 tablet by mouth 2 (two) times daily.        Allergies:  Allergies  Allergen Reactions   Vitamin D Analogs Other (See Comments)    "dizziness"    Past Medical History, Surgical history, Social history, and Family History were reviewed and updated.  Review of Systems: All other 10 point review of systems is negative.   Physical Exam:  weight is 145 lb 1.9 oz (65.8 kg). Her oral temperature is 98.4 F (36.9 C). Her blood pressure is 137/72 and her pulse is 75. Her respiration is 17 and oxygen saturation is 96%.   Wt Readings from Last 3 Encounters:  09/24/21 145 lb 1.9 oz (65.8 kg)  09/20/21 147 lb 3.2 oz (66.8  kg)  09/09/21 145 lb 9.6 oz (66 kg)    Ocular: Sclerae unicteric, pupils equal, round and reactive to light Ear-nose-throat: Oropharynx clear, dentition fair Lymphatic: No cervical or supraclavicular adenopathy Lungs no rales or rhonchi, good excursion bilaterally Heart regular rate and rhythm, no murmur appreciated Abd soft, nontender, positive bowel sounds MSK no focal spinal tenderness, no joint edema Neuro: non-focal, well-oriented, appropriate affect Breasts: Deferred   Lab Results  Component Value Date   WBC 7.2 09/24/2021   HGB 11.7 (L) 09/24/2021   HCT 36.7 09/24/2021   MCV 90.2 09/24/2021   PLT 277 09/24/2021   No results found for: FERRITIN, IRON, TIBC, UIBC, IRONPCTSAT Lab  Results  Component Value Date   RBC 4.07 09/24/2021   No results found for: KPAFRELGTCHN, LAMBDASER, KAPLAMBRATIO No results found for: IGGSERUM, IGA, IGMSERUM No results found for: Odetta Pink, SPEI   Chemistry      Component Value Date/Time   NA 140 09/24/2021 1331   NA 144 07/16/2017 1029   K 3.9 09/24/2021 1331   CL 103 09/24/2021 1331   CO2 29 09/24/2021 1331   BUN 18 09/24/2021 1331   BUN 14 07/16/2017 1029   CREATININE 0.86 09/24/2021 1331      Component Value Date/Time   CALCIUM 9.1 09/24/2021 1331   ALKPHOS 71 09/24/2021 1331   AST 24 09/24/2021 1331   ALT 14 09/24/2021 1331   BILITOT 0.5 09/24/2021 1331       Impression and Plan: Peggy Howard is a very pleasant 87 yo caucasian woman with recurrent metastatic subungual melanoma removed from the finger of her left hand.  She had SRS for the right frontal lobe metastasis of the brain with Dr. Isidore Moos.  She is tolerating Gleevec nicely and feels that her improvement in SOB is a sign that it is working.  We will plan to repeat scans to assess response in another 8 weeks.  Follow-up in 1 month.  She can contact our office with any questions or concerns.   Lottie Dawson, NP 11/1/20222:24 PM

## 2021-09-25 LAB — T4: T4, Total: 9.8 ug/dL (ref 4.5–12.0)

## 2021-09-25 LAB — TSH: TSH: 0.567 u[IU]/mL (ref 0.308–3.960)

## 2021-10-16 ENCOUNTER — Telehealth: Payer: Self-pay | Admitting: Gastroenterology

## 2021-10-16 NOTE — Telephone Encounter (Signed)
Called left patient voice mail to return call to schedule f/u appt with Amy Esterwood PA; Alonza Bogus, or Dr Rush Landmark.

## 2021-10-23 ENCOUNTER — Other Ambulatory Visit: Payer: Self-pay

## 2021-10-23 ENCOUNTER — Ambulatory Visit
Admission: RE | Admit: 2021-10-23 | Discharge: 2021-10-23 | Disposition: A | Discharge status: Home / Self Care | Payer: Medicare Other | Source: Ambulatory Visit | Attending: Radiation Oncology | Admitting: Radiation Oncology

## 2021-10-23 VITALS — BP 132/65 | HR 75 | Temp 97.8°F | Resp 20 | Ht 64.0 in | Wt 146.4 lb

## 2021-10-23 DIAGNOSIS — Z923 Personal history of irradiation: Secondary | ICD-10-CM | POA: Diagnosis not present

## 2021-10-23 DIAGNOSIS — C7931 Secondary malignant neoplasm of brain: Secondary | ICD-10-CM | POA: Diagnosis not present

## 2021-10-23 DIAGNOSIS — C3431 Malignant neoplasm of lower lobe, right bronchus or lung: Secondary | ICD-10-CM | POA: Insufficient documentation

## 2021-10-23 NOTE — Progress Notes (Signed)
Radiation Oncology         [phone]) [phone] ________________________________  Name: Kris Burd MRN: [id number]  Date: 10/23/2021  DOB: 1933-03-24  Follow-Up Visit Note  Outpatient  CC: Pcp, No  Ennever, Rudell Cobb, MD  Diagnosis and Prior Radiotherapy:    ICD-10-CM   1. Brain metastasis ([place])  C79.31       CHIEF COMPLAINT: Here for follow-up and surveillance of brain cancer  Narrative:  The patient returns today for routine follow-up.  Ms. Los presents today for follow-up affter completing single SRS treatment to right frontal lobe metastasis   Dose of Decadron, if applicable: Not currently prescribed  Recent neurologic symptoms, if any:  Seizures: Patient denies Headaches: Patient denies Nausea: Patient denies, but does report a decrease in her appetite since starting her imatinib  Wt Readings from Last 3 Encounters:  10/23/21 146 lb 6.4 oz (66.4 kg)  09/24/21 145 lb 1.9 oz (65.8 kg)  09/20/21 147 lb 3.2 oz (66.8 kg)   Dizziness/ataxia: Patient denies Difficulty with hand coordination: Patient denies  Focal numbness/weakness: Patient denies Visual deficits/changes: Reports mild blurriness (more prominent in right eye than left); states it's tolerable and doesn't effect her daily activities or driving Confusion/Memory deficits: Patient denies  Additional Complaints / other details: F/U with medical oncologist Dr. Burney Gauze on 10/29/2021. Overall, reports she feels well and is pleased with her continued recovery since completing her SRS treatment                              ALLERGIES:  is allergic to vitamin d analogs.  Meds: Current Outpatient Medications  Medication Sig Dispense Refill   acetaminophen (TYLENOL) 500 MG tablet Take 1,000 mg by mouth every 6 (six) hours as needed for mild pain, fever or headache.     albuterol (VENTOLIN HFA) 108 (90 Base) MCG/ACT inhaler Inhale 2 puffs into the lungs every 6 (six) hours as needed for wheezing or shortness of  breath. 8 g 2   ferrous sulfate 325 (65 FE) MG tablet Take 325 mg by mouth daily with breakfast.     fluticasone-salmeterol (ADVAIR DISKUS) 250-50 MCG/ACT AEPB Inhale 1 puff into the lungs in the morning and at bedtime. 1 each 1   imatinib (GLEEVEC) 400 MG tablet Take 1 tablet (400 mg total) by mouth daily. Take with meals and large glass of water.Caution:Chemotherapy. 30 tablet 5   Multiple Vitamins-Minerals (PRESERVISION AREDS 2 PO) Take 1 tablet by mouth 2 (two) times daily.     psyllium (METAMUCIL SMOOTH TEXTURE) 58.6 % powder Take 1 packet by mouth in the morning and at bedtime.     No current facility-administered medications for this encounter.    Physical Findings: The patient is in no acute distress. Patient is alert and oriented.  height is 5\' 4"  (1.626 m) and weight is 146 lb 6.4 oz (66.4 kg). Her temperature is 97.8 F (36.6 C). Her blood pressure is 132/65 and her pulse is 75. Her respiration is 20 and oxygen saturation is 96%. .    Neuro: non focal, independently ambulatory HEENT: no oral thrush. Hard of hearing. Heart RRR Chest CTAB  Lab Findings: Lab Results  Component Value Date   WBC 7.2 09/24/2021   HGB 11.7 (L) 09/24/2021   HCT 36.7 09/24/2021   MCV 90.2 09/24/2021   PLT 277 09/24/2021    Radiographic Findings: No results found.  Impression/Plan:  She is doing well s/p SRS  to brain  Followup MRI to be arranged (Brain) at 46mo post treatment; f/u with [facility] to review results at that time. She is pleased with this plan.  On date of service, in total, I spent 15 minutes on this encounter. Patient was seen in person.  _____________________________________   [name], MD

## 2021-10-23 NOTE — Progress Notes (Signed)
Peggy Howard presents today for follow-up affter completing single SRS treatment to right frontal lobe metastasis   Dose of Decadron, if applicable: Not currently prescribed  Recent neurologic symptoms, if any:  Seizures: Patient denies Headaches: Patient denies Nausea: Patient denies, but does report a decrease in her appetite since starting her imatinib  Wt Readings from Last 3 Encounters:  10/23/21 146 lb 6.4 oz (66.4 kg)  09/24/21 145 lb 1.9 oz (65.8 kg)  09/20/21 147 lb 3.2 oz (66.8 kg)   Dizziness/ataxia: Patient denies Difficulty with hand coordination: Patient denies  Focal numbness/weakness: Patient denies Visual deficits/changes: Reports mild blurriness (more prominent in right eye than left); states it's tolerable and doesn't effect her daily activities or driving Confusion/Memory deficits: Patient denies  Additional Complaints / other details: F/U with medical oncologist Dr. Burney Gauze on 10/29/2021. Overall, reports she feels well and is pleased with her continued recovery since completing her [place] treatment

## 2021-10-29 ENCOUNTER — Other Ambulatory Visit: Payer: Self-pay

## 2021-10-29 ENCOUNTER — Encounter: Payer: Self-pay | Admitting: Hematology & Oncology

## 2021-10-29 ENCOUNTER — Encounter: Payer: Self-pay | Admitting: *Deleted

## 2021-10-29 ENCOUNTER — Inpatient Hospital Stay (HOSPITAL_BASED_OUTPATIENT_CLINIC_OR_DEPARTMENT_OTHER): Payer: Medicare Other | Admitting: Hematology & Oncology

## 2021-10-29 ENCOUNTER — Inpatient Hospital Stay: Payer: Medicare Other | Attending: Hematology & Oncology

## 2021-10-29 VITALS — BP 155/75 | HR 75 | Temp 97.8°F | Resp 18 | Wt 148.0 lb

## 2021-10-29 DIAGNOSIS — J90 Pleural effusion, not elsewhere classified: Secondary | ICD-10-CM | POA: Insufficient documentation

## 2021-10-29 DIAGNOSIS — C4362 Malignant melanoma of left upper limb, including shoulder: Secondary | ICD-10-CM | POA: Insufficient documentation

## 2021-10-29 DIAGNOSIS — Z79899 Other long term (current) drug therapy: Secondary | ICD-10-CM | POA: Diagnosis not present

## 2021-10-29 DIAGNOSIS — C7931 Secondary malignant neoplasm of brain: Secondary | ICD-10-CM | POA: Insufficient documentation

## 2021-10-29 DIAGNOSIS — C7801 Secondary malignant neoplasm of right lung: Secondary | ICD-10-CM

## 2021-10-29 DIAGNOSIS — E039 Hypothyroidism, unspecified: Secondary | ICD-10-CM

## 2021-10-29 DIAGNOSIS — C7800 Secondary malignant neoplasm of unspecified lung: Secondary | ICD-10-CM

## 2021-10-29 LAB — CMP (CANCER CENTER ONLY)
ALT: 13 U/L (ref 0–44)
AST: 20 U/L (ref 15–41)
Albumin: 3.9 g/dL (ref 3.5–5.0)
Alkaline Phosphatase: 68 U/L (ref 38–126)
Anion gap: 7 (ref 5–15)
BUN: 18 mg/dL (ref 8–23)
CO2: 29 mmol/L (ref 22–32)
Calcium: 9.4 mg/dL (ref 8.9–10.3)
Chloride: 105 mmol/L (ref 98–111)
Creatinine: 0.81 mg/dL (ref 0.44–1.00)
GFR, Estimated: >60 mL/min (ref >60)
Glucose, Bld: 135 mg/dL — ABNORMAL HIGH (ref 70–99)
Potassium: 4.4 mmol/L (ref 3.5–5.1)
Sodium: 141 mmol/L (ref 135–145)
Total Bilirubin: 0.6 mg/dL (ref 0.3–1.2)
Total Protein: 6.1 g/dL — ABNORMAL LOW (ref 6.5–8.1)

## 2021-10-29 LAB — CBC WITH DIFFERENTIAL (CANCER CENTER ONLY)
Abs Immature Granulocytes: 0.05 10*3/uL (ref 0.00–0.07)
Basophils Absolute: 0 10*3/uL (ref 0.0–0.1)
Basophils Relative: 1 %
Eosinophils Absolute: 0.1 10*3/uL (ref 0.0–0.5)
Eosinophils Relative: 2 %
HCT: 38.5 % (ref 36.0–46.0)
Hemoglobin: 12.4 g/dL (ref 12.0–15.0)
Immature Granulocytes: 1 %
Lymphocytes Relative: 20 %
Lymphs Abs: 1.6 10*3/uL (ref 0.7–4.0)
MCH: 30 pg (ref 26.0–34.0)
MCHC: 32.2 g/dL (ref 30.0–36.0)
MCV: 93 fL (ref 80.0–100.0)
Monocytes Absolute: 0.5 10*3/uL (ref 0.1–1.0)
Monocytes Relative: 6 %
Neutro Abs: 5.7 10*3/uL (ref 1.7–7.7)
Neutrophils Relative %: 70 %
Platelet Count: 257 10*3/uL (ref 150–400)
RBC: 4.14 MIL/uL (ref 3.87–5.11)
RDW: 17.4 % — ABNORMAL HIGH (ref 11.5–15.5)
WBC Count: 8 10*3/uL (ref 4.0–10.5)
nRBC: 0 % (ref 0.0–0.2)

## 2021-10-29 LAB — LACTATE DEHYDROGENASE: LDH: 208 U/L — ABNORMAL HIGH (ref 98–192)

## 2021-10-29 LAB — TSH: TSH: 4.371 u[IU]/mL — ABNORMAL HIGH (ref 0.308–3.960)

## 2021-10-29 NOTE — Progress Notes (Signed)
Hematology and Oncology Follow Up Visit  Peggy Howard [id number] 30-Jan-1933 87 y.o. 10/29/2021   Principle Diagnosis:  Recurrent melanoma-right pleural effusion  -- BRAFwt/ c-Kit (+)  Current Therapy:   Keytruda 200 mg IV q 3 week -- s/p cycle #1 -- d/c on 08/14/2021 for progression Gleevec 400 mg po q day -- start on 08/19/2021     Interim History:  Peggy Howard is back for follow-up.  She..  She is tolerating this well.  She has had no problems with cough.  Has had no increased shortness of breath.  Chest wall pain.  Has been no diarrhea..  She did have for the CNS.  She got through this very nicely.  She has had no problems with bleeding.  There has  been no leg swelling.  There has been no problems with fever.  We will have to get her set up with another PET scan probably in January.  Her appetite has been good.  She had a very nice Thanksgiving with her family.  She is looking forward to Christmas.   Overall, I would have to say that her performance status right now is ECOG 1.   Medications:  Current Outpatient Medications:    acetaminophen (TYLENOL) 500 MG tablet, Take 1,000 mg by mouth every 6 (six) hours as needed for mild pain, fever or headache., Disp: , Rfl:    albuterol (VENTOLIN HFA) 108 (90 Base) MCG/ACT inhaler, Inhale 2 puffs into the lungs every 6 (six) hours as needed for wheezing or shortness of breath., Disp: 8 g, Rfl: 2   ferrous sulfate 325 (65 FE) MG tablet, Take 325 mg by mouth daily with breakfast., Disp: , Rfl:    fluticasone-salmeterol (ADVAIR DISKUS) 250-50 MCG/ACT AEPB, Inhale 1 puff into the lungs in the morning and at bedtime., Disp: 1 each, Rfl: 1   imatinib (GLEEVEC) 400 MG tablet, Take 1 tablet (400 mg total) by mouth daily. Take with meals and large glass of water.Caution:Chemotherapy., Disp: 30 tablet, Rfl: 5   Multiple Vitamins-Minerals (PRESERVISION AREDS 2 PO), Take 1 tablet by mouth 2 (two) times daily., Disp: , Rfl:    psyllium (METAMUCIL  SMOOTH TEXTURE) 58.6 % powder, Take 1 packet by mouth in the morning and at bedtime., Disp: , Rfl:   Allergies:  Allergies  Allergen Reactions   Vitamin D Analogs Other (See Comments)    "dizziness"    Past Medical History, Surgical history, Social history, and Family History were reviewed and updated.  Review of Systems: Review of Systems  Constitutional: Negative.   HENT:  Negative.    Eyes: Negative.   Respiratory:  Positive for shortness of breath.   Cardiovascular: Negative.   Gastrointestinal: Negative.   Endocrine: Negative.   Genitourinary: Negative.    Musculoskeletal: Negative.   Skin: Negative.   Neurological: Negative.   Psychiatric/Behavioral: Negative.     Physical Exam:  weight is 148 lb (67.1 kg). Her oral temperature is 97.8 F (36.6 C). Her blood pressure is 155/75 (abnormal) and her pulse is 75. Her respiration is 18 and oxygen saturation is 96%.   Wt Readings from Last 3 Encounters:  10/29/21 148 lb (67.1 kg)  10/23/21 146 lb 6.4 oz (66.4 kg)  09/24/21 145 lb 1.9 oz (65.8 kg)    Physical Exam Vitals reviewed.  HENT:     Head: Normocephalic and atraumatic.  Eyes:     Pupils: Pupils are equal, round, and reactive to light.  Cardiovascular:     Rate and  Rhythm: Normal rate and regular rhythm.     Heart sounds: Normal heart sounds.  Pulmonary:     Effort: Pulmonary effort is normal.     Breath sounds: Normal breath sounds.     Comments: Pulmonary exam shows decent breath sounds bilaterally.  She may have some congestion and wheezes bilaterally.  She may have a little bit decreased breath sounds at the right lung base. Abdominal:     General: Bowel sounds are normal.     Palpations: Abdomen is soft.  Musculoskeletal:        General: No tenderness or deformity. Normal range of motion.     Cervical back: Normal range of motion.     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  This is the ring finger.  She has  a amputation down to the proximal interphalangeal joint.  Lymphadenopathy:     Cervical: No cervical adenopathy.  Skin:    General: Skin is warm and dry.     Findings: No erythema or rash.  Neurological:     Mental Status: She is alert and oriented to person, place, and time.  Psychiatric:        Behavior: Behavior normal.        Thought Content: Thought content normal.        Judgment: Judgment normal.     Lab Results  Component Value Date   WBC 8.0 10/29/2021   HGB 12.4 10/29/2021   HCT 38.5 10/29/2021   MCV 93.0 10/29/2021   PLT 257 10/29/2021     Chemistry      Component Value Date/Time   NA 141 10/29/2021 1003   NA 144 07/16/2017 1029   K 4.4 10/29/2021 1003   CL 105 10/29/2021 1003   CO2 29 10/29/2021 1003   BUN 18 10/29/2021 1003   BUN 14 07/16/2017 1029   CREATININE 0.81 10/29/2021 1003      Component Value Date/Time   CALCIUM 9.4 10/29/2021 1003   ALKPHOS 68 10/29/2021 1003   AST 20 10/29/2021 1003   ALT 13 10/29/2021 1003   BILITOT 0.6 10/29/2021 1003      Impression and Plan:  Peggy Howard is a very charming 87 year old white female.  She has recurrent melanoma.  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  Hopefully, the [place] is working.  Hopefully, we will see a response with the PET scan.  Radiation oncology is taking care of the CNS disease.  Sound like she is going to have an MRI.  This might be another couple months.  Again, we will do a PET scan on her.  We will see what the PET scan shows.  Hopefully will see improvement.  I would like to think that we will given that the tumor has a C-kit mutation.  I am just glad that her quality of life is doing well and that she is enjoying herself.    Peggy Napoleon, MD 12/6/202212:26 PM

## 2021-10-29 NOTE — Progress Notes (Signed)
Patient will need a PET scan prior to next follow up appointment. Will follow for PA.   Oncology Nurse Navigator Documentation  Oncology Nurse Navigator Flowsheets 10/29/2021  Planned Course of Treatment -  Phase of Treatment -  Chemotherapy Pending- Reason: -  Chemotherapy Actual Start Date: -  Radiation Actual Start Date: -  Radiation Actual End Date: -  Navigator Follow Up Date: 10/30/2021  Navigator Follow Up Reason: Appointment Review  Navigator Location [facility]  Navigator Encounter Type Appt/Treatment Plan Review  Telephone -  Treatment Initiated Date -  Patient Visit Type MedOnc  Treatment Phase Active Tx  Barriers/Navigation Needs Coordination of Care;Education  Education -  Interventions None Required  Acuity Level 2-Minimal Needs (1-2 Barriers Identified)  Referrals -  Coordination of Care -  Education Method -  Support Groups/Services Friends and Family  Time Spent with Patient 15

## 2021-10-30 ENCOUNTER — Encounter: Payer: Self-pay | Admitting: *Deleted

## 2021-10-30 LAB — T4: T4, Total: 6.6 ug/dL (ref 4.5–12.0)

## 2021-10-30 NOTE — Progress Notes (Signed)
PET Scan scheduled for 11/29/2020. Radiology info sheet sent to patient home with appointment time, date and location along with prep instructions for PET scan.   Oncology Nurse Navigator Documentation  Oncology Nurse Navigator Flowsheets 10/30/2021  Planned Course of Treatment -  Phase of Treatment -  Chemotherapy Pending- Reason: -  Chemotherapy Actual Start Date: -  Radiation Actual Start Date: -  Radiation Actual End Date: -  Navigator Follow Up Date: 11/29/2021  Navigator Follow Up Reason: Scan Review  Navigator Location [facility]  Navigator Encounter Type Appt/Treatment Plan Review  Telephone -  Treatment Initiated Date -  Patient Visit Type MedOnc  Treatment Phase Active Tx  Barriers/Navigation Needs Coordination of Care;Education  Education Other  Interventions Coordination of Care;Education  Acuity Level 2-Minimal Needs (1-2 Barriers Identified)  Referrals -  Coordination of Care Radiology  Education Method Written  Support Groups/Services Friends and Family  Time Spent with Patient 30

## 2021-11-06 NOTE — Progress Notes (Signed)
° °                                                                                                                                                          °  Patient Name: Peggy Howard MRN: [id number] DOB: 1933-09-26 Referring Physician: Burney Gauze (Profile Not Attached) Date of Service: 09/13/2021 [facility] [place]                                                        End Of Treatment Note  Diagnoses: C79.31-Secondary malignant neoplasm of brain  Cancer Staging:  Cancer Staging  Melanoma metastatic to lung [facility]) Staging form: Melanoma of the Skin, AJCC 8th Edition - Clinical stage from 06/21/2021: Stage IV (cTX, cNX, cM1b(1)) - Signed by Volanda Napoleon, MD on 06/21/2021 Stage prefix: Initial diagnosis  Intent: Palliative  Radiation Treatment Dates: 09/13/2021 through 09/13/2021 Site Technique Total Dose (Gy) Dose per Fx (Gy) Completed Fx Beam Energies  Brain: Brain_SRS 3D 20/20 20 1/1 6XFFF  Right frontal lobe metastasis was treated using SRS 3D technique to a dose of 20 Gray in 1 fraction.  6 MV FFF photons were used. ExacTrac Snap verification was performed for each couch angle.  Narrative: The patient tolerated radiation therapy relatively well.   Plan: The patient will follow-up with radiation oncology in 9mo.  -----------------------------------  [name], MD

## 2021-11-21 ENCOUNTER — Other Ambulatory Visit: Payer: Self-pay | Admitting: Radiation Therapy

## 2021-11-21 DIAGNOSIS — C7949 Secondary malignant neoplasm of other parts of nervous system: Secondary | ICD-10-CM

## 2021-11-29 ENCOUNTER — Ambulatory Visit (HOSPITAL_COMMUNITY)
Admission: RE | Admit: 2021-11-29 | Discharge: 2021-11-29 | Disposition: A | Discharge status: Home / Self Care | Payer: Medicare Other | Source: Ambulatory Visit | Attending: Hematology & Oncology | Admitting: Hematology & Oncology

## 2021-11-29 ENCOUNTER — Other Ambulatory Visit: Payer: Self-pay

## 2021-11-29 DIAGNOSIS — K5730 Diverticulosis of large intestine without perforation or abscess without bleeding: Secondary | ICD-10-CM | POA: Diagnosis not present

## 2021-11-29 DIAGNOSIS — K8020 Calculus of gallbladder without cholecystitis without obstruction: Secondary | ICD-10-CM | POA: Insufficient documentation

## 2021-11-29 DIAGNOSIS — C7800 Secondary malignant neoplasm of unspecified lung: Secondary | ICD-10-CM | POA: Insufficient documentation

## 2021-11-29 LAB — GLUCOSE, CAPILLARY: Glucose-Capillary: 132 mg/dL — ABNORMAL HIGH (ref 70–99)

## 2021-11-29 IMAGING — PT NM PET IMAGE RESTAGE (PS) WHOLE BODY
7 series · 25 of 25 positions shown · non-contrast
Comparison: [DATE]

CLINICAL DATA: Subsequent treatment strategy for melanoma.

EXAM:
NUCLEAR MEDICINE PET WHOLE BODY
TECHNIQUE: 6.8 mCi F-18 FDG was injected intravenously. Full-ring PET imaging
was performed from the head to foot after the radiotracer. CT data
was obtained and used for attenuation correction and anatomic
localization.
Fasting blood glucose: 132 mg/dl

[Series 3: pet wb ac · axial · 5.0mm · 4.07mm/px · z∈[+344,+2028]mm · 5 of 422 slices shown]
[im 1/422]
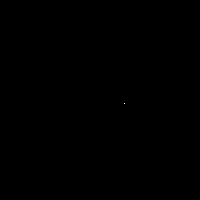
[im 106/422]
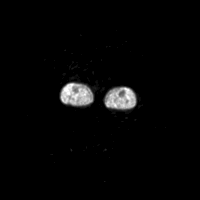
[im 211/422]
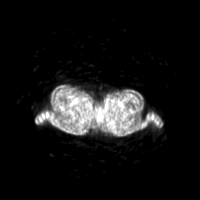
[im 316/422]
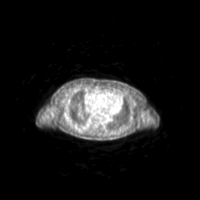
[im 422/422]
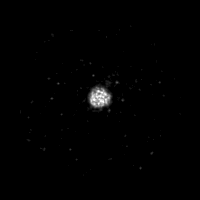

[Series 4: ct wb 5.0 bf37 · axial · 5.0mm · 0.98mm/px · z∈[+344,+2028]mm · 5 of 422 slices shown]
[im 1/422]
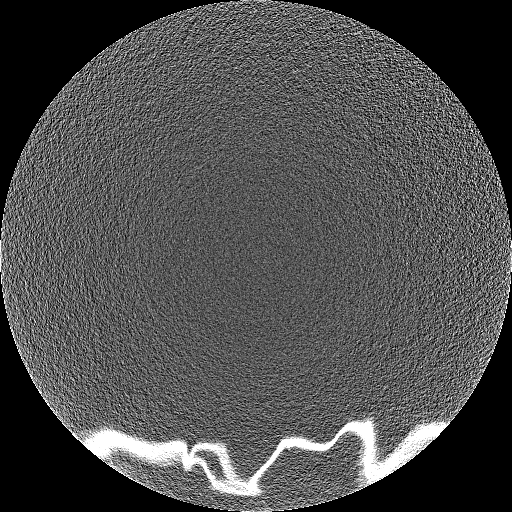
[im 106/422  soft-tissue]
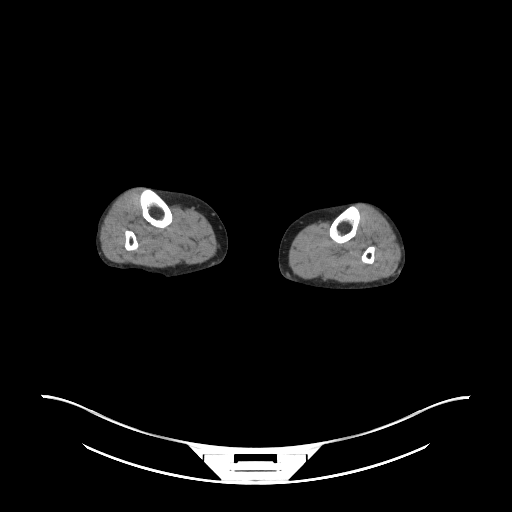
[im 211/422  soft-tissue]
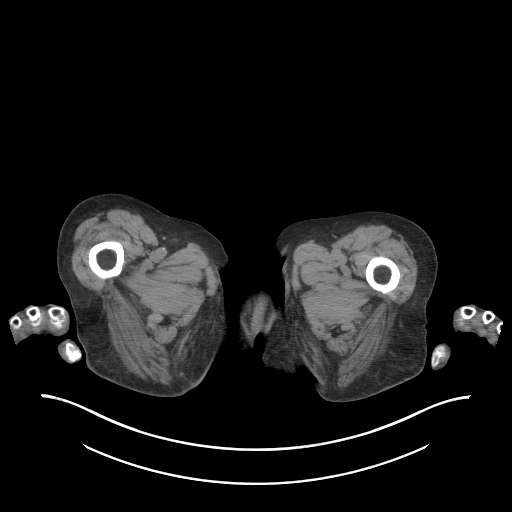
[im 316/422  soft-tissue]
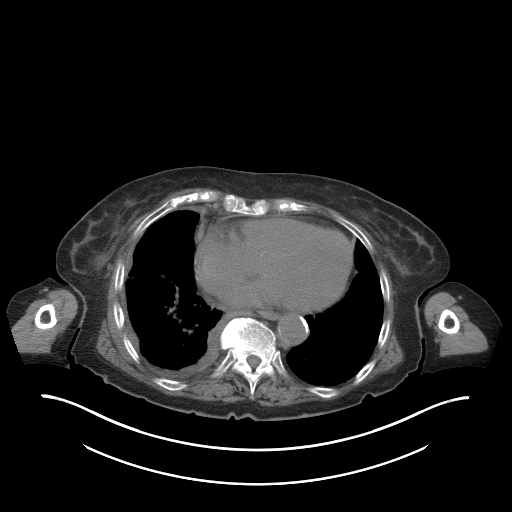
[im 422/422  soft-tissue]
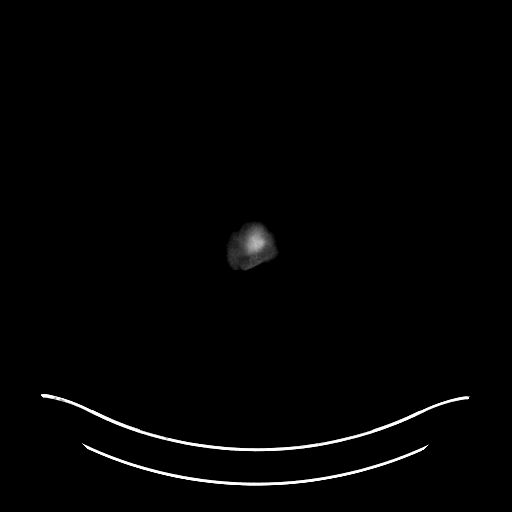

[Series 5: pet wb nac · axial · 5.0mm · 4.07mm/px · z∈[+348,+2028]mm · 6 of 421 slices shown]
[im 1/421  full-range]
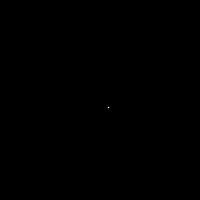
[im 85/421]
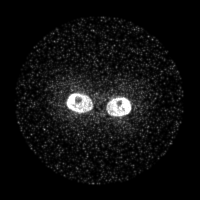
[im 169/421]
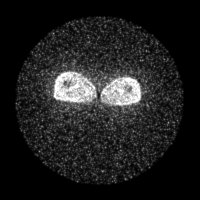
[im 253/421]
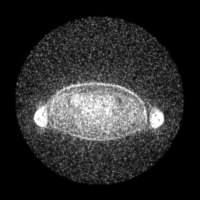
[im 337/421]
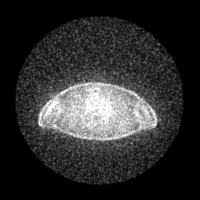
[im 421/421]
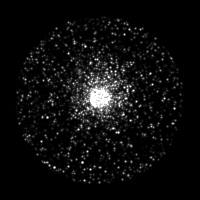

[Series 8: ct wb 5.0 br59 lung_bone · axial · 5.0mm · 0.64mm/px · 1 of 64 slices shown]
[im 1/64]
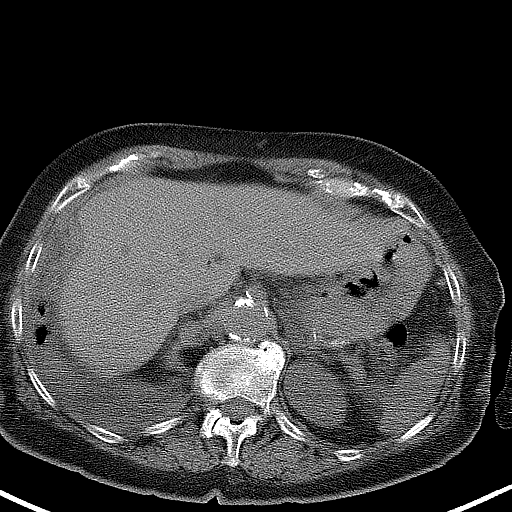

[Series 603: <mip collection> · coronal · 3.49mm/px · 1 of 32 slices shown]
[im 1/32]
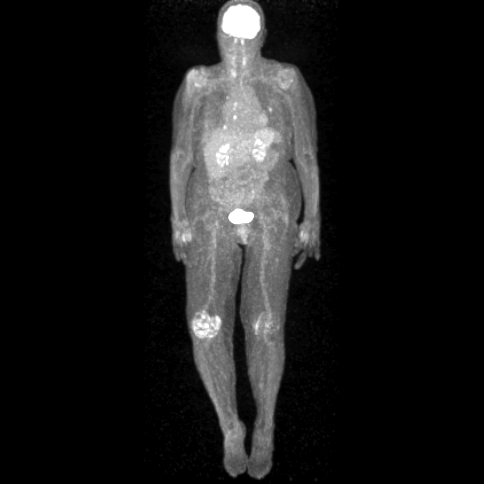

[Series 605: fused cor · 1 of 46 slices shown]
[im 1/46]
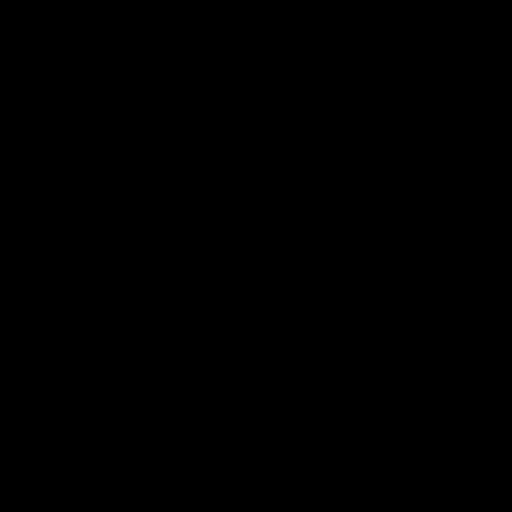

[Series 606: range-ct wb 5.0 bf37-tra-<alpha range> · 6 of 406 slices shown]
[im 1/406]
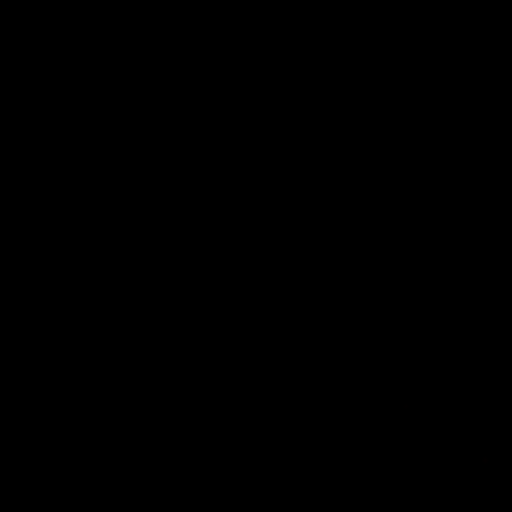
[im 82/406]
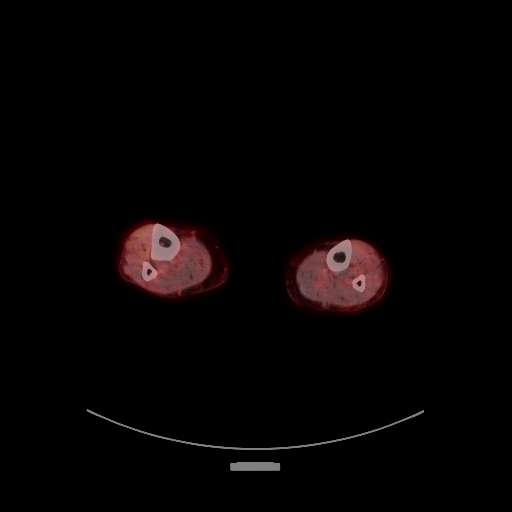
[im 163/406]
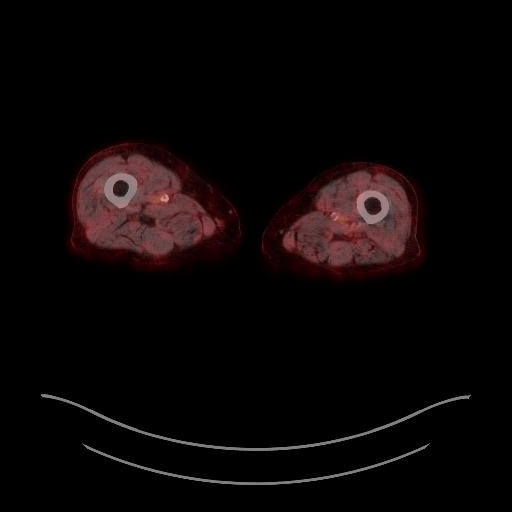
[im 244/406]
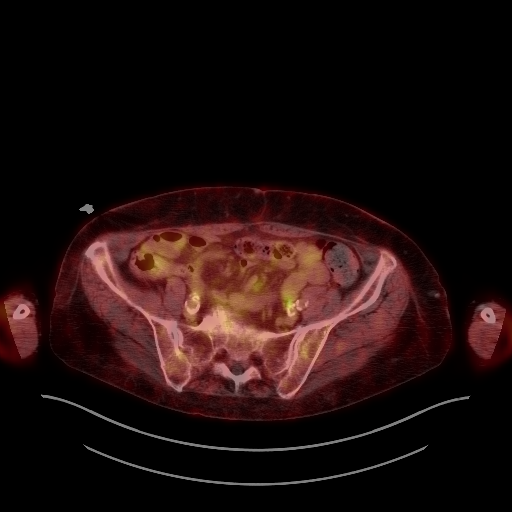
[im 325/406]
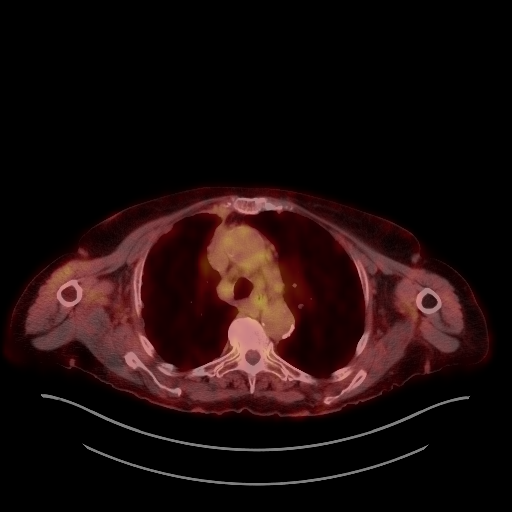
[im 406/406]
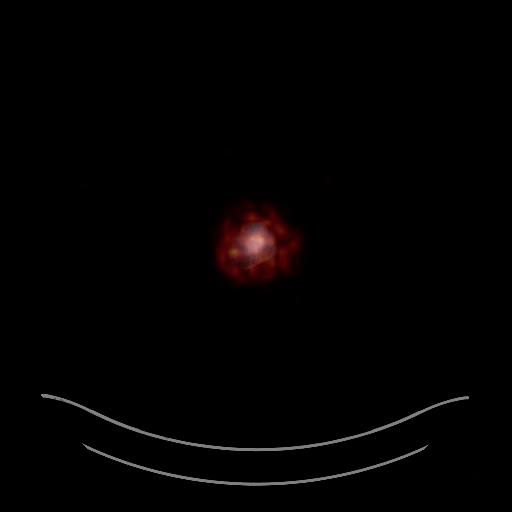

[25 of 25 positions shown; findings below may reference images not displayed]

FINDINGS: Mediastinal blood pool activity: SUV max

HEAD/NECK: No hypermetabolic activity in the scalp. No
hypermetabolic cervical lymph nodes.

Incidental CT findings: none

CHEST: Marked interval improvement in the diffuse nodular
hypermetabolism associated with the pleura of the right hemithorax.
Index medial right apical nodule measured previously at 3.2 x 2.3 cm
is now 0.9 x 0.8 cm on 74/4 with SUV max = 1.9, decreased from
previously.

Index right middle lobe pulmonary nodule measured previously at 9 mm
is now 4 mm when measured in the same dimension. SUV max = 0.6 today
compared to 2.6 previously.

Subpleural nodule posterior left lower lobe measured previously at
1.6 x 1.0 cm is now 1.8 x 0.9 cm. SUV max = 4.0 today compared to
1.6 previously.

No new or progressive hypermetabolic disease in the chest.

Incidental CT findings: Heart is enlarged. Coronary artery
calcification is evident. Moderate atherosclerotic calcification is
noted in the wall of the thoracic aorta.

ABDOMEN/PELVIS: No abnormal hypermetabolic activity within the
liver, pancreas, adrenal glands, or spleen. No hypermetabolic lymph
nodes in the abdomen or pelvis.

Incidental CT findings: Tiny calcified gallstones again noted. There
is advanced atherosclerotic calcification of the abdominal aorta
without aneurysm. Left colonic diverticulosis without
diverticulitis.

SKELETON: No focal hypermetabolic activity to suggest skeletal
metastasis.

Incidental CT findings: none

EXTREMITIES: No abnormal unexpected hypermetabolic activity in the
lower extremities.

Incidental CT findings: none
IMPRESSION: 1. Overall generalized improvement in the nodular circumferential
pleural thickening of the right hemithorax with decreased soft
tissue bulk on CT imaging and generalized decreased FDG accumulation
on PET imaging.
2. Subpleural posterior left lower lobe pulmonary nodule is similar
in size today but shows subtle increase in hypermetabolism. Close
continued attention on follow-up recommended.
3. Cholelithiasis.
4. Left colonic diverticulosis without diverticulitis.
5.  Aortic Atherosclerois ([3K]-170.0)

## 2021-11-29 MED ORDER — FLUDEOXYGLUCOSE F - 18 (FDG) INJECTION
6.7600 | Freq: Once | INTRAVENOUS | Status: AC | PRN
  Administered 2021-11-29: 6.76 via INTRAVENOUS

## 2021-12-02 ENCOUNTER — Encounter: Payer: Self-pay | Admitting: *Deleted

## 2021-12-03 ENCOUNTER — Encounter: Payer: Self-pay | Admitting: *Deleted

## 2021-12-03 NOTE — Progress Notes (Signed)
Oncology Nurse Navigator Documentation  Oncology Nurse Navigator Flowsheets 12/03/2021  Planned Course of Treatment -  Phase of Treatment -  Chemotherapy Pending- Reason: -  Chemotherapy Actual Start Date: -  Radiation Actual Start Date: -  Radiation Actual End Date: -  Navigator Follow Up Date: 12/09/2021  Navigator Follow Up Reason: Follow-up Appointment  Navigator Location [facility]  Navigator Encounter Type Scan Review  Telephone -  Treatment Initiated Date -  Patient Visit Type MedOnc  Treatment Phase Active Tx  Barriers/Navigation Needs Coordination of Care;Education  Education -  Interventions None Required  Acuity Level 2-Minimal Needs (1-2 Barriers Identified)  Referrals -  Coordination of Care -  Education Method -  Support Groups/Services Friends and Family  Time Spent with Patient 15

## 2021-12-09 ENCOUNTER — Inpatient Hospital Stay: Payer: Medicare Other | Attending: Hematology & Oncology

## 2021-12-09 ENCOUNTER — Telehealth: Payer: Self-pay

## 2021-12-09 ENCOUNTER — Other Ambulatory Visit: Payer: Self-pay | Admitting: Hematology & Oncology

## 2021-12-09 ENCOUNTER — Inpatient Hospital Stay (HOSPITAL_BASED_OUTPATIENT_CLINIC_OR_DEPARTMENT_OTHER): Payer: Medicare Other | Admitting: Hematology & Oncology

## 2021-12-09 ENCOUNTER — Encounter: Payer: Self-pay | Admitting: *Deleted

## 2021-12-09 ENCOUNTER — Other Ambulatory Visit: Payer: Self-pay

## 2021-12-09 ENCOUNTER — Encounter: Payer: Self-pay | Admitting: Hematology & Oncology

## 2021-12-09 VITALS — BP 133/62 | HR 64 | Temp 97.9°F | Resp 18 | Ht 64.0 in | Wt 151.0 lb

## 2021-12-09 DIAGNOSIS — E039 Hypothyroidism, unspecified: Secondary | ICD-10-CM | POA: Diagnosis not present

## 2021-12-09 DIAGNOSIS — C4362 Malignant melanoma of left upper limb, including shoulder: Secondary | ICD-10-CM | POA: Insufficient documentation

## 2021-12-09 DIAGNOSIS — C7931 Secondary malignant neoplasm of brain: Secondary | ICD-10-CM | POA: Insufficient documentation

## 2021-12-09 DIAGNOSIS — C7800 Secondary malignant neoplasm of unspecified lung: Secondary | ICD-10-CM

## 2021-12-09 DIAGNOSIS — Z79899 Other long term (current) drug therapy: Secondary | ICD-10-CM | POA: Diagnosis not present

## 2021-12-09 DIAGNOSIS — J90 Pleural effusion, not elsewhere classified: Secondary | ICD-10-CM | POA: Insufficient documentation

## 2021-12-09 DIAGNOSIS — C7801 Secondary malignant neoplasm of right lung: Secondary | ICD-10-CM

## 2021-12-09 LAB — TSH: TSH: 9.627 u[IU]/mL — ABNORMAL HIGH (ref 0.308–3.960)

## 2021-12-09 LAB — CBC WITH DIFFERENTIAL (CANCER CENTER ONLY)
Abs Immature Granulocytes: 0.04 10*3/uL (ref 0.00–0.07)
Basophils Absolute: 0.1 10*3/uL (ref 0.0–0.1)
Basophils Relative: 1 %
Eosinophils Absolute: 0.1 10*3/uL (ref 0.0–0.5)
Eosinophils Relative: 2 %
HCT: 37.9 % (ref 36.0–46.0)
Hemoglobin: 12.5 g/dL (ref 12.0–15.0)
Immature Granulocytes: 1 %
Lymphocytes Relative: 23 %
Lymphs Abs: 1.7 10*3/uL (ref 0.7–4.0)
MCH: 32.6 pg (ref 26.0–34.0)
MCHC: 33 g/dL (ref 30.0–36.0)
MCV: 98.7 fL (ref 80.0–100.0)
Monocytes Absolute: 0.4 10*3/uL (ref 0.1–1.0)
Monocytes Relative: 5 %
Neutro Abs: 5.2 10*3/uL (ref 1.7–7.7)
Neutrophils Relative %: 68 %
Platelet Count: 263 10*3/uL (ref 150–400)
RBC: 3.84 MIL/uL — ABNORMAL LOW (ref 3.87–5.11)
RDW: 15.3 % (ref 11.5–15.5)
WBC Count: 7.5 10*3/uL (ref 4.0–10.5)
nRBC: 0 % (ref 0.0–0.2)

## 2021-12-09 LAB — CMP (CANCER CENTER ONLY)
ALT: 13 U/L (ref 0–44)
AST: 22 U/L (ref 15–41)
Albumin: 4.1 g/dL (ref 3.5–5.0)
Alkaline Phosphatase: 72 U/L (ref 38–126)
Anion gap: 8 (ref 5–15)
BUN: 24 mg/dL — ABNORMAL HIGH (ref 8–23)
CO2: 31 mmol/L (ref 22–32)
Calcium: 9.2 mg/dL (ref 8.9–10.3)
Chloride: 103 mmol/L (ref 98–111)
Creatinine: 0.9 mg/dL (ref 0.44–1.00)
GFR, Estimated: >60 mL/min (ref >60)
Glucose, Bld: 127 mg/dL — ABNORMAL HIGH (ref 70–99)
Potassium: 4.4 mmol/L (ref 3.5–5.1)
Sodium: 142 mmol/L (ref 135–145)
Total Bilirubin: 0.7 mg/dL (ref 0.3–1.2)
Total Protein: 6.4 g/dL — ABNORMAL LOW (ref 6.5–8.1)

## 2021-12-09 LAB — LACTATE DEHYDROGENASE: LDH: 238 U/L — ABNORMAL HIGH (ref 98–192)

## 2021-12-09 MED ORDER — LEVOTHYROXINE SODIUM 50 MCG PO TABS: 50.0000 ug | ORAL_TABLET | Freq: Every day | ORAL | 0 refills | Status: DC

## 2021-12-09 NOTE — Progress Notes (Signed)
Oncology Nurse Navigator Documentation  Oncology Nurse Navigator Flowsheets 12/09/2021  Planned Course of Treatment -  Phase of Treatment -  Chemotherapy Pending- Reason: -  Chemotherapy Actual Start Date: -  Radiation Actual Start Date: -  Radiation Actual End Date: -  Navigator Follow Up Date: 01/27/2022  Navigator Follow Up Reason: Follow-up Appointment  Navigator Location [facility]  Navigator Encounter Type Follow-up Appt;Appt/Treatment Plan Review  Telephone -  Treatment Initiated Date -  Patient Visit Type MedOnc  Treatment Phase Active Tx  Barriers/Navigation Needs Coordination of Care;Education  Education -  Interventions Psycho-Social Support  Acuity Level 2-Minimal Needs (1-2 Barriers Identified)  Referrals -  Coordination of Care -  Education Method -  Support Groups/Services Friends and Family  Time Spent with Patient 15

## 2021-12-09 NOTE — Addendum Note (Signed)
Addended by: Burney Gauze R on: 12/09/2021 02:52 PM   Modules accepted: Orders

## 2021-12-09 NOTE — Progress Notes (Signed)
Hematology and Oncology Follow Up Visit  Peggy Howard [id number] 09-12-1933 88 y.o. 12/09/2021   Principle Diagnosis:  Recurrent melanoma-right pleural effusion  -- BRAFwt/ c-Kit (+)  Current Therapy:   Keytruda 200 mg IV q 3 week -- s/p cycle #1 -- d/c on 08/14/2021 for progression SBRT to CNS metastasis on 09/13/2021 Gleevec 400 mg po q day -- start on 08/19/2021     Interim History:  Peggy Howard is back for follow-up.  She had a PET scan that was done last week.  Thankfully, the PET scan showed that she has had a very nice response.  She had marked interval improvement in the nodular tumor with the pleura of the right hemithorax.  There was no pleural effusion.  There is no areas of progressive disease.  The nodule that she did have measured 3.2 x 2.3 cm.  Now measures 0.9 x 0.8 cm.  I am just very happy for her.  She is not wearing oxygen.  She is very active.  She had a wonderful holiday season with her family.  She has had no issues with the Gleevec.  She may have little bit of swelling in the legs but not too bad.  There is no diarrhea.  She has had no cough or shortness of breath.  She has had no headache.  She has had no nausea or vomiting.  Overall, her performance status is ECOG 1.    Medications:  Current Outpatient Medications:    acetaminophen (TYLENOL) 500 MG tablet, Take 1,000 mg by mouth every 6 (six) hours as needed for mild pain, fever or headache., Disp: , Rfl:    albuterol (VENTOLIN HFA) 108 (90 Base) MCG/ACT inhaler, Inhale 2 puffs into the lungs every 6 (six) hours as needed for wheezing or shortness of breath., Disp: 8 g, Rfl: 2   ferrous sulfate 325 (65 FE) MG tablet, Take 325 mg by mouth daily with breakfast., Disp: , Rfl:    fluticasone-salmeterol (ADVAIR DISKUS) 250-50 MCG/ACT AEPB, Inhale 1 puff into the lungs in the morning and at bedtime., Disp: 1 each, Rfl: 1   imatinib (GLEEVEC) 400 MG tablet, Take 1 tablet (400 mg total) by mouth daily. Take with  meals and large glass of water.Caution:Chemotherapy., Disp: 30 tablet, Rfl: 5   Multiple Vitamins-Minerals (PRESERVISION AREDS 2 PO), Take 1 tablet by mouth 2 (two) times daily., Disp: , Rfl:   Allergies:  Allergies  Allergen Reactions   Vitamin D Analogs Other (See Comments)    "dizziness"    Past Medical History, Surgical history, Social history, and Family History were reviewed and updated.  Review of Systems: Review of Systems  Constitutional: Negative.   HENT:  Negative.    Eyes: Negative.   Respiratory:  Positive for shortness of breath.   Cardiovascular: Negative.   Gastrointestinal: Negative.   Endocrine: Negative.   Genitourinary: Negative.    Musculoskeletal: Negative.   Skin: Negative.   Neurological: Negative.   Psychiatric/Behavioral: Negative.     Physical Exam:  height is _0  (1.626 m) and weight is 151 lb 0.6 oz (68.5 kg). Her oral temperature is 97.9 F (36.6 C). Her blood pressure is 133/62 and her pulse is 64. Her respiration is 18 and oxygen saturation is 98%.   Wt Readings from Last 3 Encounters:  12/09/21 151 lb 0.6 oz (68.5 kg)  10/29/21 148 lb (67.1 kg)  10/23/21 146 lb 6.4 oz (66.4 kg)    Physical Exam Vitals reviewed.  HENT:  Head: Normocephalic and atraumatic.  Eyes:     Pupils: Pupils are equal, round, and reactive to light.  Cardiovascular:     Rate and Rhythm: Normal rate and regular rhythm.     Heart sounds: Normal heart sounds.  Pulmonary:     Effort: Pulmonary effort is normal.     Breath sounds: Normal breath sounds.     Comments: Pulmonary exam shows decent breath sounds bilaterally.  She may have some congestion and wheezes bilaterally.  She may have a little bit decreased breath sounds at the right lung base. Abdominal:     General: Bowel sounds are normal.     Palpations: Abdomen is soft.  Musculoskeletal:        General: No tenderness or deformity. Normal range of motion.     Cervical back: Normal range of motion.      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  This is the ring finger.  She has a amputation down to the proximal interphalangeal joint.  Lymphadenopathy:     Cervical: No cervical adenopathy.  Skin:    General: Skin is warm and dry.     Findings: No erythema or rash.  Neurological:     Mental Status: She is alert and oriented to person, place, and time.  Psychiatric:        Behavior: Behavior normal.        Thought Content: Thought content normal.        Judgment: Judgment normal.     Lab Results  Component Value Date   WBC 7.5 12/09/2021   HGB 12.5 12/09/2021   HCT 37.9 12/09/2021   MCV 98.7 12/09/2021   PLT 263 12/09/2021     Chemistry      Component Value Date/Time   NA 142 12/09/2021 0954   NA 144 07/16/2017 1029   K 4.4 12/09/2021 0954   CL 103 12/09/2021 0954   CO2 31 12/09/2021 0954   BUN 24 (H) 12/09/2021 0954   BUN 14 07/16/2017 1029   CREATININE 0.90 12/09/2021 0954      Component Value Date/Time   CALCIUM 9.2 12/09/2021 0954   ALKPHOS 72 12/09/2021 0954   AST 22 12/09/2021 0954   ALT 13 12/09/2021 0954   BILITOT 0.7 12/09/2021 0954      Impression and Plan:  Peggy Howard is a very charming 88 year old white female.  She has recurrent melanoma.  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  I am so happy that the [place] is working.  The PET scan clearly shows this.  Her quality of life is so much better.  I do not think we have to do another PET scan probably until the Spring.  We will go ahead and plan for a follow-up in early March.  Of note, she did receive SRS for the brain mets.  I think this was done in October.  I know that Radiation Oncology is managing this and following this with an MRI.    Volanda Napoleon, MD 1/16/202311:48 AM

## 2021-12-09 NOTE — Telephone Encounter (Signed)
Called pt son, [name] to inform him to have his mom stop taking her oral iron. Also informed pt son to have his mom start taking Synthroid 50 mcg once daily before breakfast. Pt son verbalized understanding and had no further questions. Pt and son aware to call with any questions or concerns. [name] aware that synthroid medication will be sent to [facility] on [facility] [address] [facility] [facility].

## 2021-12-10 LAB — T4: T4, Total: 4.6 ug/dL (ref 4.5–12.0)

## 2021-12-20 ENCOUNTER — Ambulatory Visit
Admission: RE | Admit: 2021-12-20 | Discharge: 2021-12-20 | Disposition: A | Discharge status: Home / Self Care | Payer: Medicare Other | Source: Ambulatory Visit | Attending: Radiation Oncology | Admitting: Radiation Oncology

## 2021-12-20 ENCOUNTER — Other Ambulatory Visit: Payer: Self-pay

## 2021-12-20 DIAGNOSIS — C7949 Secondary malignant neoplasm of other parts of nervous system: Secondary | ICD-10-CM

## 2021-12-20 DIAGNOSIS — C7931 Secondary malignant neoplasm of brain: Secondary | ICD-10-CM

## 2021-12-20 IMAGING — MR MR HEAD WO/W CM
15 series · 48 of 48 positions shown · IV contrast (multihance)
Comparison: [DATE]

CLINICAL DATA: Metastatic melanoma.

EXAM:
MRI HEAD WITHOUT AND WITH CONTRAST
TECHNIQUE: Multiplanar, multiecho pulse sequences of the brain and surrounding
structures were obtained without and with intravenous contrast.
CONTRAST:  15mL MULTIHANCE GADOBENATE DIMEGLUMINE 529 MG/ML IV SOLN

[Series 5: T1 · sagittal · 4.0mm · 0.75mm/px · 2 of 31 slices shown (1 of 3)]
[im 1/31]
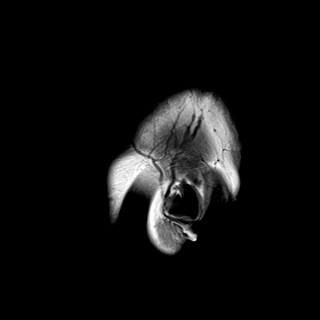
[im 31/31]
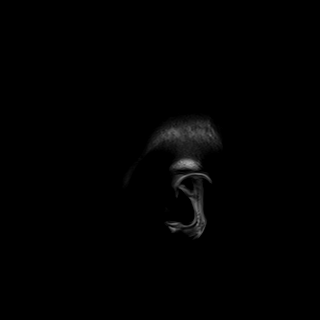

[Series 6: DWI · axial · 3.0mm · 0.94mm/px · z∈[-42,+98]mm · 8 of 160 slices shown (1 of 3)]
[im 1/160]
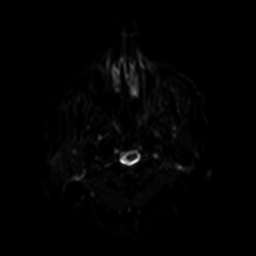
[im 23/160]
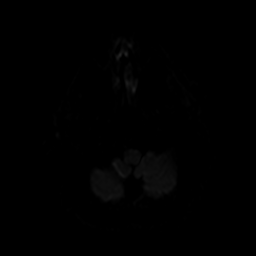
[im 46/160]
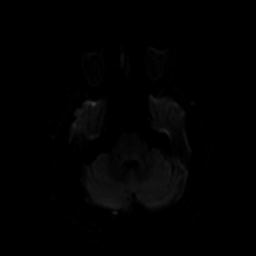
[im 69/160]
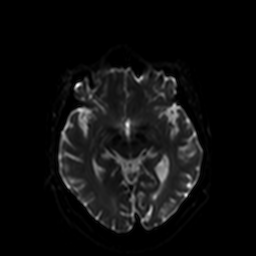
[im 91/160]
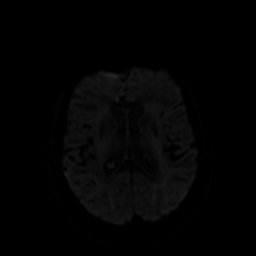
[im 114/160]
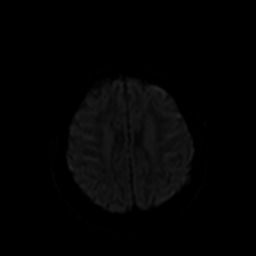
[im 137/160]
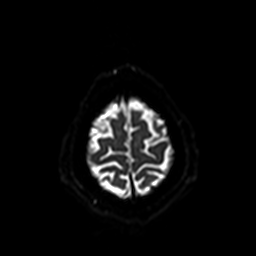
[im 160/160]
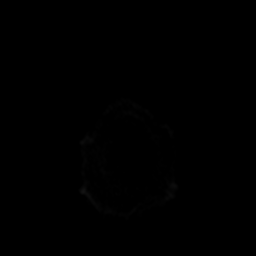

[Series 7: ax dwi_tracew · axial · 3.0mm · 0.94mm/px · z∈[-42,+98]mm · 5 of 80 slices shown]
[im 1/80]
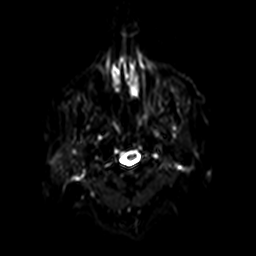
[im 20/80]
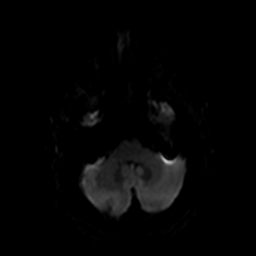
[im 40/80]
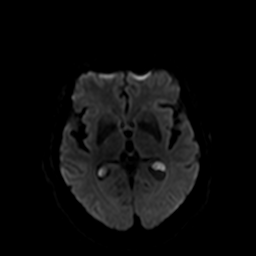
[im 60/80]
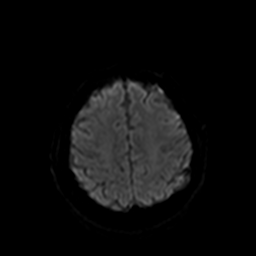
[im 80/80]
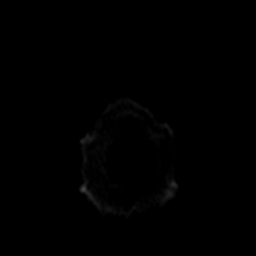

[Series 8: ax dwi_adc · axial · 3.0mm · 0.94mm/px · z∈[-42,+98]mm · 2 of 39 slices shown]
[im 1/39]
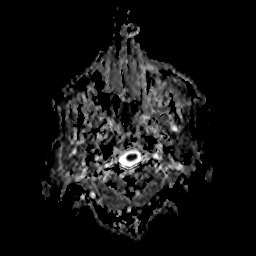
[im 39/39]
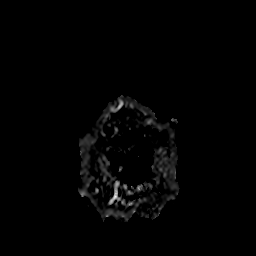

[Series 9: DWI · coronal · 5.0mm · 1.44mm/px · 3 of 60 slices shown (2 of 3)]
[im 1/60]
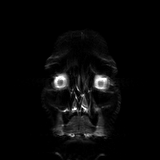
[im 30/60]
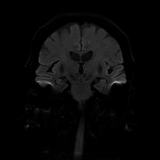
[im 60/60]
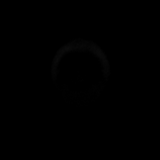

[Series 10: DWI · coronal · 5.0mm · 1.44mm/px · 1 of 30 slices shown (3 of 3)]
[im 1/30]
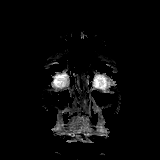

[Series 11: T2 · axial · 4.0mm · 0.36mm/px · 1 of 27 slices shown]
[im 1/27]
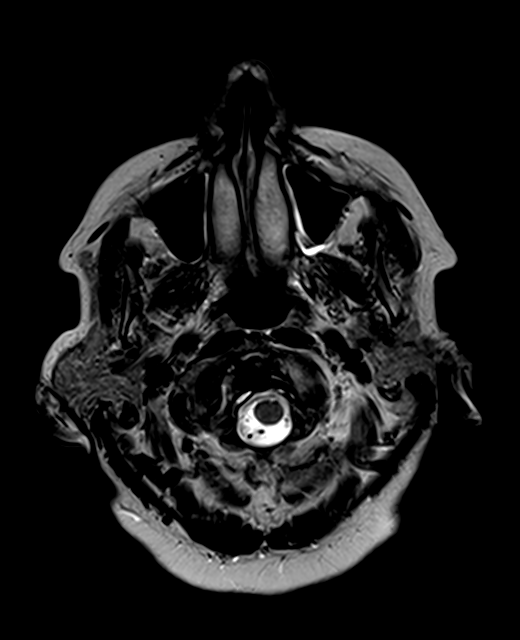

[Series 12: FLAIR · axial · 3.0mm · 0.72mm/px · 1 of 26 slices shown]
[im 1/26]
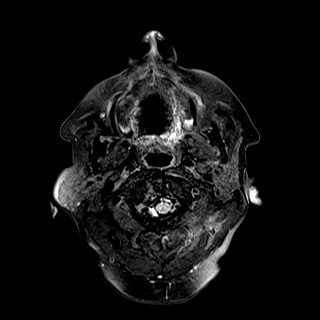

[Series 13: swi_images · axial · 1.5mm · 0.90mm/px · z∈[-43,+99]mm · 4 of 96 slices shown]
[im 1/96]
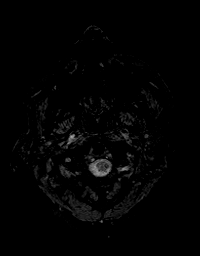
[im 32/96]
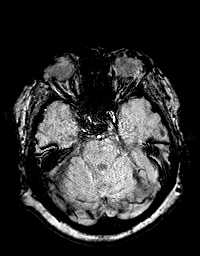
[im 64/96]
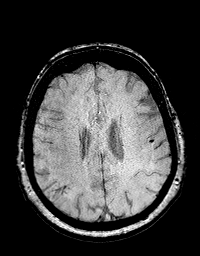
[im 96/96]
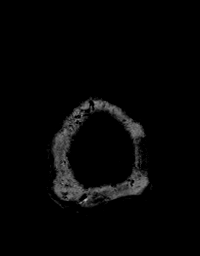

[Series 14: mip_images(sw) · axial · 12.0mm · 0.90mm/px · z∈[-38,+93]mm · 4 of 89 slices shown]
[im 1/89]
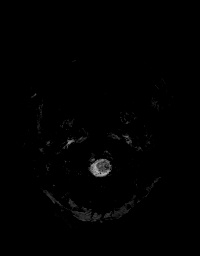
[im 30/89]
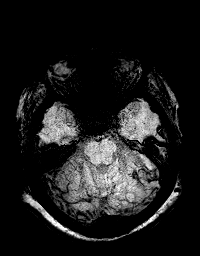
[im 59/89]
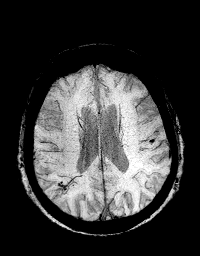
[im 89/89]
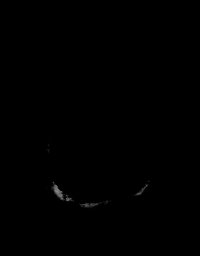

[Series 15: T1 · axial · 1.0mm · 0.94mm/px · z∈[-51,+107]mm · 7 of 160 slices shown (2 of 3)]
[im 1/160]
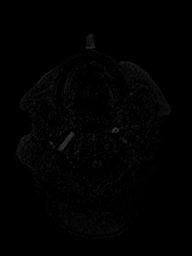
[im 27/160]
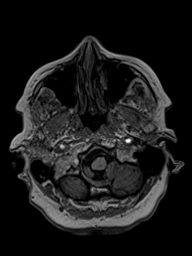
[im 54/160]
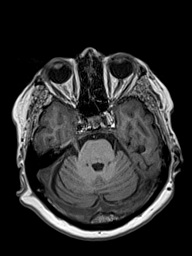
[im 80/160]
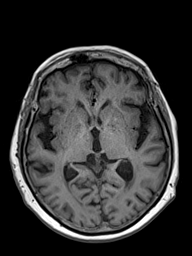
[im 107/160]
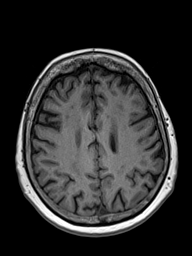
[im 133/160]
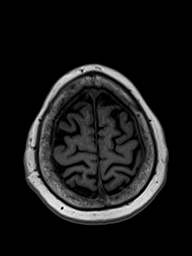
[im 160/160]
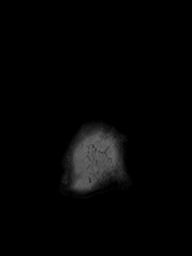

[Series 16: T2 post-contrast · coronal · 4.0mm · 0.36mm/px · 1 of 32 slices shown]
[im 1/32]
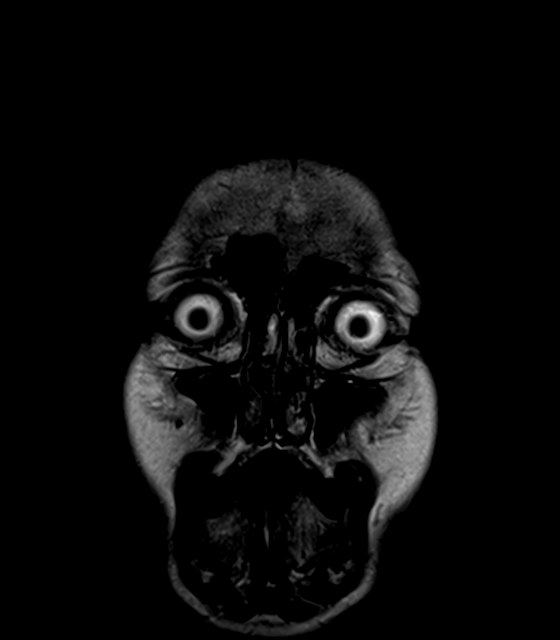

[Series 17: T1 · axial · 1.0mm · 0.94mm/px · z∈[-51,+107]mm · 7 of 160 slices shown (3 of 3)]
[im 1/160]
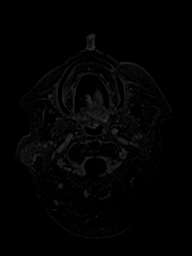
[im 27/160]
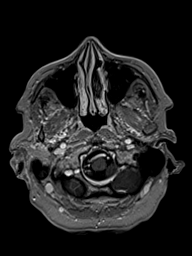
[im 54/160]
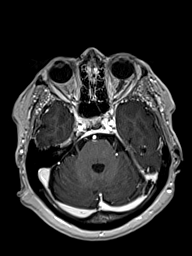
[im 80/160]
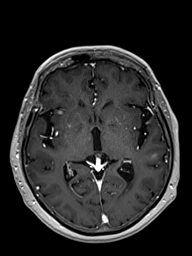
[im 107/160]
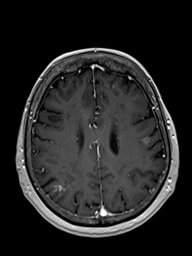
[im 133/160]
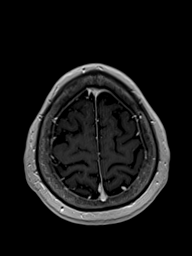
[im 160/160]
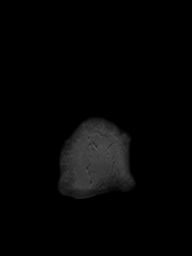

[Series 18: T1 post-contrast · coronal · 4.0mm · 0.72mm/px · 1 of 32 slices shown (1 of 2)]
[im 1/32]
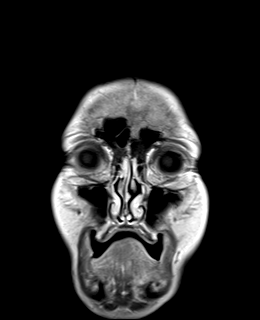

[Series 19: T1 post-contrast · sagittal · 4.0mm · 0.75mm/px · 1 of 31 slices shown (2 of 2)]
[im 1/31]
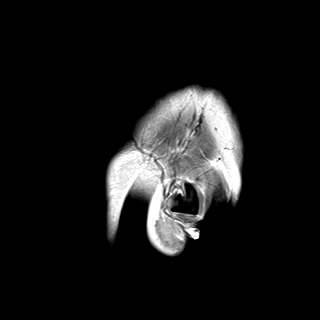

[48 of 48 positions shown; findings below may reference images not displayed]

FINDINGS: Brain: A 5 mm enhancing lesion inferiorly in the right frontal lobe
is unchanged in size, however there is new mild surrounding edema
(series 17, image 69). There are at least 21 new enhancing lesions
involving both cerebral hemispheres with many having associated
chronic blood products. These measure up to 8 mm in size and
demonstrate at most mild edema. There is no mass effect. No
enhancing lesions are identified in the posterior fossa. There is no
evidence of an acute infarct, midline shift, or extra-axial fluid
collection. Small T2 hyperintensities in the cerebral white matter
bilaterally are similar to the prior study are nonspecific but
compatible with moderate chronic small vessel ischemic disease. Mild
cerebral atrophy is within normal limits for age. A right parietal
developmental venous anomaly is again noted.

Vascular: Major intracranial vascular flow voids are preserved.

Skull and upper cervical spine: Unchanged benign-appearing lesion in
the left parietal bone.

Sinuses/Orbits: Unremarkable orbits. Minimal mucosal thickening in
the left maxillary sinus. Clear mastoid air cells.

Other: None.
IMPRESSION: 1. At least 21 new subcentimeter brain metastases with up to mild
edema.
2. Moderate chronic small vessel ischemic disease.

## 2021-12-20 MED ORDER — GADOBENATE DIMEGLUMINE 529 MG/ML IV SOLN
15.0000 mL | Freq: Once | INTRAVENOUS | Status: AC | PRN
  Administered 2021-12-20: 15 mL via INTRAVENOUS

## 2021-12-23 ENCOUNTER — Inpatient Hospital Stay: Payer: Medicare Other

## 2021-12-23 NOTE — Progress Notes (Signed)
Ms. Eubanks presents today for reconsult to discuss pursuing whole brain radiation (hippocampal sparing) based on findings from most recent MRI scan  Patient presented with symptoms of:   MRI Brain w/ & w/o Contrast 12/20/2021 --IMPRESSION: At least 21 new subcentimeter brain metastases with up to mild edema. Moderate chronic small vessel ischemic disease.  Recent neurologic symptoms, if any:  Seizures: None Headaches: Patient denies Nausea: Patient denies Dizziness/ataxia: Patient denies Difficulty with hand coordination: Patient denies Focal numbness/weakness: Patient denies Visual deficits/changes: Reports occasional blurry vision to her right eye. States it eventually resolves on its own Confusion/Memory deficits: Patient denies  Painful bone metastases at present, if any: N/A  SAFETY ISSUES: Prior radiation? Yes: 09/13/2021  Site Technique Total Dose (Gy) Dose per Fx (Gy) Completed Fx Beam Energies  Brain: Brain_SRS 3D 20/20 20 1/1 [id number]   Pacemaker/ICD? No Possible current pregnancy? No--hysterectomy  Is the patient on methotrexate? No  Additional Complaints / other details: Patient and her son report patient recently had to stop taking her levothyroxine due to diarrhea and bloody stools (state Dr. Marin Olp is aware)

## 2021-12-23 NOTE — Progress Notes (Signed)
Radiation Oncology         [phone] ________________________________  Outpatient Re-Consultation by telephone.  The patient opted for telemedicine to maximize safety during the pandemic.  MyChart video was not obtainable.  Name: Peggy Howard MRN: [id number]  Date: 12/24/2021  DOB: 1933/02/15  CC:Pcp, No  Marin Olp Rudell Cobb, MD   REFERRING PHYSICIAN: Volanda Napoleon, MD  DIAGNOSIS:    ICD-10-CM   1. Brain metastases ([place])  C79.31     2. Brain metastasis ([facility])  C79.31      Numerous new brain metastases   Cancer Staging  Melanoma metastatic to lung [facility]) Staging form: Melanoma of the Skin, AJCC 8th Edition - Clinical stage from 06/21/2021: Stage IV (cTX, cNX, cM1b(1)) - Signed by Volanda Napoleon, MD on 06/21/2021 Stage prefix: Initial diagnosis  Metastatic melanoma to lung, with brain mets (STAGE IV)  Prior radiation:  Intent: Palliative Radiation Treatment Dates: 09/13/2021 through 09/13/2021 Site Technique Total Dose (Gy) Dose per Fx (Gy) Completed Fx Beam Energies  Brain: Brain_SRS 3D 20/20 20 1/1 6XFFF   CHIEF COMPLAINT: Here to discuss pursuing whole brain radiation (hippocampal sparing) based on findings from most recent MRI scan  Narrative/Interval History: The patient returns today to for re-evaluation and discussion of whole brain radiation therapy (hippocampal sparing) in management of numerous new brain metastases. I last met with the patient for follow up in November 2022 following brain SRS. Her history since then is as follows.   PET on 11/29/21 showed overall generalized improvement in the nodular circumferential pleural thickening of the right hemithorax, defined by decreased soft tissue bulk on CT imaging, and generalized decreased FDG accumulation on PET imaging. Subpleural posterior left lower lobe pulmonary nodule was appreciated as similar in size, but showed a subtle increase in hypermetabolism.  Most recent brain MRI on 12/20/21 unfortunately  demonstrated at least 21 new subcentimeter brain metastases and mild edema. The various lesions were seen to involve both cerebral hemispheres, and with many having associated chronic blood products. Metastases were appreciated to measure up to 8 mm in size, and demonstrate at most mild edema. I personally reviewed her MRI with the CNS tumor board this week.   Recent neurologic symptoms, if any:  Seizures: None Headaches: Patient denies Nausea: Patient denies Dizziness/ataxia: Patient denies Difficulty with hand coordination: Patient denies Focal numbness/weakness: Patient denies Visual deficits/changes: Reports occasional blurry vision to her right eye. States it eventually resolves on its own Confusion/Memory deficits: Patient denies   Per her most recent follow up with Dr. Marin Olp on 12/09/21, the patient was noted to be doing very well, and denied as respiratory symptoms. She started Gleevec this past September and has had no issues with this other than minor leg swelling.   Current Therapy:        Gleevec 400 mg po q day -- start on 08/19/2021  HISTORY OF PRESENT ILLNESS::Peggy Howard is a 88 y.o. female who was admitted to the hospital on 06/12/21 with acute SOB following rectopexy procedure, and was incidentally found to have several right-sided pulmonary nodules concerning for metastatic disease on chest CTA on 06/14/21. Chest x-ray taken showed some consolidation and a large pleural effusion in the right lower lung. The patient was accordingly admitted and underwent work-up consisting of a CT scan of the head, abdomen and pelvis on 06/15/21 which showed identical findings to CT on 07/22 and no evidence of intracranial metastasis, and right pleural fluid thoracentesis on 06/14/21 which showed malignant cells present. Thoracentesis  seemed to resolve her symptoms (1.4 L of bloody fluid was drained) ,she was discharged home on the 25th and instructed to follow-up with oncology.    Accordingly, the patient met with Dr. Marin Olp on 06/21/21. (Dr. Marin Olp had seen the patient while she was admitted, and had seen her in 2021 for melanoma on her left hand/ 4th digit, which was amputated). Per Dr. Dicie Beam visit note, he was surprised that the melanoma recurred this way, and ordered a PET to determine the extent of disease, as well as an additional chest x-ray. Chest x-ray performed during this visit showed right middle lobe consolidation and some pleural fluid at the right lateral base.  Unfortunately, the patient presented to the ED on 06/27/21 with worsening SOB and was again admitted. Chest x-ray showed worsening/ recurrent right pleural effusion with persistent right basilar airspace disease.  PCCM was then consulted and she had a Pleurx cath placed on 08/04.  About 1 L of blood-tinged pleural fluid was drained right away. Other treatment consisted of IV ceftriaxone, azithromycin and prednisone. Patient was noted to desat with ambulation and required 2 L supplemental O2 to maintain O2 saturation above 88%. The patient was discharged on 2 L on 06/29/21 and with pleurx cath, and arranged with outpatient follow-up. (Discharge Dx was hypoxemic respiratory failure likely due to malignant pleural effusion from malignant melanoma).  PET on 07/05/21 demonstrated extensive hypermetabolic pleural nodularity in the right hemithorax with some hypermetabolic mediastinal lymphadenopathy. No evidence for hypermetabolic left-sided pleural disease, or hypermetabolic activity in the neck, abdomen or pelvis was otherwise seen.Though, some small bilateral foci of skin thickening in the lower anterior abdominal wall with low level FDG accumulation was seen; noted to possibly represent a site of disease or related to infection/ inflammation.   The patient accordingly followed up with Dr. Marin Olp on 07/08/21 where she was recommended to start treatment asap consisting of Beryle Flock (first treatment on  07/17/21). Some mild swelling in her legs were noted during this visit, as well as continued SOB.  The patient then met with Dr. Valeta Harms on 07/12/21 for check up of her pleurx catheter. Pleurx cath noted to drain around 20 cc of fluid every few days. The patient was instructed to keep her cath in place until oncology feels like her malignancy treatment has stabilized.   During follow-up with Dr. Marin Olp on 08/14/21, the patient reported continued SOB and lack of fluid drainage from her pleurx cath. Accordingly, Dr. Marin Olp ordered a CXR which demonstrated fairly rapid progression with significant pleural-based nodules, indicative of true progression of her melanoma per Dr. Marin Olp. Nat Math was discontinued at this time due to progression, and the patient was prescribed po Gleevec 400 mg (starting 09/26).  Patient also reported dizziness at this time prompting Dr. Marin Olp to order a brain MRI in addition to restaging PET.  MRI of the brain on 08/27/21 demonstrated a 6 mm enhancing lesion in the inferior right frontal lobe, with mild associated edema (favored to be intra-axial). Findings were overall concerning for metastatic disease.  Restaging PET on 08/27/21 demonstrated progression in size of the hypermetabolic right pleural nodularity compared to prior PET; compatible with worsening pleural metastatic disease. New and increasing hypermetabolic solid pulmonary nodules were also seen throughout the right lung, noted as also compatible with worsening mets. 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  During her most recent follow-up with Dr. Marin Olp on 08/30/21, the patient reported continued lack of drainage from pleurx  cath and continued SOB. Referral to radiation oncology was placed during this visit for consideration of radiosurgery for CNS mets.  Pleurx catheter removed on 09/02/21 by Dr. Erin Fulling.  I have personally looked at the patient's PET scan and MRI.  A 3 Tesla MRI is pending for today. Current/Past Therapy:        Keytruda 200 mg IV q 3 week -- s/p cycle #1 -- d/c on 08/14/2021 for progression Gleevec 400 mg po q day -- start on 08/19/2021  PREVIOUS RADIATION THERAPY: No  PAST MEDICAL HISTORY:  has a past medical history of Arthritis, Goals of care, counseling/discussion (06/21/2021), Malignant pleural effusion (06/21/2021), Melanoma metastatic to brain [facility]), Melanoma metastatic to lung ([place]) (06/21/2021), and Rectal prolapse.    PAST SURGICAL HISTORY: Past Surgical History:  Procedure Laterality Date   ABDOMINAL HYSTERECTOMY     amputation of left finger      2021   BIOPSY  03/27/2021   Procedure: BIOPSY;  Surgeon: Irving Copas., MD;  Location: [facility];  Service: Gastroenterology;;   COLONOSCOPY WITH PROPOFOL N/A 03/27/2021   Procedure: COLONOSCOPY WITH PROPOFOL;  Surgeon: Irving Copas., MD;  Location: [facility];  Service: Gastroenterology;  Laterality: N/A;   JOINT REPLACEMENT     POLYPECTOMY  03/27/2021   Procedure: POLYPECTOMY;  Surgeon: Mansouraty, Telford Nab., MD;  Location: [facility];  Service: Gastroenterology;;   REMOVAL OF PLEURAL DRAINAGE CATHETER  09/02/2021   Procedure: REMOVAL OF PLEURAL DRAINAGE CATHETER;  Surgeon: Freddi Starr, MD;  Location: [facility];  Service: Pulmonary;;   REPLACEMENT TOTAL KNEE Bilateral    XI ROBOT ASSISTED RECTOPEXY N/A 06/12/2021   Procedure: XI ROBOT ASSISTED RECTOPEXY;  Surgeon: Leighton Ruff, MD;  Location: [facility];  Service: General;  Laterality: N/A;    FAMILY HISTORY: family history is not on file.  SOCIAL HISTORY:  reports that she quit smoking about 46 years ago. Her smoking use included cigarettes.  She has a 50.00 pack-year smoking history. She has never used smokeless tobacco. She reports current drug use. Drug: Benzodiazepines. She reports that she does not drink alcohol.  ALLERGIES: Vitamin d analogs  MEDICATIONS:  Current Outpatient Medications  Medication Sig Dispense Refill   acetaminophen (TYLENOL) 500 MG tablet Take 1,000 mg by mouth every 6 (six) hours as needed for mild pain, fever or headache.     albuterol (VENTOLIN HFA) 108 (90 Base) MCG/ACT inhaler Inhale 2 puffs into the lungs every 6 (six) hours as needed for wheezing or shortness of breath. 8 g 2   fluticasone-salmeterol (ADVAIR DISKUS) 250-50 MCG/ACT AEPB Inhale 1 puff into the lungs in the morning and at bedtime. 1 each 1   imatinib (GLEEVEC) 400 MG tablet Take 1 tablet (400 mg total) by mouth daily. Take with meals and large glass of water.Caution:Chemotherapy. 30 tablet 5   levothyroxine (SYNTHROID) 50 MCG tablet Take 1 tablet (50 mcg total) by mouth daily before breakfast. (Patient not taking: Reported on 12/24/2021) 30 tablet 0   Multiple Vitamins-Minerals (PRESERVISION AREDS 2 PO) Take 1 tablet by mouth 2 (two) times daily.     No current facility-administered medications for this encounter.    REVIEW OF SYSTEMS:  Notable for that above.   PHYSICAL EXAM:  vitals were not taken for this visit.   General:  Alert and oriented, in no acute distress     LABORATORY DATA:  Lab Results  Component Value Date   WBC 7.5 12/09/2021   HGB 12.5 12/09/2021   HCT 37.9 12/09/2021   MCV 98.7 12/09/2021   PLT 263 12/09/2021   CMP     Component Value Date/Time   NA 142 12/09/2021 0954   NA 144 07/16/2017 1029   K 4.4 12/09/2021 0954   CL 103 12/09/2021 0954   CO2 31 12/09/2021 0954   GLUCOSE 127 (H) 12/09/2021 0954   BUN 24 (H) 12/09/2021 0954   BUN 14 07/16/2017 1029   CREATININE 0.90 12/09/2021 0954   CALCIUM 9.2 12/09/2021 0954   PROT 6.4 (L) 12/09/2021 0954   PROT 6.7 07/16/2017 1029   ALBUMIN 4.1 12/09/2021  0954   ALBUMIN 4.4 07/16/2017 1029   AST 22 12/09/2021 0954   ALT 13 12/09/2021 0954   ALKPHOS 72 12/09/2021 0954   BILITOT 0.7 12/09/2021 0954   GFRNONAA >60 12/09/2021 0954   GFRAA 81 07/16/2017 1029         RADIOGRAPHY: MR Brain W Wo Contrast  Result Date: 12/20/2021 CLINICAL DATA:  Metastatic melanoma. EXAM: MRI HEAD WITHOUT AND WITH CONTRAST TECHNIQUE: Multiplanar, multiecho pulse sequences of the brain and surrounding structures were obtained without and with intravenous contrast. CONTRAST:  26mL MULTIHANCE GADOBENATE DIMEGLUMINE 529 MG/ML IV SOLN COMPARISON:  09/06/2021 FINDINGS: Brain: A 5 mm enhancing lesion inferiorly in the right frontal lobe is unchanged in size, however there is new mild surrounding edema (series 17, image 69). There are at least 21 new enhancing lesions involving both cerebral hemispheres with many having associated chronic blood products. These measure up to 8 mm in size and demonstrate at most mild edema. There is no mass effect. No enhancing lesions are identified in the posterior fossa. There is no evidence of an acute infarct, midline shift, or extra-axial fluid collection. Small T2 hyperintensities in the cerebral white matter bilaterally are similar to the prior study are nonspecific but compatible with moderate chronic small vessel ischemic disease. Mild cerebral atrophy is within normal limits for age. A right parietal developmental venous anomaly is again noted. Vascular: Major intracranial vascular flow voids are preserved. Skull and upper cervical spine: Unchanged benign-appearing lesion in the left parietal bone. Sinuses/Orbits: Unremarkable orbits. Minimal mucosal thickening in the left maxillary sinus. Clear mastoid air cells. Other: None. IMPRESSION: 1. At least 21 new subcentimeter brain metastases with up to mild edema. 2. Moderate chronic small vessel ischemic disease. Electronically Signed   By: Logan Bores M.D.   On: 12/20/2021 17:16   NM PET Image  Restage (PS) Whole Body  Result Date: 11/30/2021 CLINICAL DATA:  Subsequent treatment strategy for melanoma. EXAM: NUCLEAR MEDICINE PET WHOLE BODY TECHNIQUE: 6.8 mCi F-18 FDG was injected intravenously. Full-ring PET imaging was performed from the head to foot after the radiotracer. CT data was obtained and used for attenuation correction and anatomic localization. Fasting blood glucose: 132 mg/dl COMPARISON:  08/27/2021 FINDINGS: Mediastinal blood pool activity: SUV max 2.3 HEAD/NECK: No hypermetabolic activity in the scalp. No hypermetabolic cervical lymph nodes. Incidental CT findings: none CHEST: Marked interval improvement in the diffuse nodular hypermetabolism associated with the pleura of the right hemithorax. Index medial right apical nodule measured previously at 3.2 x 2.3 cm is now 0.9 x 0.8 cm on 74/4 with SUV max = 1.9, decreased from 6.4 previously. Index right middle lobe pulmonary nodule measured previously at 9 mm is now 4 mm  when measured in the same dimension. SUV max = 0.6 today compared to 2.6 previously. Subpleural nodule posterior left lower lobe measured previously at 1.6 x 1.0 cm is now 1.8 x 0.9 cm. SUV max = 4.0 today compared to 1.6 previously. No new or progressive hypermetabolic disease in the chest. Incidental CT findings: Heart is enlarged. Coronary artery calcification is evident. Moderate atherosclerotic calcification is noted in the wall of the thoracic aorta. ABDOMEN/PELVIS: No abnormal hypermetabolic activity within the liver, pancreas, adrenal glands, or spleen. No hypermetabolic lymph nodes in the abdomen or pelvis. Incidental CT findings: Tiny calcified gallstones again noted. There is advanced atherosclerotic calcification of the abdominal aorta without aneurysm. Left colonic diverticulosis without diverticulitis. SKELETON: No focal hypermetabolic activity to suggest skeletal metastasis. Incidental CT findings: none EXTREMITIES: No abnormal unexpected hypermetabolic activity  in the lower extremities. Incidental CT findings: none IMPRESSION: 1. Overall generalized improvement in the nodular circumferential pleural thickening of the right hemithorax with decreased soft tissue bulk on CT imaging and generalized decreased FDG accumulation on PET imaging. 2. Subpleural posterior left lower lobe pulmonary nodule is similar in size today but shows subtle increase in hypermetabolism. Close continued attention on follow-up recommended. 3. Cholelithiasis. 4. Left colonic diverticulosis without diverticulitis. 5.  Aortic Atherosclerois (ICD10-170.0) Electronically Signed   By: Misty Stanley M.D.   On: 11/30/2021 08:32       IMPRESSION/PLAN:  IMPRESSION/PLAN: This is a very pleasant 88 year old with history of a single brain metastasis from melanoma, treatment with SRS in Oct 2022. Now with at least 21 new metastases in the brain. Systemic disease has been well controlled with Gleevec.  I had a lengthy discussion with the patient and her son after reviewing their MRI results with them. I explained that due to the diffuse nature of the disease in her brain, she would need comprehensive whole brain RT to have a good chance of stunting the tumors' growth or shrinking the tumors.  I specifically recommend a 2 week regimen of hippocampal sparing whole brain radiation with IMRT to reduce the risk of cognitive decline. We discussed the risks, benefits, and side effects of this type of  radiotherapy. Side effects may include but not necessarily be limited to: hair loss, fatigue, seizures, HA, dizziness, neurologic/cognitive decline, brain injury. No guarantees of treatment were given.  The patient are son were encouraged to ask questions that I answered to the best of my ability.    After lengthy discussion, the patient would like to proceed with treatment as recommended to their metastatic disease. CT simulation will take place on tomorrow and treatment about a week thereafter.  I plan to deliver  30 Gy in 10 fraction to the whole brain with hippocampal sparing.  Ophthalmology referral due to vision changes that are not clearly due to metastatic disease - will benefit from full evaluation. Neurology referral (Dr Mickeal Skinner) due to concerns re: seizure risk, safety of driving, neurology effects from disease/treatment.  This encounter was provided by telemedicine platform; patient desired telemedicine during pandemic precautions.  MyChart video was not available , so phone was used. The patient has given verbal consent for this type of encounter and has been advised to only accept a meeting of this type in a secure network environment. On date of service, in total, I spent 45 minutes on this encounter.   The attendants for this meeting include [name]  and Nesha Alinger Bechler During the encounter, [name] was located at [facility]  Radiation Oncology Department.  Mandolin Alinger [place] was located at home.    __________________________________________   [name], MD  This document serves as a record of services personally performed by [name], MD. It was created on her behalf by Roney Mans, a trained medical scribe. The creation of this record is based on the scribe's personal observations and the provider's statements to them. This document has been checked and approved by the attending provider.

## 2021-12-24 ENCOUNTER — Ambulatory Visit: Payer: Medicare Other | Admitting: Radiation Oncology

## 2021-12-24 ENCOUNTER — Ambulatory Visit
Admission: RE | Admit: 2021-12-24 | Discharge: 2021-12-24 | Disposition: A | Discharge status: Home / Self Care | Payer: TRICARE For Life (TFL) | Source: Ambulatory Visit | Attending: Radiation Oncology | Admitting: Radiation Oncology

## 2021-12-24 ENCOUNTER — Ambulatory Visit: Payer: TRICARE For Life (TFL)

## 2021-12-24 ENCOUNTER — Ambulatory Visit: Payer: Medicare Other

## 2021-12-24 ENCOUNTER — Encounter: Payer: Self-pay | Admitting: Radiation Oncology

## 2021-12-24 DIAGNOSIS — C7931 Secondary malignant neoplasm of brain: Secondary | ICD-10-CM

## 2021-12-25 ENCOUNTER — Ambulatory Visit
Admission: RE | Admit: 2021-12-25 | Discharge: 2021-12-25 | Disposition: A | Discharge status: Home / Self Care | Payer: Medicare Other | Source: Ambulatory Visit | Attending: Radiation Oncology | Admitting: Radiation Oncology

## 2021-12-25 ENCOUNTER — Other Ambulatory Visit: Payer: Self-pay | Admitting: Radiation Therapy

## 2021-12-25 ENCOUNTER — Other Ambulatory Visit: Payer: Self-pay

## 2021-12-25 ENCOUNTER — Telehealth: Payer: Self-pay | Admitting: *Deleted

## 2021-12-25 ENCOUNTER — Encounter: Payer: Self-pay | Admitting: Radiation Therapy

## 2021-12-25 ENCOUNTER — Telehealth: Payer: Self-pay | Admitting: Internal Medicine

## 2021-12-25 ENCOUNTER — Inpatient Hospital Stay: Payer: Medicare Other | Admitting: Nurse Practitioner

## 2021-12-25 ENCOUNTER — Institutional Professional Consult (permissible substitution): Payer: Medicare Other | Admitting: Radiation Oncology

## 2021-12-25 DIAGNOSIS — C4362 Malignant melanoma of left upper limb, including shoulder: Secondary | ICD-10-CM | POA: Insufficient documentation

## 2021-12-25 DIAGNOSIS — C7931 Secondary malignant neoplasm of brain: Secondary | ICD-10-CM | POA: Insufficient documentation

## 2021-12-25 DIAGNOSIS — Z510 Encounter for antineoplastic radiation therapy: Secondary | ICD-10-CM | POA: Diagnosis present

## 2021-12-25 DIAGNOSIS — Z923 Personal history of irradiation: Secondary | ICD-10-CM | POA: Insufficient documentation

## 2021-12-25 DIAGNOSIS — C7801 Secondary malignant neoplasm of right lung: Secondary | ICD-10-CM | POA: Insufficient documentation

## 2021-12-25 NOTE — Telephone Encounter (Signed)
Patient has an appt. with Dr. Katy Fitch on 01-02-22- arrival time- 8:30 am , address [address] to inform the patient of this appt.

## 2021-12-25 NOTE — Telephone Encounter (Signed)
Scheduled appt per 2/1 referral. Spoke to pt's son who is aware of appt date and time.

## 2021-12-25 NOTE — Progress Notes (Signed)
Per Dr. Pearlie Oyster request, Ms. Hodges has been scheduled to see Dr. Katy Fitch 2/9 @ 8:30 and Dr. Mickeal Skinner 2/16 @ 12:00. These appointments have been shared with the patient and her son.   Mont Dutton R.T.(R)(T) Radiation Special Procedures Navigator    [name], MD  Pincus Large Cc: Zola Button, RN; Pickenpack-Cousar, Carlena Sax, NP; Ventura Sellers, MD Manuela Schwartz,  Can you arrange  1) Ophthalmology appt due to right eye blurring?  2) appt w/ Dr. Mickeal Skinner as family is interested in discussing driving safety, seizure risk, and having him on board for general neurologic wellness/symptom management in the future?   They are on board w/ hippocampal sparing whole brain RT planning (off trial) tomorrow.   Thanks!

## 2022-01-01 ENCOUNTER — Ambulatory Visit
Admission: RE | Admit: 2022-01-01 | Discharge: 2022-01-01 | Disposition: A | Discharge status: Home / Self Care | Payer: Medicare Other | Source: Ambulatory Visit | Attending: Radiation Oncology | Admitting: Radiation Oncology

## 2022-01-01 ENCOUNTER — Encounter: Payer: Self-pay | Admitting: Hematology & Oncology

## 2022-01-01 ENCOUNTER — Other Ambulatory Visit: Payer: Self-pay

## 2022-01-01 DIAGNOSIS — Z510 Encounter for antineoplastic radiation therapy: Secondary | ICD-10-CM | POA: Diagnosis not present

## 2022-01-02 ENCOUNTER — Ambulatory Visit
Admission: RE | Admit: 2022-01-02 | Discharge: 2022-01-02 | Disposition: A | Discharge status: Home / Self Care | Payer: Medicare Other | Source: Ambulatory Visit | Attending: Radiation Oncology | Admitting: Radiation Oncology

## 2022-01-02 ENCOUNTER — Inpatient Hospital Stay: Payer: Medicare Other | Admitting: Nurse Practitioner

## 2022-01-02 DIAGNOSIS — Z510 Encounter for antineoplastic radiation therapy: Secondary | ICD-10-CM | POA: Diagnosis not present

## 2022-01-03 ENCOUNTER — Ambulatory Visit
Admission: RE | Admit: 2022-01-03 | Discharge: 2022-01-03 | Disposition: A | Discharge status: Home / Self Care | Payer: Medicare Other | Source: Ambulatory Visit | Attending: Radiation Oncology | Admitting: Radiation Oncology

## 2022-01-03 ENCOUNTER — Other Ambulatory Visit: Payer: Self-pay

## 2022-01-03 DIAGNOSIS — Z510 Encounter for antineoplastic radiation therapy: Secondary | ICD-10-CM | POA: Diagnosis not present

## 2022-01-03 NOTE — Progress Notes (Signed)
I need to see if her therapy is working.

## 2022-01-06 ENCOUNTER — Other Ambulatory Visit: Payer: Self-pay

## 2022-01-06 ENCOUNTER — Ambulatory Visit
Admission: RE | Admit: 2022-01-06 | Discharge: 2022-01-06 | Disposition: A | Discharge status: Home / Self Care | Payer: Medicare Other | Source: Ambulatory Visit | Attending: Radiation Oncology | Admitting: Radiation Oncology

## 2022-01-06 DIAGNOSIS — Z510 Encounter for antineoplastic radiation therapy: Secondary | ICD-10-CM | POA: Diagnosis not present

## 2022-01-07 ENCOUNTER — Other Ambulatory Visit: Payer: Self-pay

## 2022-01-07 ENCOUNTER — Other Ambulatory Visit: Payer: Self-pay | Admitting: Hematology & Oncology

## 2022-01-07 ENCOUNTER — Ambulatory Visit
Admission: RE | Admit: 2022-01-07 | Discharge: 2022-01-07 | Disposition: A | Discharge status: Home / Self Care | Payer: Medicare Other | Source: Ambulatory Visit | Attending: Radiation Oncology | Admitting: Radiation Oncology

## 2022-01-07 DIAGNOSIS — E039 Hypothyroidism, unspecified: Secondary | ICD-10-CM

## 2022-01-07 DIAGNOSIS — Z510 Encounter for antineoplastic radiation therapy: Secondary | ICD-10-CM | POA: Diagnosis not present

## 2022-01-08 ENCOUNTER — Ambulatory Visit
Admission: RE | Admit: 2022-01-08 | Discharge: 2022-01-08 | Disposition: A | Discharge status: Home / Self Care | Payer: Medicare Other | Source: Ambulatory Visit | Attending: Radiation Oncology | Admitting: Radiation Oncology

## 2022-01-08 DIAGNOSIS — Z510 Encounter for antineoplastic radiation therapy: Secondary | ICD-10-CM | POA: Diagnosis not present

## 2022-01-09 ENCOUNTER — Inpatient Hospital Stay (HOSPITAL_BASED_OUTPATIENT_CLINIC_OR_DEPARTMENT_OTHER): Payer: Medicare Other | Admitting: Nurse Practitioner

## 2022-01-09 ENCOUNTER — Other Ambulatory Visit: Payer: Self-pay

## 2022-01-09 ENCOUNTER — Ambulatory Visit
Admission: RE | Admit: 2022-01-09 | Discharge: 2022-01-09 | Disposition: A | Discharge status: Home / Self Care | Payer: Medicare Other | Source: Ambulatory Visit | Attending: Radiation Oncology | Admitting: Radiation Oncology

## 2022-01-09 ENCOUNTER — Encounter: Payer: Self-pay | Admitting: Nurse Practitioner

## 2022-01-09 ENCOUNTER — Inpatient Hospital Stay (HOSPITAL_BASED_OUTPATIENT_CLINIC_OR_DEPARTMENT_OTHER): Payer: Medicare Other | Admitting: Internal Medicine

## 2022-01-09 DIAGNOSIS — C7801 Secondary malignant neoplasm of right lung: Secondary | ICD-10-CM | POA: Insufficient documentation

## 2022-01-09 DIAGNOSIS — C7931 Secondary malignant neoplasm of brain: Secondary | ICD-10-CM

## 2022-01-09 DIAGNOSIS — Z7189 Other specified counseling: Secondary | ICD-10-CM

## 2022-01-09 DIAGNOSIS — Z923 Personal history of irradiation: Secondary | ICD-10-CM | POA: Insufficient documentation

## 2022-01-09 DIAGNOSIS — C4362 Malignant melanoma of left upper limb, including shoulder: Secondary | ICD-10-CM

## 2022-01-09 DIAGNOSIS — Z66 Do not resuscitate: Secondary | ICD-10-CM

## 2022-01-09 DIAGNOSIS — Z510 Encounter for antineoplastic radiation therapy: Secondary | ICD-10-CM | POA: Diagnosis not present

## 2022-01-09 DIAGNOSIS — Z515 Encounter for palliative care: Secondary | ICD-10-CM | POA: Diagnosis not present

## 2022-01-09 NOTE — Progress Notes (Signed)
[facility] [place], [place] [address] [phone]   New Patient Evaluation  Date of Service: 01/09/22 Patient Name: Peggy Howard Patient MRN: [id number] Patient DOB: [birth year] Provider: Ventura Sellers, MD  Identifying Statement:  Peggy Howard is a 88 y.o. female with Brain metastasis [facility]) who presents for initial consultation and evaluation regarding cancer associated neurologic deficits.    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 [facility])  06/21/2021 Initial Diagnosis   Melanoma metastatic to lung [facility])   06/21/2021 Cancer Staging   Staging form: Melanoma of the Skin, AJCC 8th Edition - Clinical stage from 06/21/2021: Stage IV (cTX, cNX, cM1b(1)) - Signed by Volanda Napoleon, MD on 06/21/2021 Stage prefix: Initial diagnosis    07/17/2021 -  Chemotherapy   Patient is on Treatment Plan : MELANOMA Pembrolizumab q21d      CNS Oncologic History 09/13/21: SRS to R frontal metastasis [facility]) 01/01/21: Begins WBRT for >20 additional metastases [facility]os)  History of Present Illness: The patient's records from the referring physician were obtained and reviewed and the patient interviewed to confirm this HPI.  Peggy Howard presents to establish care for melanoma associated brain metastases.  She describes no neurologic complaints today.  Denies headaches, seizures.  She is functionally independent and lives alone.  Does have some family nearby to help if needed.  Currently she is driving herself to appointments.  Continues on [place] with Dr. Marin Olp; currently undergoing hippocampal sparing WBRT with Dr. Isidore Moos.  Medications: Current Outpatient Medications on File Prior to Visit  Medication Sig Dispense Refill   acetaminophen (TYLENOL) 500 MG tablet Take 1,000 mg by mouth every 6  (six) hours as needed for mild pain, fever or headache.     albuterol (VENTOLIN HFA) 108 (90 Base) MCG/ACT inhaler Inhale 2 puffs into the lungs every 6 (six) hours as needed for wheezing or shortness of breath. 8 g 2   dorzolamide-timolol (COSOPT) 22.3-6.8 MG/ML ophthalmic solution Place 1 drop into both eyes 2 (two) times daily.     fluticasone-salmeterol (ADVAIR DISKUS) 250-50 MCG/ACT AEPB Inhale 1 puff into the lungs in the morning and at bedtime. 1 each 1   imatinib (GLEEVEC) 400 MG tablet Take 1 tablet (400 mg total) by mouth daily. Take with meals and large glass of water.Caution:Chemotherapy. 30 tablet 5   latanoprost (XALATAN) 0.005 % ophthalmic solution Place 1 drop into both eyes at bedtime.     levothyroxine (SYNTHROID) 50 MCG tablet TAKE 1 TABLET(50 MCG) BY MOUTH DAILY BEFORE AND BREAKFAST 90 tablet 3   Multiple Vitamins-Minerals (PRESERVISION AREDS 2 PO) Take 1 tablet by mouth 2 (two) times daily.     No current facility-administered medications on file prior to visit.    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   Melanoma metastatic to brain [facility])    Melanoma metastatic to lung ([place]) 06/21/2021   Rectal prolapse    Past Surgical History:  Past Surgical History:  Procedure Laterality Date   ABDOMINAL HYSTERECTOMY     amputation of left finger      2021   BIOPSY  03/27/2021  Procedure: BIOPSY;  Surgeon: Irving Copas., MD;  Location: [facility];  Service: Gastroenterology;;   COLONOSCOPY WITH PROPOFOL N/A 03/27/2021   Procedure: COLONOSCOPY WITH PROPOFOL;  Surgeon: Irving Copas., MD;  Location: [facility];  Service: Gastroenterology;  Laterality: N/A;   JOINT REPLACEMENT     POLYPECTOMY  03/27/2021   Procedure:  POLYPECTOMY;  Surgeon: Mansouraty, Telford Nab., MD;  Location: [facility];  Service: Gastroenterology;;   REMOVAL OF PLEURAL DRAINAGE CATHETER  09/02/2021   Procedure: REMOVAL OF PLEURAL DRAINAGE CATHETER;  Surgeon: Freddi Starr, MD;  Location: [facility];  Service: Pulmonary;;   REPLACEMENT TOTAL KNEE Bilateral    XI ROBOT ASSISTED RECTOPEXY N/A 06/12/2021   Procedure: XI ROBOT ASSISTED RECTOPEXY;  Surgeon: Leighton Ruff, MD;  Location: [facility];  Service: General;  Laterality: N/A;   Social History:  Social History   Socioeconomic History   Marital status: Widowed    Spouse name: Not on file   Number of children: Not on file   Years of education: Not on file   Highest education level: Not on file  Occupational History   Not on file  Tobacco Use   Smoking status: Former    Packs/day: 2.00    Years: 25.00    Pack years: 50.00    Types: Cigarettes    Quit date: 97    Years since quitting: 46.1   Smokeless tobacco: Never  Vaping Use   Vaping Use: Never used  Substance and Sexual Activity   Alcohol use: No   Drug use: Yes    Types: Benzodiazepines    Comment: Denies any use. 06/21/21   Sexual activity: Never  Other Topics Concern   Not on file  Social History Narrative   Not on file   Social Determinants of Health   Financial Resource Strain: Not on file  Food Insecurity: Not on file  Transportation Needs: Not on file  Physical Activity: Not on file  Stress: Not on file  Social Connections: Not on file  Intimate Partner Violence: Not on file   Family History: No family history on file.  Review of Systems: Constitutional: Doesn't report fevers, chills or abnormal weight loss Eyes: Doesn't report blurriness of vision Ears, nose, mouth, throat, and face: Doesn't report sore throat Respiratory: Doesn't report cough, dyspnea or wheezes Cardiovascular: Doesn't report palpitation, chest discomfort  Gastrointestinal:  Doesn't report nausea, constipation,  diarrhea GU: Doesn't report incontinence Skin: Doesn't report skin rashes Neurological: Per HPI Musculoskeletal: Doesn't report joint pain Behavioral/Psych: Doesn't report anxiety  Physical Exam: There were no vitals filed for this visit. KPS: 80. General: Alert, cooperative, pleasant, in no acute distress Head: Normal EENT: No conjunctival injection or scleral icterus.  Lungs: Resp effort normal Cardiac: Regular rate Abdomen: Non-distended abdomen Skin: No rashes cyanosis or petechiae. Extremities: No clubbing or edema  Neurologic Exam: Mental Status: Awake, alert, attentive to examiner. Oriented to self and environment. Language is fluent with intact comprehension.  Cranial Nerves: Visual acuity is grossly normal. Visual fields are full. Extra-ocular movements intact. No ptosis. Face is symmetric Motor: Tone and bulk are normal. Power is full in both arms and legs. Reflexes are symmetric, no pathologic reflexes present.  Sensory: Intact to light touch Gait: Normal.   Labs: I have reviewed the data as listed    Component Value Date/Time   NA 142 12/09/2021 0954   NA 144 07/16/2017 1029   K 4.4 12/09/2021 0954   CL 103 12/09/2021 0954   CO2 31  12/09/2021 0954   GLUCOSE 127 (H) 12/09/2021 0954   BUN 24 (H) 12/09/2021 0954   BUN 14 07/16/2017 1029   CREATININE 0.90 12/09/2021 0954   CALCIUM 9.2 12/09/2021 0954   PROT 6.4 (L) 12/09/2021 0954   PROT 6.7 07/16/2017 1029   ALBUMIN 4.1 12/09/2021 0954   ALBUMIN 4.4 07/16/2017 1029   AST 22 12/09/2021 0954   ALT 13 12/09/2021 0954   ALKPHOS 72 12/09/2021 0954   BILITOT 0.7 12/09/2021 0954   GFRNONAA >60 12/09/2021 0954   GFRAA 81 07/16/2017 1029   Lab Results  Component Value Date   WBC 7.5 12/09/2021   NEUTROABS 5.2 12/09/2021   HGB 12.5 12/09/2021   HCT 37.9 12/09/2021   MCV 98.7 12/09/2021   PLT 263 12/09/2021    Imaging:  MR Brain W Wo Contrast  Result Date: 12/20/2021 CLINICAL DATA:  Metastatic melanoma.  EXAM: MRI HEAD WITHOUT AND WITH CONTRAST TECHNIQUE: Multiplanar, multiecho pulse sequences of the brain and surrounding structures were obtained without and with intravenous contrast. CONTRAST:  47mL MULTIHANCE GADOBENATE DIMEGLUMINE 529 MG/ML IV SOLN COMPARISON:  09/06/2021 FINDINGS: Brain: A 5 mm enhancing lesion inferiorly in the right frontal lobe is unchanged in size, however there is new mild surrounding edema (series 17, image 69). There are at least 21 new enhancing lesions involving both cerebral hemispheres with many having associated chronic blood products. These measure up to 8 mm in size and demonstrate at most mild edema. There is no mass effect. No enhancing lesions are identified in the posterior fossa. There is no evidence of an acute infarct, midline shift, or extra-axial fluid collection. Small T2 hyperintensities in the cerebral white matter bilaterally are similar to the prior study are nonspecific but compatible with moderate chronic small vessel ischemic disease. Mild cerebral atrophy is within normal limits for age. A right parietal developmental venous anomaly is again noted. Vascular: Major intracranial vascular flow voids are preserved. Skull and upper cervical spine: Unchanged benign-appearing lesion in the left parietal bone. Sinuses/Orbits: Unremarkable orbits. Minimal mucosal thickening in the left maxillary sinus. Clear mastoid air cells. Other: None. IMPRESSION: 1. At least 21 new subcentimeter brain metastases with up to mild edema. 2. Moderate chronic small vessel ischemic disease. Electronically Signed   By: Logan Bores M.D.   On: 12/20/2021 17:16    [place] Clinician Interpretation: I have personally reviewed the radiological images as listed.  My interpretation, in the context of the patient's clinical presentation, is progressive disease   Assessment/Plan Brain metastasis ([place])  Peggy Howard is clinically stable today, now having completed more than half of  hippocampal sparing WBRT.    Today we counseled her on melanoma brain metastases, prognosis, expectations, expectation for some cognitive decline.  We would advise closer involvement of family, she is agreeable with this.  We spent twenty additional minutes teaching regarding the natural history, biology, and historical experience in the treatment of neurologic complications of cancer.   We appreciate the opportunity to participate in the care of Peggy Howard.  She may follow up with [name] or radiation oncology following 3 months post-WBRT MRI study.   All questions were answered. The patient knows to call the clinic with any problems, questions or concerns. No barriers to learning were detected.  The total time spent in the encounter was 40 minutes and more than 50% was on counseling and review of test results   Ventura Sellers, MD Medical Director of Neuro-Oncology [facility]  [place] 01/09/22 12:13 PM

## 2022-01-09 NOTE — Progress Notes (Signed)
[facility]  [phone] [phone]   Name: Peggy Howard Date: 01/09/2022 MRN: [id number]  DOB: May 01, 1933  Patient Care Team: Pcp, No as PCP - General Volanda Napoleon, MD as Medical Oncologist (Oncology) Cordelia Poche, RN as Oncology Nurse Navigator    REASON FOR CONSULTATION: Peggy Howard is a 88 y.o. 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  Recent brain MRI (12/20/2021) demonstrated 21 new brain metastasis.  Patient currently undergoing whole brain radiation.  Palliative ask to see for symptom management and goals of care.    SOCIAL HISTORY:     reports that she quit smoking about 46 years ago. Her smoking use included cigarettes. She has a 50.00 pack-year smoking history. She has never used smokeless tobacco. She reports current drug use. Drug: Benzodiazepines. She reports that she does not drink alcohol.  ADVANCE DIRECTIVES:  Patient has a documented advanced directive on file.  This was personally reviewed.  Also verbalizes her medical decision-maker is her son Peggy Howard.   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   Melanoma metastatic to brain [facility])    Melanoma metastatic to lung ([place]) 06/21/2021   Rectal prolapse     PAST SURGICAL HISTORY:  Past Surgical History:  Procedure Laterality Date   ABDOMINAL HYSTERECTOMY     amputation of left finger      2021   BIOPSY  03/27/2021   Procedure: BIOPSY;  Surgeon: Irving Copas., MD;  Location: [facility];  Service: Gastroenterology;;   COLONOSCOPY WITH PROPOFOL N/A 03/27/2021   Procedure: COLONOSCOPY WITH PROPOFOL;  Surgeon: Irving Copas., MD;  Location: [facility];  Service: Gastroenterology;  Laterality: N/A;   JOINT  REPLACEMENT     POLYPECTOMY  03/27/2021   Procedure: POLYPECTOMY;  Surgeon: Mansouraty, Telford Nab., MD;  Location: [facility];  Service: Gastroenterology;;   REMOVAL OF PLEURAL DRAINAGE CATHETER  09/02/2021   Procedure: REMOVAL OF PLEURAL DRAINAGE CATHETER;  Surgeon: Freddi Starr, MD;  Location: [facility];  Service: Pulmonary;;   REPLACEMENT TOTAL KNEE Bilateral    XI ROBOT ASSISTED RECTOPEXY N/A 06/12/2021   Procedure: XI ROBOT ASSISTED RECTOPEXY;  Surgeon: Leighton Ruff, MD;  Location: [facility];  Service: General;  Laterality: N/A;    HEMATOLOGY/ONCOLOGY HISTORY:  Oncology History  Melanoma metastatic to lung ([place])  06/21/2021 Initial Diagnosis   Melanoma metastatic to lung ([place])   06/21/2021 Cancer Staging   Staging form: Melanoma of the Skin, AJCC 8th Edition - Clinical stage from 06/21/2021: Stage IV (cTX, cNX, cM1b(1)) - Signed by Volanda Napoleon, MD on 06/21/2021 Stage prefix: Initial diagnosis    07/17/2021 -  Chemotherapy   Patient is on Treatment Plan : MELANOMA Pembrolizumab q21d       ALLERGIES:  is allergic to vitamin d analogs.  MEDICATIONS:  Current Outpatient Medications  Medication Sig Dispense Refill   acetaminophen (TYLENOL) 500 MG tablet Take 1,000 mg by mouth every 6 (six) hours as needed for mild pain, fever or headache.     albuterol (VENTOLIN HFA) 108 (90 Base) MCG/ACT inhaler Inhale 2 puffs into the lungs every 6 (six) hours as needed for wheezing or shortness of breath. 8 g 2   dorzolamide-timolol (COSOPT) 22.3-6.8 MG/ML ophthalmic solution Place 1 drop into both eyes 2 (two)  times daily.     fluticasone-salmeterol (ADVAIR DISKUS) 250-50 MCG/ACT AEPB Inhale 1 puff into the lungs in the morning and at bedtime. 1 each 1   imatinib (GLEEVEC) 400 MG tablet Take 1 tablet (400 mg total) by mouth daily. Take with meals and large glass of water.Caution:Chemotherapy. 30 tablet 5   latanoprost (XALATAN) 0.005 % ophthalmic solution Place 1 drop into both eyes  at bedtime.     levothyroxine (SYNTHROID) 50 MCG tablet TAKE 1 TABLET(50 MCG) BY MOUTH DAILY BEFORE AND BREAKFAST 90 tablet 3   Multiple Vitamins-Minerals (PRESERVISION AREDS 2 PO) Take 1 tablet by mouth 2 (two) times daily.     No current facility-administered medications for this visit.    VITAL SIGNS: There were no vitals taken for this visit. There were no vitals filed for this visit.  Estimated body mass index is 25.93 kg/m as calculated from the following:   Height as of 12/09/21: 5\' 4"  (1.626 m).   Weight as of 12/09/21: 151 lb 0.6 oz (68.5 kg).  LABS: CBC:    Component Value Date/Time   WBC 7.5 12/09/2021 0954   WBC 12.6 (H) 06/28/2021 0412   HGB 12.5 12/09/2021 0954   HGB 13.8 06/26/2017 1045   HCT 37.9 12/09/2021 0954   HCT 41.6 06/26/2017 1045   PLT 263 12/09/2021 0954   PLT 555 (H) 06/26/2017 1045   MCV 98.7 12/09/2021 0954   MCV 95 06/26/2017 1045   NEUTROABS 5.2 12/09/2021 0954   NEUTROABS 5.9 06/26/2017 1045   LYMPHSABS 1.7 12/09/2021 0954   LYMPHSABS 2.0 06/26/2017 1045   MONOABS 0.4 12/09/2021 0954   EOSABS 0.1 12/09/2021 0954   EOSABS 0.1 06/26/2017 1045   BASOSABS 0.1 12/09/2021 0954   BASOSABS 0.0 06/26/2017 1045   Comprehensive Metabolic Panel:    Component Value Date/Time   NA 142 12/09/2021 0954   NA 144 07/16/2017 1029   K 4.4 12/09/2021 0954   CL 103 12/09/2021 0954   CO2 31 12/09/2021 0954   BUN 24 (H) 12/09/2021 0954   BUN 14 07/16/2017 1029   CREATININE 0.90 12/09/2021 0954   GLUCOSE 127 (H) 12/09/2021 0954   CALCIUM 9.2 12/09/2021 0954   AST 22 12/09/2021 0954   ALT 13 12/09/2021 0954   ALKPHOS 72 12/09/2021 0954   BILITOT 0.7 12/09/2021 0954   PROT 6.4 (L) 12/09/2021 0954   PROT 6.7 07/16/2017 1029   ALBUMIN 4.1 12/09/2021 0954   ALBUMIN 4.4 07/16/2017 1029    RADIOGRAPHIC STUDIES: MR Brain W Wo Contrast  Result Date: 12/20/2021 CLINICAL DATA:  Metastatic melanoma. EXAM: MRI HEAD WITHOUT AND WITH CONTRAST TECHNIQUE:  Multiplanar, multiecho pulse sequences of the brain and surrounding structures were obtained without and with intravenous contrast. CONTRAST:  27mL MULTIHANCE GADOBENATE DIMEGLUMINE 529 MG/ML IV SOLN COMPARISON:  09/06/2021 FINDINGS: Brain: A 5 mm enhancing lesion inferiorly in the right frontal lobe is unchanged in size, however there is new mild surrounding edema (series 17, image 69). There are at least 21 new enhancing lesions involving both cerebral hemispheres with many having associated chronic blood products. These measure up to 8 mm in size and demonstrate at most mild edema. There is no mass effect. No enhancing lesions are identified in the posterior fossa. There is no evidence of an acute infarct, midline shift, or extra-axial fluid collection. Small T2 hyperintensities in the cerebral white matter bilaterally are similar to the prior study are nonspecific but compatible with moderate chronic small vessel ischemic disease. Mild cerebral atrophy  is within normal limits for age. A right parietal developmental venous anomaly is again noted. Vascular: Major intracranial vascular flow voids are preserved. Skull and upper cervical spine: Unchanged benign-appearing lesion in the left parietal bone. Sinuses/Orbits: Unremarkable orbits. Minimal mucosal thickening in the left maxillary sinus. Clear mastoid air cells. Other: None. IMPRESSION: 1. At least 21 new subcentimeter brain metastases with up to mild edema. 2. Moderate chronic small vessel ischemic disease. Electronically Signed   By: Logan Bores M.D.   On: 12/20/2021 17:16    PERFORMANCE STATUS (ECOG) : 0 - Asymptomatic  Review of Systems  HENT:  Positive for hearing loss.   Unless otherwise noted, a complete review of systems is negative.  Physical Exam General: NAD, well developed Cardiovascular: regular rate and rhythm Pulmonary: clear ant fields Abdomen: soft, nontender, + bowel sounds Extremities: no edema, no joint deformities Skin: no  rashes Neurological: AAOx4, extremely hard of hearing right greater then left  IMPRESSION: This is my initial visit with Peggy Howard.  She has been followed for ongoing goals of care discussions.  I introduced myself, Advertising copywriter, and Palliative's role in collaboration with the oncology team. Concept of Palliative Care was introduced as specialized medical care for people and their families living with serious illness.  It focuses on providing relief from the symptoms and stress of a serious illness.  The goal is to improve quality of life for both the patient and the family. Values and goals of care important to patient and family were attempted to be elicited.   Peggy Howard is extremely hard of hearing with request to speak on her left side.  Once this is done she is able to appropriately answer all questions and reports better understanding.  She is alone today sharing her son had to work.  She is from [place] and has been in the [name] since 1962.  Widowed.  She had 4 children and unfortunately 1 is deceased.  She lives in the home alone.  Independent of all ADLs.  Continues to actively drive.  Reports appetite is good.  Since she lives alone she generally will eat something that can be warmed up in the microwave or eat out as this is more convenient.  Denies pain.  No recent falls.  Denies headaches or blurred vision.  Appetite is good.  No concerns with sleeping.  Denies constipation or diarrhea.  Denies nausea or vomiting.  Goals of care We discussed Her current illness and what it means in the larger context of Her on-going co-morbidities. Natural disease trajectory and expectations were discussed.  Peggy Howard is clear and realistic in her understanding of her current illness and disease trajectory.  She shares her melanoma originally started on her left ring finger and unfortunately metastasized.  She understands her long-term prognosis is poor.  Expresses she is at peace with this sharing she has  lived a long and prosperous life with no regrets.  She jokes saying she would die before she can afford to purchase hearing aids.  We discussed continuing to take things day by day and remaining active is much as possible.  It is fortunate she is not experiencing any significant symptoms at this time.  She continues to actively drive and although her functional status is good this is somewhat concerning given the extent of brain metastasis and significant hearing loss.  Her children checks on her frequently and provide support.  We discussed at length her advance directive and current CODE STATUS.  Advanced  directive on file and has been reviewed.  She reports her son Peggy Howard is her main Marine scientist.  Confirms wishes for DNR/DNI.  Education provided on what DNR would look like.  Patient verbalized understanding expressing "when it is time for me to go I do not wish to be kept alive or brought back by any artificial means".  Confirmed patient's expressed wishes acknowledging DNR/DNI as her request.  She again confirms expressing her children are aware of her wishes as well.  She is clear in her expressed wishes to continue to treat the treatable.  She does not wish to suffer and if her health was to significantly decline she would then wish to focus solely on her comfort.  I discussed the importance of continued conversation with family and their medical providers regarding overall plan of care and treatment options, ensuring decisions are within the context of the patients values and GOCs.  PLAN: DNR/DNI-as Requested and confirmed by patient Patient has documented advanced directive on file.  This was personally reviewed. No symptom management needs at this time.  We will continue to closely monitor and evaluate as needed. Ongoing goals of care discussions.  Patient is clear in her expressed wishes to continue to treat the treatable allow her every opportunity to continue to thrive as long as her  quality of life is good. I will plan to see patient back in 1-2 weeks in collaboration to other oncology appointments.    Patient expressed understanding and was in agreement with this plan. She also understands that She can call the clinic at any time with any questions, concerns, or complaints.   Time Total: 45 min.   Visit consisted of counseling and education dealing with the complex and emotionally intense issues of symptom management and palliative care in the setting of serious and potentially life-threatening illness.Greater than 50%  of this time was spent counseling and coordinating care related to the above assessment and plan.  Signed by: Alda Lea, AGPCNP-BC Palliative Medicine Team

## 2022-01-10 ENCOUNTER — Telehealth: Payer: Self-pay | Admitting: Hematology & Oncology

## 2022-01-10 ENCOUNTER — Ambulatory Visit
Admission: RE | Admit: 2022-01-10 | Discharge: 2022-01-10 | Disposition: A | Discharge status: Home / Self Care | Payer: Medicare Other | Source: Ambulatory Visit | Attending: Radiation Oncology | Admitting: Radiation Oncology

## 2022-01-10 DIAGNOSIS — Z510 Encounter for antineoplastic radiation therapy: Secondary | ICD-10-CM | POA: Diagnosis not present

## 2022-01-10 NOTE — Telephone Encounter (Signed)
Sch per 2/17 inbasket, pt son is aware

## 2022-01-13 ENCOUNTER — Other Ambulatory Visit: Payer: Self-pay

## 2022-01-13 ENCOUNTER — Ambulatory Visit
Admission: RE | Admit: 2022-01-13 | Discharge: 2022-01-13 | Disposition: A | Discharge status: Home / Self Care | Payer: Medicare Other | Source: Ambulatory Visit | Attending: Radiation Oncology | Admitting: Radiation Oncology

## 2022-01-13 DIAGNOSIS — Z510 Encounter for antineoplastic radiation therapy: Secondary | ICD-10-CM | POA: Diagnosis not present

## 2022-01-14 ENCOUNTER — Encounter: Payer: Self-pay | Admitting: Nurse Practitioner

## 2022-01-14 ENCOUNTER — Ambulatory Visit
Admission: RE | Admit: 2022-01-14 | Discharge: 2022-01-14 | Disposition: A | Discharge status: Home / Self Care | Payer: Medicare Other | Source: Ambulatory Visit | Attending: Radiation Oncology | Admitting: Radiation Oncology

## 2022-01-14 ENCOUNTER — Inpatient Hospital Stay (HOSPITAL_BASED_OUTPATIENT_CLINIC_OR_DEPARTMENT_OTHER): Payer: Medicare Other | Admitting: Nurse Practitioner

## 2022-01-14 ENCOUNTER — Encounter: Payer: Self-pay | Admitting: Radiation Oncology

## 2022-01-14 VITALS — BP 174/83 | HR 77 | Temp 97.4°F | Resp 17 | Wt 154.2 lb

## 2022-01-14 DIAGNOSIS — C7801 Secondary malignant neoplasm of right lung: Secondary | ICD-10-CM

## 2022-01-14 DIAGNOSIS — Z515 Encounter for palliative care: Secondary | ICD-10-CM

## 2022-01-14 DIAGNOSIS — Z7189 Other specified counseling: Secondary | ICD-10-CM | POA: Diagnosis not present

## 2022-01-14 DIAGNOSIS — Z510 Encounter for antineoplastic radiation therapy: Secondary | ICD-10-CM | POA: Diagnosis not present

## 2022-01-14 NOTE — Progress Notes (Signed)
Peggy Howard  [phone] [phone]   Name: Peggy Howard Date: 01/14/2022 MRN: [id number]  DOB: January 14, 1933  Patient Care Team: Pcp, No as PCP - General Peggy Napoleon, MD as Medical Oncologist (Oncology) Cordelia Poche, RN as Oncology Nurse Navigator Pickenpack-Cousar, Carlena Sax, NP as Nurse Practitioner (Nurse Practitioner)    REASON FOR CONSULTATION: Peggy Howard is a 88 y.o. 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  Recent brain MRI (12/20/2021) demonstrated 21 new brain metastasis.  Patient currently undergoing whole brain radiation.  Palliative ask to see for symptom management and goals of care.    SOCIAL HISTORY:     reports that she quit smoking about 46 years ago. Her smoking use included cigarettes. She has a 50.00 pack-year smoking history. She has never used smokeless tobacco. She reports current drug use. Drug: Benzodiazepines. She reports that she does not drink alcohol.  ADVANCE DIRECTIVES:  Patient has a documented advanced directive on file.  This was personally reviewed.  Also verbalizes her medical decision-maker is her son Peggy Howard.   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   Melanoma metastatic to brain [facility])    Melanoma metastatic to lung ([place]) 06/21/2021   Rectal prolapse     PAST SURGICAL HISTORY:  Past Surgical History:  Procedure Laterality Date   ABDOMINAL HYSTERECTOMY     amputation of left finger      2021   BIOPSY  03/27/2021   Procedure: BIOPSY;  Surgeon: Irving Copas., MD;  Location: [facility];  Service: Gastroenterology;;   COLONOSCOPY WITH PROPOFOL N/A 03/27/2021   Procedure: COLONOSCOPY WITH PROPOFOL;  Surgeon: Irving Copas., MD;   Location: [facility];  Service: Gastroenterology;  Laterality: N/A;   JOINT REPLACEMENT     POLYPECTOMY  03/27/2021   Procedure: POLYPECTOMY;  Surgeon: Mansouraty, Telford Nab., MD;  Location: [facility];  Service: Gastroenterology;;   REMOVAL OF PLEURAL DRAINAGE CATHETER  09/02/2021   Procedure: REMOVAL OF PLEURAL DRAINAGE CATHETER;  Surgeon: Freddi Starr, MD;  Location: [facility];  Service: Pulmonary;;   REPLACEMENT TOTAL KNEE Bilateral    XI ROBOT ASSISTED RECTOPEXY N/A 06/12/2021   Procedure: XI ROBOT ASSISTED RECTOPEXY;  Surgeon: Leighton Ruff, MD;  Location: [facility];  Service: General;  Laterality: N/A;    HEMATOLOGY/ONCOLOGY HISTORY:  Oncology History  Melanoma metastatic to lung ([place])  06/21/2021 Initial Diagnosis   Melanoma metastatic to lung ([place])   06/21/2021 Cancer Staging   Staging form: Melanoma of the Skin, AJCC 8th Edition - Clinical stage from 06/21/2021: Stage IV (cTX, cNX, cM1b(1)) - Signed by Peggy Napoleon, MD on 06/21/2021 Stage prefix: Initial diagnosis   07/17/2021 -  Chemotherapy   Patient is on Treatment Plan : MELANOMA Pembrolizumab q21d       ALLERGIES:  is allergic to vitamin d analogs.  MEDICATIONS:  Current Outpatient Medications  Medication Sig Dispense Refill   acetaminophen (TYLENOL) 500 MG tablet Take 1,000 mg by mouth every 6 (six) hours as needed for mild pain, fever or headache.     albuterol (VENTOLIN HFA) 108 (90 Base) MCG/ACT inhaler Inhale 2 puffs into the lungs every 6 (six) hours as needed for wheezing or shortness of breath. 8 g 2   dorzolamide-timolol (COSOPT) 22.3-6.8 MG/ML ophthalmic solution  Place 1 drop into both eyes 2 (two) times daily.     fluticasone-salmeterol (ADVAIR DISKUS) 250-50 MCG/ACT AEPB Inhale 1 puff into the lungs in the morning and at bedtime. 1 each 1   imatinib (GLEEVEC) 400 MG tablet Take 1 tablet (400 mg total) by mouth daily. Take with meals and large glass of water.Caution:Chemotherapy. 30 tablet 5    latanoprost (XALATAN) 0.005 % ophthalmic solution Place 1 drop into both eyes at bedtime.     levothyroxine (SYNTHROID) 50 MCG tablet TAKE 1 TABLET(50 MCG) BY MOUTH DAILY BEFORE AND BREAKFAST 90 tablet 3   Multiple Vitamins-Minerals (PRESERVISION AREDS 2 PO) Take 1 tablet by mouth 2 (two) times daily.     No current facility-administered medications for this visit.    VITAL SIGNS: BP (!) 174/83 (BP Location: Left Arm, Patient Position: Sitting)    Pulse 77    Temp (!) 97.4 F (36.3 C) (Oral)    Resp 17    Wt 154 lb 4 oz (70 kg)    SpO2 91%    BMI 26.48 kg/m  Filed Weights   01/14/22 1153  Weight: 154 lb 4 oz (70 kg)    Estimated body mass index is 26.48 kg/m as calculated from the following:   Height as of 12/09/21: 5\' 4"  (1.626 m).   Weight as of this encounter: 154 lb 4 oz (70 kg).  PERFORMANCE STATUS (ECOG) : 1 - Symptomatic but completely ambulatory   Physical Exam General: NAD, well developed Cardiovascular: RRR Pulmonary: normal breathing pattern  Neurological: AAOx4, extremely hard of hearing right greater then left  IMPRESSION: Ms. Peggy Howard presents to the clinic today alone. She completed final radiation treatment today. Jarrett Soho, RN ask how she feels about completing therapy and she smiles saying "I am still alive, just waiting on the day I'm not!" Continues to acknowledge understanding of her disease trajectory and long-term prognosis. Is comfortable in this and has shared her affairs are in order.   She denies pain. No recent falls.  Denies headaches or blurred vision.  Appetite is good.  No concerns with sleeping.  Denies constipation or diarrhea.  Denies nausea or vomiting. Endorses occasional headaches. Is independent of all ADLS and is not having any difficulty living alone. Her sons check on her frequently.    Goals of care  She is clear in her expressed wishes to continue to treat the treatable.  She does not wish to suffer and if her health was to significantly  decline she would then wish to focus solely on her comfort.  I discussed the importance of continued conversation with family and their medical providers regarding overall plan of care and treatment options, ensuring decisions are within the context of the patients values and GOCs.  PLAN: No symptom management needs at this time.  We will continue to closely monitor and evaluate as needed. Ongoing goals of care discussions.  Patient is clear in her expressed wishes to continue to treat the treatable allow her every opportunity to continue to thrive as long as her quality of life is good. I will plan to see patient back in 3-4 weeks in collaboration to other oncology appointments.    Patient expressed understanding and was in agreement with this plan. She also understands that She can call the clinic at any time with any questions, concerns, or complaints.   Time Total: 20 min  Visit consisted of counseling and education dealing with the complex and emotionally intense issues of symptom management and  palliative care in the setting of serious and potentially life-threatening illness.Greater than 50%  of this time was spent counseling and coordinating care related to the above assessment and plan.  Signed by: Alda Lea, AGPCNP-BC [place]

## 2022-01-15 ENCOUNTER — Telehealth: Payer: Self-pay | Admitting: Nurse Practitioner

## 2022-01-15 NOTE — Telephone Encounter (Signed)
Scheduled per 2/21 los, pt son has been called and confirmed

## 2022-01-17 ENCOUNTER — Other Ambulatory Visit: Payer: Self-pay | Admitting: Hematology & Oncology

## 2022-01-27 ENCOUNTER — Other Ambulatory Visit: Payer: Self-pay

## 2022-01-27 ENCOUNTER — Telehealth: Payer: Self-pay | Admitting: *Deleted

## 2022-01-27 ENCOUNTER — Encounter: Payer: Self-pay | Admitting: Hematology & Oncology

## 2022-01-27 ENCOUNTER — Inpatient Hospital Stay (HOSPITAL_BASED_OUTPATIENT_CLINIC_OR_DEPARTMENT_OTHER): Payer: Medicare Other | Admitting: Hematology & Oncology

## 2022-01-27 ENCOUNTER — Other Ambulatory Visit: Payer: Self-pay | Admitting: *Deleted

## 2022-01-27 ENCOUNTER — Other Ambulatory Visit (HOSPITAL_BASED_OUTPATIENT_CLINIC_OR_DEPARTMENT_OTHER): Payer: Self-pay

## 2022-01-27 ENCOUNTER — Inpatient Hospital Stay: Payer: Medicare Other | Attending: Hematology & Oncology

## 2022-01-27 VITALS — BP 149/68 | HR 78 | Temp 98.0°F | Resp 18 | Wt 149.0 lb

## 2022-01-27 DIAGNOSIS — C4362 Malignant melanoma of left upper limb, including shoulder: Secondary | ICD-10-CM | POA: Insufficient documentation

## 2022-01-27 DIAGNOSIS — E039 Hypothyroidism, unspecified: Secondary | ICD-10-CM

## 2022-01-27 DIAGNOSIS — J90 Pleural effusion, not elsewhere classified: Secondary | ICD-10-CM | POA: Insufficient documentation

## 2022-01-27 DIAGNOSIS — Z79899 Other long term (current) drug therapy: Secondary | ICD-10-CM | POA: Diagnosis not present

## 2022-01-27 DIAGNOSIS — C7801 Secondary malignant neoplasm of right lung: Secondary | ICD-10-CM

## 2022-01-27 DIAGNOSIS — C7931 Secondary malignant neoplasm of brain: Secondary | ICD-10-CM | POA: Diagnosis present

## 2022-01-27 DIAGNOSIS — C7800 Secondary malignant neoplasm of unspecified lung: Secondary | ICD-10-CM | POA: Diagnosis not present

## 2022-01-27 LAB — CBC WITH DIFFERENTIAL (CANCER CENTER ONLY)
Abs Immature Granulocytes: 0.03 10*3/uL (ref 0.00–0.07)
Basophils Absolute: 0 10*3/uL (ref 0.0–0.1)
Basophils Relative: 1 %
Eosinophils Absolute: 0 10*3/uL (ref 0.0–0.5)
Eosinophils Relative: 1 %
HCT: 35.5 % — ABNORMAL LOW (ref 36.0–46.0)
Hemoglobin: 11.9 g/dL — ABNORMAL LOW (ref 12.0–15.0)
Immature Granulocytes: 1 %
Lymphocytes Relative: 12 %
Lymphs Abs: 0.7 10*3/uL (ref 0.7–4.0)
MCH: 33.6 pg (ref 26.0–34.0)
MCHC: 33.5 g/dL (ref 30.0–36.0)
MCV: 100.3 fL — ABNORMAL HIGH (ref 80.0–100.0)
Monocytes Absolute: 0.4 10*3/uL (ref 0.1–1.0)
Monocytes Relative: 6 %
Neutro Abs: 4.8 10*3/uL (ref 1.7–7.7)
Neutrophils Relative %: 79 %
Platelet Count: 203 10*3/uL (ref 150–400)
RBC: 3.54 MIL/uL — ABNORMAL LOW (ref 3.87–5.11)
RDW: 12.8 % (ref 11.5–15.5)
WBC Count: 5.9 10*3/uL (ref 4.0–10.5)
nRBC: 0 % (ref 0.0–0.2)

## 2022-01-27 LAB — CMP (CANCER CENTER ONLY)
ALT: 12 U/L (ref 0–44)
AST: 30 U/L (ref 15–41)
Albumin: 3.7 g/dL (ref 3.5–5.0)
Alkaline Phosphatase: 55 U/L (ref 38–126)
Anion gap: 8 (ref 5–15)
BUN: 29 mg/dL — ABNORMAL HIGH (ref 8–23)
CO2: 30 mmol/L (ref 22–32)
Calcium: 8.7 mg/dL — ABNORMAL LOW (ref 8.9–10.3)
Chloride: 102 mmol/L (ref 98–111)
Creatinine: 0.94 mg/dL (ref 0.44–1.00)
GFR, Estimated: 59 mL/min — ABNORMAL LOW (ref >60)
Glucose, Bld: 134 mg/dL — ABNORMAL HIGH (ref 70–99)
Potassium: 3.9 mmol/L (ref 3.5–5.1)
Sodium: 140 mmol/L (ref 135–145)
Total Bilirubin: 0.6 mg/dL (ref 0.3–1.2)
Total Protein: 6.1 g/dL — ABNORMAL LOW (ref 6.5–8.1)

## 2022-01-27 LAB — LACTATE DEHYDROGENASE: LDH: 411 U/L — ABNORMAL HIGH (ref 98–192)

## 2022-01-27 MED ORDER — DRONABINOL 2.5 MG PO CAPS
2.5000 mg | ORAL_CAPSULE | Freq: Two times a day (BID) | ORAL | 0 refills | Status: DC
  Filled 2022-01-27: qty 60, 30d supply, fill #0

## 2022-01-27 MED ORDER — DRONABINOL 2.5 MG PO CAPS: 2.5000 mg | ORAL_CAPSULE | Freq: Two times a day (BID) | ORAL | 0 refills | Status: DC

## 2022-01-27 NOTE — Telephone Encounter (Signed)
Call received from patient's son [name] stating that Marinol is on back order and that [facility] on [address] does not have any available. Prescription canceled at this [facility] and resent to [facility] as they do have Marinol available.  [name] notified and is appreciative of assistance.  ? ? ?

## 2022-01-27 NOTE — Progress Notes (Signed)
?Hematology and Oncology Follow Up Visit ? ?Lacrecia Alinger Longbottom ?[id number] ?March 07, 1933 88 y.o. ?01/27/2022 ? ? ?Principle Diagnosis:  ?Recurrent melanoma-right pleural effusion  -- BRAFwt/ c-Kit (+) ? ?Current Therapy:   ?Keytruda 200 mg IV q 3 week -- s/p cycle #1 -- d/c on 08/14/2021 for progression ?SBRT to CNS metastasis on 09/13/2021 ?Gleevec 400 mg po q day -- start on 08/19/2021 ?WBRT -- completed on 01/14/2022 ?    ?Interim History:  Ms. Kallenberger is back for follow-up.  Unfortunately, she is found to have 20 new brain metastasis.  This was on a routine follow-up for her past SBRT to the brain.  This was found back in January.  She subsequently underwent whole brain radiation therapy.  She had 2 weeks worth.  She received 3000 and rad. ? ?She really has had no neurological findings.  She does feel tired.  She does not have much of an appetite.  I think we need to try her on some Marinol to see if this may help a little bit.  I will put her on 2.5 mg p.o. twice daily. ? ?She has had no issues with vomiting.  There is been no change in bowel or bladder habits.  She has had no leg swelling..  She is on Gleevec.  She is done incredibly well with Gleevec. ? ?She has had no shortness of breath.  Her son thought that she may have had a little bit of wheezing but this seems to have gotten better. ? ?Overall, I would say performance status is probably ECOG 1.   ? ? ?Medications:  ?Current Outpatient Medications:  ?  albuterol (VENTOLIN HFA) 108 (90 Base) MCG/ACT inhaler, Inhale 2 puffs into the lungs every 6 (six) hours as needed for wheezing or shortness of breath., Disp: 8 g, Rfl: 2 ?  dorzolamide-timolol (COSOPT) 22.3-6.8 MG/ML ophthalmic solution, Place 1 drop into both eyes 2 (two) times daily., Disp: , Rfl:  ?  fluticasone-salmeterol (ADVAIR DISKUS) 250-50 MCG/ACT AEPB, Inhale 1 puff into the lungs in the morning and at bedtime., Disp: 1 each, Rfl: 1 ?  levothyroxine (SYNTHROID) 50 MCG tablet, TAKE 1 TABLET(50 MCG)  BY MOUTH DAILY BEFORE AND BREAKFAST, Disp: 90 tablet, Rfl: 3 ?  Multiple Vitamins-Minerals (PRESERVISION AREDS 2 PO), Take 1 tablet by mouth 2 (two) times daily., Disp: , Rfl:  ?  NON FORMULARY, Organic Protein 2 drinks/scoops a day, Disp: , Rfl:  ?  acetaminophen (TYLENOL) 500 MG tablet, Take 1,000 mg by mouth every 6 (six) hours as needed for mild pain, fever or headache. (Patient not taking: Reported on 01/27/2022), Disp: , Rfl:  ?  imatinib (GLEEVEC) 400 MG tablet, TAKE 1 TABLET DAILY. TAKE WITH MEALS AND LARGE GLASS OF WATER. CAUTION CHEMOTHERAPY (Patient not taking: Reported on 01/27/2022), Disp: 30 tablet, Rfl: 5 ?  latanoprost (XALATAN) 0.005 % ophthalmic solution, Place 1 drop into both eyes at bedtime. (Patient not taking: Reported on 01/27/2022), Disp: , Rfl:  ? ?Allergies:  ?Allergies  ?Allergen Reactions  ? Vitamin D Analogs Other (See Comments)  ?  "dizziness"  ? ? ?Past Medical History, Surgical history, Social history, and Family History were reviewed and updated. ? ?Review of Systems: ?Review of Systems  ?Constitutional: Negative.   ?HENT:  Negative.    ?Eyes: Negative.   ?Respiratory:  Positive for shortness of breath.   ?Cardiovascular: Negative.   ?Gastrointestinal: Negative.   ?Endocrine: Negative.   ?Genitourinary: Negative.    ?Musculoskeletal: Negative.   ?Skin: Negative.   ?  Neurological: Negative.   ?Psychiatric/Behavioral: Negative.    ? ?Physical Exam: ? weight is 149 lb (67.6 kg). Her oral temperature is 98 ?F (36.7 ?C). Her blood pressure is 149/68 (abnormal) and her pulse is 78. Her respiration is 18 and oxygen saturation is 93%.  ? ?Wt Readings from Last 3 Encounters:  ?01/27/22 149 lb (67.6 kg)  ?01/14/22 154 lb 4 oz (70 kg)  ?12/09/21 151 lb 0.6 oz (68.5 kg)  ? ? ?Physical Exam ?Vitals reviewed.  ?HENT:  ?   Head: Normocephalic and atraumatic.  ?Eyes:  ?   Pupils: Pupils are equal, round, and reactive to light.  ?Cardiovascular:  ?   Rate and Rhythm: Normal rate and regular rhythm.  ?    Heart sounds: Normal heart sounds.  ?Pulmonary:  ?   Effort: Pulmonary effort is normal.  ?   Breath sounds: Normal breath sounds.  ?   Comments: Pulmonary exam shows decent breath sounds bilaterally.  She may have some congestion and wheezes bilaterally.  She may have a little bit decreased breath sounds at the right lung base. ?Abdominal:  ?   General: Bowel sounds are normal.  ?   Palpations: Abdomen is soft.  ?Musculoskeletal:     ?   General: No tenderness or deformity. Normal range of motion.  ?   Cervical back: Normal range of motion.  ?   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  This is the ring finger.  She has a amputation down to the proximal interphalangeal joint.  ?Lymphadenopathy:  ?   Cervical: No cervical adenopathy.  ?Skin: ?   General: Skin is warm and dry.  ?   Findings: No erythema or rash.  ?Neurological:  ?   Mental Status: She is alert and oriented to person, place, and time.  ?Psychiatric:     ?   Behavior: Behavior normal.     ?   Thought Content: Thought content normal.     ?   Judgment: Judgment normal.  ? ? ? ?Lab Results  ?Component Value Date  ? WBC 5.9 01/27/2022  ? HGB 11.9 (L) 01/27/2022  ? HCT 35.5 (L) 01/27/2022  ? MCV 100.3 (H) 01/27/2022  ? PLT 203 01/27/2022  ? ?  Chemistry   ?   ?Component Value Date/Time  ? NA 140 01/27/2022 1022  ? NA 144 07/16/2017 1029  ? K 3.9 01/27/2022 1022  ? CL 102 01/27/2022 1022  ? CO2 30 01/27/2022 1022  ? BUN 29 (H) 01/27/2022 1022  ? BUN 14 07/16/2017 1029  ? CREATININE 0.94 01/27/2022 1022  ?    ?Component Value Date/Time  ? CALCIUM 8.7 (L) 01/27/2022 1022  ? ALKPHOS 55 01/27/2022 1022  ? AST 30 01/27/2022 1022  ? ALT 12 01/27/2022 1022  ? BILITOT 0.6 01/27/2022 1022  ?  ? ? ?Impression and Plan: ? ?Ms. Karch is a very charming 88 year old white female.  She has recurrent melanoma.  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 ? ?I hate the fact that she had recurrence in the  brain.  Hopefully, the whole brain radiation will help.  I am sure that she will have another MRI of the brain in a couple months.  I think that Radiation Oncology is in charge of that. ? ?I would like to get another PET scan on her.  I would get 1 in April.  By then, she would  have been on [place] for good 7 months. ? ?I had believe that the [place] is working systemically.  She looks good.  Her labs look great. ? ?I will plan to see her back after her PET scan is done. ? ? ? ?Volanda Napoleon, MD ?3/6/202311:30 AM  ?

## 2022-01-28 ENCOUNTER — Other Ambulatory Visit (HOSPITAL_BASED_OUTPATIENT_CLINIC_OR_DEPARTMENT_OTHER): Payer: Self-pay

## 2022-01-28 ENCOUNTER — Encounter: Payer: Self-pay | Admitting: *Deleted

## 2022-01-28 ENCOUNTER — Other Ambulatory Visit (HOSPITAL_COMMUNITY): Payer: Self-pay

## 2022-01-28 ENCOUNTER — Other Ambulatory Visit: Payer: Self-pay

## 2022-01-28 LAB — TSH: TSH: 5.069 u[IU]/mL — ABNORMAL HIGH (ref 0.308–3.960)

## 2022-01-28 LAB — T4: T4, Total: 5.7 ug/dL (ref 4.5–12.0)

## 2022-01-28 MED ORDER — DRONABINOL 2.5 MG PO CAPS
2.5000 mg | ORAL_CAPSULE | Freq: Two times a day (BID) | ORAL | 0 refills | Status: DC
  Filled 2022-01-28: qty 6, 3d supply, fill #0
  Filled 2022-01-28: qty 54, 27d supply, fill #0

## 2022-01-28 NOTE — Progress Notes (Signed)
Since her last visit patient was found to have new brain mets and completed whole brain radiation. She will need a PET prior to her next appointment.  ? ?PET scheduled for 03/03/2022. Called and spoke to [place]. He is aware of appointment including date, time and location. Radiology info sheet with above info also mailed to patient home.  ? ?Oncology Nurse Navigator Documentation ? ?Oncology Nurse Navigator Flowsheets 01/28/2022  ?Planned Course of Treatment -  ?Phase of Treatment Radiation  ?Chemotherapy Pending- Reason: -  ?Chemotherapy Actual Start Date: -  ?Radiation Actual Start Date: 01/01/2022  ?Radiation Actual End Date: 01/14/2022  ?Navigator Follow Up Date: 03/03/2022  ?Navigator Follow Up Reason: Scan Review  ?Navigator Location [facility]  ?Navigator Encounter Type Appt/Treatment Plan Review  ?Telephone -  ?Treatment Initiated Date -  ?Patient Visit Type MedOnc  ?Treatment Phase Active Tx  ?Barriers/Navigation Needs Coordination of Care;Education  ?Education Other  ?Interventions Coordination of Care;Education  ?Acuity Level 2-Minimal Needs (1-2 Barriers Identified)  ?Referrals -  ?Coordination of Care Radiology  ?Education Method Verbal;Written  ?Support Groups/Services Friends and Family  ?Time Spent with Patient 30  ?  ?

## 2022-01-29 ENCOUNTER — Encounter: Payer: Self-pay | Admitting: Hematology & Oncology

## 2022-01-29 ENCOUNTER — Other Ambulatory Visit (HOSPITAL_COMMUNITY): Payer: Self-pay

## 2022-01-29 NOTE — Progress Notes (Signed)
° °                                                                                                                                                          °  Patient Name: Peggy Howard MRN: [id number] DOB: 1933-08-30 Referring Physician: Burney Gauze (Profile Not Attached) Date of Service: 01/14/2022 [facility] [place]                                                        End Of Treatment Note  Diagnoses: C79.31-Secondary malignant neoplasm of brain  Cancer Staging: STAGE IV Melanoma  Intent: Palliative  Radiation Treatment Dates: 01/01/2022 through 01/14/2022 Site Technique Total Dose (Gy) Dose per Fx (Gy) Completed Fx Beam Energies  Brain: Brain_HCspare IMRT 30/30 3 10/10 6X   Whole brain radiation with hippocampal sparing was performed for multiple brain metastases.  Narrative: The patient tolerated radiation therapy relatively well.   Plan: The patient will follow-up with radiation oncology in 49mo. -----------------------------------  [name], MD

## 2022-01-31 ENCOUNTER — Telehealth: Payer: Self-pay

## 2022-01-31 NOTE — Telephone Encounter (Signed)
Attempted to call pt son back after receiving VM from him; no answer and unable to leave message. Will try again this afternoon.  ?

## 2022-01-31 NOTE — Telephone Encounter (Signed)
Pt son Peggy Howard) left a voicemail asking about side effects of marinol that was prescribed to his mom earlier this week. Peggy Howard states that his mom is experiencing nausea, vomiting and diarrhea. Reviewed symptoms with Dr. Marin Olp that stated these are not side effects of marinol but more concerning for a GI virus. This RN talked with Peggy Howard and informed him that these symptoms were not side effects of medication. Educated [name] to have his mom stay hydrated, eat a bland diet and take immodium for diarrhea as needed. [name] verbalized understanding and had no further questions. Pt aware that if his mom develops any further symptoms to seek emergency care and to update clinic. [name] verbalized understanding and had no further questions.  ?

## 2022-02-02 ENCOUNTER — Inpatient Hospital Stay (HOSPITAL_COMMUNITY)
Admission: EM | Admit: 2022-02-02 | Discharge: 2022-02-04 | DRG: 391 | Disposition: A | Discharge status: Home / Self Care | Payer: Medicare Other | Attending: Internal Medicine | Admitting: Internal Medicine

## 2022-02-02 ENCOUNTER — Emergency Department (HOSPITAL_COMMUNITY): Payer: Medicare Other

## 2022-02-02 ENCOUNTER — Other Ambulatory Visit: Payer: Self-pay

## 2022-02-02 ENCOUNTER — Encounter (HOSPITAL_COMMUNITY): Payer: Self-pay

## 2022-02-02 DIAGNOSIS — E875 Hyperkalemia: Secondary | ICD-10-CM | POA: Diagnosis present

## 2022-02-02 DIAGNOSIS — Z9221 Personal history of antineoplastic chemotherapy: Secondary | ICD-10-CM | POA: Diagnosis not present

## 2022-02-02 DIAGNOSIS — R112 Nausea with vomiting, unspecified: Principal | ICD-10-CM | POA: Diagnosis present

## 2022-02-02 DIAGNOSIS — Z87891 Personal history of nicotine dependence: Secondary | ICD-10-CM | POA: Diagnosis not present

## 2022-02-02 DIAGNOSIS — Z90710 Acquired absence of both cervix and uterus: Secondary | ICD-10-CM | POA: Diagnosis not present

## 2022-02-02 DIAGNOSIS — Z923 Personal history of irradiation: Secondary | ICD-10-CM

## 2022-02-02 DIAGNOSIS — C7931 Secondary malignant neoplasm of brain: Secondary | ICD-10-CM | POA: Diagnosis present

## 2022-02-02 DIAGNOSIS — G936 Cerebral edema: Secondary | ICD-10-CM | POA: Diagnosis present

## 2022-02-02 DIAGNOSIS — C7802 Secondary malignant neoplasm of left lung: Secondary | ICD-10-CM | POA: Diagnosis present

## 2022-02-02 DIAGNOSIS — E039 Hypothyroidism, unspecified: Secondary | ICD-10-CM | POA: Diagnosis present

## 2022-02-02 DIAGNOSIS — Z7951 Long term (current) use of inhaled steroids: Secondary | ICD-10-CM

## 2022-02-02 DIAGNOSIS — C7801 Secondary malignant neoplasm of right lung: Secondary | ICD-10-CM | POA: Diagnosis present

## 2022-02-02 DIAGNOSIS — Z20822 Contact with and (suspected) exposure to covid-19: Secondary | ICD-10-CM | POA: Diagnosis present

## 2022-02-02 DIAGNOSIS — R42 Dizziness and giddiness: Secondary | ICD-10-CM

## 2022-02-02 DIAGNOSIS — Z79890 Hormone replacement therapy (postmenopausal): Secondary | ICD-10-CM

## 2022-02-02 DIAGNOSIS — J910 Malignant pleural effusion: Secondary | ICD-10-CM | POA: Diagnosis present

## 2022-02-02 DIAGNOSIS — R062 Wheezing: Secondary | ICD-10-CM

## 2022-02-02 DIAGNOSIS — Z66 Do not resuscitate: Secondary | ICD-10-CM | POA: Diagnosis present

## 2022-02-02 DIAGNOSIS — D539 Nutritional anemia, unspecified: Secondary | ICD-10-CM | POA: Diagnosis present

## 2022-02-02 DIAGNOSIS — Z79899 Other long term (current) drug therapy: Secondary | ICD-10-CM

## 2022-02-02 DIAGNOSIS — Z96653 Presence of artificial knee joint, bilateral: Secondary | ICD-10-CM | POA: Diagnosis present

## 2022-02-02 DIAGNOSIS — C7800 Secondary malignant neoplasm of unspecified lung: Secondary | ICD-10-CM | POA: Diagnosis present

## 2022-02-02 DIAGNOSIS — N3090 Cystitis, unspecified without hematuria: Secondary | ICD-10-CM | POA: Diagnosis present

## 2022-02-02 DIAGNOSIS — N390 Urinary tract infection, site not specified: Secondary | ICD-10-CM | POA: Diagnosis present

## 2022-02-02 DIAGNOSIS — C439 Malignant melanoma of skin, unspecified: Secondary | ICD-10-CM | POA: Diagnosis present

## 2022-02-02 DIAGNOSIS — R197 Diarrhea, unspecified: Secondary | ICD-10-CM | POA: Diagnosis present

## 2022-02-02 LAB — CBC WITH DIFFERENTIAL/PLATELET
Abs Immature Granulocytes: 0.02 10*3/uL (ref 0.00–0.07)
Basophils Absolute: 0 10*3/uL (ref 0.0–0.1)
Basophils Relative: 1 %
Eosinophils Absolute: 0.1 10*3/uL (ref 0.0–0.5)
Eosinophils Relative: 1 %
HCT: 36.3 % (ref 36.0–46.0)
Hemoglobin: 11.8 g/dL — ABNORMAL LOW (ref 12.0–15.0)
Immature Granulocytes: 0 %
Lymphocytes Relative: 19 %
Lymphs Abs: 1.2 10*3/uL (ref 0.7–4.0)
MCH: 32.9 pg (ref 26.0–34.0)
MCHC: 32.5 g/dL (ref 30.0–36.0)
MCV: 101.1 fL — ABNORMAL HIGH (ref 80.0–100.0)
Monocytes Absolute: 0.5 10*3/uL (ref 0.1–1.0)
Monocytes Relative: 7 %
Neutro Abs: 4.6 10*3/uL (ref 1.7–7.7)
Neutrophils Relative %: 72 %
Platelets: 242 10*3/uL (ref 150–400)
RBC: 3.59 MIL/uL — ABNORMAL LOW (ref 3.87–5.11)
RDW: 12.9 % (ref 11.5–15.5)
WBC: 6.3 10*3/uL (ref 4.0–10.5)
nRBC: 0 % (ref 0.0–0.2)

## 2022-02-02 LAB — URINALYSIS, ROUTINE W REFLEX MICROSCOPIC
Bilirubin Urine: NEGATIVE
Glucose, UA: NEGATIVE mg/dL
Hgb urine dipstick: NEGATIVE
Ketones, ur: NEGATIVE mg/dL
Nitrite: POSITIVE — AB
Protein, ur: NEGATIVE mg/dL
Specific Gravity, Urine: 1.01 (ref 1.005–1.030)
pH: 5.5 (ref 5.0–8.0)

## 2022-02-02 LAB — COMPREHENSIVE METABOLIC PANEL
ALT: 14 U/L (ref 0–44)
AST: 34 U/L (ref 15–41)
Albumin: 3.7 g/dL (ref 3.5–5.0)
Alkaline Phosphatase: 52 U/L (ref 38–126)
Anion gap: 7 (ref 5–15)
BUN: 30 mg/dL — ABNORMAL HIGH (ref 8–23)
CO2: 28 mmol/L (ref 22–32)
Calcium: 8.5 mg/dL — ABNORMAL LOW (ref 8.9–10.3)
Chloride: 101 mmol/L (ref 98–111)
Creatinine, Ser: 0.66 mg/dL (ref 0.44–1.00)
GFR, Estimated: >60 mL/min (ref >60)
Glucose, Bld: 119 mg/dL — ABNORMAL HIGH (ref 70–99)
Potassium: 4 mmol/L (ref 3.5–5.1)
Sodium: 136 mmol/L (ref 135–145)
Total Bilirubin: 0.5 mg/dL (ref 0.3–1.2)
Total Protein: 6.8 g/dL (ref 6.5–8.1)

## 2022-02-02 LAB — RESP PANEL BY RT-PCR (FLU A&B, COVID) ARPGX2
Influenza A by PCR: NEGATIVE
Influenza B by PCR: NEGATIVE
SARS Coronavirus 2 by RT PCR: NEGATIVE

## 2022-02-02 LAB — URINALYSIS, MICROSCOPIC (REFLEX)

## 2022-02-02 IMAGING — DX DG CHEST 1V PORT
1 series · 1 of 1 positions shown · non-contrast
Comparison: [DATE]

CLINICAL DATA: Nausea, vomiting

EXAM:
PORTABLE CHEST 1 VIEW

[chest ap]
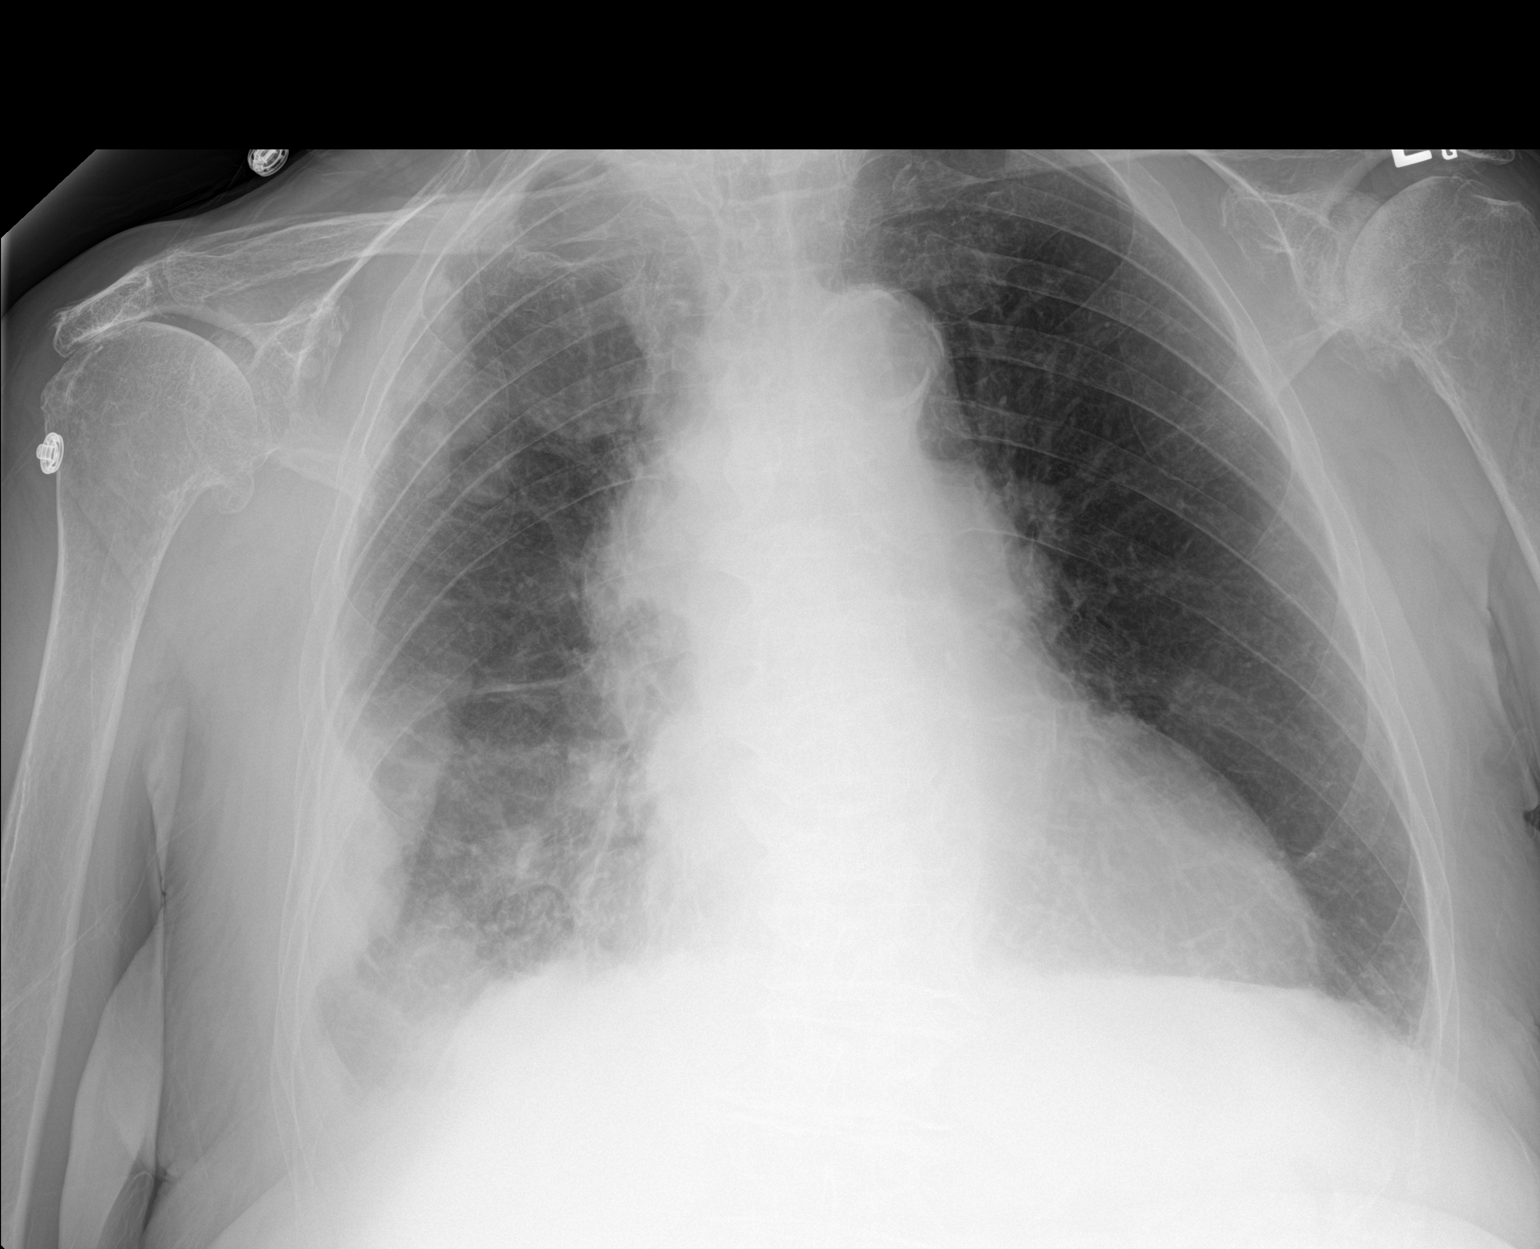

[1 of 1 positions shown; findings below may reference images not displayed]

FINDINGS: Cardiomegaly. Aortic atherosclerosis. Prominent hila bilaterally
could be vascular although adenopathy cannot be excluded. Loculated
right pleural effusion. Nodular densities project over the right
upper lobe which could be loculated fluid or nodules. Airspace
disease in the right lower lobe. Left lung clear.
IMPRESSION: Cardiomegaly. Bilateral hilar fullness which could be vascular or
related to adenopathy. Consider further evaluation with chest CT
with IV contrast.

Loculated right pleural effusion with airspace disease in the right
lower lobe.

Nodular densities peripherally in the right upper lobe could reflect
loculated fluid or nodules. This could be further evaluated with
chest CT.

## 2022-02-02 MED ORDER — LACTATED RINGERS IV SOLN: INTRAVENOUS | Status: DC

## 2022-02-02 MED ORDER — LEVOTHYROXINE SODIUM 50 MCG PO TABS: 50.0000 ug | ORAL_TABLET | Freq: Every day | ORAL | Status: DC

## 2022-02-02 MED ORDER — DEXAMETHASONE SODIUM PHOSPHATE 10 MG/ML IJ SOLN
10.0000 mg | Freq: Two times a day (BID) | INTRAMUSCULAR | Status: DC
  Administered 2022-02-03 – 2022-02-04 (×3): 10 mg via INTRAVENOUS
  Filled 2022-02-02 (×3): qty 1

## 2022-02-02 MED ORDER — DORZOLAMIDE HCL-TIMOLOL MAL 2-0.5 % OP SOLN
1.0000 [drp] | Freq: Two times a day (BID) | OPHTHALMIC | Status: DC
  Administered 2022-02-02 – 2022-02-04 (×4): 1 [drp] via OPHTHALMIC
  Filled 2022-02-02: qty 10

## 2022-02-02 MED ORDER — ACETAMINOPHEN 650 MG RE SUPP: 650.0000 mg | Freq: Four times a day (QID) | RECTAL | Status: DC | PRN

## 2022-02-02 MED ORDER — FLUTICASONE FUROATE-VILANTEROL 200-25 MCG/ACT IN AEPB
1.0000 | INHALATION_SPRAY | Freq: Every day | RESPIRATORY_TRACT | Status: DC
  Administered 2022-02-04: 1 via RESPIRATORY_TRACT
  Filled 2022-02-02 (×2): qty 28

## 2022-02-02 MED ORDER — SODIUM CHLORIDE 0.9 % IV BOLUS
500.0000 mL | Freq: Once | INTRAVENOUS | Status: AC
Start: 2022-02-02 — End: 2022-02-02
  Administered 2022-02-02: 500 mL via INTRAVENOUS

## 2022-02-02 MED ORDER — ALBUTEROL SULFATE (2.5 MG/3ML) 0.083% IN NEBU: 3.0000 mL | INHALATION_SOLUTION | RESPIRATORY_TRACT | Status: DC | PRN

## 2022-02-02 MED ORDER — ONDANSETRON HCL 4 MG PO TABS: 4.0000 mg | ORAL_TABLET | Freq: Four times a day (QID) | ORAL | Status: DC | PRN

## 2022-02-02 MED ORDER — ACETAMINOPHEN 325 MG PO TABS
650.0000 mg | ORAL_TABLET | Freq: Four times a day (QID) | ORAL | Status: DC | PRN
  Administered 2022-02-02 – 2022-02-03 (×2): 650 mg via ORAL
  Filled 2022-02-02 (×2): qty 2

## 2022-02-02 MED ORDER — SODIUM CHLORIDE 0.9 % IV SOLN
1.0000 g | INTRAVENOUS | Status: DC
  Administered 2022-02-02 – 2022-02-03 (×2): 1 g via INTRAVENOUS
  Filled 2022-02-02 (×2): qty 10

## 2022-02-02 MED ORDER — DRONABINOL 2.5 MG PO CAPS
2.5000 mg | ORAL_CAPSULE | Freq: Two times a day (BID) | ORAL | Status: DC
  Administered 2022-02-03 (×2): 2.5 mg via ORAL
  Filled 2022-02-02 (×2): qty 1

## 2022-02-02 MED ORDER — ENOXAPARIN SODIUM 40 MG/0.4ML IJ SOSY
40.0000 mg | PREFILLED_SYRINGE | INTRAMUSCULAR | Status: DC
  Administered 2022-02-03: 40 mg via SUBCUTANEOUS
  Filled 2022-02-02: qty 0.4

## 2022-02-02 MED ORDER — ONDANSETRON HCL 4 MG/2ML IJ SOLN: 4.0000 mg | Freq: Four times a day (QID) | INTRAMUSCULAR | Status: DC | PRN

## 2022-02-02 MED ORDER — DEXAMETHASONE SODIUM PHOSPHATE 10 MG/ML IJ SOLN
10.0000 mg | Freq: Once | INTRAMUSCULAR | Status: AC
  Administered 2022-02-02: 10 mg via INTRAVENOUS
  Filled 2022-02-02: qty 1

## 2022-02-02 NOTE — Assessment & Plan Note (Addendum)
Known metastatic melanoma to lung. Oncology following ?Has been on  [place] in past. ?Appreciate Dr. Antonieta Pert consultation.  Will monitor recommendation. ?

## 2022-02-02 NOTE — Assessment & Plan Note (Addendum)
Loculated. ?Pt had pleural effusion with drainage tube a few months ago. Now as a loculated effusion but pt states she does not want another chest tube.  ?Pulmonology consulted.  CT chest ordered to further evaluate the effusion. ?

## 2022-02-02 NOTE — H&P (Addendum)
?History and Physical  ? ? ?Patient: Peggy Howard VZD:[id number] DOB: 1933/07/31 ?DOA: 02/02/2022 ?DOS: the patient was seen and examined on 02/02/2022 ?PCP: Pcp, No  ?Patient coming from: Home ? ?Chief Complaint:  ?Chief Complaint  ?Patient presents with  ? Emesis  ? Dizziness  ? ?HPI: Peggy Howard is a 88 y.o. 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  She has not been able to tolerate p.o. for intake secondary to nausea and vomiting.  She was seen by oncology a few days ago and was given Marinol to help with her appetite.  At the time she saw oncology she was not vomiting.  She has been able to ambulate and do minimal activities around the house but she has been more fatigued and tired.  She has been having urinary frequency for the last 2 to 3 days but no dysuria.  She has had to have drainage tubes in 2 her right lung for pleural effusion a few months ago.  Chest x-ray now shows a loculated pleural effusion.  She denies any abdominal pain, cramping or bowel habit changes.  She has not had any chest pain, palpitations, shortness of breath, cough, fever, chills.  Her son is at the bedside who has been helping take care of her.  He is a Administrator and leaving tomorrow morning for 2 days for a run.  His brother will be coming to the hospital to be with Peggy Howard.  ?Patient is hemodynamically stable and oxygenating well on room air. ? ?Review of Systems: As mentioned in the history of present illness. All other systems reviewed and are negative. ?Past Medical History:  ?Diagnosis Date  ? Arthritis   ? Goals of care, counseling/discussion 06/21/2021  ? Malignant pleural effusion 06/21/2021  ? Melanoma metastatic to brain [facility])   ? Melanoma metastatic to lung ([place]) 06/21/2021  ? Rectal prolapse   ? ?Past Surgical History:  ?Procedure  Laterality Date  ? ABDOMINAL HYSTERECTOMY    ? amputation of left finger     ? 2021  ? BIOPSY  03/27/2021  ? Procedure: BIOPSY;  Surgeon: Irving Copas., MD;  Location: [facility];  Service: Gastroenterology;;  ? COLONOSCOPY WITH PROPOFOL N/A 03/27/2021  ? Procedure: COLONOSCOPY WITH PROPOFOL;  Surgeon: Mansouraty, Telford Nab., MD;  Location: [facility];  Service: Gastroenterology;  Laterality: N/A;  ? JOINT REPLACEMENT    ? POLYPECTOMY  03/27/2021  ? Procedure: POLYPECTOMY;  Surgeon: Rush Landmark Telford Nab., MD;  Location: [facility];  Service: Gastroenterology;;  ? REMOVAL OF PLEURAL DRAINAGE CATHETER  09/02/2021  ? Procedure: REMOVAL OF PLEURAL DRAINAGE CATHETER;  Surgeon: Freddi Starr, MD;  Location: [facility];  Service: Pulmonary;;  ? REPLACEMENT TOTAL KNEE Bilateral   ? XI ROBOT ASSISTED RECTOPEXY N/A 06/12/2021  ? Procedure: XI ROBOT ASSISTED RECTOPEXY;  Surgeon: Leighton Ruff, MD;  Location: [facility];  Service: General;  Laterality: N/A;  ? ?Social History:  reports that she quit smoking about 46 years ago. Her smoking use included cigarettes. She has a 50.00 pack-year smoking history. She has never used smokeless tobacco. She reports current drug use. Drug: Benzodiazepines. She reports that she does not drink alcohol. ? ?Allergies  ?Allergen Reactions  ? Vitamin D Analogs Other (See Comments)  ?  "dizziness"  ? ? ?History reviewed. No pertinent family history. ? ?  Prior to Admission medications   ?Medication Sig Start Date End Date Taking? Authorizing Provider  ?acetaminophen (TYLENOL) 500 MG tablet Take 1,000 mg by mouth every 6 (six) hours as needed for mild pain, fever or headache. ?Patient not taking: Reported on 01/27/2022    [provider]  ?albuterol (VENTOLIN HFA) 108 (90 Base) MCG/ACT inhaler Inhale 2 puffs into the lungs every 6 (six) hours as needed for wheezing or shortness of breath. 06/16/21   Mercy Riding, MD  ?dorzolamide-timolol (COSOPT) 22.3-6.8 MG/ML  ophthalmic solution Place 1 drop into both eyes 2 (two) times daily. 01/02/22   [provider]  ?dronabinol (MARINOL) 2.5 MG capsule Take 1 capsule (2.5 mg total) by mouth 2 (two) times daily before lunch and supper. 01/28/22   Celso Amy, NP  ?fluticasone-salmeterol (ADVAIR DISKUS) 250-50 MCG/ACT AEPB Inhale 1 puff into the lungs in the morning and at bedtime. 06/17/21   Mercy Riding, MD  ?imatinib (GLEEVEC) 400 MG tablet TAKE 1 TABLET DAILY. TAKE WITH MEALS AND LARGE GLASS OF WATER. CAUTION CHEMOTHERAPY ?Patient not taking: Reported on 01/27/2022 01/17/22   Volanda Napoleon, MD  ?latanoprost (XALATAN) 0.005 % ophthalmic solution Place 1 drop into both eyes at bedtime. ?Patient not taking: Reported on 01/27/2022 01/02/22   [provider]  ?levothyroxine (SYNTHROID) 50 MCG tablet TAKE 1 TABLET(50 MCG) BY MOUTH DAILY BEFORE AND BREAKFAST 01/07/22   Volanda Napoleon, MD  ?Multiple Vitamins-Minerals (PRESERVISION AREDS 2 PO) Take 1 tablet by mouth 2 (two) times daily.    [provider]  ?NON FORMULARY Organic Protein 2 drinks/scoops a day    [provider]  ? ? ?Physical Exam: ?Vitals:  ? 02/02/22 1548 02/02/22 1759  ?BP: (!) 150/79 (!) 148/79  ?Pulse: 87 82  ?Resp: 18 17  ?Temp: 97.8 ?F (36.6 ?C)   ?TempSrc: Oral   ?SpO2: 94% 95%  ? ?General: WDWN, Alert and oriented x3.  ?Eyes: EOMI, PERRL, conjunctivae normal.  Sclera nonicteric ?HENT:  [place]/AT, external ears normal.  Nares patent without epistasis.  Mucous membranes are dry  ?Neck: Soft, normal range of motion, supple, no masses, Trachea midline ?Respiratory: Diminished breath sounds in RLL. Diffuse mild rales in other lung fields. no wheezing, Normal respiratory effort. ?Cardiovascular: Regular rate and rhythm, no murmurs / rubs / gallops. No extremity edema. 1+ pedal pulses.  ?Abdomen: Soft, no tenderness, nondistended, no rebound or guarding.  No masses palpated Bowel sounds normoactive ?Musculoskeletal: FROM. no cyanosis. No joint  deformity upper and lower extremities. no contractures. Normal muscle tone.  ?Skin: Warm, dry, intact no rashes ?Neurologic: CN 2-12 grossly intact.  Normal speech. Strength 5/5 in all extremities. Normal Gait.   ?Psychiatric: Normal judgment and insight. Normal mood.  ? ?Data Reviewed ?Lab Work:   WBC 6300 hemoglobin 11.8 hematocrit 36.3 platelets 242,000 sodium 136 potassium 4.0 chloride 101 bicarb 28 creatinine 0.66 BUN 30 glucose 119 LFTs normal.  Urinalysis yellow clear with small leukocyte esterase and positive for nitrite.  COVID-negative.  Influenza A and B negative ? ?Chest x-ray shows cardiomegaly with bilateral hilar fullness and a loculated right pleural effusion in the right lower lobe.  Peripheral nodular densities.  Patient has known metastatic melanoma to her lung ? ?Assessment and Plan: ?* Intractable nausea and vomiting ?Peggy Howard is admitted to Med Surg floor.  ?Zofran provided for nausea, vomiting.  ?IVF hydration with LR at 100 ml/hr ? ?Brain metastasis ([place]) ?Has known melanoma metastasis to brain and finished radiation therapy 2  weeks ago. ?Decadron IV Q 12hr ?Oncology consulted.  ? ?Melanoma metastatic to lung [facility]) ?Known metastatic melanoma to lung. Oncology following ?Has been on  [place] in past ? ?Malignant pleural effusion ?Pt had pleural effusion with drainage tube a few months ago. Now as a loculated effusion but pt states she does not want another chest tube.  ?Pulmonology consulted and will see in am to discuss options ? ?UTI (urinary tract infection) ?Treat with Rocephin as pt is vomiting and cannot tolerate PO intake ? ?Advance Care Planning:   Code Status:  DNR.  Lovenox for DVT prophylaxis. ? ?Consults: Pulmonology consulted by ER and will see in am ? ?Family Communication: Diagnosis and plan discussed with patient and her son who is at bedside.  They both verbalized understanding agree with plan.  Further recommendation to follow as clinically indicated ? ?Author: ?Eben Burow, MD ?02/02/2022 7:46 PM ? ?For on call review [email or web address].  ?

## 2022-02-02 NOTE — Assessment & Plan Note (Addendum)
With the dizziness. ?Has known melanoma metastasis to brain and finished radiation therapy 2 weeks ago. ?Decadron IV Q 8 hr. Oncology consulted.  ?MRI brain appears reassuring. ?

## 2022-02-02 NOTE — ED Triage Notes (Signed)
Pts son reports pt has been vomiting and having diarrhea over the past few days. Pt endorses dizziness and feeling generally unwell today. Pt recently completed radiation for brain metastasis about 2 weeks ago.  ?

## 2022-02-02 NOTE — Assessment & Plan Note (Addendum)
Unsure if the patient actually has UTI. ?Patient was started on IV ceftriaxone. ?In the setting of her presentation with nausea and vomiting with confusion we will treat for 3 days of antibiotics for cystitis. ?Changed to p.o. ?

## 2022-02-02 NOTE — Assessment & Plan Note (Addendum)
Presents with complaints of vomiting and diarrhea ongoing for last few days. ?Also had dizziness. ?Currently does not have any nausea vomiting.  No further diarrhea. ?Patient was given IV hydration.  Will monitor off the fluids. ?

## 2022-02-03 ENCOUNTER — Inpatient Hospital Stay (HOSPITAL_COMMUNITY): Payer: Medicare Other

## 2022-02-03 ENCOUNTER — Telehealth: Payer: Self-pay | Admitting: *Deleted

## 2022-02-03 DIAGNOSIS — D539 Nutritional anemia, unspecified: Secondary | ICD-10-CM | POA: Diagnosis present

## 2022-02-03 DIAGNOSIS — J910 Malignant pleural effusion: Secondary | ICD-10-CM | POA: Diagnosis not present

## 2022-02-03 DIAGNOSIS — C7801 Secondary malignant neoplasm of right lung: Secondary | ICD-10-CM

## 2022-02-03 DIAGNOSIS — R112 Nausea with vomiting, unspecified: Secondary | ICD-10-CM | POA: Diagnosis not present

## 2022-02-03 DIAGNOSIS — G936 Cerebral edema: Secondary | ICD-10-CM | POA: Diagnosis present

## 2022-02-03 DIAGNOSIS — E875 Hyperkalemia: Secondary | ICD-10-CM | POA: Diagnosis not present

## 2022-02-03 DIAGNOSIS — R42 Dizziness and giddiness: Secondary | ICD-10-CM

## 2022-02-03 LAB — BASIC METABOLIC PANEL
Anion gap: 8 (ref 5–15)
BUN: 22 mg/dL (ref 8–23)
CO2: 27 mmol/L (ref 22–32)
Calcium: 8.4 mg/dL — ABNORMAL LOW (ref 8.9–10.3)
Chloride: 103 mmol/L (ref 98–111)
Creatinine, Ser: 0.73 mg/dL (ref 0.44–1.00)
GFR, Estimated: >60 mL/min (ref >60)
Glucose, Bld: 196 mg/dL — ABNORMAL HIGH (ref 70–99)
Potassium: 5.3 mmol/L — ABNORMAL HIGH (ref 3.5–5.1)
Sodium: 138 mmol/L (ref 135–145)

## 2022-02-03 LAB — VITAMIN B12: Vitamin B-12: 2096 pg/mL — ABNORMAL HIGH (ref 180–914)

## 2022-02-03 LAB — CBC
HCT: 35.3 % — ABNORMAL LOW (ref 36.0–46.0)
Hemoglobin: 11.6 g/dL — ABNORMAL LOW (ref 12.0–15.0)
MCH: 33.1 pg (ref 26.0–34.0)
MCHC: 32.9 g/dL (ref 30.0–36.0)
MCV: 100.9 fL — ABNORMAL HIGH (ref 80.0–100.0)
Platelets: 213 10*3/uL (ref 150–400)
RBC: 3.5 MIL/uL — ABNORMAL LOW (ref 3.87–5.11)
RDW: 13.2 % (ref 11.5–15.5)
WBC: 3.3 10*3/uL — ABNORMAL LOW (ref 4.0–10.5)
nRBC: 0 % (ref 0.0–0.2)

## 2022-02-03 LAB — AMMONIA: Ammonia: <10 umol/L (ref 9–35)

## 2022-02-03 LAB — IRON AND TIBC
Iron: 64 ug/dL (ref 28–170)
Saturation Ratios: 21 % (ref 10.4–31.8)
TIBC: 299 ug/dL (ref 250–450)
UIBC: 235 ug/dL

## 2022-02-03 IMAGING — CT CT CHEST W/O CM
2 of 4 series · 15 of 36 positions shown, 18 images · non-contrast
Comparison: PET-CT [DATE].

CLINICAL DATA: Pleural mass Pleural effusion, malignancy suspected
malignant melanoma with loculated pleural effusion



[Series 2: thorax · axial · 0.76mm/px · z∈[-202,+50]mm · 12 of 150 slices shown, 15 images]
[im 12/150  mediastinal]
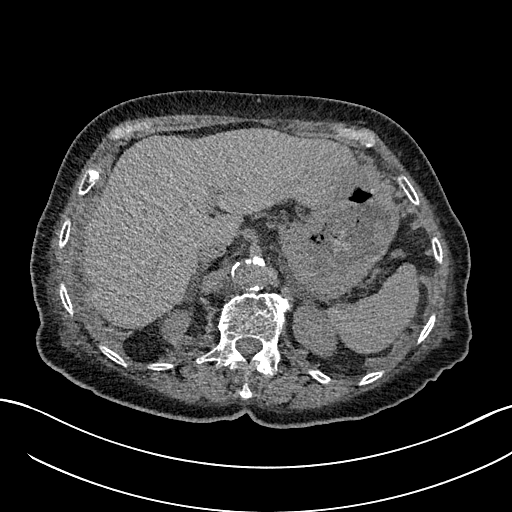
[im 12/150  lung]
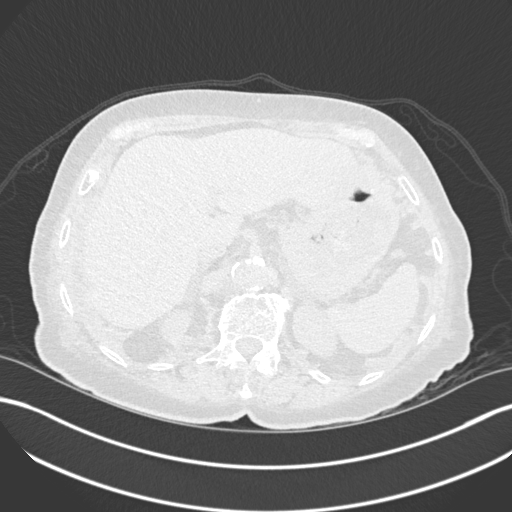
[im 23/150  lung]
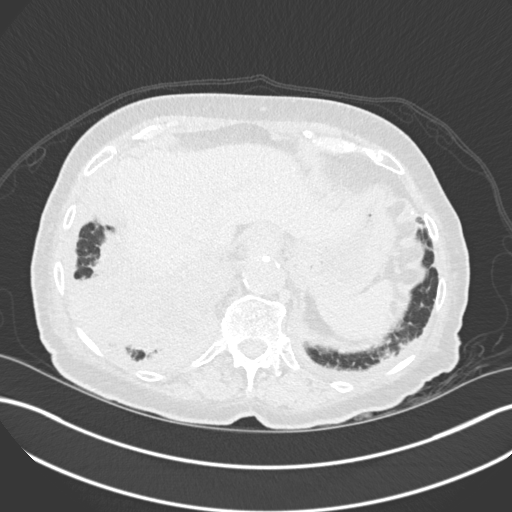
[im 35/150  lung]
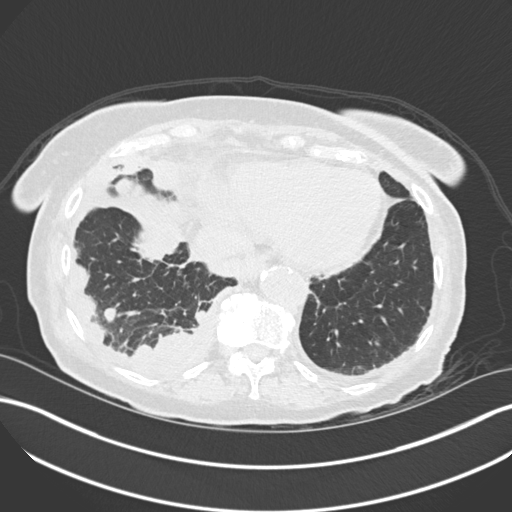
[im 46/150  lung]
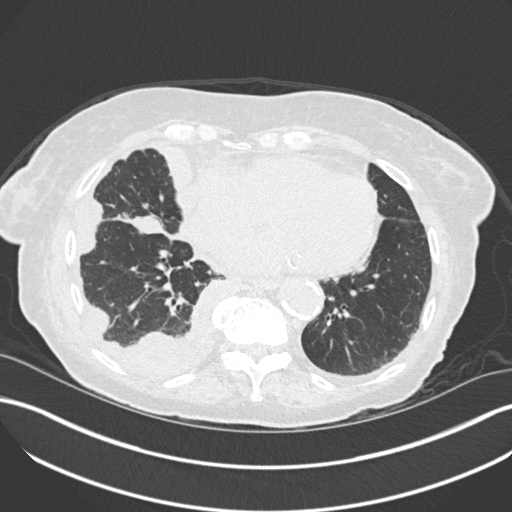
[im 58/150  mediastinal]
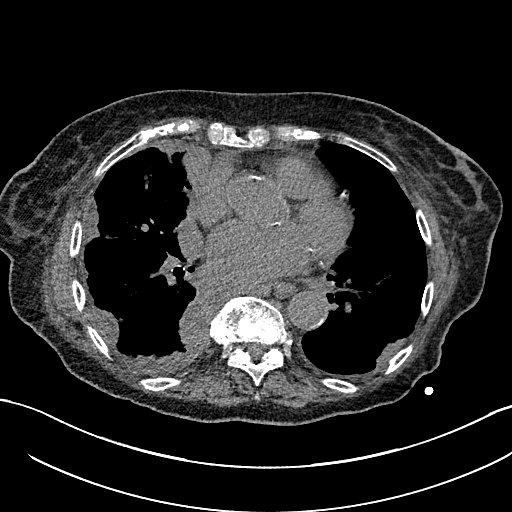
[im 58/150  lung]
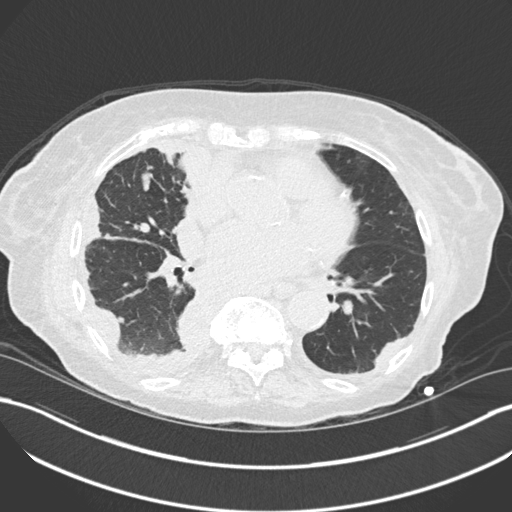
[im 69/150  lung]
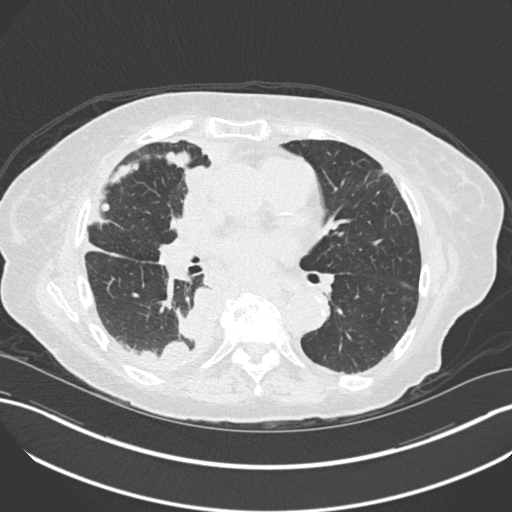
[im 81/150  lung]
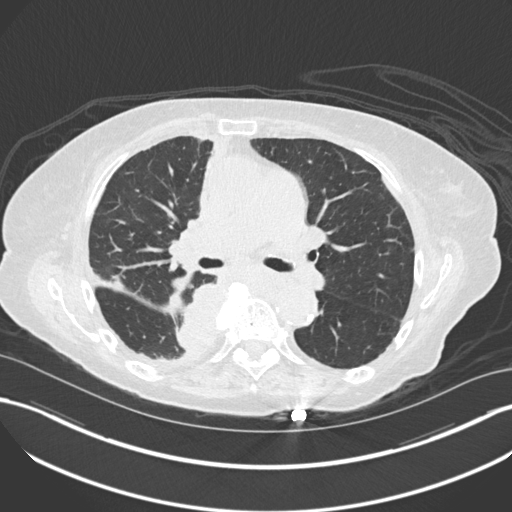
[im 92/150  lung]
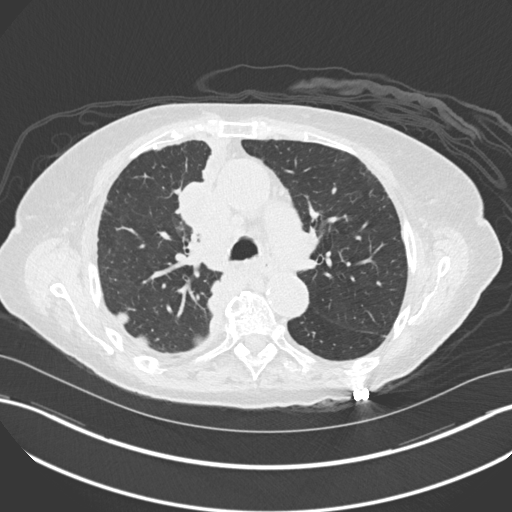
[im 104/150  mediastinal]
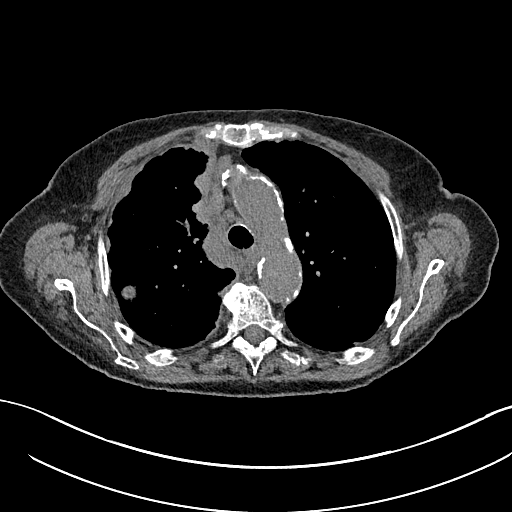
[im 104/150  lung]
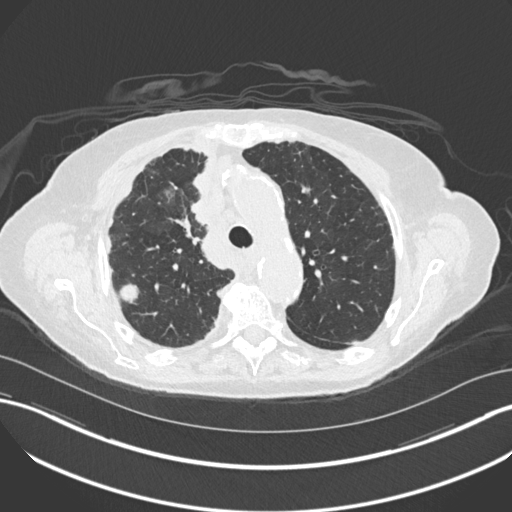
[im 115/150  lung]
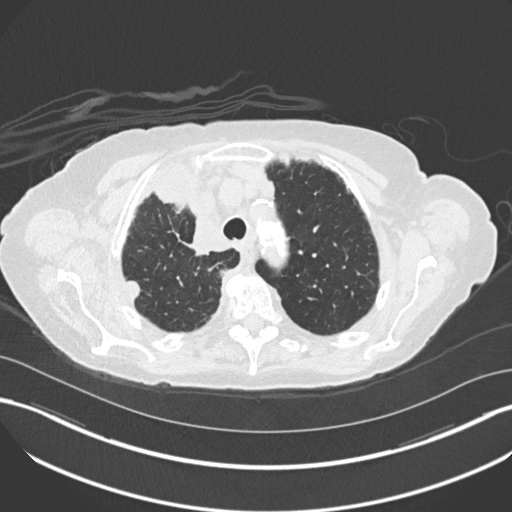
[im 127/150  lung]
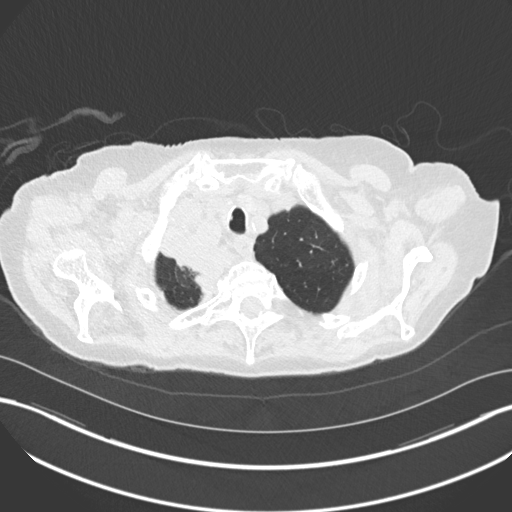
[im 138/150  lung]
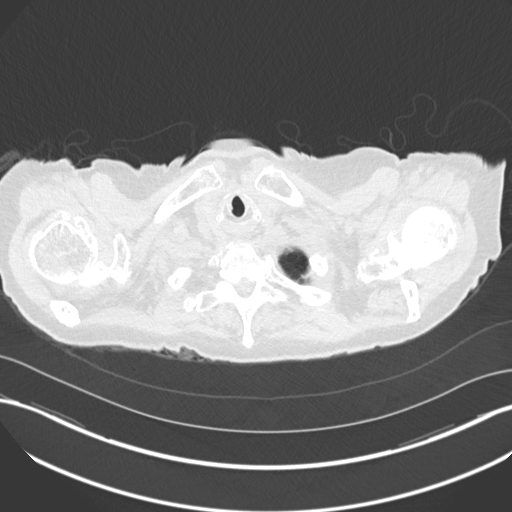

[Series 6: coronal · coronal · 0.60mm/px · 3 of 151 slices shown]
[im 31/151  lung]
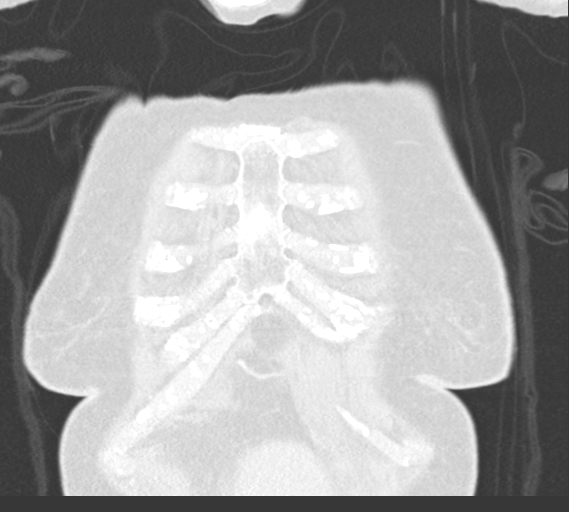
[im 61/151  lung]
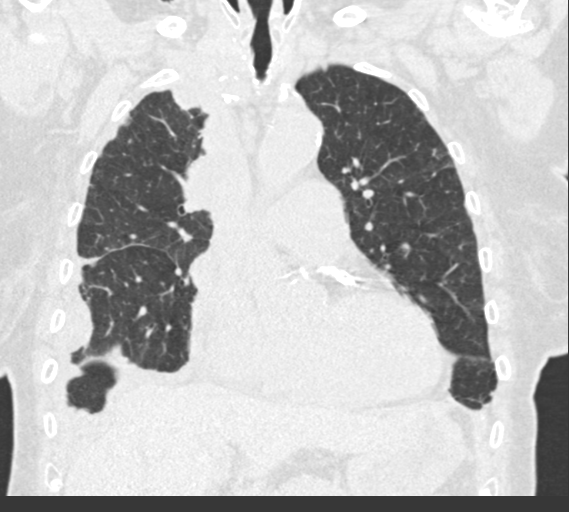
[im 91/151  lung]
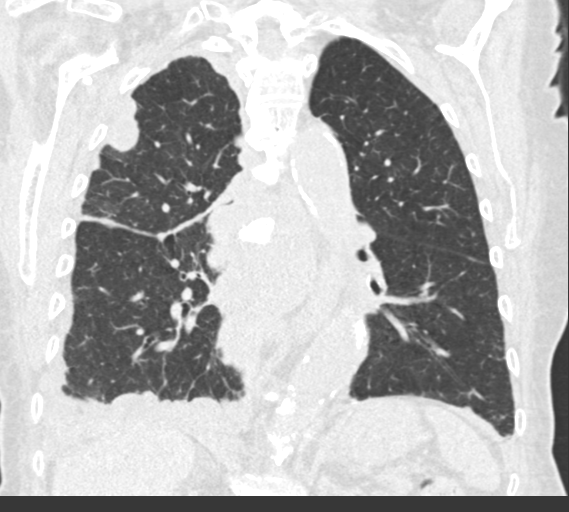

[15 of 36 positions shown; findings below may reference images not displayed]

FINDINGS: Cardiovascular: Normal cardiac size.Trace pericardial fluid.
Extensive coronary artery calcifications.Mildly enlarged main and
branch pulmonary arteries.Atherosclerosis of the thoracic aorta.

Mediastinum/Nodes: There are multiple prominent mediastinal lymph
nodes not significantly changed in size from recent PET-CT.

Lungs/Pleura:

Marked enlargement of the diffuse nodular circumferential pleural
thickening of the right hemithorax and the left lower lobe
subpleural nodule in comparison to prior exam. For reference:

-Right posteromedial nodule measures 5.3 x 3.8 cm, previously 4.1 x
1.9 cm (series 2, image 74).

-Right anterior pleural nodule measures 3.1 x 2.7 cm (series 2,
image 37), not previously measurable.

-Left lower lobe subpleural nodule is also enlarged, measuring 3.2 x
1.0 cm, previously 1.8 x 0.9 cm (series 2, image 96).

Enlargement of soft tissue density posterior to left atrium in the
mediastinum. New pulmonary nodule in the posterior lingula measuring
0.7 x 0.4 cm (series 5, image 68). Increased size of a left lower
lobe pulmonary nodule measuring up to 1.1 cm, previously 0.7 cm
(series 5, image 94).

Mild interlobular septal thickening and ground-glass bilaterally.
Small right and trace left pleural effusions.

Upper Abdomen: Unchanged left adrenal thickening. No acute
abnormality.

Musculoskeletal: No acute osseous abnormality. No suspicious lytic
or blastic lesions. Multilevel degenerative changes of the spine.
Bilateral shoulder osteoarthritis.No suspicious lytic or blastic
lesions.
IMPRESSION: Disease progression with marked enlargement of nodular
circumferential pleural mass in the right hemithorax. Increased size
of a left lower lobe pulmonary nodule and left posterior subpleural
nodule. New pulmonary nodule in the posterior lingula.

No significant change in mediastinal and hilar adenopathy.

Mild pulmonary edema.  Small right and trace left pleural effusions.

## 2022-02-03 IMAGING — MR MR HEAD WO/W CM
13 of 15 series · 41 of 48 positions shown · IV contrast (gadavist)
Comparison: [DATE]

CLINICAL DATA: Dizziness, persistent or recurrent.

EXAM:
MRI HEAD WITHOUT AND WITH CONTRAST
TECHNIQUE: Multiplanar, multiecho pulse sequences of the brain and surrounding
structures were obtained without and with intravenous contrast.
CONTRAST:  7mL GADAVIST GADOBUTROL 1 MMOL/ML IV SOLN

[Series 5: DWI · axial · 3.0mm · 1.36mm/px · z∈[-38,+102]mm · 7 of 96 slices shown (1 of 2)]
[im 1/96]
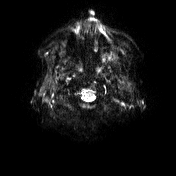
[im 16/96]
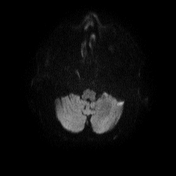
[im 32/96]
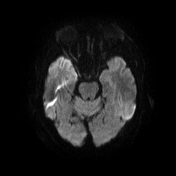
[im 48/96]
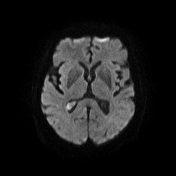
[im 64/96]
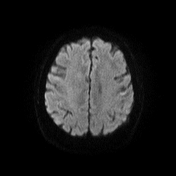
[im 80/96]
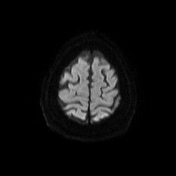
[im 96/96]
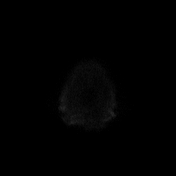

[Series 6: DWI · axial · 3.0mm · 1.36mm/px · z∈[-38,+102]mm · 4 of 48 slices shown (2 of 2)]
[im 1/48]
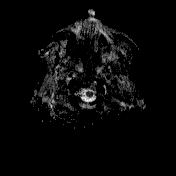
[im 16/48]
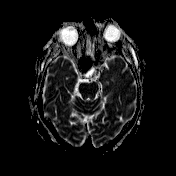
[im 32/48]
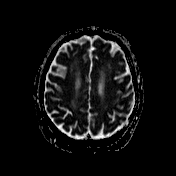
[im 48/48]
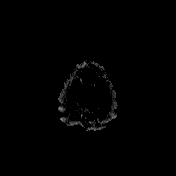

[Series 7: T1 · sagittal · 5.0mm · 0.75mm/px · 1 of 24 slices shown (1 of 4)]
[im 1/24]
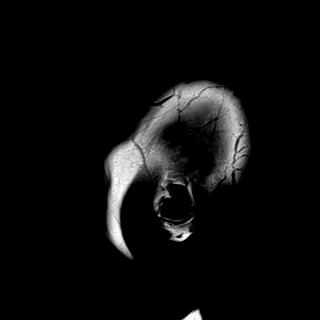

[Series 8: T2 · axial · 5.0mm · 0.62mm/px · 1 of 24 slices shown]
[im 1/24]
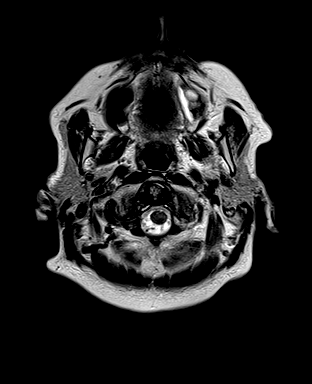

[Series 9: swi_images · axial · 3.0mm · 0.75mm/px · 1 of 52 slices shown]
[im 1/52]
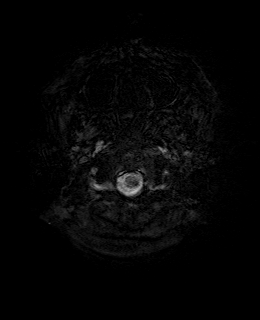

[Series 11: FLAIR · axial · 3.0mm · 0.75mm/px · z∈[-47,+105]mm · 3 of 52 slices shown]
[im 1/52]
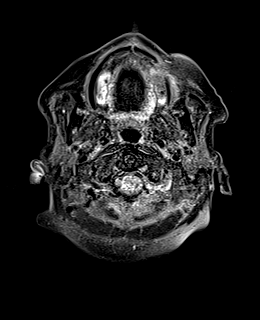
[im 26/52]
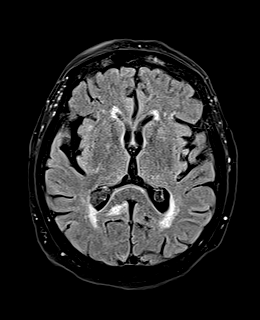
[im 52/52]
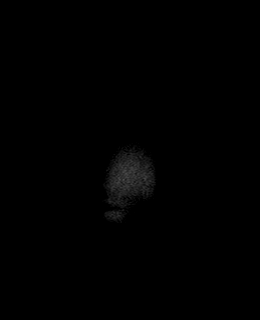

[Series 12: T1 · axial · 1.0mm · 0.94mm/px · z∈[-43,+99]mm · 8 of 144 slices shown (2 of 4)]
[im 1/144]
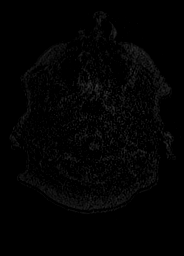
[im 18/144]
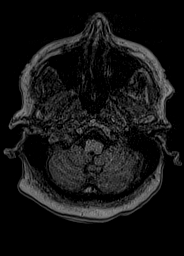
[im 36/144]
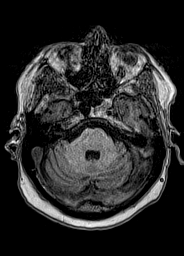
[im 54/144]
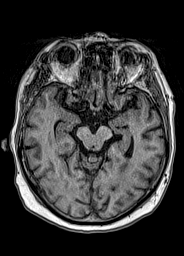
[im 90/144]
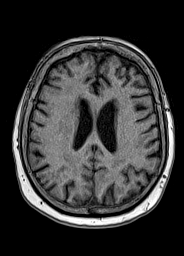
[im 108/144]
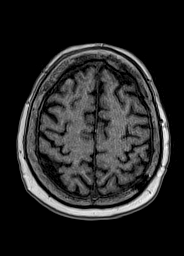
[im 126/144]
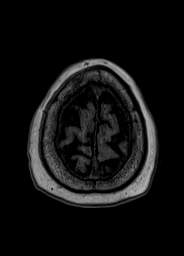
[im 144/144]
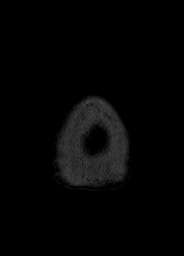

[Series 15: T2 post-contrast · coronal · 5.0mm · 0.69mm/px · 1 of 24 slices shown]
[im 1/24]
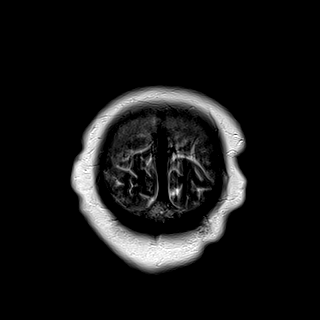

[Series 16: T1 post-contrast · axial · 1.0mm · 0.47mm/px · z∈[-43,+99]mm · 9 of 144 slices shown (1 of 3)]
[im 1/144]
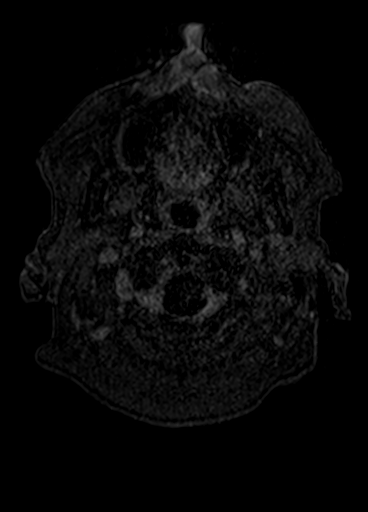
[im 18/144]
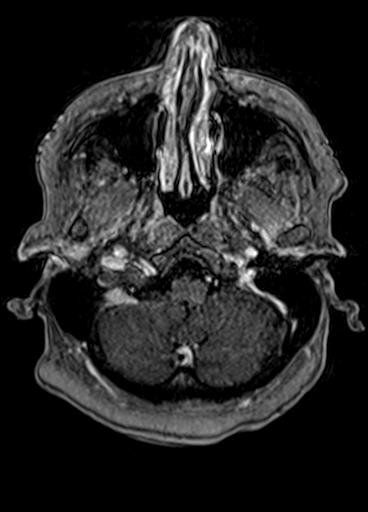
[im 36/144]
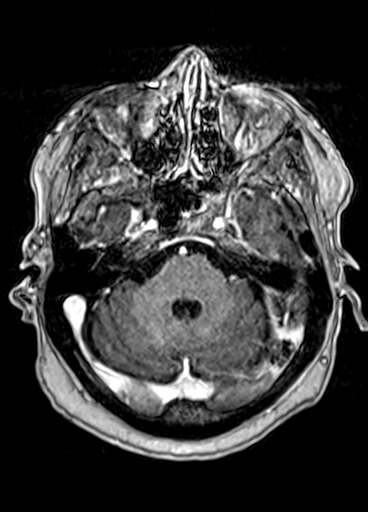
[im 54/144]
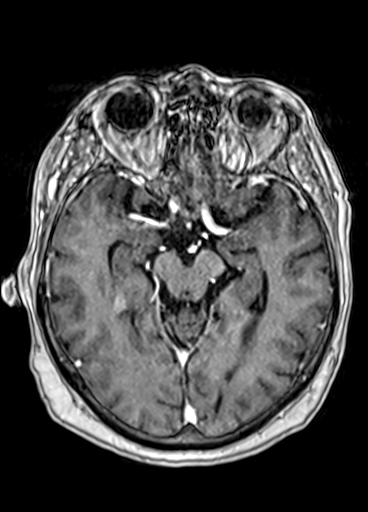
[im 72/144]
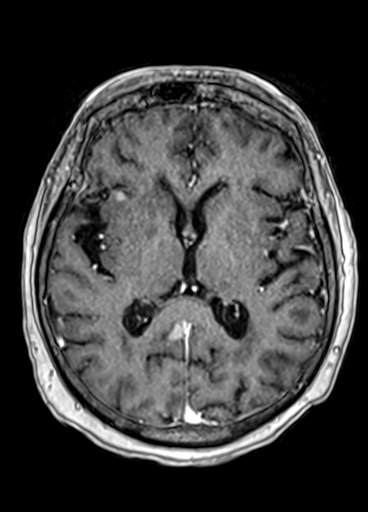
[im 90/144]
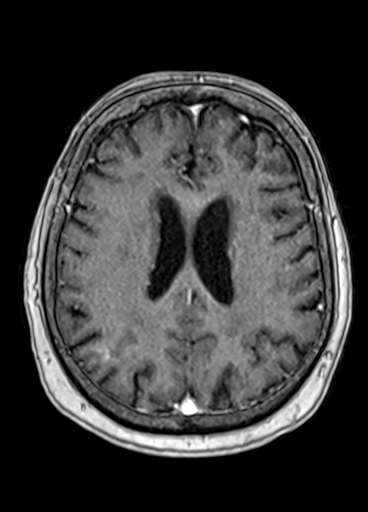
[im 108/144]
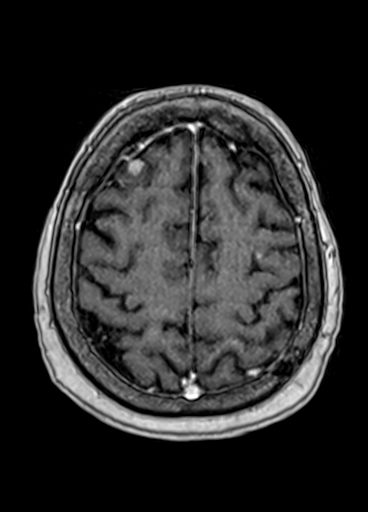
[im 126/144]
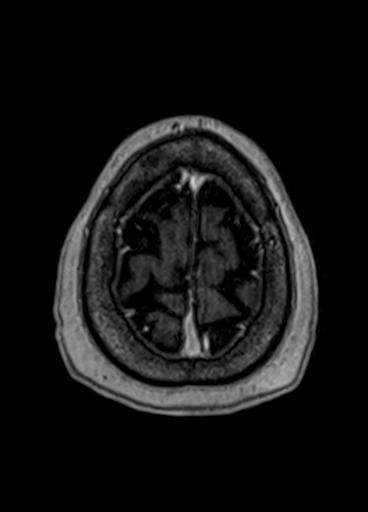
[im 144/144]
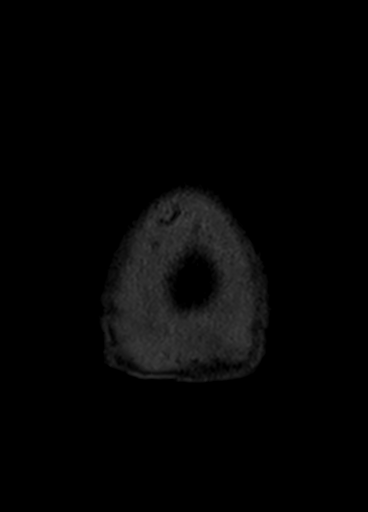

[Series 17: T1 · sagittal · 4.0mm · 0.47mm/px · 2 of 30 slices shown (3 of 4)]
[im 1/30]
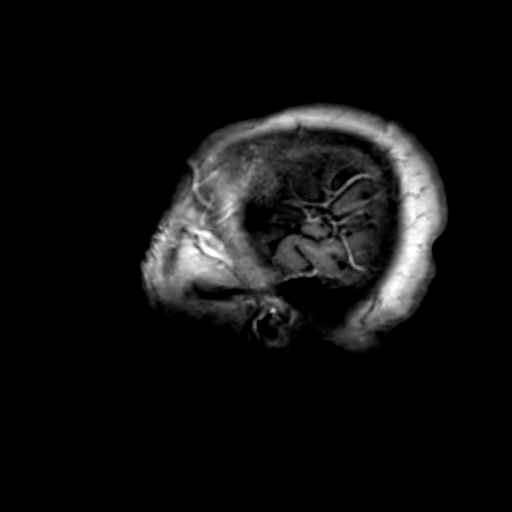
[im 30/30]
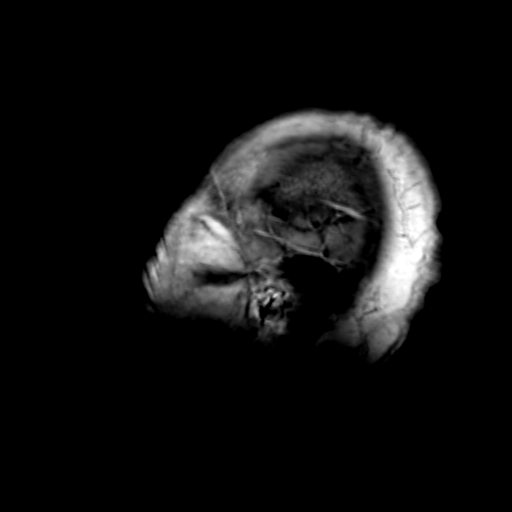

[Series 18: T1 · coronal · 4.0mm · 0.47mm/px · 2 of 36 slices shown (4 of 4)]
[im 1/36]
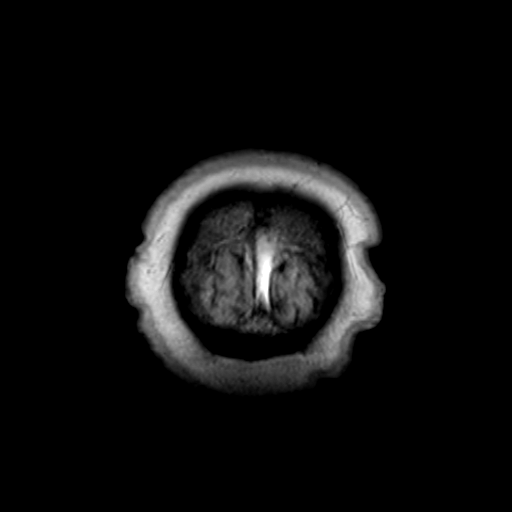
[im 36/36]
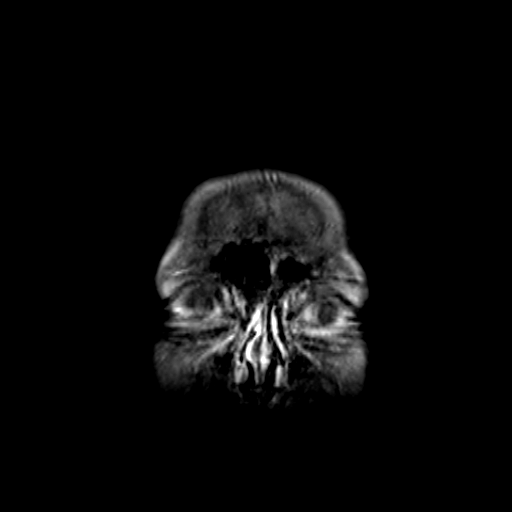

[Series 19: T1 post-contrast · coronal · 5.0mm · 0.43mm/px · 1 of 24 slices shown (2 of 3)]
[im 1/24]
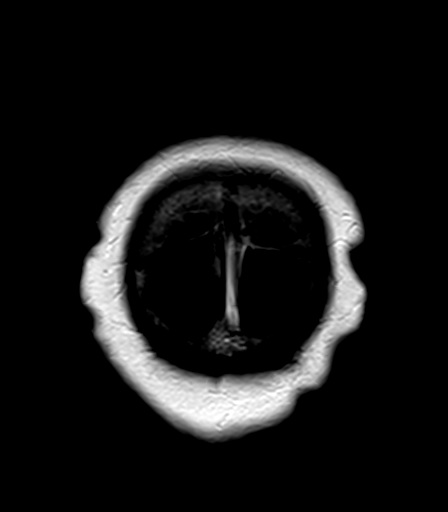

[Series 20: T1 post-contrast · sagittal · 5.0mm · 0.47mm/px · 1 of 24 slices shown (3 of 3)]
[im 1/24]
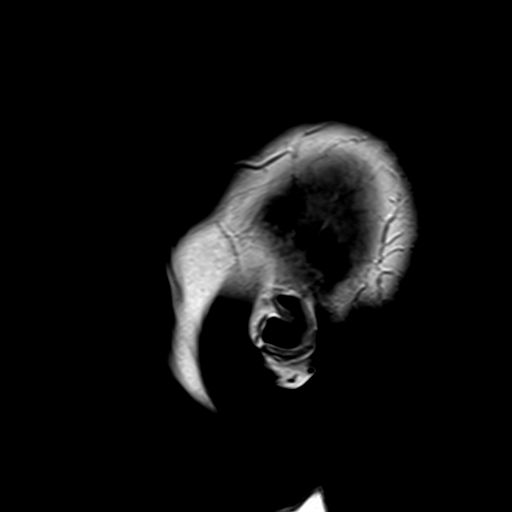

[41 of 48 positions shown; findings below may reference images not displayed]

FINDINGS: Brain: Numerous brain metastases as previously catalogued. Lesions
are less well seen due to motion artifact and blurring. Allowing for
differences in enhancement intensity and image clarity, no
worriesome change in lesion size.

Two notable lesions showing increased hemorrhage on gradient
imaging, specifically at the posterior left temporal lobe on [DATE]
at 14 mm and in the left lateral frontal lobe on [DATE] at 11 mm.
These lesions are associated with a mild increase in vasogenic
edema. No midline shift, acute infarct, hydrocephalus, or
collection. Chronic small vessel ischemia in the hemispheric white
matter

Vascular: Normal flow voids and vascular enhancements. Right
parietal developmental venous anomaly.

Skull and upper cervical spine: Scalloping the left parietal bone,
most consistent with arachnoid granulation. No focal marrow lesion.

Sinuses/Orbits: Negative
IMPRESSION: 1. Treated numerous brain metastases. No new lesion seen on this
motion degraded study.
2. The brain metastases are diffusely hemorrhagic. Left superior
temporal and left frontal metastasis show interval mild hemorrhage
with vasogenic edema but no worrisome mass effect.

## 2022-02-03 MED ORDER — GADOBUTROL 1 MMOL/ML IV SOLN
7.0000 mL | Freq: Once | INTRAVENOUS | Status: AC | PRN
  Administered 2022-02-03: 7 mL via INTRAVENOUS

## 2022-02-03 MED ORDER — MECLIZINE HCL 25 MG PO TABS
12.5000 mg | ORAL_TABLET | Freq: Three times a day (TID) | ORAL | Status: DC
  Administered 2022-02-03 – 2022-02-04 (×3): 12.5 mg via ORAL
  Filled 2022-02-03 (×3): qty 1

## 2022-02-03 NOTE — Assessment & Plan Note (Signed)
Mildly elevated. ?We will monitor for now. ?

## 2022-02-03 NOTE — Assessment & Plan Note (Signed)
No vertigo. ?No focal deficit at the time of my evaluation. ?MRI brain without any worrisome finding.  No new lesions.  Add meclizine.  On Decadron. ?

## 2022-02-03 NOTE — Consult Note (Signed)
? ?NAME:  Peggy Howard, MRN:  [id number], DOB:  December 09, 1932, LOS: 1 ?ADMISSION DATE:  02/02/2022, CONSULTATION DATE:  02/03/2022 ?REFERRING MD:  Dr. Tonie Griffith, [facility], CHIEF COMPLAINT:  Pleural effusion  ? ?History of Present Illness:  ?88 yo female former smoker presented to ER with vomiting, diarrhea for several days.  She is on therapy for metastatic melanoma and complted XRT to her brain 2 weeks prior to admission.  She had Rt thoracentesis in July 2022 and then had pleurx catheter placed which was then removed in October 2022.  CXR on this admission showed loculated Rt effusion.  PCCM asked to assess whether she needs repeat thoracentesis.  Previously followed by Dr. Valeta Harms in pulmonary office. ? ?Pertinent  Medical History  ?Metastatic melanoma, OA, Rectal prolapse, Hypothyroidism ? ?Studies:  ?CT angio chest 06/14/21 >> large Rt effusion, pleural thickening and nodularity, LLL nodule ?Rt thoracentesis 06/14/21 >> 1.4 liters bloody fluid,  glucose 140, LDH 340, protein 4, Nucleated cells 952 (72%L, 18%M, 8%N, 2%E), melanoma cells present ?PET scan 11/30/21 >> generalized improvement in nodular circumferential pleural thickening of Rt hemithorax with decreased soft tissue bulk, subpleural LLL nodule ? ?Interim History / Subjective:  ?She felt better after thoracentesis last Summer.  She had pleurx catheter but not came out and this was uncomfortable.  She denies chest pain, fever, hemoptysis.  Previously on advair, but didn't help. ? ?Objective   ?Blood pressure 131/75, pulse 71, temperature (!) 97.4 ?F (36.3 ?C), resp. rate 18, SpO2 93 %. ?   ?   ? ?Intake/Output Summary (Last 24 hours) at 02/03/2022 1146 ?Last data filed at 02/03/2022 0600 ?Gross per 24 hour  ?Intake 558.85 ml  ?Output 0 ml  ?Net 558.85 ml  ? ?There were no vitals filed for this visit. ? ?Examination: ? ?General - alert ?Eyes - pupils reactive ?ENT - no sinus tenderness, no stridor ?Cardiac - regular rate/rhythm, no murmur ?Chest - equal breath  sounds b/l, no wheezing or rales ?Abdomen - soft, non tender, + bowel sounds ?Extremities - no cyanosis, clubbing, or edema ?Skin - no rashes ?Neuro - normal strength, moves extremities, follows commands ?Psych - normal mood and behavior ? ? ?Assessment & Plan:  ? ?Loculated Rt pleural effusion on chest xray from 02/02/22. ?- will arrange for CT chest without contrast to better assess effusion ?- she is not having significant respiratory symptoms at this time ?- if effusion is small, then conservative approach might be best option ? ?Metastatic melanoma. ?- has brain mets s/p XRT ?- followed by Dr. Marin Olp with oncology ?- on Gleevac as outpt ? ?Goals of care. ?- DNR ? ?Labs   ?CBC: ?Recent Labs  ?Lab 02/02/22 ?1610 02/03/22 ?0332  ?WBC 6.3 3.3*  ?NEUTROABS 4.6  --   ?HGB 11.8* 11.6*  ?HCT 36.3 35.3*  ?MCV 101.1* 100.9*  ?PLT 242 213  ? ? ?Basic Metabolic Panel: ?Recent Labs  ?Lab 02/02/22 ?1610 02/03/22 ?0332  ?NA 136 138  ?K 4.0 5.3*  ?CL 101 103  ?CO2 28 27  ?GLUCOSE 119* 196*  ?BUN 30* 22  ?CREATININE 0.66 0.73  ?CALCIUM 8.5* 8.4*  ? ?GFR: ?Estimated Creatinine Clearance: 47.7 mL/min (by C-G formula based on SCr of 0.73 mg/dL). ?Recent Labs  ?Lab 02/02/22 ?1610 02/03/22 ?0332  ?WBC 6.3 3.3*  ? ? ?Liver Function Tests: ?Recent Labs  ?Lab 02/02/22 ?1610  ?AST 34  ?ALT 14  ?ALKPHOS 52  ?BILITOT 0.5  ?PROT 6.8  ?ALBUMIN 3.7  ? ?No results for  input(s): LIPASE, AMYLASE in the last 168 hours. ?No results for input(s): AMMONIA in the last 168 hours. ? ?ABG ?   ?Component Value Date/Time  ? HCO3 25.2 06/27/2021 0925  ? TCO2 26 06/27/2021 0939  ? O2SAT 62.9 06/27/2021 0925  ?  ? ?Coagulation Profile: ?No results for input(s): INR, PROTIME in the last 168 hours. ? ?Cardiac Enzymes: ?No results for input(s): CKTOTAL, CKMB, CKMBINDEX, TROPONINI in the last 168 hours. ? ?HbA1C: ?Hgb A1c MFr Bld  ?Date/Time Value Ref Range Status  ?06/27/2021 04:11 PM 6.2 (H) 4.8 - 5.6 % Final  ?  Comment:  ?  (NOTE) ?Pre diabetes:           5.7%-6.4% ? ?Diabetes:              >6.4% ? ?Glycemic control for   <7.0% ?adults with diabetes ?  ?07/16/2017 10:29 AM 6.2 (H) 4.8 - 5.6 % Final  ?  Comment:  ?           Prediabetes: 5.7 - 6.4 ?         Diabetes: >6.4 ?         Glycemic control for adults with diabetes: <7.0 ?  ? ? ?CBG: ?No results for input(s): GLUCAP in the last 168 hours. ? ?Review of Systems:   ?Reviewed and negative ? ?Past Medical History:  ?She,  has a past medical history of Arthritis, Goals of care, counseling/discussion (06/21/2021), Malignant pleural effusion (06/21/2021), Melanoma metastatic to brain [facility]), Melanoma metastatic to lung ([place]) (06/21/2021), and Rectal prolapse.  ? ?Surgical History:  ? ?Past Surgical History:  ?Procedure Laterality Date  ? ABDOMINAL HYSTERECTOMY    ? amputation of left finger     ? 2021  ? BIOPSY  03/27/2021  ? Procedure: BIOPSY;  Surgeon: Irving Copas., MD;  Location: [facility];  Service: Gastroenterology;;  ? COLONOSCOPY WITH PROPOFOL N/A 03/27/2021  ? Procedure: COLONOSCOPY WITH PROPOFOL;  Surgeon: Mansouraty, Telford Nab., MD;  Location: [facility];  Service: Gastroenterology;  Laterality: N/A;  ? JOINT REPLACEMENT    ? POLYPECTOMY  03/27/2021  ? Procedure: POLYPECTOMY;  Surgeon: Rush Landmark Telford Nab., MD;  Location: [facility];  Service: Gastroenterology;;  ? REMOVAL OF PLEURAL DRAINAGE CATHETER  09/02/2021  ? Procedure: REMOVAL OF PLEURAL DRAINAGE CATHETER;  Surgeon: Freddi Starr, MD;  Location: [facility];  Service: Pulmonary;;  ? REPLACEMENT TOTAL KNEE Bilateral   ? XI ROBOT ASSISTED RECTOPEXY N/A 06/12/2021  ? Procedure: XI ROBOT ASSISTED RECTOPEXY;  Surgeon: Leighton Ruff, MD;  Location: [facility];  Service: General;  Laterality: N/A;  ?  ? ?Social History:  ? reports that she quit smoking about 46 years ago. Her smoking use included cigarettes. She has a 50.00 pack-year smoking history. She has never used smokeless tobacco. She reports current drug use. Drug:  Benzodiazepines. She reports that she does not drink alcohol.  ? ?Family History:  ?Her family history is not on file.  ? ?Allergies ?Allergies  ?Allergen Reactions  ? Vitamin D Analogs Other (See Comments)  ?  "dizziness"  ?  ? ?Home Medications  ?Prior to Admission medications   ?Medication Sig Start Date End Date Taking? Authorizing Provider  ?acetaminophen (TYLENOL) 500 MG tablet Take 1,000 mg by mouth every 6 (six) hours as needed for mild pain, fever or headache.   Yes [provider]  ?dorzolamide-timolol (COSOPT) 22.3-6.8 MG/ML ophthalmic solution Place 1 drop into both eyes 2 (two) times daily. 01/02/22  Yes [provider]  ?  dronabinol (MARINOL) 2.5 MG capsule Take 1 capsule (2.5 mg total) by mouth 2 (two) times daily before lunch and supper. 01/28/22  Yes Celso Amy, NP  ?imatinib (GLEEVEC) 400 MG tablet TAKE 1 TABLET DAILY. TAKE WITH MEALS AND LARGE GLASS OF WATER. CAUTION CHEMOTHERAPY ?Patient taking differently: Take 400 mg by mouth daily. 01/17/22  Yes Volanda Napoleon, MD  ?latanoprost (XALATAN) 0.005 % ophthalmic solution Place 1 drop into both eyes at bedtime. 01/02/22  Yes [provider]  ?Multiple Vitamins-Minerals (PRESERVISION AREDS 2 PO) Take 1 tablet by mouth 2 (two) times daily.   Yes [provider]  ?vitamin E 1000 UNIT capsule Take 1,000 Units by mouth daily.   Yes [provider]  ?levothyroxine (SYNTHROID) 50 MCG tablet TAKE 1 TABLET(50 MCG) BY MOUTH DAILY BEFORE AND BREAKFAST ?Patient not taking: Reported on 02/02/2022 01/07/22   Volanda Napoleon, MD  ?  ? ?Signature:  ?Chesley Mires, MD ?[place] ?Pager - [phone] ?02/03/2022, 12:07 PM ? ? ? ? ? ?

## 2022-02-03 NOTE — Consult Note (Signed)
Referral MD  Reason for Referral: Nausea/vomiting, confusion.  Metastatic melanoma.  Status post cranial radiation therapy.  Chief Complaint  Patient presents with   Emesis   Dizziness  : I just got very dizzy.  HPI: Ms. Peggy Howard is well-known to me.  She is a very nice 88 year old [name] female.  She has metastatic melanoma.  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  She is on [place] which is working..  She has a CNS metastasis.  She recent completed radiation therapy to the brain for I think 21 small metastasis.  We did see her back a week or so ago.  We got her on some Marinol to try to help with her appetite.  She has complaining of some nausea and vomiting recently.  She had no diarrhea.  She had some dizziness.  Again she completed her radiation therapy probably about a month ago.  She has had no fever.  There is no bleeding.  She has had some urinary frequency.  She came to the hospital yesterday.  Her labs show white cell count 3.3.  Hemoglobin 11.6.  Platelet count is 213,000.  Her blood sugar was 196.  BUN 22 creatinine 0.73.  Her albumin is 3.7.  LFTs are okay.  Her sodium is 138.  She had an MRI of the brain this morning.  We do not have the results back yet.  She says she feels better.  She is getting some IV fluid.  She has had no rashes.  There is been no cough or shortness of breath.  She had a chest x-ray done which shows some nodular densities in the right upper lung.  Overall, she actually looks better than I would have thought.  I would have to say that her performance status is probably ECOG 1.    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   Melanoma metastatic to brain [facility])    Melanoma metastatic to lung ([place]) 06/21/2021   Rectal prolapse   :   Past Surgical History:  Procedure Laterality Date   ABDOMINAL HYSTERECTOMY     amputation of left  finger      2021   BIOPSY  03/27/2021   Procedure: BIOPSY;  Surgeon: Irving Copas., MD;  Location: [facility];  Service: Gastroenterology;;   COLONOSCOPY WITH PROPOFOL N/A 03/27/2021   Procedure: COLONOSCOPY WITH PROPOFOL;  Surgeon: Irving Copas., MD;  Location: [facility];  Service: Gastroenterology;  Laterality: N/A;   JOINT REPLACEMENT     POLYPECTOMY  03/27/2021   Procedure: POLYPECTOMY;  Surgeon: Mansouraty, Telford Nab., MD;  Location: [facility];  Service: Gastroenterology;;   REMOVAL OF PLEURAL DRAINAGE CATHETER  09/02/2021   Procedure: REMOVAL OF PLEURAL DRAINAGE CATHETER;  Surgeon: Freddi Starr, MD;  Location: [facility];  Service: Pulmonary;;   REPLACEMENT TOTAL KNEE Bilateral    XI ROBOT ASSISTED RECTOPEXY N/A 06/12/2021   Procedure: XI ROBOT ASSISTED RECTOPEXY;  Surgeon: Leighton Ruff, MD;  Location: [facility];  Service: General;  Laterality: N/A;  :   Current Facility-Administered Medications:    acetaminophen (TYLENOL) tablet 650 mg, 650 mg, Oral, Q6H PRN, 650 mg at 02/02/22 2213 **OR** acetaminophen (TYLENOL) suppository 650 mg, 650 mg, Rectal, Q6H PRN, Chotiner, Yevonne Aline, MD   albuterol (PROVENTIL) (2.5 MG/3ML) 0.083% nebulizer solution 3 mL, 3 mL, Inhalation, Q4H PRN, Chotiner, Yevonne Aline, MD   cefTRIAXone (ROCEPHIN) 1 g  in sodium chloride 0.9 % 100 mL IVPB, 1 g, Intravenous, Q24H, Chotiner, Yevonne Aline, MD, Last Rate: 200 mL/hr at 02/02/22 2220, 1 g at 02/02/22 2220   dexamethasone (DECADRON) injection 10 mg, 10 mg, Intravenous, Q12H, Chotiner, Yevonne Aline, MD   dorzolamide-timolol (COSOPT) 22.3-6.8 MG/ML ophthalmic solution 1 drop, 1 drop, Both Eyes, BID, Chotiner, Yevonne Aline, MD, 1 drop at 02/02/22 2214   dronabinol (MARINOL) capsule 2.5 mg, 2.5 mg, Oral, BID AC, Chotiner, Yevonne Aline, MD   enoxaparin (LOVENOX) injection 40 mg, 40 mg, Subcutaneous, Q24H, Chotiner, Yevonne Aline, MD   fluticasone furoate-vilanterol (BREO ELLIPTA) 200-25 MCG/ACT 1 puff,  1 puff, Inhalation, Daily, Chotiner, Yevonne Aline, MD   lactated ringers infusion, , Intravenous, Continuous, Chotiner, Yevonne Aline, MD, Last Rate: 100 mL/hr at 02/02/22 2156, New Bag at 02/02/22 2156   ondansetron (ZOFRAN) tablet 4 mg, 4 mg, Oral, Q6H PRN **OR** ondansetron (ZOFRAN) injection 4 mg, 4 mg, Intravenous, Q6H PRN, Chotiner, Yevonne Aline, MD:   dexamethasone (DECADRON) injection  10 mg Intravenous Q12H   dorzolamide-timolol  1 drop Both Eyes BID   dronabinol  2.5 mg Oral BID AC   enoxaparin (LOVENOX) injection  40 mg Subcutaneous Q24H   fluticasone furoate-vilanterol  1 puff Inhalation Daily  :   Allergies  Allergen Reactions   Vitamin D Analogs Other (See Comments)    "dizziness"  :  History reviewed. No pertinent family history.:   Social History   Socioeconomic History   Marital status: Widowed    Spouse name: Not on file   Number of children: Not on file   Years of education: Not on file   Highest education level: Not on file  Occupational History   Not on file  Tobacco Use   Smoking status: Former    Packs/day: 2.00    Years: 25.00    Pack years: 50.00    Types: Cigarettes    Quit date: 34    Years since quitting: 46.2   Smokeless tobacco: Never  Vaping Use   Vaping Use: Never used  Substance and Sexual Activity   Alcohol use: No   Drug use: Yes    Types: Benzodiazepines    Comment: Denies any use. 06/21/21   Sexual activity: Never  Other Topics Concern   Not on file  Social History Narrative   Not on file   Social Determinants of Health   Financial Resource Strain: Not on file  Food Insecurity: Not on file  Transportation Needs: Not on file  Physical Activity: Not on file  Stress: Not on file  Social Connections: Not on file  Intimate Partner Violence: Not on file  :  Review of Systems  Constitutional: Negative.   HENT:  Positive for hearing loss.   Eyes: Negative.   Respiratory: Negative.    Cardiovascular: Negative.    Gastrointestinal:  Positive for nausea and vomiting.  Genitourinary:  Positive for frequency.  Musculoskeletal: Negative.   Skin: Negative.   Neurological:  Positive for dizziness.  Endo/Heme/Allergies: Negative.   Psychiatric/Behavioral: Negative.      Exam: Patient Vitals for the past 24 hrs:  BP Temp Temp src Pulse Resp SpO2  02/03/22 0638 (!) 157/78 (!) 97.5 F (36.4 C) Oral 69 17 96 %  02/03/22 0153 (!) 152/72 98.5 F (36.9 C) Oral 74 16 94 %  02/02/22 2150 138/69 97.9 F (36.6 C) Oral -- 20 96 %  02/02/22 1759 (!) 148/79 -- -- 82 17 95 %  02/02/22 1548 (!) 150/79  97.8 F (36.6 C) Oral 87 18 94 %   Physical Exam Vitals reviewed.  HENT:     Head: Normocephalic and atraumatic.  Eyes:     Pupils: Pupils are equal, round, and reactive to light.  Cardiovascular:     Rate and Rhythm: Normal rate and regular rhythm.     Heart sounds: Normal heart sounds.  Pulmonary:     Effort: Pulmonary effort is normal.     Breath sounds: Normal breath sounds.  Abdominal:     General: Bowel sounds are normal.     Palpations: Abdomen is soft.  Musculoskeletal:        General: No tenderness or deformity. Normal range of motion.     Cervical back: Normal range of motion.  Lymphadenopathy:     Cervical: No cervical adenopathy.  Skin:    General: Skin is warm and dry.     Findings: No erythema or rash.  Neurological:     Mental Status: She is alert and oriented to person, place, and time.  Psychiatric:        Behavior: Behavior normal.        Thought Content: Thought content normal.        Judgment: Judgment normal.      Recent Labs    02/02/22 1610 02/03/22 0332  WBC 6.3 3.3*  HGB 11.8* 11.6*  HCT 36.3 35.3*  PLT 242 213    Recent Labs    02/02/22 1610 02/03/22 0332  NA 136 138  K 4.0 5.3*  CL 101 103  CO2 28 27  GLUCOSE 119* 196*  BUN 30* 22  CREATININE 0.66 0.73  CALCIUM 8.5* 8.4*    Blood smear review: None  Pathology: None    Assessment and Plan:  Ms. Vargus is a very nice 88 year old white female.  She had metastatic melanoma.  She has responded to treatment.  Her melanoma has a C-KIT mutation.  As such, Gleevec has worked well for her.  We have had no change in the Gleevec dosing.  In summary sound like she may have had some kind of viral syndrome.  Her white cell count is down to a little bit.  This may go along with a viral syndrome.  I know it would be hard to prove this.  The MRI of the brain will certainly be helpful.  I know we started Marinol on her.  I never had Marinol caused nausea or vomiting.  It could certainly cause dizziness.  Hopefully, all of this will resolve.  We will help follow along.  I really cannot think of anything that needs to be done right now.  Again the MRI of the brain will certainly be helpful.  Lattie Haw, MD  Proverbs 17:17

## 2022-02-03 NOTE — Progress Notes (Signed)
Transition of Care (TOC) Screening Note ? ?Patient Details  ?Name: Peggy Howard ?Date of Birth: September 20, 1933 ? ?Transition of Care (TOC) CM/SW Contact:    ?Sherie Don, LCSW ?Phone Number: ?02/03/2022, 10:32 AM ? ?Transition of Care Department [facility]) has reviewed patient and no TOC needs have been identified at this time. We will continue to monitor patient advancement through interdisciplinary progression rounds. If new patient transition needs arise, please place a TOC consult. ?

## 2022-02-03 NOTE — Hospital Course (Signed)
Peggy Howard is a 88 y.o. 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 ?Oncology and PCCM consulted. ?

## 2022-02-03 NOTE — Progress Notes (Signed)
?Progress Note ? ? ?Patient: Peggy Howard BDZ:[id number] DOB: 06/11/1933 DOA: 02/02/2022     Hospitalization day: 1 ?DOS: the patient was seen and examined on 02/03/2022 ? ?Brief hospital course: ? Peggy Howard is a 88 y.o. 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 ?Oncology and PCCM consulted. ? ?Assessment and Plan: ?* Intractable nausea and vomiting ?Presents with complaints of vomiting and diarrhea ongoing for last few days. ?Also had dizziness. ?Currently does not have any nausea vomiting.  No further diarrhea. ?Patient was given IV hydration.  Will monitor off the fluids. ? ?Vasogenic edema ([place]) ?Seen on the MRI with left superior temporal and left frontal metastasis without any mass effect. ?Also has some mild interval hemorrhage. ?Patient follows up with radiation oncology outpatient.  Management per oncology. ? ?Dizziness ?No vertigo. ?No focal deficit at the time of my evaluation. ?MRI brain without any worrisome finding.  No new lesions.  Add meclizine.  On Decadron. ? ?Brain metastasis ([place]) ?With the dizziness. ?Has known melanoma metastasis to brain and finished radiation therapy 2 weeks ago. ?Decadron IV Q 8 hr. Oncology consulted.  ?MRI brain appears reassuring. ? ?Melanoma metastatic to lung [facility]) ?Known metastatic melanoma to lung. Oncology following ?Has been on  [place] in past. ?Appreciate Dr. Antonieta Pert consultation.  Will monitor recommendation. ? ?Malignant pleural effusion ?Loculated. ?Pt had pleural effusion with drainage tube a few months ago. Now as a loculated effusion but pt states she does not want another chest tube.  ?Pulmonology consulted.  CT chest ordered to further evaluate the effusion. ? ?UTI (urinary tract infection) ?Unsure if the patient actually has UTI. ?Patient was started on IV  ceftriaxone. ?In the setting of her presentation with nausea and vomiting with confusion we will treat for 3 days of antibiotics for cystitis. ?Changed to p.o. ? ?Macrocytic anemia ?MCV more than 100.  Hemoglobin around 11. ?We will check iron level, T41, folic acid.  Normal TSH and free T4 recently checked. ? ?Hyperkalemia ?Mildly elevated. ?We will monitor for now. ? ?Subjective: No nausea no vomiting.  No diarrhea.  Still has dizziness.  No focal deficit.  No chest pain.  Eager to eat. ? ?Physical Exam: ?Vitals:  ? 02/03/22 0153 02/03/22 9622 02/03/22 0946 02/03/22 1220  ?BP: (!) 152/72 (!) 157/78 131/75 136/67  ?Pulse: 74 69 71 68  ?Resp: 16 17 18 18   ?Temp: 98.5 ?F (36.9 ?C) (!) 97.5 ?F (36.4 ?C) (!) 97.4 ?F (36.3 ?C) 97.7 ?F (36.5 ?C)  ?TempSrc: Oral Oral  Oral  ?SpO2: 94% 96% 93% 95%  ? ?General: Appear in mild distress; no visible Abnormal Neck Mass Or lumps, Conjunctiva normal ?Cardiovascular: S1 and S2 Present, no Murmur, ?Respiratory: good respiratory effort, Bilateral Air entry present and faint basal Crackles, no wheezes ?Abdomen: Bowel Sound present, nontender ?Extremities: no Pedal edema ?Neurology: alert and oriented to time, place, and person ?Gait not checked due to patient safety concerns  ? ?Data Reviewed: ?I have Reviewed nursing notes, Vitals, and Lab results since pt's last encounter. Pertinent lab results CBC and BMP ?I have ordered test including CBC and BMP, iron studies, B12 ?I have reviewed the last note from oncology,  ?I have discussed pt's care plan and test results with pulmonary.  ? ?Family Communication: None at bedside ? ?Disposition: ?Status is: Inpatient ?Remains inpatient appropriate because: Requiring  further work-up and therapy for dizziness with CNS metastasis and pleural effusion with loculation. ? ?Author: ?Berle Mull, MD ?02/03/2022 4:07 PM ? ?For on call review [email or web address]. ?

## 2022-02-03 NOTE — Assessment & Plan Note (Signed)
Seen on the MRI with left superior temporal and left frontal metastasis without any mass effect. ?Also has some mild interval hemorrhage. ?Patient follows up with radiation oncology outpatient.  Management per oncology. ?

## 2022-02-03 NOTE — Telephone Encounter (Signed)
Son Peggy Howard called to ask Dr Marin Olp his impression of the MRI.  Dr Marin Olp feels the scan is relatively stable but Radiation Oncology would be the ones to talk to about any action taken on the MRI.  Son understands and will have a family member going to see her today.   ?

## 2022-02-03 NOTE — Assessment & Plan Note (Signed)
MCV more than 100.  Hemoglobin around 11. ?We will check iron level, O32, folic acid.  Normal TSH and free T4 recently checked. ?

## 2022-02-04 ENCOUNTER — Telehealth: Payer: Self-pay

## 2022-02-04 DIAGNOSIS — R112 Nausea with vomiting, unspecified: Secondary | ICD-10-CM | POA: Diagnosis not present

## 2022-02-04 MED ORDER — DEXAMETHASONE 4 MG PO TABS: ORAL_TABLET | ORAL | 0 refills | Status: DC

## 2022-02-04 MED ORDER — MECLIZINE HCL 12.5 MG PO TABS: 12.5000 mg | ORAL_TABLET | Freq: Three times a day (TID) | ORAL | 0 refills | Status: DC | PRN

## 2022-02-04 MED ORDER — DEXAMETHASONE 4 MG PO TABS: 4.0000 mg | ORAL_TABLET | Freq: Three times a day (TID) | ORAL | Status: DC

## 2022-02-04 MED ORDER — ALBUTEROL SULFATE HFA 108 (90 BASE) MCG/ACT IN AERS: 2.0000 | INHALATION_SPRAY | Freq: Four times a day (QID) | RESPIRATORY_TRACT | 2 refills | Status: DC | PRN

## 2022-02-04 MED ORDER — CEPHALEXIN 500 MG PO CAPS: 500.0000 mg | ORAL_CAPSULE | Freq: Two times a day (BID) | ORAL | 0 refills | Status: AC

## 2022-02-04 MED ORDER — FLUTICASONE-SALMETEROL 250-50 MCG/ACT IN AEPB: 1.0000 | INHALATION_SPRAY | Freq: Two times a day (BID) | RESPIRATORY_TRACT | 1 refills | Status: DC

## 2022-02-04 NOTE — Progress Notes (Signed)
CT chest reviewed.  Shows progression of malignancy.  No significant pleural effusion.  Defer further management to oncology.  PCCM will sign off. ? ?D/w Dr. Posey Pronto. ? ?Chesley Mires, MD ?[place] ?Pager - [phone] ?02/04/2022, 9:20 AM ? ?

## 2022-02-04 NOTE — Telephone Encounter (Signed)
Pts son, Dannell called asking to speak with Dr.Ennever regardinig his moms lung cancer progression. Dr.Ennever called and spoke with Dannell to update him.  ?

## 2022-02-04 NOTE — Plan of Care (Signed)
?  Problem: Nutrition: ?Goal: Adequate nutrition will be maintained ?Outcome: Adequate for Discharge ?  ?Problem: Elimination: ?Goal: Will not experience complications related to bowel motility ?Outcome: Adequate for Discharge ?  ?Problem: Pain Managment: ?Goal: General experience of comfort will improve ?Outcome: Adequate for Discharge ?  ?

## 2022-02-04 NOTE — Progress Notes (Signed)
Unfortunately, looks like she does have progressive disease with respect to her lungs.  She had a CT scan done yesterday which seemed to show that there was progressive disease in the lungs.Marland Kitchen  MRI of the brain showed treated metastases that were showed some hemorrhagic nature.  Radiation Oncology did not think that this was going to be a problem for her. ? ?She feels better.  She is not having any cough or shortness of breath. ? ?I just hate the fact that she did not have that much of her response to the [place].  Her melanoma has the C-kit mutation.  Typically, Gleevec has been very effective for this.  I think the options would be to double the Gleevec dose.  We may be able to try something different.  I will have to think about this. ? ?She has had no pain.  She has had no nausea or vomiting.  She might be eating a little bit better. ? ?Her iron studies showed iron saturation of 21%.  Her vitamin B12 level was 2100. ? ?Her vital signs are pretty stable.  Temperature 98.4.  Pulse 65.  Blood pressure 131/72.  Her lungs do not sound all that bad.  She has decent air movement bilaterally.  Cardiac exam is regular rate and rhythm.  Abdomen is soft. ? ?Again, looks that she does have progressive disease in the lungs.  This is where she initially presented with her metastatic disease. ? ?Her last PET scan was done 2 months ago which showed an improvement in her metastatic disease on Gleevec. ? ?Hopefully, she will be able to go home soon.  Again I will have to figure out how we can try to help the metastatic disease. ? ?Lattie Haw, MD ? ?Psalms 37:24 ?

## 2022-02-05 NOTE — ED Provider Notes (Signed)
?[facility] 3 EAST GENERAL SURGERY ?Provider Note ? ? ?CSN: [id number] ?Arrival date & time: 02/02/22  1541 ? ?  ? ?History ? ?Chief Complaint  ?Patient presents with  ? Emesis  ? Dizziness  ? ? ?Peggy Howard is a 88 y.o. female. ? ?Pt has a past medical history of melanoma with metastatic disease to her lungs and brain. Pt is followed by Dr. Marin Olp.  Patient has had full brain radiation.  Patient was in remission for a short period of time but was recently diagnosed with multiple metastatic lesions to her brain.  Dr. Marin Olp recently started the patient on Marinol due to her reduced appetite.  Patient's son is present with her he reports patient has not been eating or drinking.  Patient reports multiple episodes of diarrhea and nausea.  Patient denies any fever or chills she denies any current chest pain.  Patient admits to some shortness of breath. ? ?The history is provided by the patient and a relative. No language interpreter was used.  ?Emesis ?Severity:  Mild ?Relieved by:  Nothing ?Worsened by:  Nothing ?Ineffective treatments:  None tried ?Associated symptoms: diarrhea   ?Associated symptoms: no abdominal pain   ?Risk factors: no sick contacts and no suspect food intake   ?Dizziness ?Associated symptoms: diarrhea, nausea and vomiting   ? ?  ? ?Home Medications ?Prior to Admission medications   ?Medication Sig Start Date End Date Taking? Authorizing Provider  ?acetaminophen (TYLENOL) 500 MG tablet Take 1,000 mg by mouth every 6 (six) hours as needed for mild pain, fever or headache.   Yes [provider]  ?cephALEXin (KEFLEX) 500 MG capsule Take 1 capsule (500 mg total) by mouth 2 (two) times daily for 4 days. 02/04/22 02/08/22 Yes Lavina Hamman, MD  ?dorzolamide-timolol (COSOPT) 22.3-6.8 MG/ML ophthalmic solution Place 1 drop into both eyes 2 (two) times daily. 01/02/22  Yes [provider]  ?dronabinol (MARINOL) 2.5 MG capsule Take 1 capsule (2.5 mg total) by mouth 2 (two) times daily  before lunch and supper. 01/28/22  Yes Celso Amy, NP  ?imatinib (GLEEVEC) 400 MG tablet TAKE 1 TABLET DAILY. TAKE WITH MEALS AND LARGE GLASS OF WATER. CAUTION CHEMOTHERAPY ?Patient taking differently: Take 400 mg by mouth daily. 01/17/22  Yes Volanda Napoleon, MD  ?latanoprost (XALATAN) 0.005 % ophthalmic solution Place 1 drop into both eyes at bedtime. 01/02/22  Yes [provider]  ?Multiple Vitamins-Minerals (PRESERVISION AREDS 2 PO) Take 1 tablet by mouth 2 (two) times daily.   Yes [provider]  ?vitamin E 1000 UNIT capsule Take 1,000 Units by mouth daily.   Yes [provider]  ?albuterol (VENTOLIN HFA) 108 (90 Base) MCG/ACT inhaler Inhale 2 puffs into the lungs every 6 (six) hours as needed for wheezing or shortness of breath. 02/04/22   Lavina Hamman, MD  ?dexamethasone (DECADRON) 4 MG tablet Take 1 tablet (4 mg total) by mouth 3 (three) times daily for 2 days, THEN 1 tablet (4 mg total) 2 (two) times daily for 2 days, THEN 1 tablet (4 mg total) daily. 02/04/22 03/10/22  Lavina Hamman, MD  ?fluticasone-salmeterol (ADVAIR DISKUS) 250-50 MCG/ACT AEPB Inhale 1 puff into the lungs in the morning and at bedtime. 02/04/22   Lavina Hamman, MD  ?levothyroxine (SYNTHROID) 50 MCG tablet TAKE 1 TABLET(50 MCG) BY MOUTH DAILY BEFORE AND BREAKFAST ?Patient not taking: Reported on 02/02/2022 01/07/22   Volanda Napoleon, MD  ?meclizine (ANTIVERT) 12.5 MG tablet Take 1 tablet (  12.5 mg total) by mouth 3 (three) times daily as needed for dizziness. 02/04/22   Lavina Hamman, MD  ?   ? ?Allergies    ?Vitamin d analogs   ? ?Review of Systems   ?Review of Systems  ?Gastrointestinal:  Positive for diarrhea, nausea and vomiting. Negative for abdominal pain.  ?Neurological:  Positive for dizziness.  ?All other systems reviewed and are negative. ? ?Physical Exam ?Updated Vital Signs ?BP 131/72 (BP Location: Right Arm)   Pulse 65   Temp 98.4 ?F (36.9 ?C) (Oral)   Resp 18   SpO2 92%  ?Physical  Exam ?Vitals reviewed.  ?HENT:  ?   Head: Normocephalic.  ?   Nose: Nose normal.  ?   Mouth/Throat:  ?   Mouth: Mucous membranes are moist.  ?Eyes:  ?   Pupils: Pupils are equal, round, and reactive to light.  ?Cardiovascular:  ?   Rate and Rhythm: Normal rate and regular rhythm.  ?Pulmonary:  ?   Effort: Pulmonary effort is normal.  ?Abdominal:  ?   General: Abdomen is flat.  ?Musculoskeletal:     ?   General: Normal range of motion.  ?   Cervical back: Normal range of motion.  ?Skin: ?   General: Skin is warm.  ?Neurological:  ?   General: No focal deficit present.  ?   Mental Status: She is alert.  ? ? ?ED Results / Procedures / Treatments   ?Labs ?(all labs ordered are listed, but only abnormal results are displayed) ?Labs Reviewed  ?CBC WITH DIFFERENTIAL/PLATELET - Abnormal; Notable for the following components:  ?    Result Value  ? RBC 3.59 (*)   ? Hemoglobin 11.8 (*)   ? MCV 101.1 (*)   ? All other components within normal limits  ?COMPREHENSIVE METABOLIC PANEL - Abnormal; Notable for the following components:  ? Glucose, Bld 119 (*)   ? BUN 30 (*)   ? Calcium 8.5 (*)   ? All other components within normal limits  ?URINALYSIS, ROUTINE W REFLEX MICROSCOPIC - Abnormal; Notable for the following components:  ? Nitrite POSITIVE (*)   ? Leukocytes,Ua SMALL (*)   ? All other components within normal limits  ?URINALYSIS, MICROSCOPIC (REFLEX) - Abnormal; Notable for the following components:  ? Bacteria, UA FEW (*)   ? All other components within normal limits  ?CBC - Abnormal; Notable for the following components:  ? WBC 3.3 (*)   ? RBC 3.50 (*)   ? Hemoglobin 11.6 (*)   ? HCT 35.3 (*)   ? MCV 100.9 (*)   ? All other components within normal limits  ?BASIC METABOLIC PANEL - Abnormal; Notable for the following components:  ? Potassium 5.3 (*)   ? Glucose, Bld 196 (*)   ? Calcium 8.4 (*)   ? All other components within normal limits  ?VITAMIN B12 - Abnormal; Notable for the following components:  ? Vitamin B-12 2,096  (*)   ? All other components within normal limits  ?RESP PANEL BY RT-PCR (FLU A&B, COVID) [id number]  ?IRON AND TIBC  ?AMMONIA  ? ? ?EKG ?None ? ?Radiology ?No results found. ? ?Procedures ?Procedures  ? ? ?Medications Ordered in ED ?Medications  ?sodium chloride 0.9 % bolus 500 mL (0 mLs Intravenous Stopped 02/02/22 1829)  ?dexamethasone (DECADRON) injection 10 mg (10 mg Intravenous Given 02/02/22 2024)  ?gadobutrol (GADAVIST) 1 MMOL/ML injection 7 mL (7 mLs Intravenous Contrast Given 02/03/22 0608)  ? ? ?ED Course/ Medical  Decision Making/ A&P ?  ?                        ?Medical Decision Making ?Pt complains of nausea, vomiting and diarrhea.   ? ?Amount and/or Complexity of Data Reviewed ?Labs: ordered. ?   Details: Labs ordered, reviewed and interpreted. ?Radiology: ordered and independent interpretation performed. Decision-making details documented in ED Course. ?   Details: Chest xray reviewed and shows hilar adenopathy and possible loculated effusions ?Discussion of management or test interpretation with external provider(s): I discussed pt with Dr. Tonie Griffith who advised pulmonary consult.  Pulmonary consulted and will see in am  ?II discussed pt with Dr. Burr Medico  Oncology.  She advised to give IV decadron and brain MRi.  ? ?Risk ?Prescription drug management. ?Decision regarding hospitalization. ? ? ? ? ? ? ? ? ? ? ?Final Clinical Impression(s) / ED Diagnoses ?Final diagnoses:  ?Nausea vomiting and diarrhea  ? ? ?Rx / DC Orders ?ED Discharge Orders   ? ?      Ordered  ?  albuterol (VENTOLIN HFA) 108 (90 Base) MCG/ACT inhaler  Every 6 hours PRN       ? 02/04/22 1100  ?  cephALEXin (KEFLEX) 500 MG capsule  2 times daily       ? 02/04/22 1100  ?  dexamethasone (DECADRON) 4 MG tablet       ? 02/04/22 1100  ?  fluticasone-salmeterol (ADVAIR DISKUS) 250-50 MCG/ACT AEPB  2 times daily       ? 02/04/22 1100  ?  meclizine (ANTIVERT) 12.5 MG tablet  3 times daily PRN       ? 02/04/22 1100  ?  Increase activity slowly       ?  02/04/22 1100  ?  Diet general       ? 02/04/22 1100  ?  Ambulatory referral to Hematology / Oncology       ? 02/04/22 1102  ? ?  ?  ? ?  ? ? ?  ?Fransico Meadow, [place] ?02/06/22 1322 ? ?  ?Carmin Muskrat, MD ?03/16/

## 2022-02-07 ENCOUNTER — Encounter: Payer: Self-pay | Admitting: *Deleted

## 2022-02-07 ENCOUNTER — Other Ambulatory Visit (HOSPITAL_COMMUNITY): Payer: Self-pay

## 2022-02-07 ENCOUNTER — Other Ambulatory Visit: Payer: Self-pay

## 2022-02-07 ENCOUNTER — Inpatient Hospital Stay: Payer: Medicare Other

## 2022-02-07 ENCOUNTER — Telehealth: Payer: Self-pay | Admitting: Pharmacist

## 2022-02-07 ENCOUNTER — Telehealth: Payer: Self-pay | Admitting: Pharmacy Technician

## 2022-02-07 ENCOUNTER — Inpatient Hospital Stay (HOSPITAL_BASED_OUTPATIENT_CLINIC_OR_DEPARTMENT_OTHER): Payer: Medicare Other | Admitting: Hematology & Oncology

## 2022-02-07 ENCOUNTER — Encounter: Payer: Self-pay | Admitting: Hematology & Oncology

## 2022-02-07 VITALS — BP 126/65 | HR 71 | Temp 97.7°F | Resp 19 | Ht 64.17 in | Wt 151.0 lb

## 2022-02-07 DIAGNOSIS — J90 Pleural effusion, not elsewhere classified: Secondary | ICD-10-CM | POA: Diagnosis not present

## 2022-02-07 DIAGNOSIS — C7801 Secondary malignant neoplasm of right lung: Secondary | ICD-10-CM

## 2022-02-07 DIAGNOSIS — K922 Gastrointestinal hemorrhage, unspecified: Secondary | ICD-10-CM

## 2022-02-07 DIAGNOSIS — C7931 Secondary malignant neoplasm of brain: Secondary | ICD-10-CM | POA: Diagnosis present

## 2022-02-07 DIAGNOSIS — C4362 Malignant melanoma of left upper limb, including shoulder: Secondary | ICD-10-CM | POA: Diagnosis present

## 2022-02-07 DIAGNOSIS — C7800 Secondary malignant neoplasm of unspecified lung: Secondary | ICD-10-CM

## 2022-02-07 DIAGNOSIS — Z79899 Other long term (current) drug therapy: Secondary | ICD-10-CM | POA: Diagnosis not present

## 2022-02-07 LAB — CMP (CANCER CENTER ONLY)
ALT: 26 U/L (ref 0–44)
AST: 29 U/L (ref 15–41)
Albumin: 3.9 g/dL (ref 3.5–5.0)
Alkaline Phosphatase: 53 U/L (ref 38–126)
Anion gap: 9 (ref 5–15)
BUN: 32 mg/dL — ABNORMAL HIGH (ref 8–23)
CO2: 28 mmol/L (ref 22–32)
Calcium: 9 mg/dL (ref 8.9–10.3)
Chloride: 103 mmol/L (ref 98–111)
Creatinine: 0.82 mg/dL (ref 0.44–1.00)
GFR, Estimated: >60 mL/min (ref >60)
Glucose, Bld: 121 mg/dL — ABNORMAL HIGH (ref 70–99)
Potassium: 4.8 mmol/L (ref 3.5–5.1)
Sodium: 140 mmol/L (ref 135–145)
Total Bilirubin: 0.7 mg/dL (ref 0.3–1.2)
Total Protein: 6.3 g/dL — ABNORMAL LOW (ref 6.5–8.1)

## 2022-02-07 LAB — CBC WITH DIFFERENTIAL (CANCER CENTER ONLY)
Abs Immature Granulocytes: 0.04 10*3/uL (ref 0.00–0.07)
Basophils Absolute: 0 10*3/uL (ref 0.0–0.1)
Basophils Relative: 0 %
Eosinophils Absolute: 0.1 10*3/uL (ref 0.0–0.5)
Eosinophils Relative: 1 %
HCT: 34.6 % — ABNORMAL LOW (ref 36.0–46.0)
Hemoglobin: 11.7 g/dL — ABNORMAL LOW (ref 12.0–15.0)
Immature Granulocytes: 1 %
Lymphocytes Relative: 16 %
Lymphs Abs: 1.3 10*3/uL (ref 0.7–4.0)
MCH: 33.9 pg (ref 26.0–34.0)
MCHC: 33.8 g/dL (ref 30.0–36.0)
MCV: 100.3 fL — ABNORMAL HIGH (ref 80.0–100.0)
Monocytes Absolute: 0.7 10*3/uL (ref 0.1–1.0)
Monocytes Relative: 8 %
Neutro Abs: 6.3 10*3/uL (ref 1.7–7.7)
Neutrophils Relative %: 74 %
Platelet Count: 296 10*3/uL (ref 150–400)
RBC: 3.45 MIL/uL — ABNORMAL LOW (ref 3.87–5.11)
RDW: 12.7 % (ref 11.5–15.5)
WBC Count: 8.4 10*3/uL (ref 4.0–10.5)
nRBC: 0 % (ref 0.0–0.2)

## 2022-02-07 LAB — LACTATE DEHYDROGENASE: LDH: 495 U/L — ABNORMAL HIGH (ref 98–192)

## 2022-02-07 MED ORDER — MECLIZINE HCL 25 MG PO TABS: 25.0000 mg | ORAL_TABLET | Freq: Three times a day (TID) | ORAL | 0 refills | Status: DC | PRN

## 2022-02-07 MED ORDER — NILOTINIB HCL 150 MG PO CAPS
300.0000 mg | ORAL_CAPSULE | Freq: Two times a day (BID) | ORAL | 4 refills | Status: DC
Start: 2022-02-07 — End: 2022-03-06
  Filled 2022-02-07: qty 120, 30d supply, fill #0
  Filled 2022-02-10: qty 112, 28d supply, fill #0

## 2022-02-07 NOTE — Discharge Summary (Signed)
?Physician Discharge Summary ?  ?Patient: Peggy Howard MRN: [id number] DOB: 06-26-1933  ?Admit date:     02/02/2022  ?Discharge date: 02/04/2022  ?Discharge Physician: Berle Mull  ?PCP: Pcp, No ? ?Recommendations at discharge: ? Follow up with PCP in 1 week ? ?Discharge Diagnoses: ?Principal Problem: ?  Intractable nausea and vomiting ?Active Problems: ?  Brain metastasis ([place]) ?  Dizziness ?  Vasogenic edema (HCC) ?  Melanoma metastatic to lung [facility]) ?  Malignant pleural effusion ?  UTI (urinary tract infection) ?  Hyperkalemia ?  Macrocytic anemia ? ? ?Hospital Course: ? Peggy Howard is a 88 y.o. 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 ?Oncology and PCCM consulted. ? ?Assessment and Plan: ?* Intractable nausea and vomiting ?Presents with complaints of vomiting and diarrhea ongoing for last few days. ?Also had dizziness. ?Currently does not have any nausea vomiting.  No further diarrhea. ?Patient was given IV hydration. Able to tolerate oral diet ? ?Vasogenic edema (HCC) ?Seen on the MRI with left superior temporal and left frontal metastasis without any mass effect. ?Also has some mild interval hemorrhage. ?Patient follows up with radiation oncology outpatient.  Management per oncology. Will provider decadron taper. ? ?Dizziness ?No vertigo. ?No focal deficit at the time of my evaluation. ?MRI brain without any worrisome finding.  No new lesions.  Add meclizine.  On Decadron. ? ?Brain metastasis ([place]) ?With the dizziness. ?Has known melanoma metastasis to brain and finished radiation therapy 2 weeks ago. ?Decadron on taper. Oncology consulted.  ?MRI brain appears reassuring. ? ?Melanoma metastatic to lung [facility]) ?Known metastatic melanoma to lung. Oncology following ?Has been on  [place] in past. ?Appreciate Dr. Antonieta Pert  consultation.  Will monitor recommendation. ? ?Malignant pleural effusion ?Loculated. ?Pt had pleural effusion with drainage tube a few months ago. Now as a loculated effusion but pt states she does not want another chest tube.  ?Pulmonology consulted.  CT chest ordered to further evaluate the effusion. ? ?UTI (urinary tract infection) ?Unsure if the patient actually has UTI. ?Patient was started on IV ceftriaxone. ?In the setting of her presentation with nausea and vomiting with confusion we will treat for 3 days of antibiotics for cystitis. ?Changed to p.o. ? ?Macrocytic anemia ?MCV more than 100.  Hemoglobin around 11. ?normal iron level, X54, folic acid.  Normal TSH and free T4 recently checked. ? ?Hyperkalemia ?Mildly elevated. ? ?Consultants: Oncology PCCM  ?Procedures performed:  ?none ?DISCHARGE MEDICATION: ?Allergies as of 02/04/2022   ? ?   Reactions  ? Vitamin D Analogs Other (See Comments)  ? "dizziness"  ? ?  ? ?  ?Medication List  ?  ? ?TAKE these medications   ? ?acetaminophen 500 MG tablet ?Commonly known as: TYLENOL ?Take 1,000 mg by mouth every 6 (six) hours as needed for mild pain, fever or headache. ?  ?albuterol 108 (90 Base) MCG/ACT inhaler ?Commonly known as: VENTOLIN HFA ?Inhale 2 puffs into the lungs every 6 (six) hours as needed for wheezing or shortness of breath. ?  ?cephALEXin 500 MG capsule ?Commonly known as: KEFLEX ?Take 1 capsule (500 mg total) by mouth 2 (two) times daily for 4 days. ?  ?dexamethasone 4 MG tablet ?Commonly known as: DECADRON ?Take 1 tablet (4 mg total) by mouth 3 (three) times daily for 2 days, THEN 1 tablet (4 mg  total) 2 (two) times daily for 2 days, THEN 1 tablet (4 mg total) daily. ?Start taking on: February 04, 2022 ?  ?dorzolamide-timolol 22.3-6.8 MG/ML ophthalmic solution ?Commonly known as: COSOPT ?Place 1 drop into both eyes 2 (two) times daily. ?  ?dronabinol 2.5 MG capsule ?Commonly known as: MARINOL ?Take 1 capsule (2.5 mg total) by mouth 2 (two) times daily  before lunch and supper. ?  ?fluticasone-salmeterol 250-50 MCG/ACT Aepb ?Commonly known as: Advair Diskus ?Inhale 1 puff into the lungs in the morning and at bedtime. ?  ?imatinib 400 MG tablet ?Commonly known as: GLEEVEC ?TAKE 1 TABLET DAILY. TAKE WITH MEALS AND LARGE GLASS OF WATER. CAUTION CHEMOTHERAPY ?What changed: See the new instructions. ?  ?latanoprost 0.005 % ophthalmic solution ?Commonly known as: XALATAN ?Place 1 drop into both eyes at bedtime. ?  ?levothyroxine 50 MCG tablet ?Commonly known as: SYNTHROID ?TAKE 1 TABLET(50 MCG) BY MOUTH DAILY BEFORE AND BREAKFAST ?  ?meclizine 12.5 MG tablet ?Commonly known as: ANTIVERT ?Take 1 tablet (12.5 mg total) by mouth 3 (three) times daily as needed for dizziness. ?  ?PRESERVISION AREDS 2 PO ?Take 1 tablet by mouth 2 (two) times daily. ?  ?vitamin E 1000 UNIT capsule ?Take 1,000 Units by mouth daily. ?  ? ?  ? ? Follow-up Information   ? ? Volanda Napoleon, MD. Schedule an appointment as soon as possible for a visit in 1 week(s).   ?Specialty: Oncology ?Contact information: ?[address] ?[address] ?[place] [address] ?[phone] ? ? ?  ?  ? ?  ?  ? ?  ? ?Disposition: Home ?Diet recommendation:  ?Discharge Diet Orders (From admission, onward)  ? ?  Start     Ordered  ? 02/04/22 0000  Diet general       ? 02/04/22 1100  ? ?  ?  ? ?  ? ?Discharge Exam: ?There were no vitals filed for this visit. ?General: Appear in mild distress; no visible Abnormal Neck Mass Or lumps, Conjunctiva normal ?Cardiovascular: S1 and S2 Present, no Murmur, ?Respiratory: good respiratory effort, Bilateral Air entry present and CTA, no Crackles, no wheezes ?Abdomen: Bowel Sound present  ?Extremities: no Pedal edema ?Neurology: alert and oriented to time, place, and person ?Gait not checked due to patient safety concerns  ? ?Condition at discharge: good ? ?The results of significant diagnostics from this hospitalization (including imaging, microbiology, ancillary and laboratory)  are listed below for reference.  ? ?Imaging Studies: ?CT CHEST WO CONTRAST ? ?Result Date: 02/03/2022 ?CLINICAL DATA:  Pleural mass Pleural effusion, malignancy suspected malignant melanoma with loculated pleural effusion EXAM: CT CHEST WITHOUT CONTRAST TECHNIQUE: Multidetector CT imaging of the chest was performed following the standard protocol without IV contrast. RADIATION DOSE REDUCTION: This exam was performed according to the departmental dose-optimization program which includes automated exposure control, adjustment of the mA and/or kV according to patient size and/or use of iterative reconstruction technique. COMPARISON:  PET-CT 11/29/2021. FINDINGS: Cardiovascular: Normal cardiac size.Trace pericardial fluid. Extensive coronary artery calcifications.Mildly enlarged main and branch pulmonary arteries.Atherosclerosis of the thoracic aorta. Mediastinum/Nodes: There are multiple prominent mediastinal lymph nodes not significantly changed in size from recent PET-CT. Lungs/Pleura: Marked enlargement of the diffuse nodular circumferential pleural thickening of the right hemithorax and the left lower lobe subpleural nodule in comparison to prior exam. For reference: -Right posteromedial nodule measures 5.3 x 3.8 cm, previously 4.1 x 1.9 cm (series 2, image 74). -Right anterior pleural nodule measures 3.1 x 2.7 cm (series  2, image 37), not previously measurable. -Left lower lobe subpleural nodule is also enlarged, measuring 3.2 x 1.0 cm, previously 1.8 x 0.9 cm (series 2, image 96). Enlargement of soft tissue density posterior to left atrium in the mediastinum. New pulmonary nodule in the posterior lingula measuring 0.7 x 0.4 cm (series 5, image 68). Increased size of a left lower lobe pulmonary nodule measuring up to 1.1 cm, previously 0.7 cm (series 5, image 94). Mild interlobular septal thickening and ground-glass bilaterally. Small right and trace left pleural effusions. Upper Abdomen: Unchanged left adrenal  thickening. No acute abnormality. Musculoskeletal: No acute osseous abnormality. No suspicious lytic or blastic lesions. Multilevel degenerative changes of the spine. Bilateral shoulder osteoarthritis.No suspici

## 2022-02-07 NOTE — Progress Notes (Signed)
?Hematology and Oncology Follow Up Visit ? ?Peggy Howard ?[id number] ?[birth year] 88 y.o. ?02/07/2022 ? ? ?Principle Diagnosis:  ?Recurrent melanoma-right pleural effusion  -- BRAFwt/ c-Kit (+) ? ?Current Therapy:   ?Keytruda 200 mg IV q 3 week -- s/p cycle #1 -- d/c on 08/14/2021 for progression ?SBRT to CNS metastasis on 09/13/2021 ?Gleevec 400 mg po q day -- start on 08/19/2021 -- d/c on 01/31/2022 ?WBRT -- completed on 01/14/2022 ?Nilotinib 300 mg po BID -- start on 02/14/2022 ?    ?Interim History:  Peggy Howard is back for follow-up.  Unfortunately, she was recently hospitalized.  She was not feeling well.  She was having problems with headache.  She is having problems with nausea and vomiting.  She had MRI of the brain.  This showed the hemorrhagic brain mets but these were treated.  She was presented at the [facility].  They did not feel that this was a problem for her. ? ?She did have a CT scan of the chest.  This unfortunately did show that she had progressive disease. ? ?I just hate that we did not get more effect out of the [place].  She been on it for about 6 months. ? ?There is still some dizziness.  She went home.  She was home a couple days.  She now is back to see [name].  She still has some dizziness.  She stopped the Decadron all of a sudden.  I told her to get back onto Decadron at 4 mg a day.   ? ?We did do a rectal on her.  There may have been some question of her having some GI bleeding.  Thankfully, the rectal exam did not show any blood.  She did have some hemorrhoids. ? ?I did tell her to take 40 mg of Pepcid twice a day.  She will take over-the-counter Pepcid. ? ?I think the next option for [name] will be nilotinib.  I think this would be a reasonable choice.  I think she could tolerate this.  I would put her on 300 mg p.o. twice daily. ? ?She has had no increased cough or shortness of breath.  There is no productive cough. ? ?She is urinating okay. ? ?There is no leg  swelling. ? ?There is no double vision or blurred vision. ? ?Overall, I would say performance status is ECOG 1.   ? ? ?Medications:  ?Current Outpatient Medications:  ?  acetaminophen (TYLENOL) 500 MG tablet, Take 1,000 mg by mouth every 6 (six) hours as needed for mild pain, fever or headache., Disp: , Rfl:  ?  albuterol (VENTOLIN HFA) 108 (90 Base) MCG/ACT inhaler, Inhale 2 puffs into the lungs every 6 (six) hours as needed for wheezing or shortness of breath., Disp: 8 g, Rfl: 2 ?  cephALEXin (KEFLEX) 500 MG capsule, Take 1 capsule (500 mg total) by mouth 2 (two) times daily for 4 days., Disp: 8 capsule, Rfl: 0 ?  dexamethasone (DECADRON) 4 MG tablet, Take 1 tablet (4 mg total) by mouth 3 (three) times daily for 2 days, THEN 1 tablet (4 mg total) 2 (two) times daily for 2 days, THEN 1 tablet (4 mg total) daily., Disp: 40 tablet, Rfl: 0 ?  dorzolamide-timolol (COSOPT) 22.3-6.8 MG/ML ophthalmic solution, Place 1 drop into both eyes 2 (two) times daily., Disp: , Rfl:  ?  dronabinol (MARINOL) 2.5 MG capsule, Take 1 capsule (2.5 mg total) by mouth 2 (two) times daily before lunch and supper., Disp: 60  capsule, Rfl: 0 ?  fluticasone-salmeterol (ADVAIR DISKUS) 250-50 MCG/ACT AEPB, Inhale 1 puff into the lungs in the morning and at bedtime., Disp: 1 each, Rfl: 1 ?  latanoprost (XALATAN) 0.005 % ophthalmic solution, Place 1 drop into both eyes at bedtime., Disp: , Rfl:  ?  meclizine (ANTIVERT) 12.5 MG tablet, Take 1 tablet (12.5 mg total) by mouth 3 (three) times daily as needed for dizziness., Disp: 30 tablet, Rfl: 0 ?  Multiple Vitamins-Minerals (PRESERVISION AREDS 2 PO), Take 1 tablet by mouth 2 (two) times daily., Disp: , Rfl:  ?  nilotinib (TASIGNA) 150 MG capsule, Take 2 capsules (300 mg total) by mouth every 12 (twelve) hours. Take on an empty stomach, 1 hr before or 2 hrs after food., Disp: 120 capsule, Rfl: 4 ?  vitamin E 1000 UNIT capsule, Take 1,000 Units by mouth daily., Disp: , Rfl:  ?  levothyroxine  (SYNTHROID) 50 MCG tablet, TAKE 1 TABLET(50 MCG) BY MOUTH DAILY BEFORE AND BREAKFAST (Patient not taking: Reported on 02/02/2022), Disp: 90 tablet, Rfl: 3 ? ?Allergies:  ?Allergies  ?Allergen Reactions  ? Vitamin D Analogs Other (See Comments)  ?  "dizziness"  ? ? ?Past Medical History, Surgical history, Social history, and Family History were reviewed and updated. ? ?Review of Systems: ?Review of Systems  ?Constitutional: Negative.   ?HENT:  Negative.    ?Eyes: Negative.   ?Respiratory:  Positive for shortness of breath.   ?Cardiovascular: Negative.   ?Gastrointestinal: Negative.   ?Endocrine: Negative.   ?Genitourinary: Negative.    ?Musculoskeletal: Negative.   ?Skin: Negative.   ?Neurological: Negative.   ?Psychiatric/Behavioral: Negative.    ? ?Physical Exam: ? height is 5' 4.17" (1.63 m) and weight is 68.5 kg. Her oral temperature is 97.7 ?F (36.5 ?C). Her blood pressure is 126/65 and her pulse is 71. Her respiration is 19 and oxygen saturation is 100%.  ? ?Wt Readings from Last 3 Encounters:  ?02/07/22 68.5 kg  ?01/27/22 67.6 kg  ?01/14/22 70 kg  ? ? ?Physical Exam ?Vitals reviewed.  ?HENT:  ?   Head: Normocephalic and atraumatic.  ?Eyes:  ?   Pupils: Pupils are equal, round, and reactive to light.  ?Cardiovascular:  ?   Rate and Rhythm: Normal rate and regular rhythm.  ?   Heart sounds: Normal heart sounds.  ?Pulmonary:  ?   Effort: Pulmonary effort is normal.  ?   Breath sounds: Normal breath sounds.  ?   Comments: Pulmonary exam shows decent breath sounds bilaterally.  She may have some congestion and wheezes bilaterally.  She may have a little bit decreased breath sounds at the right lung base. ?Abdominal:  ?   General: Bowel sounds are normal.  ?   Palpations: Abdomen is soft.  ?Musculoskeletal:     ?   General: No tenderness or deformity. Normal range of motion.  ?   Cervical back: Normal range of motion.  ?   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  This is  the ring finger.  She has a amputation down to the proximal interphalangeal joint.  ?Lymphadenopathy:  ?   Cervical: No cervical adenopathy.  ?Skin: ?   General: Skin is warm and dry.  ?   Findings: No erythema or rash.  ?Neurological:  ?   Mental Status: She is alert and oriented to person, place, and time.  ?Psychiatric:     ?   Behavior: Behavior normal.     ?  Thought Content: Thought content normal.     ?   Judgment: Judgment normal.  ? ? ? ?Lab Results  ?Component Value Date  ? WBC 8.4 02/07/2022  ? HGB 11.7 (L) 02/07/2022  ? HCT 34.6 (L) 02/07/2022  ? MCV 100.3 (H) 02/07/2022  ? PLT 296 02/07/2022  ? ?  Chemistry   ?   ?Component Value Date/Time  ? NA 140 02/07/2022 1312  ? NA 144 07/16/2017 1029  ? K 4.8 02/07/2022 1312  ? CL 103 02/07/2022 1312  ? CO2 28 02/07/2022 1312  ? BUN 32 (H) 02/07/2022 1312  ? BUN 14 07/16/2017 1029  ? CREATININE 0.82 02/07/2022 1312  ?    ?Component Value Date/Time  ? CALCIUM 9.0 02/07/2022 1312  ? ALKPHOS 53 02/07/2022 1312  ? AST 29 02/07/2022 1312  ? ALT 26 02/07/2022 1312  ? BILITOT 0.7 02/07/2022 1312  ?  ? ? ?Impression and Plan: ? ?Peggy Howard is a very charming 88 year old white female.  She has recurrent melanoma.  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 ? ?The tumor is C-kit positive.  We had her on Gleevec.  She has progressed.  We will now try her on nilotinib.  Hopefully, she will have a response to nilotinib.  If not, then we may have to switch to [place]. ? ?I just want her quality of life to be better.  I just really hate that she is having a tough time. ? ?I will try her on some Antivert to see if this may help with some of the dizziness. ? ?I would like to see her back in about 3-4 weeks.  I think shortly has an appointment to see [name]. ? ? ? ?Volanda Napoleon, MD ?3/17/20232:47 PM  ?

## 2022-02-07 NOTE — Progress Notes (Signed)
Oncology Nurse Navigator Documentation ? ?Oncology Nurse Navigator Flowsheets 02/07/2022  ?Planned Course of Treatment -  ?Phase of Treatment -  ?Chemotherapy Pending- Reason: -  ?Chemotherapy Actual Start Date: -  ?Radiation Actual Start Date: -  ?Radiation Actual End Date: -  ?Navigator Follow Up Date: 03/03/2022  ?Navigator Follow Up Reason: Scan Review  ?Navigator Location [facility]  ?Navigator Encounter Type Appt/Treatment Plan Review  ?Telephone -  ?Treatment Initiated Date -  ?Patient Visit Type MedOnc  ?Treatment Phase Active Tx  ?Barriers/Navigation Needs Coordination of Care;Education  ?Education -  ?Interventions None Required  ?Acuity Level 2-Minimal Needs (1-2 Barriers Identified)  ?Referrals -  ?Coordination of Care -  ?Education Method -  ?Support Groups/Services Friends and Family  ?Time Spent with Patient 15  ?  ?

## 2022-02-07 NOTE — Telephone Encounter (Signed)
Oral Oncology Pharmacist Encounter ? 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 ? ?CBC w/ Diff and CMP from 02/07/22 assessed, no relevant lab abnormalities noted. Prescription dose and frequency assessed. Planned start date per MD note 02/14/22. ? ?Current medication list in Epic reviewed, DDIs with Tasigna identified: ?Category C drug-drug interaction between Tasigna and Marinol - Tasigna, a moderate CYP3A4 inhibitor may increase serum concentrations of Marinol. Recommend monitoring for increased ADEs from Marinol including but not limited to sedation and fatigue. ? ?Evaluated chart and no patient barriers to medication adherence noted.  ? ?Prescription has been e-scribed to the [facility] for benefits analysis and approval. ? ?Oral Oncology Clinic will continue to follow for insurance authorization, copayment issues, initial counseling and start date. ? ?Leron Croak, PharmD, BCPS ?Hematology/Oncology Clinical Pharmacist ?[facility] Oral Chemotherapy Navigation Clinics ?[phone] ?02/07/2022 3:21 PM ? ? ? ? ?

## 2022-02-07 NOTE — Telephone Encounter (Signed)
Oral Oncology Patient Advocate Encounter ? ?After completing a benefits investigation, prior authorization for Tasigna is not required at this time through [employer]. ? ?Patient's copay is $38.00 ? ?Dennison Nancy CPHT ?Specialty Pharmacy Patient Advocate ?[place] ?Phone [phone] ?Fax [phone] ?02/07/2022 3:29 PM ?   ? ? ?  ? ? ? ?

## 2022-02-08 LAB — T4: T4, Total: 6 ug/dL (ref 4.5–12.0)

## 2022-02-10 ENCOUNTER — Other Ambulatory Visit (HOSPITAL_COMMUNITY): Payer: Self-pay

## 2022-02-10 LAB — TSH: TSH: 3.186 u[IU]/mL (ref 0.308–3.960)

## 2022-02-10 NOTE — Telephone Encounter (Signed)
Oral Chemotherapy Pharmacist Encounter ?  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  ?Counseled on administration, dosing, side effects, monitoring, drug-food interactions, safe handling, storage, and disposal. ? ?Patient will take Tasigna 150mg  capsules, 2 capsules (300mg ) by mouth twice daily, with doses taken ~12 hours apart.  ? ?Patient will take Tasigna on an empty stomach, at least 1 hour before or 2 hours after meals with a glass of water.  ? ?Discussed in detail with patient's son the drug-drug interaction between Tasigna and Pepcid. Patient's son informed that the Tasigna will need to be spaced out either 2 hours before taking the Pepcid or 10 hours after taking Pepcid. Recommended an example that if patient is to take AM Tasigna dose at 8 AM, she could then take AM Pepcid dose at 10 AM, followed by PM Tasigna dose at 8PM and PM Pepcid dose at 10PM. Patient's son expressed understanding.  ? ?Patient's son knows that patient will need to avoid grapefruit and grapefruit juice while on therapy with Tasigna. ? ?Tasigna start date: 02/14/22 ?  ?Adverse effects include but are not limited to: fatigue, nausea, vomiting, diarrhea, rash, arthralgias, altered cardiac conduction, and decreased blood counts.   ?  ?Reviewed importance of keeping a medication schedule and plan for any missed doses. No barriers to medication adherence identified. ?  ?Medication reconciliation performed and medication/allergy list updated. ? ?Insurance authorization for Rae Lips has been obtained. Test claim at the pharmacy revealed copayment $0 for 1st fill of Tasigna. Patient's son will pick this up the [facility] outpatient [facility] on 02/11/22 to deliver to patient's home on 02/12/22. ? ?Son informed the pharmacy will reach out 5-7 days prior to needing next  fill of Tasigna to coordinate continued medication acquisition to prevent break in therapy. ? ?All questions answered. ? ?Filiberto Pinks voiced understanding and appreciation.  ?  ?Medication education handout placed in mail for patient and patient's family. Patient and patient's family know to call the office with questions or concerns. Oral Chemotherapy Clinic phone number provided. ? ?Leron Croak, PharmD, BCPS ?Hematology/Oncology Clinical Pharmacist ?[facility] Oral Chemotherapy Navigation Clinics ?[phone] ?02/10/2022 2:19 PM ? ?

## 2022-02-11 ENCOUNTER — Other Ambulatory Visit (HOSPITAL_COMMUNITY): Payer: Self-pay

## 2022-02-12 ENCOUNTER — Inpatient Hospital Stay: Payer: Medicare Other | Admitting: Nurse Practitioner

## 2022-02-12 ENCOUNTER — Other Ambulatory Visit (HOSPITAL_COMMUNITY): Payer: Self-pay

## 2022-03-03 ENCOUNTER — Encounter: Payer: Self-pay | Admitting: *Deleted

## 2022-03-03 ENCOUNTER — Telehealth: Payer: Self-pay | Admitting: *Deleted

## 2022-03-03 ENCOUNTER — Inpatient Hospital Stay (HOSPITAL_COMMUNITY)
Admission: EM | Admit: 2022-03-03 | Discharge: 2022-03-06 | DRG: 951 | Disposition: A | Discharge status: Hospice | Payer: Medicare Other | Attending: Internal Medicine | Admitting: Internal Medicine

## 2022-03-03 ENCOUNTER — Encounter (HOSPITAL_COMMUNITY): Payer: Medicare Other

## 2022-03-03 ENCOUNTER — Emergency Department (HOSPITAL_COMMUNITY): Payer: Medicare Other

## 2022-03-03 ENCOUNTER — Other Ambulatory Visit (HOSPITAL_COMMUNITY): Payer: Self-pay

## 2022-03-03 ENCOUNTER — Encounter (HOSPITAL_COMMUNITY): Payer: Self-pay

## 2022-03-03 DIAGNOSIS — C7800 Secondary malignant neoplasm of unspecified lung: Secondary | ICD-10-CM | POA: Diagnosis present

## 2022-03-03 DIAGNOSIS — R5381 Other malaise: Secondary | ICD-10-CM

## 2022-03-03 DIAGNOSIS — J910 Malignant pleural effusion: Secondary | ICD-10-CM | POA: Diagnosis present

## 2022-03-03 DIAGNOSIS — Z87891 Personal history of nicotine dependence: Secondary | ICD-10-CM

## 2022-03-03 DIAGNOSIS — R112 Nausea with vomiting, unspecified: Secondary | ICD-10-CM

## 2022-03-03 DIAGNOSIS — Z66 Do not resuscitate: Secondary | ICD-10-CM | POA: Diagnosis present

## 2022-03-03 DIAGNOSIS — R197 Diarrhea, unspecified: Secondary | ICD-10-CM | POA: Diagnosis present

## 2022-03-03 DIAGNOSIS — C439 Malignant melanoma of skin, unspecified: Secondary | ICD-10-CM

## 2022-03-03 DIAGNOSIS — C7801 Secondary malignant neoplasm of right lung: Secondary | ICD-10-CM | POA: Diagnosis present

## 2022-03-03 DIAGNOSIS — Z923 Personal history of irradiation: Secondary | ICD-10-CM

## 2022-03-03 DIAGNOSIS — Z89022 Acquired absence of left finger(s): Secondary | ICD-10-CM

## 2022-03-03 DIAGNOSIS — Z96653 Presence of artificial knee joint, bilateral: Secondary | ICD-10-CM | POA: Diagnosis present

## 2022-03-03 DIAGNOSIS — Z515 Encounter for palliative care: Principal | ICD-10-CM

## 2022-03-03 DIAGNOSIS — Z90710 Acquired absence of both cervix and uterus: Secondary | ICD-10-CM

## 2022-03-03 DIAGNOSIS — R64 Cachexia: Secondary | ICD-10-CM | POA: Diagnosis present

## 2022-03-03 DIAGNOSIS — Z8719 Personal history of other diseases of the digestive system: Secondary | ICD-10-CM

## 2022-03-03 DIAGNOSIS — R627 Adult failure to thrive: Secondary | ICD-10-CM

## 2022-03-03 DIAGNOSIS — C7802 Secondary malignant neoplasm of left lung: Secondary | ICD-10-CM | POA: Diagnosis present

## 2022-03-03 DIAGNOSIS — R630 Anorexia: Secondary | ICD-10-CM | POA: Diagnosis present

## 2022-03-03 DIAGNOSIS — G936 Cerebral edema: Secondary | ICD-10-CM | POA: Diagnosis present

## 2022-03-03 DIAGNOSIS — Z79899 Other long term (current) drug therapy: Secondary | ICD-10-CM

## 2022-03-03 DIAGNOSIS — D75839 Thrombocytosis, unspecified: Secondary | ICD-10-CM

## 2022-03-03 DIAGNOSIS — J9610 Chronic respiratory failure, unspecified whether with hypoxia or hypercapnia: Secondary | ICD-10-CM | POA: Diagnosis present

## 2022-03-03 DIAGNOSIS — Z888 Allergy status to other drugs, medicaments and biological substances status: Secondary | ICD-10-CM

## 2022-03-03 DIAGNOSIS — C7931 Secondary malignant neoplasm of brain: Secondary | ICD-10-CM | POA: Diagnosis not present

## 2022-03-03 DIAGNOSIS — Z6825 Body mass index (BMI) 25.0-25.9, adult: Secondary | ICD-10-CM

## 2022-03-03 LAB — COMPREHENSIVE METABOLIC PANEL
ALT: 36 U/L (ref 0–44)
AST: 53 U/L — ABNORMAL HIGH (ref 15–41)
Albumin: 3.2 g/dL — ABNORMAL LOW (ref 3.5–5.0)
Alkaline Phosphatase: 69 U/L (ref 38–126)
Anion gap: 10 (ref 5–15)
BUN: 22 mg/dL (ref 8–23)
CO2: 28 mmol/L (ref 22–32)
Calcium: 8.6 mg/dL — ABNORMAL LOW (ref 8.9–10.3)
Chloride: 97 mmol/L — ABNORMAL LOW (ref 98–111)
Creatinine, Ser: 0.77 mg/dL (ref 0.44–1.00)
GFR, Estimated: >60 mL/min (ref >60)
Glucose, Bld: 148 mg/dL — ABNORMAL HIGH (ref 70–99)
Potassium: 4 mmol/L (ref 3.5–5.1)
Sodium: 135 mmol/L (ref 135–145)
Total Bilirubin: 1.6 mg/dL — ABNORMAL HIGH (ref 0.3–1.2)
Total Protein: 7 g/dL (ref 6.5–8.1)

## 2022-03-03 LAB — CBC WITH DIFFERENTIAL/PLATELET
Abs Immature Granulocytes: 0.07 10*3/uL (ref 0.00–0.07)
Basophils Absolute: 0.1 10*3/uL (ref 0.0–0.1)
Basophils Relative: 1 %
Eosinophils Absolute: 0.1 10*3/uL (ref 0.0–0.5)
Eosinophils Relative: 2 %
HCT: 37.3 % (ref 36.0–46.0)
Hemoglobin: 12.3 g/dL (ref 12.0–15.0)
Immature Granulocytes: 1 %
Lymphocytes Relative: 9 %
Lymphs Abs: 0.8 10*3/uL (ref 0.7–4.0)
MCH: 32.9 pg (ref 26.0–34.0)
MCHC: 33 g/dL (ref 30.0–36.0)
MCV: 99.7 fL (ref 80.0–100.0)
Monocytes Absolute: 0.7 10*3/uL (ref 0.1–1.0)
Monocytes Relative: 8 %
Neutro Abs: 7.4 10*3/uL (ref 1.7–7.7)
Neutrophils Relative %: 79 %
Platelets: 438 10*3/uL — ABNORMAL HIGH (ref 150–400)
RBC: 3.74 MIL/uL — ABNORMAL LOW (ref 3.87–5.11)
RDW: 12.9 % (ref 11.5–15.5)
WBC: 9.2 10*3/uL (ref 4.0–10.5)
nRBC: 0 % (ref 0.0–0.2)

## 2022-03-03 LAB — TROPONIN I (HIGH SENSITIVITY)
Troponin I (High Sensitivity): 7 ng/L (ref <18)
Troponin I (High Sensitivity): 7 ng/L (ref <18)

## 2022-03-03 LAB — BRAIN NATRIURETIC PEPTIDE: B Natriuretic Peptide: 170.2 pg/mL — ABNORMAL HIGH (ref 0.0–100.0)

## 2022-03-03 IMAGING — CR DG CHEST 2V
2 series · 2 of 2 positions shown · non-contrast
Comparison: Chest x-ray [DATE].  Chest CT [DATE].

CLINICAL DATA: Shortness of breath.

EXAM:
CHEST - 2 VIEW

[w chest lat]
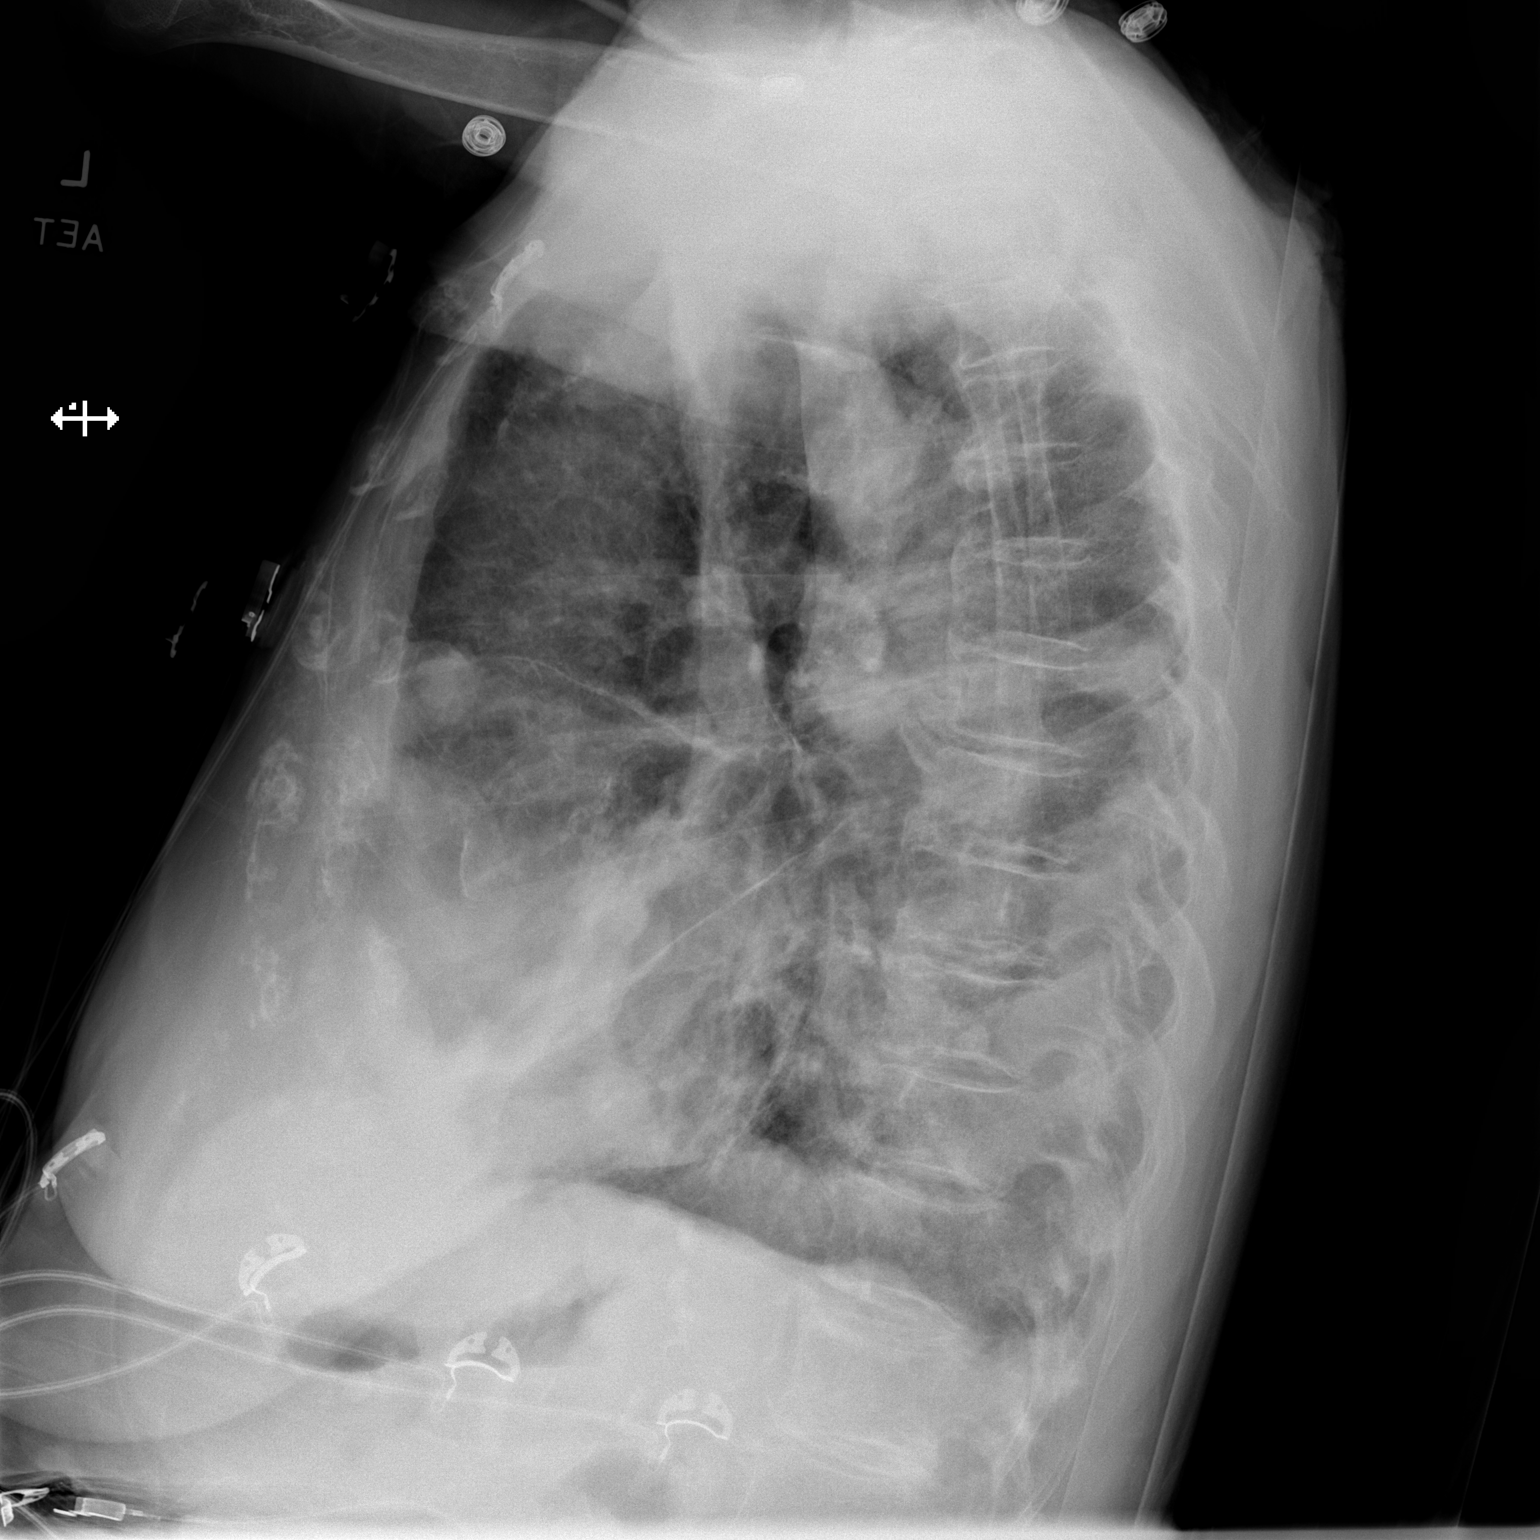

[x chest ap]
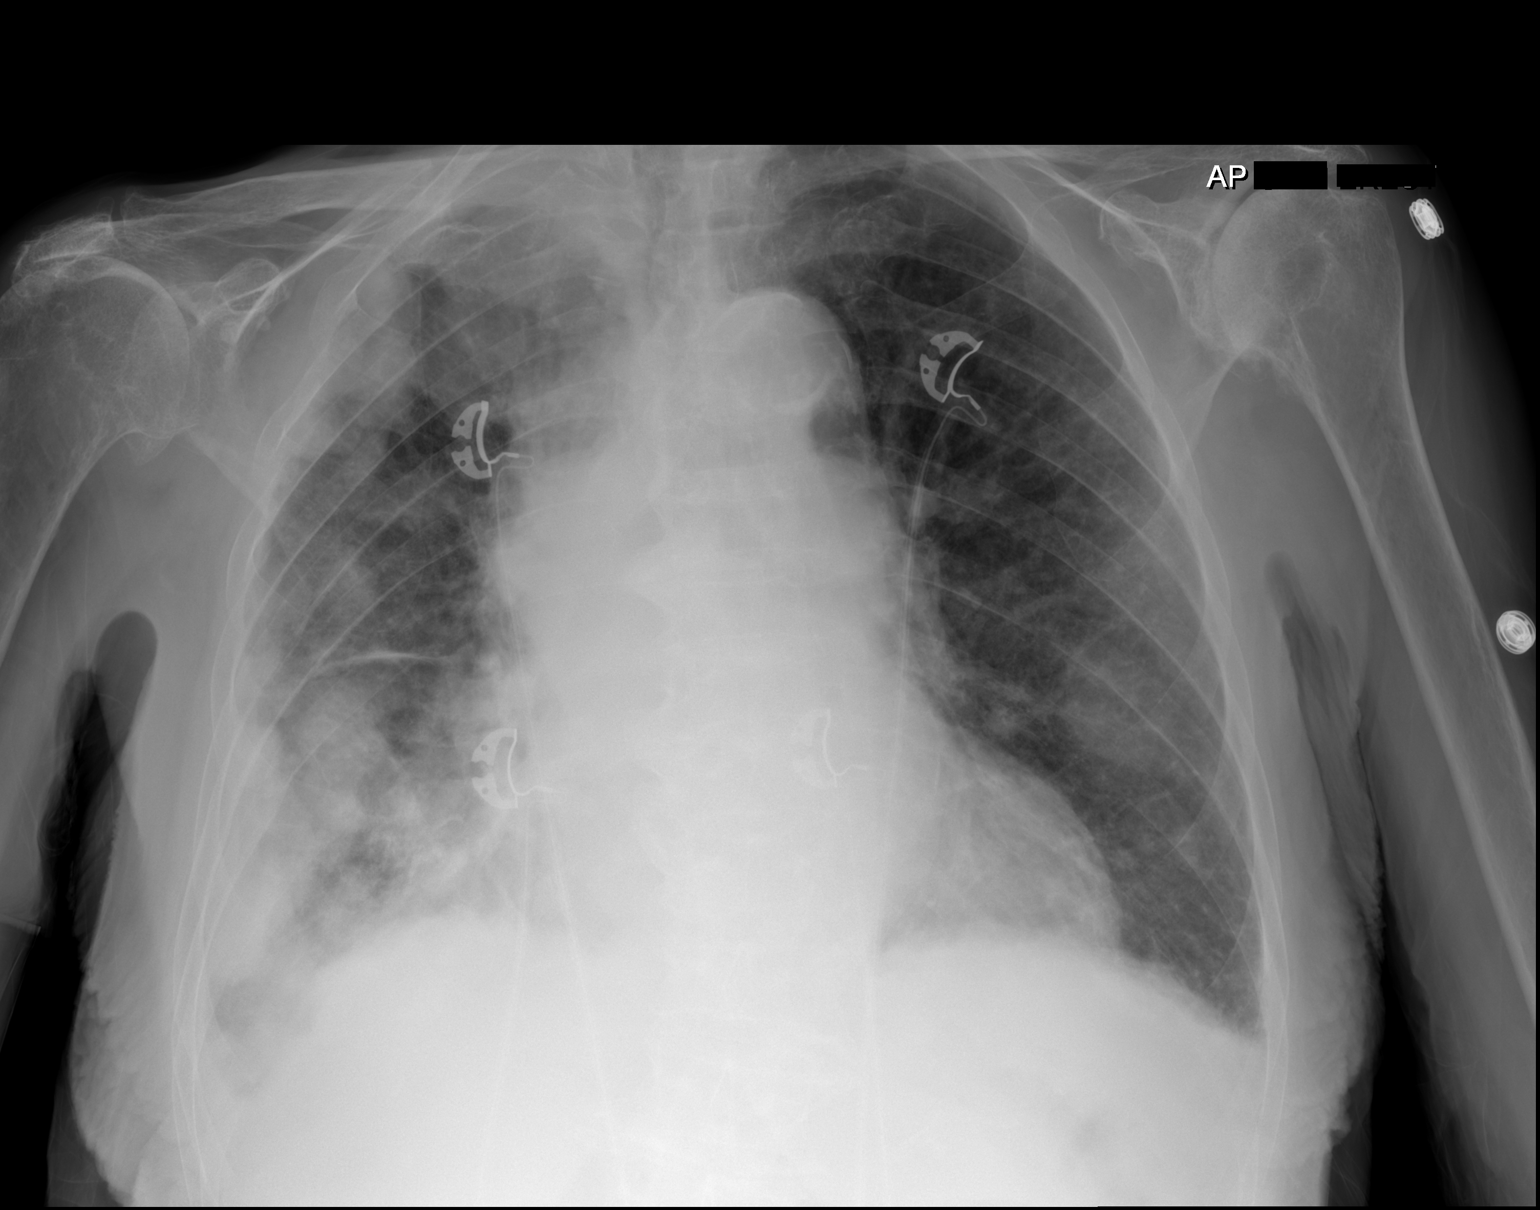

[2 of 2 positions shown; findings below may reference images not displayed]

FINDINGS: Multiple peripheral pulmonary masses are again seen throughout the
right lung. These appear increased in size and number when compared
to the prior examination. Right hilar enlargement has also increased
from prior examination. Left pleural base nodular density is grossly
unchanged. No pleural effusion or pneumothorax. Heart size within
normal limits. No acute fractures.
IMPRESSION: 1. Increasing size and number of peripheral right pulmonary masses.
2. Increasing soft tissue density in the right hilum.

## 2022-03-03 IMAGING — CT CT HEAD W/O CM
3 series · 16 of 47 positions shown, 19 images · non-contrast
Comparison: MRI from [DATE] as well as [DATE]

CLINICAL DATA: History of melanoma with metastatic disease with new
onset fatigue.



[Series 2: head wo · axial · 0.47mm/px · z∈[+1272,+1402]mm · 10 of 32 slices shown, 13 images]
[im 3/32  brain]
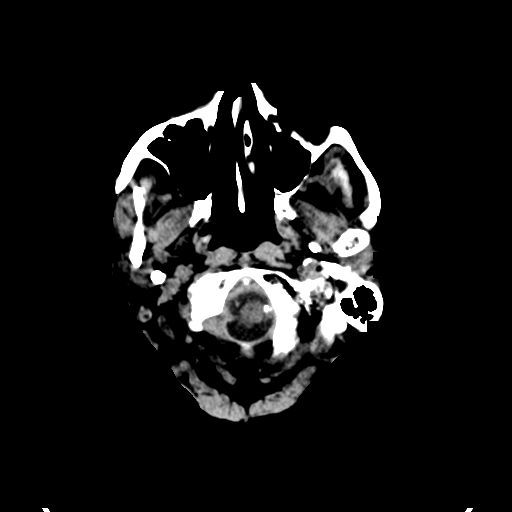
[im 3/32  bone]
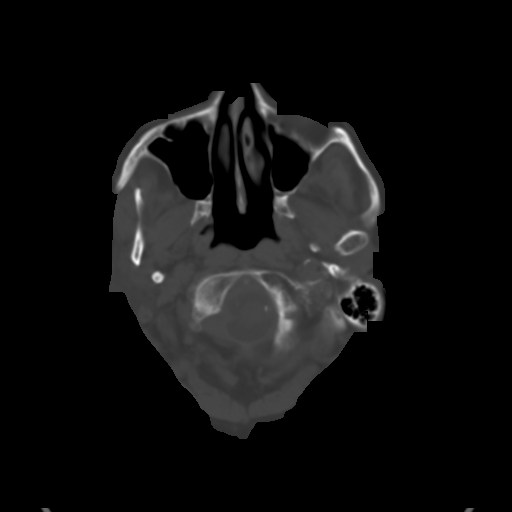
[im 6/32  brain]
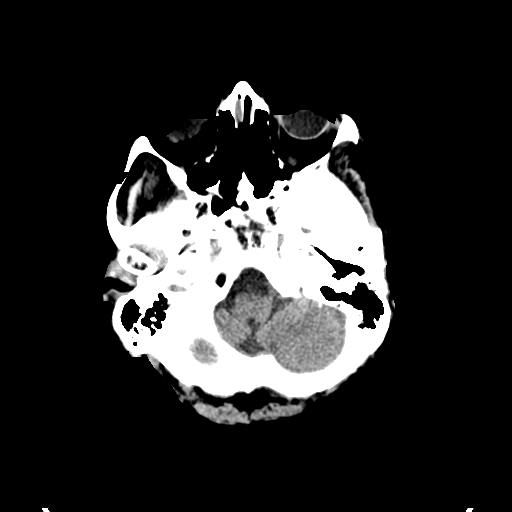
[im 9/32  brain]
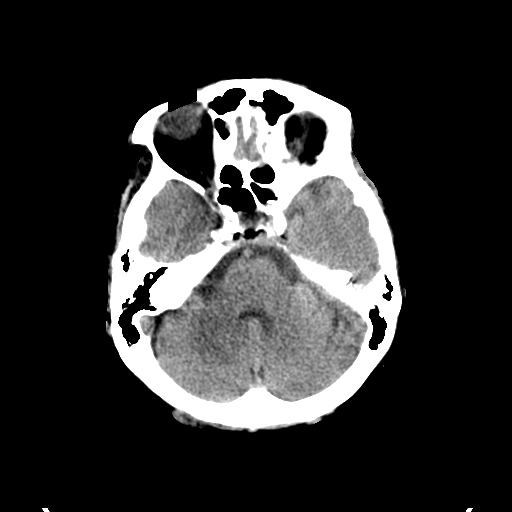
[im 11/32  brain]
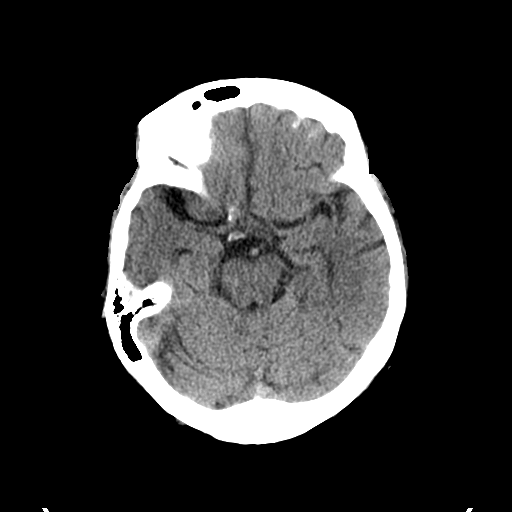
[im 14/32  brain]
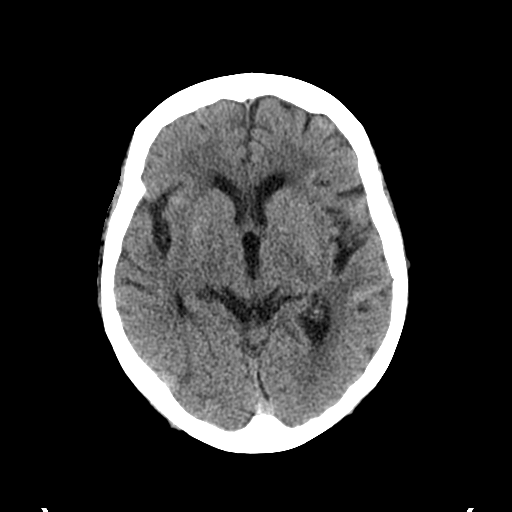
[im 14/32  bone]
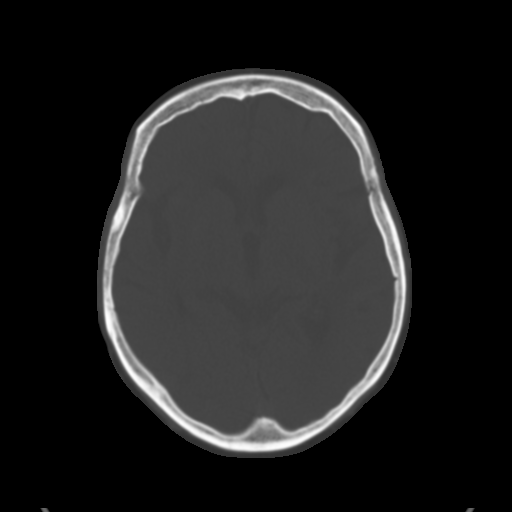
[im 18/32  brain]
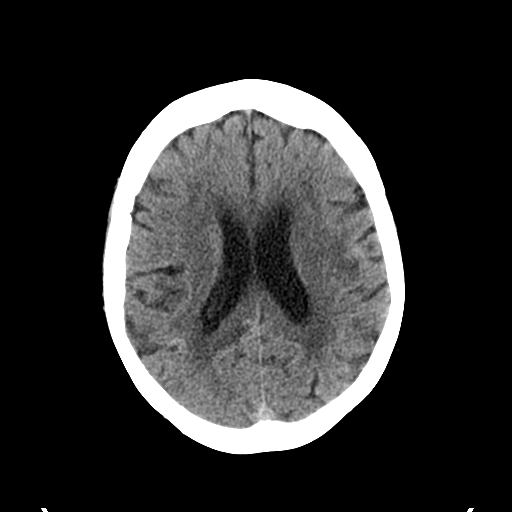
[im 21/32  brain]
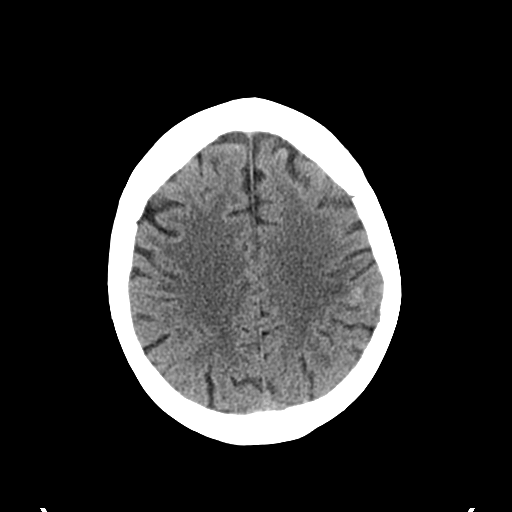
[im 24/32  brain]
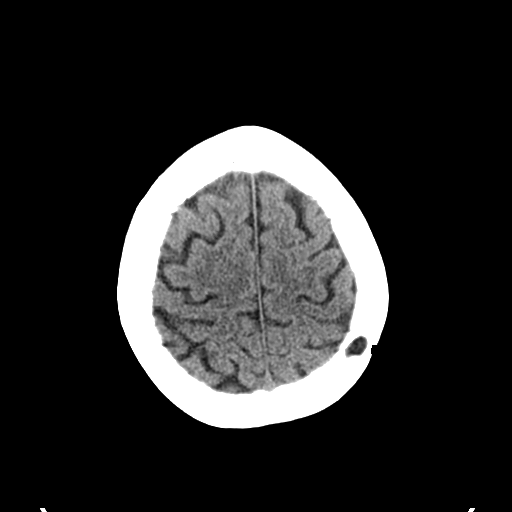
[im 26/32  brain]
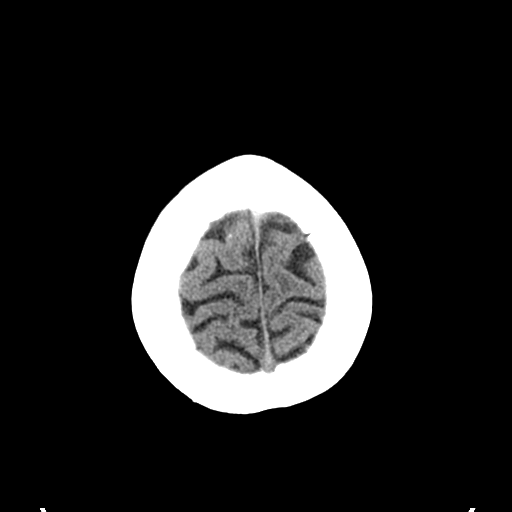
[im 26/32  bone]
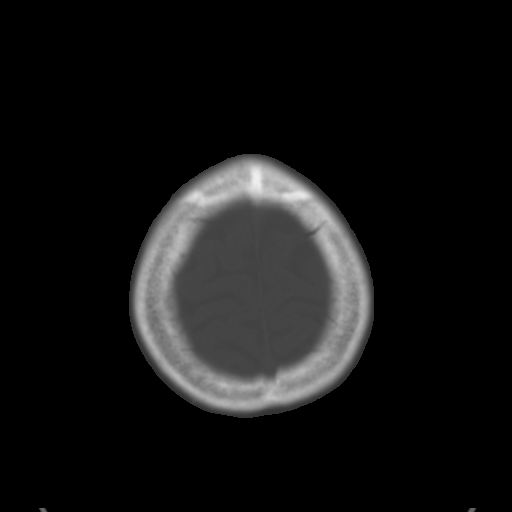
[im 29/32  brain]
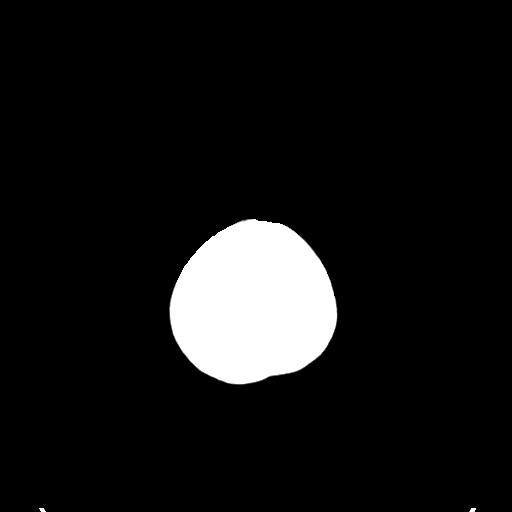

[Series 5: coronal soft tissue · coronal · 0.34mm/px · 3 of 70 slices shown]
[im 24/70  brain]
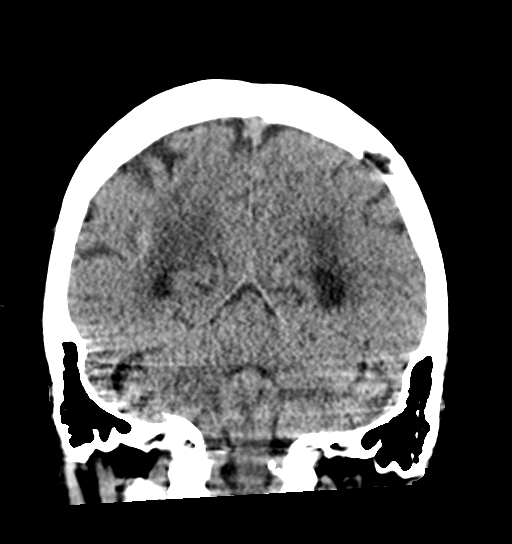
[im 31/70  brain]
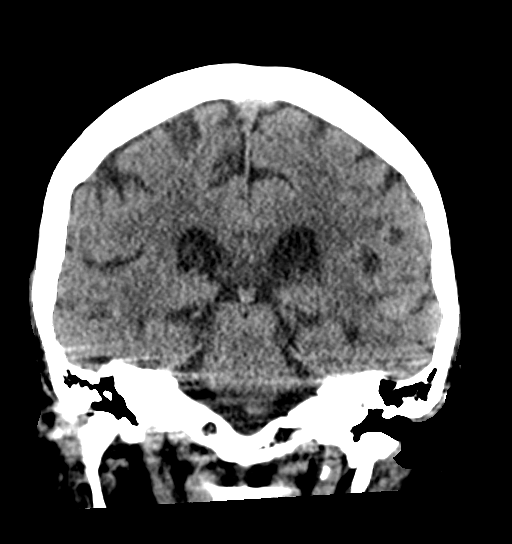
[im 39/70  brain]
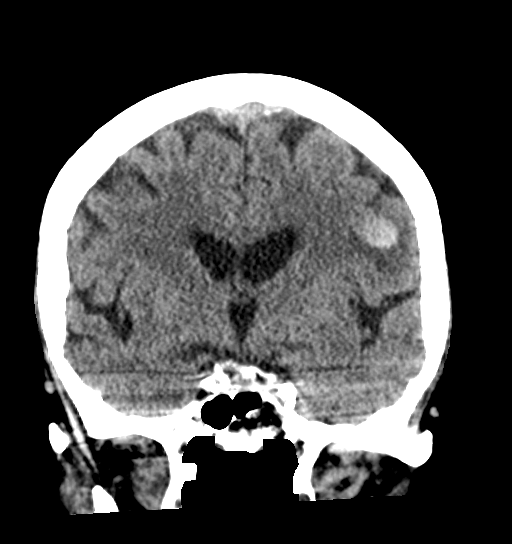

[Series 6: sagittal soft tissue · sagittal · 0.36mm/px · 3 of 59 slices shown]
[im 20/59  brain]
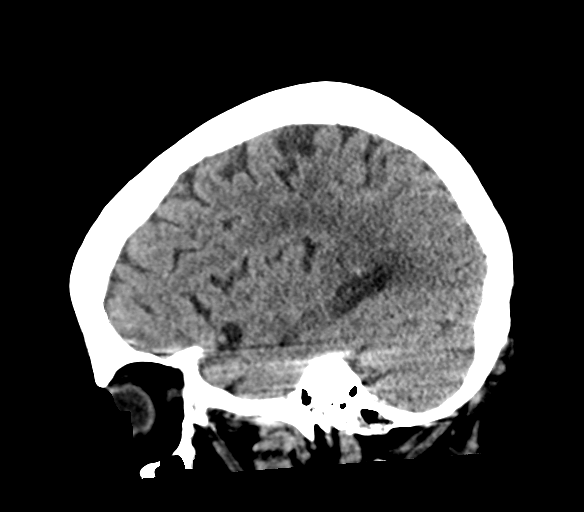
[im 30/59  brain]
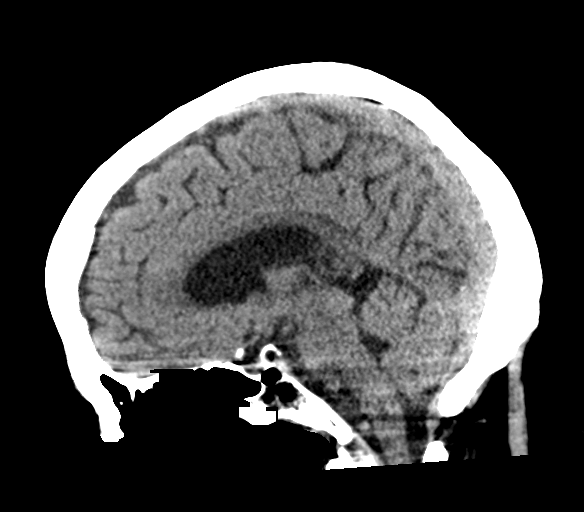
[im 39/59  brain]
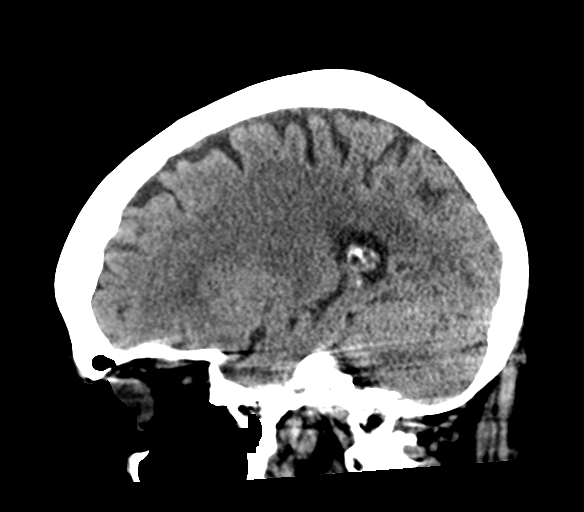

[16 of 47 positions shown; findings below may reference images not displayed]

FINDINGS: Brain: Multiple areas of increased attenuation are identified
throughout both cerebral hemispheres similar to that seen on recent
MRI consistent with metastatic disease. Largest of these lies in the
left frontal parietal region measuring up to 13 mm. Scattered
smaller lesions are noted.

Vascular: No hyperdense vessel or unexpected calcification.

Skull: No acute abnormality noted. Benign lucency again identified
in the parietal bone.

Sinuses/Orbits: No acute finding.

Other: None.
IMPRESSION: Changes consistent with metastatic disease throughout both cerebral
hemispheres. The largest of these lesions is noted on the left as
described measuring up to 13 mm. This is increased in size when
compared with the recent MRI. The increased density may represent
some focal interval hemorrhage. Follow-up with contrast enhanced MRI
may be helpful.

## 2022-03-03 MED ORDER — DEXAMETHASONE 4 MG PO TABS
4.0000 mg | ORAL_TABLET | Freq: Every day | ORAL | Status: DC
  Administered 2022-03-04: 4 mg via ORAL
  Filled 2022-03-03: qty 1

## 2022-03-03 MED ORDER — DORZOLAMIDE HCL-TIMOLOL MAL 2-0.5 % OP SOLN
1.0000 [drp] | Freq: Two times a day (BID) | OPHTHALMIC | Status: DC
  Administered 2022-03-04 – 2022-03-06 (×3): 1 [drp] via OPHTHALMIC
  Filled 2022-03-03: qty 10

## 2022-03-03 MED ORDER — MOMETASONE FURO-FORMOTEROL FUM 200-5 MCG/ACT IN AERO: 2.0000 | INHALATION_SPRAY | Freq: Two times a day (BID) | RESPIRATORY_TRACT | Status: DC

## 2022-03-03 MED ORDER — TRIMETHOBENZAMIDE HCL 100 MG/ML IM SOLN
200.0000 mg | Freq: Four times a day (QID) | INTRAMUSCULAR | Status: DC | PRN
  Filled 2022-03-03: qty 2

## 2022-03-03 MED ORDER — LACTATED RINGERS IV BOLUS
500.0000 mL | Freq: Once | INTRAVENOUS | Status: AC
  Administered 2022-03-03: 500 mL via INTRAVENOUS

## 2022-03-03 MED ORDER — ALBUTEROL SULFATE HFA 108 (90 BASE) MCG/ACT IN AERS: 2.0000 | INHALATION_SPRAY | Freq: Four times a day (QID) | RESPIRATORY_TRACT | Status: DC | PRN

## 2022-03-03 MED ORDER — DRONABINOL 2.5 MG PO CAPS
2.5000 mg | ORAL_CAPSULE | Freq: Two times a day (BID) | ORAL | Status: DC
  Administered 2022-03-04: 2.5 mg via ORAL
  Filled 2022-03-03: qty 1

## 2022-03-03 MED ORDER — SODIUM CHLORIDE 0.9% FLUSH
3.0000 mL | Freq: Two times a day (BID) | INTRAVENOUS | Status: DC
  Administered 2022-03-04: 3 mL via INTRAVENOUS

## 2022-03-03 MED ORDER — TRIMETHOBENZAMIDE HCL 100 MG/ML IM SOLN
200.0000 mg | Freq: Once | INTRAMUSCULAR | Status: AC
  Administered 2022-03-03: 200 mg via INTRAMUSCULAR
  Filled 2022-03-03: qty 2

## 2022-03-03 MED ORDER — SODIUM CHLORIDE 0.9 % IV SOLN: INTRAVENOUS | Status: DC

## 2022-03-03 MED ORDER — ALBUTEROL SULFATE (2.5 MG/3ML) 0.083% IN NEBU
2.5000 mg | INHALATION_SOLUTION | Freq: Four times a day (QID) | RESPIRATORY_TRACT | Status: DC | PRN
  Administered 2022-03-04: 2.5 mg via RESPIRATORY_TRACT
  Filled 2022-03-03: qty 3

## 2022-03-03 MED ORDER — MECLIZINE HCL 25 MG PO TABS: 12.5000 mg | ORAL_TABLET | Freq: Three times a day (TID) | ORAL | Status: DC | PRN

## 2022-03-03 MED ORDER — FAMOTIDINE 20 MG PO TABS: 40.0000 mg | ORAL_TABLET | Freq: Two times a day (BID) | ORAL | Status: DC

## 2022-03-03 MED ORDER — LATANOPROST 0.005 % OP SOLN
1.0000 [drp] | Freq: Every day | OPHTHALMIC | Status: DC
  Administered 2022-03-04 – 2022-03-05 (×2): 1 [drp] via OPHTHALMIC
  Filled 2022-03-03: qty 2.5

## 2022-03-03 NOTE — Telephone Encounter (Signed)
Received phone call from [name] patients son stating that patient has taken a turn for the worse in last few days.  Patient cancelled her PET scan today because she wasn't feeling well.  Patient extremely weak and not eating.  Very pale and experiencing wheezing when up and walking around.  Dr Marin Olp notified.  Wanted to get patient in today to see him with labs but [name] unable to get her in today since he has to travel to [facility].  He really wants to take his mom to the ED but hasnt been able to convince of this yet.  He will talk to her today and call me back later to see if she agrees to the ED.   ?

## 2022-03-03 NOTE — Progress Notes (Signed)
Patient had to cancel their PET scan for today due to illness. Patient had follow up appointment this Friday. Will address need for rescheduling then.  ? ?Oncology Nurse Navigator Documentation ? ? ?  03/03/2022  ? 10:45 AM  ?Oncology Nurse Navigator Flowsheets  ?Navigator Follow Up Date: 03/07/2022  ?Navigator Follow Up Reason: Follow-up Appointment  ?Navigator Location [facility]  ?Patient Visit Type MedOnc  ?Treatment Phase Active Tx  ?Barriers/Navigation Needs Coordination of Care;Education  ?Interventions None Required  ?Acuity Level 2-Minimal Needs (1-2 Barriers Identified)  ?Support Groups/Services Friends and Family  ?Time Spent with Patient 15  ?  ?

## 2022-03-03 NOTE — H&P (Signed)
?History and Physical  ? ?Peggy Howard DVV:[id number] DOB: 05/17/1933 DOA: 03/03/2022 ? ?PCP: Pcp, No  ? ?Patient coming from: Home ? ?Chief Complaint: Nausea, vomiting, diarrhea, fatigue, shortness of breath. ? ?HPI: Peggy Howard is a 88 y.o. 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 ? ?Patient has recently had further decline in the setting of known metastatic melanoma.  Experiencing fatigue, shortness of breath, nausea, vomiting, diarrhea.  Significant decreased p.o. intake.  Patient and family interested in transitioning to palliative care and been discussing transition to comfort care. ? ?Denies fevers, chills, chest pain, abdominal pain constipation. ? ?ED Course: Vital signs in ED stable.  Lab work-up included CMP with chloride 97, glucose 148, calcium 8.6, albumin 3.2, AST 53, T. bili 1.6.  CBC with platelets 438.  Troponin normal with repeat pending.  BNP mildly elevated at 170.  Chest x-ray showing increased size and number of metastases as well as increased soft tissue density at the right hilum.  CT head showing mets with the largest being 13 mm on the left and increased in size from previous MRI.  Also noted is possible focal hemorrhage.  Patient received 500 cc of IV fluids and TM in the ED. ? ?Review of Systems: As per HPI otherwise all other systems reviewed and are negative. ? ?Past Medical History:  ?Diagnosis Date  ? Arthritis   ? Goals of care, counseling/discussion 06/21/2021  ? Malignant pleural effusion 06/21/2021  ? Melanoma metastatic to brain [facility])   ? Melanoma metastatic to lung ([place]) 06/21/2021  ? Rectal prolapse   ? ? ?Past Surgical History:  ?Procedure Laterality Date  ? ABDOMINAL HYSTERECTOMY    ? amputation of left finger     ? 2021  ? BIOPSY  03/27/2021  ? Procedure: BIOPSY;   Surgeon: Irving Copas., MD;  Location: [facility];  Service: Gastroenterology;;  ? COLONOSCOPY WITH PROPOFOL N/A 03/27/2021  ? Procedure: COLONOSCOPY WITH PROPOFOL;  Surgeon: Mansouraty, Telford Nab., MD;  Location: [facility];  Service: Gastroenterology;  Laterality: N/A;  ? JOINT REPLACEMENT    ? POLYPECTOMY  03/27/2021  ? Procedure: POLYPECTOMY;  Surgeon: Rush Landmark Telford Nab., MD;  Location: [facility];  Service: Gastroenterology;;  ? REMOVAL OF PLEURAL DRAINAGE CATHETER  09/02/2021  ? Procedure: REMOVAL OF PLEURAL DRAINAGE CATHETER;  Surgeon: Freddi Starr, MD;  Location: [facility];  Service: Pulmonary;;  ? REPLACEMENT TOTAL KNEE Bilateral   ? XI ROBOT ASSISTED RECTOPEXY N/A 06/12/2021  ? Procedure: XI ROBOT ASSISTED RECTOPEXY;  Surgeon: Leighton Ruff, MD;  Location: [facility];  Service: General;  Laterality: N/A;  ? ? ?Social History ? reports that she quit smoking about 46 years ago. Her smoking use included cigarettes. She has a 50.00 pack-year smoking history. She has never used smokeless tobacco. She reports current drug use. Drug: Benzodiazepines. She reports that she does not drink alcohol. ? ?Allergies  ?Allergen Reactions  ? Vitamin D Analogs Other (See Comments)  ?  "dizziness"  ? ?History reviewed. No pertinent family history. ? ?Prior to Admission medications   ?Medication Sig Start Date End Date Taking? Authorizing Provider  ?acetaminophen (TYLENOL) 500 MG tablet Take 1,000 mg by mouth every 6 (six) hours as needed for mild pain, fever or headache.    [provider]  ?albuterol (VENTOLIN HFA) 108 (90 Base) MCG/ACT inhaler Inhale 2 puffs into  the lungs every 6 (six) hours as needed for wheezing or shortness of breath. 02/04/22   Lavina Hamman, MD  ?dexamethasone (DECADRON) 4 MG tablet Take 1 tablet (4 mg total) by mouth 3 (three) times daily for 2 days, THEN 1 tablet (4 mg total) 2 (two) times daily for 2 days, THEN 1 tablet (4 mg total) daily. 02/04/22 03/10/22   Lavina Hamman, MD  ?dorzolamide-timolol (COSOPT) 22.3-6.8 MG/ML ophthalmic solution Place 1 drop into both eyes 2 (two) times daily. 01/02/22   [provider]  ?dronabinol (MARINOL) 2.5 MG capsule Take 1 capsule (2.5 mg total) by mouth 2 (two) times daily before lunch and supper. 01/28/22   Celso Amy, NP  ?famotidine (PEPCID) 20 MG tablet Take 40 mg by mouth 2 (two) times daily.    [provider]  ?fluticasone-salmeterol (ADVAIR DISKUS) 250-50 MCG/ACT AEPB Inhale 1 puff into the lungs in the morning and at bedtime. 02/04/22   Lavina Hamman, MD  ?latanoprost (XALATAN) 0.005 % ophthalmic solution Place 1 drop into both eyes at bedtime. 01/02/22   [provider]  ?levothyroxine (SYNTHROID) 50 MCG tablet TAKE 1 TABLET(50 MCG) BY MOUTH DAILY BEFORE AND BREAKFAST ?Patient not taking: Reported on 02/02/2022 01/07/22   Volanda Napoleon, MD  ?meclizine (ANTIVERT) 12.5 MG tablet Take 1 tablet (12.5 mg total) by mouth 3 (three) times daily as needed for dizziness. 02/04/22   Lavina Hamman, MD  ?meclizine (ANTIVERT) 25 MG tablet Take 1 tablet (25 mg total) by mouth 3 (three) times daily as needed for dizziness. 02/07/22   Volanda Napoleon, MD  ?Multiple Vitamins-Minerals (PRESERVISION AREDS 2 PO) Take 1 tablet by mouth 2 (two) times daily.    [provider]  ?nilotinib (TASIGNA) 150 MG capsule Take 2 capsules (300 mg total) by mouth every 12 (twelve) hours. Take on an empty stomach, 1 hr before or 2 hrs after food. 02/07/22   Volanda Napoleon, MD  ?vitamin E 1000 UNIT capsule Take 1,000 Units by mouth daily.    [provider]  ? ? ?Physical Exam: ?Vitals:  ? 03/03/22 1812 03/03/22 1900 03/03/22 2130  ?BP: (!) 156/71 128/69 136/67  ?Pulse: 70 72 74  ?Resp: 18 19 (!) 24  ?Temp: 97.6 ?F (36.4 ?C)    ?TempSrc: Oral    ?SpO2: 93% 94% 92%  ?Weight: 68.5 kg    ?Height: 5\' 5"  (1.651 m)    ? ? ?Physical Exam ?Constitutional:   ?   General: She is not in acute distress. ?   Appearance:  Normal appearance.  ?HENT:  ?   Head: Normocephalic and atraumatic.  ?   Mouth/Throat:  ?   Mouth: Mucous membranes are moist.  ?   Pharynx: Oropharynx is clear.  ?Eyes:  ?   Extraocular Movements: Extraocular movements intact.  ?   Pupils: Pupils are equal, round, and reactive to light.  ?Cardiovascular:  ?   Rate and Rhythm: Normal rate and regular rhythm.  ?   Pulses: Normal pulses.  ?   Heart sounds: Normal heart sounds.  ?Pulmonary:  ?   Effort: Pulmonary effort is normal. No respiratory distress.  ?   Breath sounds: Normal breath sounds.  ?Abdominal:  ?   General: Bowel sounds are normal. There is no distension.  ?   Palpations: Abdomen is soft.  ?   Tenderness: There is no abdominal tenderness.  ?Musculoskeletal:     ?   General: No swelling or deformity.  ?  Skin: ?   General: Skin is warm and dry.  ?Neurological:  ?   General: No focal deficit present.  ?   Mental Status: Mental status is at baseline.  ? ?Labs on Admission: I have personally reviewed following labs and imaging studies ? ?CBC: ?Recent Labs  ?Lab 03/03/22 ?1839  ?WBC 9.2  ?NEUTROABS 7.4  ?HGB 12.3  ?HCT 37.3  ?MCV 99.7  ?PLT 438*  ? ? ?Basic Metabolic Panel: ?Recent Labs  ?Lab 03/03/22 ?1839  ?NA 135  ?K 4.0  ?CL 97*  ?CO2 28  ?GLUCOSE 148*  ?BUN 22  ?CREATININE 0.77  ?CALCIUM 8.6*  ? ? ?GFR: ?Estimated Creatinine Clearance: 49.1 mL/min (by C-G formula based on SCr of 0.77 mg/dL). ? ?Liver Function Tests: ?Recent Labs  ?Lab 03/03/22 ?1839  ?AST 53*  ?ALT 36  ?ALKPHOS 69  ?BILITOT 1.6*  ?PROT 7.0  ?ALBUMIN 3.2*  ? ? ?Urine analysis: ?   ?Component Value Date/Time  ? [place] YELLOW 02/02/2022 1611  ? APPEARANCEUR CLEAR 02/02/2022 1611  ? LABSPEC 1.010 02/02/2022 1611  ? PHURINE 5.5 02/02/2022 1611  ? GLUCOSEU NEGATIVE 02/02/2022 1611  ? [place] NEGATIVE 02/02/2022 1611  ? [place] NEGATIVE 02/02/2022 1611  ? [place] NEGATIVE 02/02/2022 1611  ? [place] NEGATIVE 02/02/2022 1611  ? NITRITE POSITIVE (A) 02/02/2022 1611  ? LEUKOCYTESUR SMALL  (A) 02/02/2022 1611  ? ? ?Radiological Exams on Admission: ?DG Chest 2 View ? ?Result Date: 03/03/2022 ?CLINICAL DATA:  Shortness of breath. EXAM: CHEST - 2 VIEW COMPARISON:  Chest x-ray 02/02/2022.  Chest CT

## 2022-03-03 NOTE — ED Provider Notes (Signed)
?[facility] DEPT ?Provider Note ? ? ?CSN: [id number] ?Arrival date & time: 03/03/22  1806 ? ?  ? ?History ?Chief Complaint  ?Patient presents with  ? Shortness of Breath  ? Emesis  ? ? ?Peggy Howard is a 88 y.o. 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  These have been chronic issues for her but have been gradually worsening over the past few days.  She is accompanied by her son and daughter-in-law who are at bedside.  She is currently on a new medication for her cancer.  She sees Dr. Marin Olp who recommended follow-up with the emergency department.  She denies any abdominal pain and has tried multiple medications to improve her appetite, but has had no success.  She denies any hematemesis, melena, hematochezia.  She denies any fevers at home.  She denies any chest pain, reports occasional shortness of breath.  She complains of dizziness and lightheadedness as well as lip numbness and a throbbing throat feeling for a little over a week now.  Per the son, the patient is was to receive a PET scan today to determine if the cancer regimen is working.  At that time, they would decide if hospice/palliative care was needed.  She was not able to get the scan today as she was feeling fatigued. ? ? ? ?Shortness of Breath ?Associated symptoms: cough and vomiting   ?Associated symptoms: no abdominal pain, no chest pain and no fever   ?Emesis ?Associated symptoms: cough and diarrhea   ?Associated symptoms: no abdominal pain, no chills and no fever   ? ?  ? ?Home Medications ?Prior to Admission medications   ?Medication Sig Start Date End Date Taking? Authorizing Provider  ?acetaminophen (TYLENOL) 500 MG tablet Take 1,000 mg by mouth every 6 (six) hours as needed for mild pain, fever or headache.    [provider]   ?albuterol (VENTOLIN HFA) 108 (90 Base) MCG/ACT inhaler Inhale 2 puffs into the lungs every 6 (six) hours as needed for wheezing or shortness of breath. 02/04/22   Lavina Hamman, MD  ?dexamethasone (DECADRON) 4 MG tablet Take 1 tablet (4 mg total) by mouth 3 (three) times daily for 2 days, THEN 1 tablet (4 mg total) 2 (two) times daily for 2 days, THEN 1 tablet (4 mg total) daily. 02/04/22 03/10/22  Lavina Hamman, MD  ?dorzolamide-timolol (COSOPT) 22.3-6.8 MG/ML ophthalmic solution Place 1 drop into both eyes 2 (two) times daily. 01/02/22   [provider]  ?dronabinol (MARINOL) 2.5 MG capsule Take 1 capsule (2.5 mg total) by mouth 2 (two) times daily before lunch and supper. 01/28/22   Celso Amy, NP  ?famotidine (PEPCID) 20 MG tablet Take 40 mg by mouth 2 (two) times daily.    [provider]  ?fluticasone-salmeterol (ADVAIR DISKUS) 250-50 MCG/ACT AEPB Inhale 1 puff into the lungs in the morning and at bedtime. 02/04/22   Lavina Hamman, MD  ?latanoprost (XALATAN) 0.005 % ophthalmic solution Place 1 drop into both eyes at bedtime. 01/02/22   [provider]  ?levothyroxine (SYNTHROID) 50 MCG tablet TAKE 1 TABLET(50 MCG) BY MOUTH DAILY BEFORE AND BREAKFAST ?Patient not taking: Reported on 02/02/2022 01/07/22   Volanda Napoleon, MD  ?meclizine (ANTIVERT) 12.5 MG tablet Take 1 tablet (12.5 mg total) by mouth 3 (three) times daily as needed for dizziness. 02/04/22  Lavina Hamman, MD  ?meclizine (ANTIVERT) 25 MG tablet Take 1 tablet (25 mg total) by mouth 3 (three) times daily as needed for dizziness. 02/07/22   Volanda Napoleon, MD  ?Multiple Vitamins-Minerals (PRESERVISION AREDS 2 PO) Take 1 tablet by mouth 2 (two) times daily.    [provider]  ?nilotinib (TASIGNA) 150 MG capsule Take 2 capsules (300 mg total) by mouth every 12 (twelve) hours. Take on an empty stomach, 1 hr before or 2 hrs after food. 02/07/22   Volanda Napoleon, MD  ?vitamin E 1000 UNIT capsule Take 1,000 Units  by mouth daily.    [provider]  ?   ? ?Allergies    ?Vitamin d analogs   ? ?Review of Systems   ?Review of Systems  ?Constitutional:  Negative for chills and fever.  ?Respiratory:  Positive for cough and shortness of breath.   ?Cardiovascular:  Negative for chest pain.  ?Gastrointestinal:  Positive for diarrhea, nausea and vomiting. Negative for abdominal pain and constipation.  ?Genitourinary:  Negative for dysuria and hematuria.  ?Musculoskeletal:  Negative for back pain.  ?Neurological:  Positive for dizziness and light-headedness. Negative for syncope and weakness.  ? ?Physical Exam ?Updated Vital Signs ?BP 136/67   Pulse 74   Temp 97.6 ?F (36.4 ?C) (Oral)   Resp (!) 24   Ht 5\' 5"  (1.651 m)   Wt 68.5 kg   SpO2 92%   BMI 25.13 kg/m?  ?Physical Exam ?Vitals and nursing note reviewed.  ?Constitutional:   ?   Appearance: Normal appearance. She is not toxic-appearing.  ?   Comments: Frail, cachetic female patient.   ?HENT:  ?   Head: Normocephalic and atraumatic.  ?   Mouth/Throat:  ?   Comments: Dry mucous membranes. Poor dentition.  ?Eyes:  ?   General: No scleral icterus. ?   Extraocular Movements: Extraocular movements intact.  ?   Pupils: Pupils are equal, round, and reactive to light.  ?Neck:  ?   Comments: No neck or throat mass visualized or palpated. ?Cardiovascular:  ?   Rate and Rhythm: Normal rate.  ?   Comments: Occasional PVC. ?Pulmonary:  ?   Effort: Pulmonary effort is normal.  ?   Comments: Mild coarse expiratory wheezing. No resp distress, accessory muscle use, tripoding, nasal flaring, or cyanosis present. She is satting 94% on room air without any increase work of breath. ?Chest:  ?   Chest wall: No tenderness.  ?Abdominal:  ?   General: Abdomen is flat. Bowel sounds are normal.  ?   Palpations: Abdomen is soft.  ?   Tenderness: There is no abdominal tenderness.  ?Musculoskeletal:     ?   General: No deformity.  ?   Cervical back: Normal range of motion.  ?Skin: ?   General:  Skin is warm and dry.  ?Neurological:  ?   General: No focal deficit present.  ?   Mental Status: She is alert. Mental status is at baseline.  ?   Motor: Weakness present.  ?   Comments: Diffuse weakness, no focal deficit noted.  ? ? ?ED Results / Procedures / Treatments   ?Labs ?(all labs ordered are listed, but only abnormal results are displayed) ?Labs Reviewed  ?CBC WITH DIFFERENTIAL/PLATELET - Abnormal; Notable for the following components:  ?    Result Value  ? RBC 3.74 (*)   ? Platelets 438 (*)   ? All other components within normal limits  ?COMPREHENSIVE METABOLIC PANEL -  Abnormal; Notable for the following components:  ? Chloride 97 (*)   ? Glucose, Bld 148 (*)   ? Calcium 8.6 (*)   ? Albumin 3.2 (*)   ? AST 53 (*)   ? Total Bilirubin 1.6 (*)   ? All other components within normal limits  ?BRAIN NATRIURETIC PEPTIDE - Abnormal; Notable for the following components:  ? B Natriuretic Peptide 170.2 (*)   ? All other components within normal limits  ?CBC  ?COMPREHENSIVE METABOLIC PANEL  ?TROPONIN I (HIGH SENSITIVITY)  ?TROPONIN I (HIGH SENSITIVITY)  ? ? ?EKG ?None ? ?Radiology ?DG Chest 2 View ? ?Result Date: 03/03/2022 ?CLINICAL DATA:  Shortness of breath. EXAM: CHEST - 2 VIEW COMPARISON:  Chest x-ray 02/02/2022.  Chest CT 02/03/2022. FINDINGS: Multiple peripheral pulmonary masses are again seen throughout the right lung. These appear increased in size and number when compared to the prior examination. Right hilar enlargement has also increased from prior examination. Left pleural base nodular density is grossly unchanged. No pleural effusion or pneumothorax. Heart size within normal limits. No acute fractures. IMPRESSION: 1. Increasing size and number of peripheral right pulmonary masses. 2. Increasing soft tissue density in the right hilum. Electronically Signed   By: Ronney Asters M.D.   On: 03/03/2022 20:02  ? ?CT Head Wo Contrast ? ?Result Date: 03/03/2022 ?CLINICAL DATA:  History of melanoma with metastatic  disease with new onset fatigue. EXAM: CT HEAD WITHOUT CONTRAST TECHNIQUE: Contiguous axial images were obtained from the base of the skull through the vertex without intravenous contrast. RADIATION DOSE REDUCTION:

## 2022-03-03 NOTE — Telephone Encounter (Signed)
Received call from son Dannell stating that his mother has agreed to go to the Emergency Room.  He just wanted to let us know.  Thanked patient for the update and to keep [name] posted.   ?

## 2022-03-03 NOTE — ED Triage Notes (Signed)
Pt arrived via POV, c/o SOB, vomiting, fatigue, and diarrhea. Currently under tx for melanoma.  ?

## 2022-03-04 ENCOUNTER — Inpatient Hospital Stay (HOSPITAL_COMMUNITY): Payer: Medicare Other

## 2022-03-04 ENCOUNTER — Other Ambulatory Visit: Payer: Self-pay

## 2022-03-04 DIAGNOSIS — Z515 Encounter for palliative care: Secondary | ICD-10-CM

## 2022-03-04 DIAGNOSIS — Z6825 Body mass index (BMI) 25.0-25.9, adult: Secondary | ICD-10-CM | POA: Diagnosis not present

## 2022-03-04 DIAGNOSIS — Z89022 Acquired absence of left finger(s): Secondary | ICD-10-CM | POA: Diagnosis not present

## 2022-03-04 DIAGNOSIS — Z8719 Personal history of other diseases of the digestive system: Secondary | ICD-10-CM | POA: Diagnosis not present

## 2022-03-04 DIAGNOSIS — Z923 Personal history of irradiation: Secondary | ICD-10-CM | POA: Diagnosis not present

## 2022-03-04 DIAGNOSIS — C7802 Secondary malignant neoplasm of left lung: Secondary | ICD-10-CM | POA: Diagnosis present

## 2022-03-04 DIAGNOSIS — Z888 Allergy status to other drugs, medicaments and biological substances status: Secondary | ICD-10-CM | POA: Diagnosis not present

## 2022-03-04 DIAGNOSIS — R197 Diarrhea, unspecified: Secondary | ICD-10-CM | POA: Diagnosis present

## 2022-03-04 DIAGNOSIS — G936 Cerebral edema: Secondary | ICD-10-CM | POA: Diagnosis present

## 2022-03-04 DIAGNOSIS — R64 Cachexia: Secondary | ICD-10-CM | POA: Diagnosis present

## 2022-03-04 DIAGNOSIS — Z96653 Presence of artificial knee joint, bilateral: Secondary | ICD-10-CM | POA: Diagnosis present

## 2022-03-04 DIAGNOSIS — C439 Malignant melanoma of skin, unspecified: Secondary | ICD-10-CM | POA: Diagnosis present

## 2022-03-04 DIAGNOSIS — J910 Malignant pleural effusion: Secondary | ICD-10-CM | POA: Diagnosis present

## 2022-03-04 DIAGNOSIS — R630 Anorexia: Secondary | ICD-10-CM | POA: Diagnosis present

## 2022-03-04 DIAGNOSIS — R5381 Other malaise: Secondary | ICD-10-CM

## 2022-03-04 DIAGNOSIS — C7931 Secondary malignant neoplasm of brain: Secondary | ICD-10-CM | POA: Diagnosis present

## 2022-03-04 DIAGNOSIS — Z79899 Other long term (current) drug therapy: Secondary | ICD-10-CM | POA: Diagnosis not present

## 2022-03-04 DIAGNOSIS — Z66 Do not resuscitate: Secondary | ICD-10-CM | POA: Diagnosis present

## 2022-03-04 DIAGNOSIS — D75839 Thrombocytosis, unspecified: Secondary | ICD-10-CM | POA: Diagnosis present

## 2022-03-04 DIAGNOSIS — Z90710 Acquired absence of both cervix and uterus: Secondary | ICD-10-CM | POA: Diagnosis not present

## 2022-03-04 DIAGNOSIS — C7801 Secondary malignant neoplasm of right lung: Secondary | ICD-10-CM | POA: Diagnosis present

## 2022-03-04 DIAGNOSIS — J9610 Chronic respiratory failure, unspecified whether with hypoxia or hypercapnia: Secondary | ICD-10-CM | POA: Diagnosis present

## 2022-03-04 DIAGNOSIS — R627 Adult failure to thrive: Secondary | ICD-10-CM | POA: Diagnosis present

## 2022-03-04 DIAGNOSIS — Z87891 Personal history of nicotine dependence: Secondary | ICD-10-CM | POA: Diagnosis not present

## 2022-03-04 LAB — COMPREHENSIVE METABOLIC PANEL
ALT: 29 U/L (ref 0–44)
AST: 41 U/L (ref 15–41)
Albumin: 2.8 g/dL — ABNORMAL LOW (ref 3.5–5.0)
Alkaline Phosphatase: 57 U/L (ref 38–126)
Anion gap: 11 (ref 5–15)
BUN: 16 mg/dL (ref 8–23)
CO2: 25 mmol/L (ref 22–32)
Calcium: 8.1 mg/dL — ABNORMAL LOW (ref 8.9–10.3)
Chloride: 100 mmol/L (ref 98–111)
Creatinine, Ser: 0.65 mg/dL (ref 0.44–1.00)
GFR, Estimated: >60 mL/min (ref >60)
Glucose, Bld: 107 mg/dL — ABNORMAL HIGH (ref 70–99)
Potassium: 4.2 mmol/L (ref 3.5–5.1)
Sodium: 136 mmol/L (ref 135–145)
Total Bilirubin: 1.4 mg/dL — ABNORMAL HIGH (ref 0.3–1.2)
Total Protein: 5.8 g/dL — ABNORMAL LOW (ref 6.5–8.1)

## 2022-03-04 LAB — CBC
HCT: 34.1 % — ABNORMAL LOW (ref 36.0–46.0)
Hemoglobin: 10.9 g/dL — ABNORMAL LOW (ref 12.0–15.0)
MCH: 32.2 pg (ref 26.0–34.0)
MCHC: 32 g/dL (ref 30.0–36.0)
MCV: 100.6 fL — ABNORMAL HIGH (ref 80.0–100.0)
Platelets: 401 10*3/uL — ABNORMAL HIGH (ref 150–400)
RBC: 3.39 MIL/uL — ABNORMAL LOW (ref 3.87–5.11)
RDW: 13 % (ref 11.5–15.5)
WBC: 7.9 10*3/uL (ref 4.0–10.5)
nRBC: 0 % (ref 0.0–0.2)

## 2022-03-04 IMAGING — CT CT CHEST-ABD-PELV W/ CM
2 of 5 series · 12 of 36 positions shown, 14 images · IV contrast (agent unspecified)
Comparison: Multiple previous imaging studies. The most recent
chest CT was [DATE]. Most recent PET-CT is [DATE]

CLINICAL DATA: Metastatic melanoma.  Assess treatment response.

EXAM:
CT CHEST, ABDOMEN, AND PELVIS WITH CONTRAST
TECHNIQUE: Multidetector CT imaging of the chest, abdomen and pelvis was
performed following the standard protocol during bolus
administration of intravenous contrast.

[Series 2: cap with · axial · 0.75mm/px · z∈[-752,-257]mm · 9 of 125 slices shown, 11 images]
[im 13/125  mediastinal]
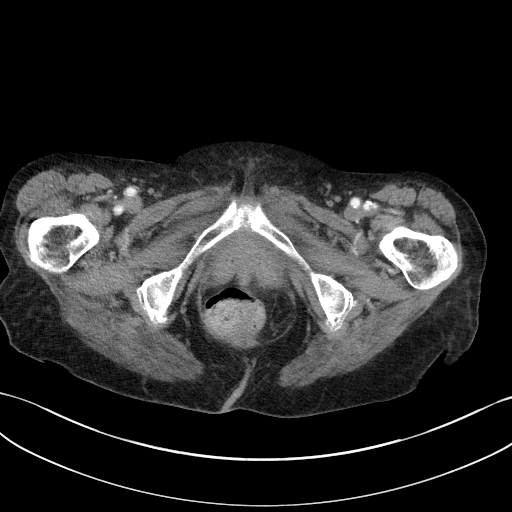
[im 13/125  bone]
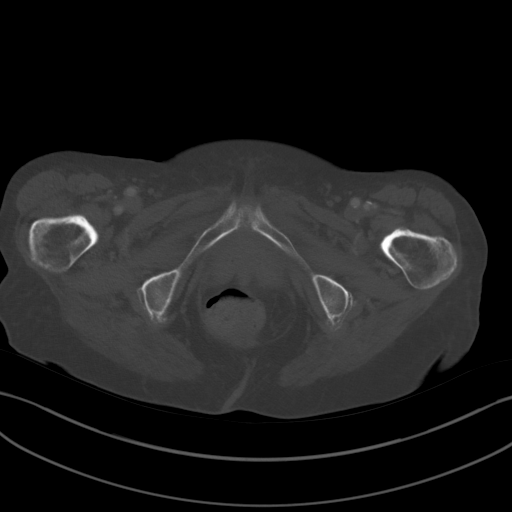
[im 25/125  mediastinal]
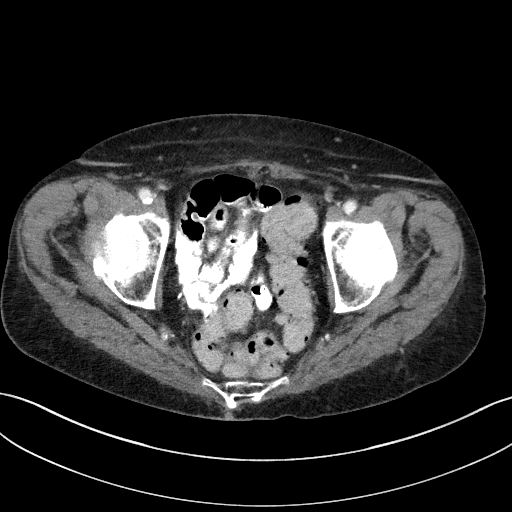
[im 38/125  mediastinal]
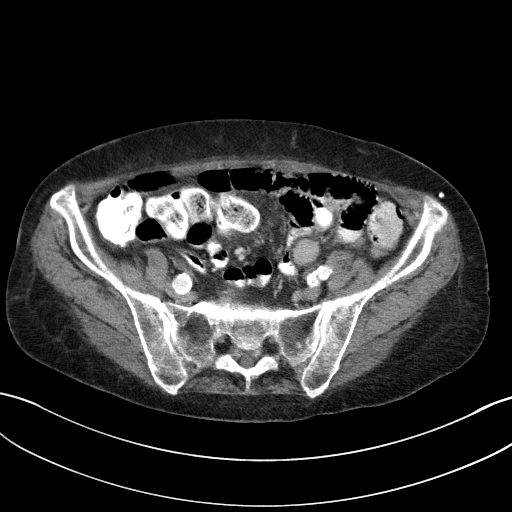
[im 50/125  mediastinal]
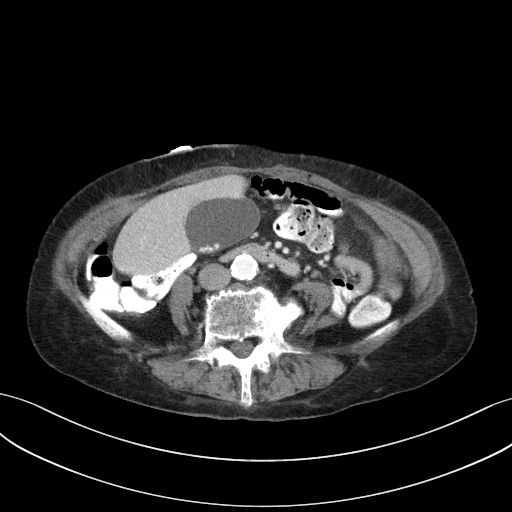
[im 63/125  mediastinal]
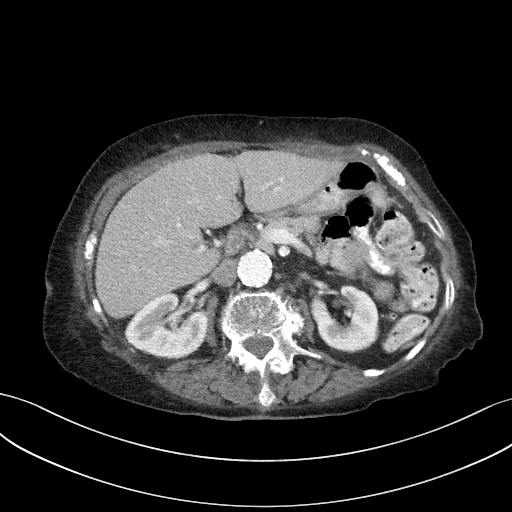
[im 75/125  mediastinal]
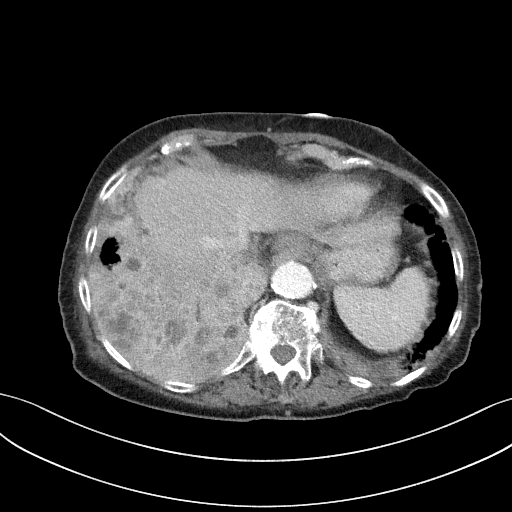
[im 87/125  mediastinal]
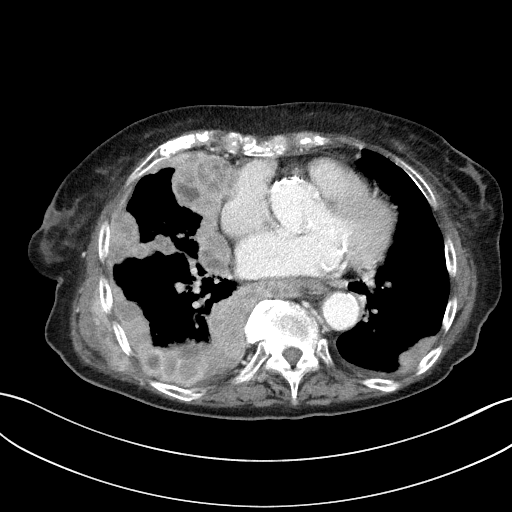
[im 100/125  mediastinal]
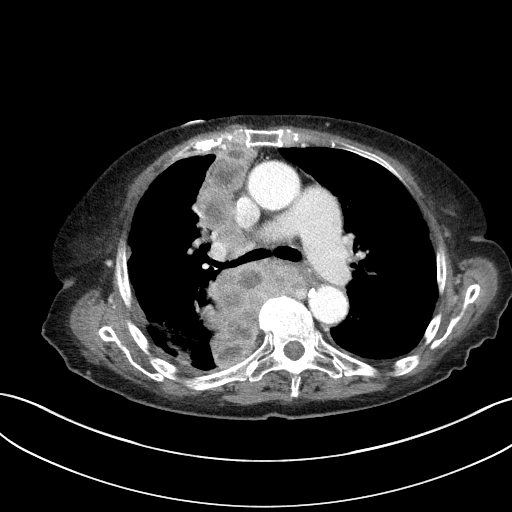
[im 112/125  mediastinal]
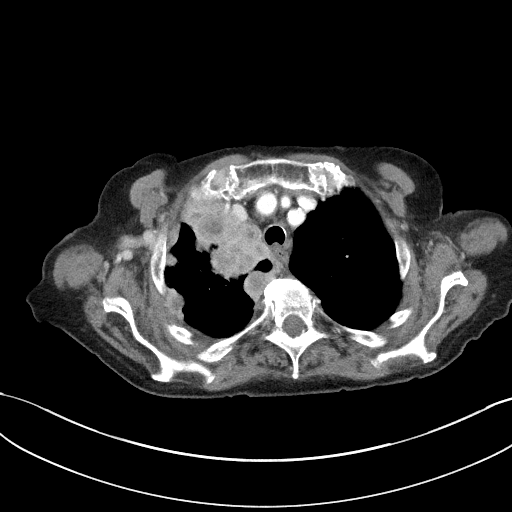
[im 112/125  bone]
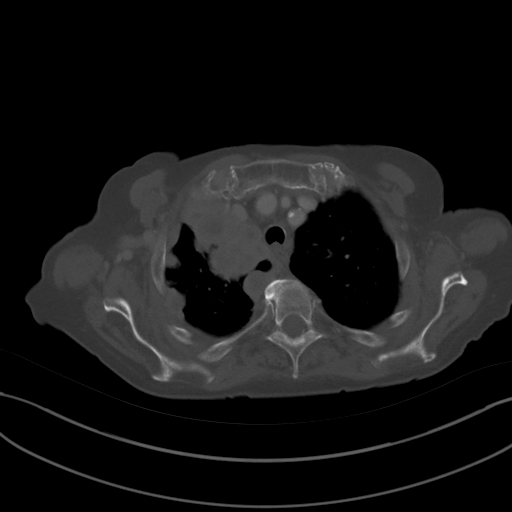

[Series 4: coronals · coronal · 0.69mm/px · 3 of 132 slices shown]
[im 27/132  mediastinal]
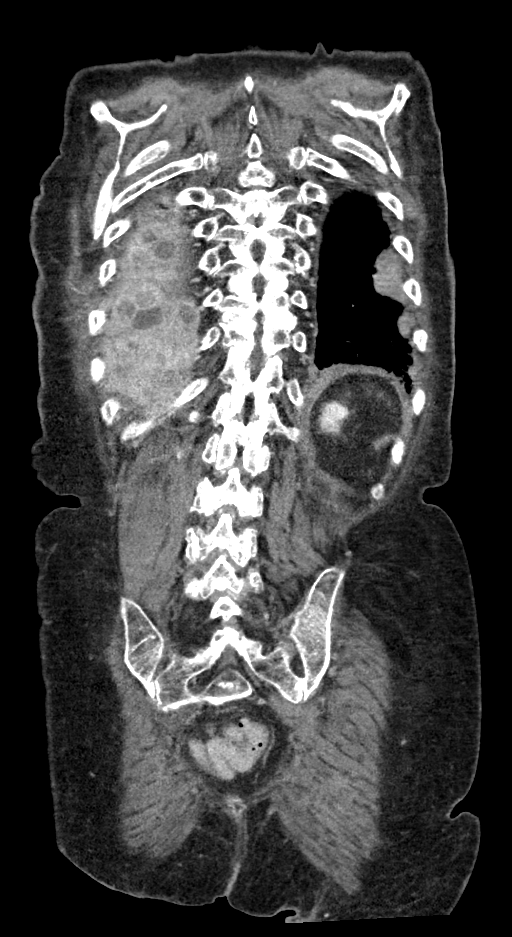
[im 53/132  mediastinal]
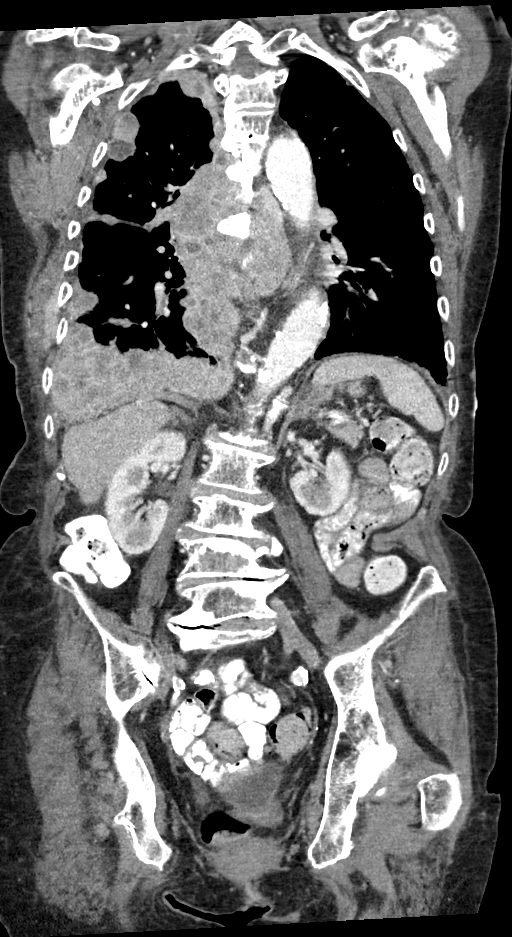
[im 79/132  mediastinal]
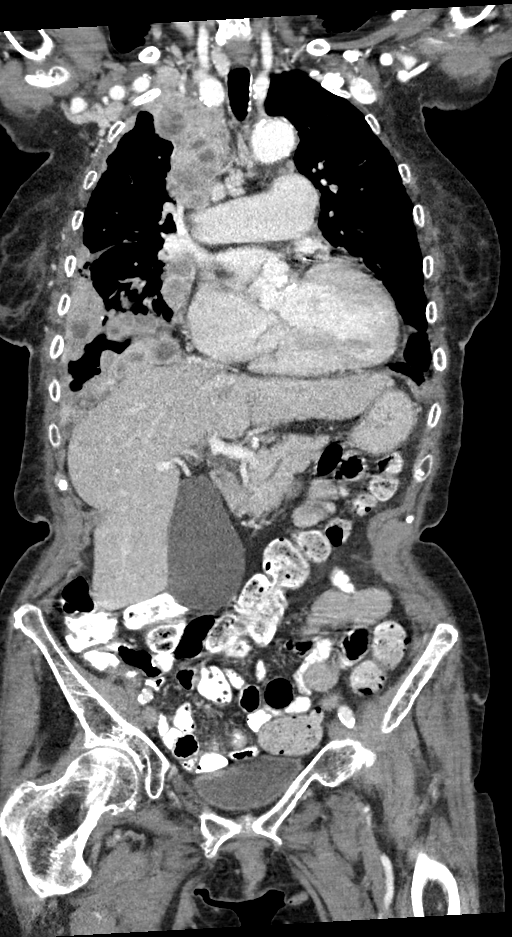

[12 of 36 positions shown; findings below may reference images not displayed]

RADIATION DOSE REDUCTION: This exam was performed according to the
departmental dose-optimization program which includes automated
exposure control, adjustment of the mA and/or kV according to
patient size and/or use of iterative reconstruction technique.

CONTRAST:  100mL OMNIPAQUE IOHEXOL 300 MG/ML  SOLN
FINDINGS: CT CHEST FINDINGS

Cardiovascular: The heart is within normal limits in size and
stable. No pericardial effusion. Stable tortuosity and calcification
of the thoracic aorta but no focal aneurysm or dissection.

Mediastinum/Nodes: Progression of mediastinal lymphadenopathy.
Precarinal node measures 12.5 mm on image [DATE] previously measured
9.5 mm

Large subcarinal nodal mass confluent with the pleural disease has a
maximum transverse diameter of 9 cm on image [DATE]. This was
previously 5.8 cm.

The esophagus is grossly normal.

Lungs/Pleura: Significant interval progression of extensive
right-sided pleural disease which demonstrates areas of contrast
enhancement and also nonenhancing necrosis.

Progressive pleural disease at the left lung base with the largest
focus measuring 4.0 x 1.4 cm on image 37/2. This previously measured
3.2 x 1.0 cm.

New and enlarging pulmonary nodules. 12.5 mm nodule in the right
upper lobe on image number 76/6 previously measured 6 mm.

New 3.5 mm nodule in the left upper lobe on image number 38/6.

10 mm left lower lobe nodule on image 90/6 is stable.

Musculoskeletal: No obvious bone lesions.

Enlarging subcutaneous lesion in the medial aspect of the left
breast near the midline on image 41/2. This measures 7 mm and
previously measured 4 mm.

Large enhancing lesion in the right lower chest wall mainly
involving the surround is anterior muscle. This measures
approximately 6.8 x 1.8 cm on image 41/2. Difficult to see this well
prior study without contrast. Was not present on the prior PET-CT.

11 x 7 mm subcutaneous lesion noted just superficial to the
latissimus dorsi muscle on image [DATE].

CT ABDOMEN PELVIS FINDINGS

Hepatobiliary: No hepatic metastatic lesions are identified. No
intrahepatic biliary dilatation. Stable gallstones in the
gallbladder. No common bile duct dilatation.

Pancreas: No mass, inflammation or ductal dilatation.

Spleen: Normal size.  No focal lesions.

Adrenals/Urinary Tract: No definite adrenal gland lesions. Both
kidneys are unremarkable. The bladder is unremarkable.

Stomach/Bowel: The stomach, duodenum, small bowel and colon are
grossly normal. No acute inflammatory process, mass lesions or
obstructive findings.

Vascular/Lymphatic: Stable advanced atherosclerotic calcifications
involving the aorta and iliac arteries but no aneurysm. No
mesenteric or retroperitoneal mass or adenopathy.

Reproductive: The uterus and ovaries are surgically absent.

Other: No free air or free fluid. No omental or peritoneal nodules
are identified.

Musculoskeletal: No lytic or sclerotic bone disease.
IMPRESSION: 1. Interval progression of pleural and pulmonary metastatic disease.
2. Progression of mediastinal lymphadenopathy.
3. New or enlarging enhancing lesion in the right lower chest wall
mainly involving the serratus anterior muscle.
4. Enlarging subcutaneous metastasis.
5. No findings for metastatic disease involving the abdomen or
pelvis.
6. Cholelithiasis.
7. Stable advanced atherosclerotic calcifications involving the
aorta and iliac arteries.

* Tracking Code: BO *

Aortic Atherosclerosis ([S1]-[S1]).

## 2022-03-04 IMAGING — MR MR HEAD WO/W CM
10 of 15 series · 35 of 48 positions shown · IV contrast (gadavist)
Comparison: [DATE].  [DATE].  [DATE].

CLINICAL DATA: Metastatic melanoma. Multiple brain metastases.
Previous radiation therapy. Assess for new or progressive disease.

EXAM:
MRI HEAD WITHOUT AND WITH CONTRAST
TECHNIQUE: Multiplanar, multiecho pulse sequences of the brain and surrounding
structures were obtained without and with intravenous contrast.
CONTRAST:  6mL GADAVIST GADOBUTROL 1 MMOL/ML IV SOLN

[Series 5: DWI · axial · 3.0mm · 1.36mm/px · z∈[-43,+98]mm · 6 of 96 slices shown (1 of 2)]
[im 1/96]
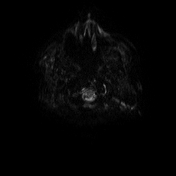
[im 20/96]
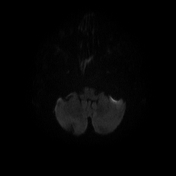
[im 39/96]
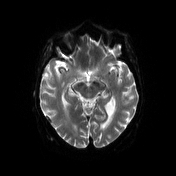
[im 58/96]
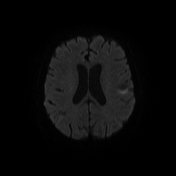
[im 77/96]
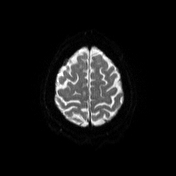
[im 96/96]
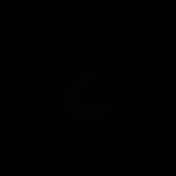

[Series 6: DWI · axial · 3.0mm · 1.36mm/px · z∈[-43,+98]mm · 3 of 48 slices shown (2 of 2)]
[im 1/48]
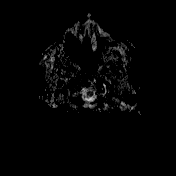
[im 24/48]
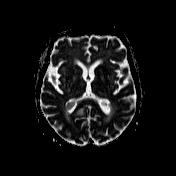
[im 48/48]
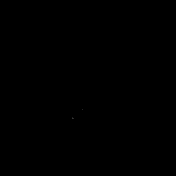

[Series 7: T1 · sagittal · 5.0mm · 0.75mm/px · 1 of 24 slices shown (1 of 2)]
[im 1/24]
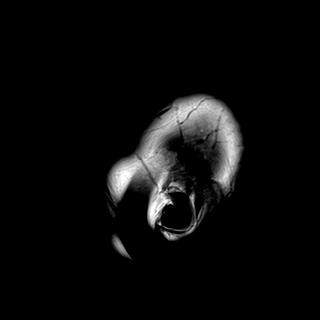

[Series 8: T2 · axial · 5.0mm · 0.62mm/px · 1 of 23 slices shown]
[im 1/23]
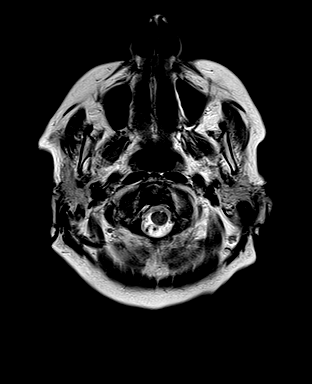

[Series 9: FLAIR · axial · 3.0mm · 0.75mm/px · z∈[-50,+102]mm · 3 of 52 slices shown]
[im 1/52]
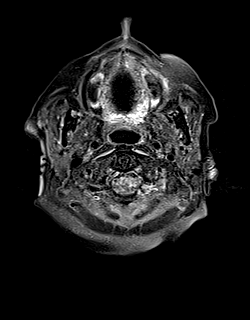
[im 26/52]
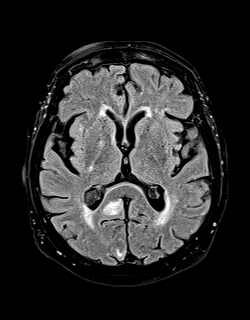
[im 52/52]
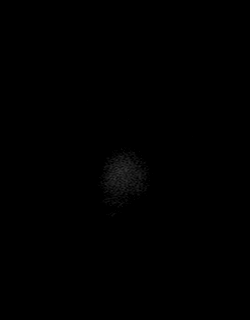

[Series 10: T1 · axial · 1.0mm · 0.47mm/px · z∈[-46,+79]mm · 7 of 144 slices shown (2 of 2)]
[im 1/144]
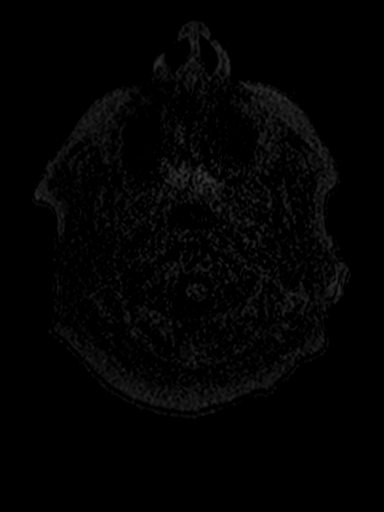
[im 18/144]
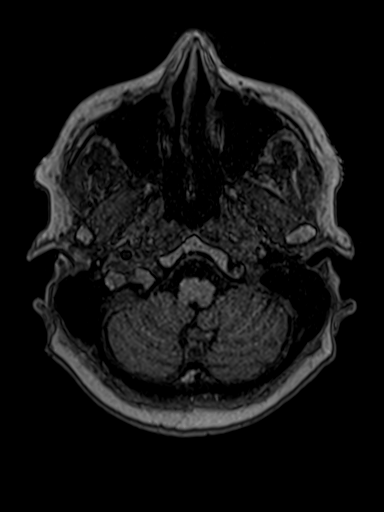
[im 36/144]
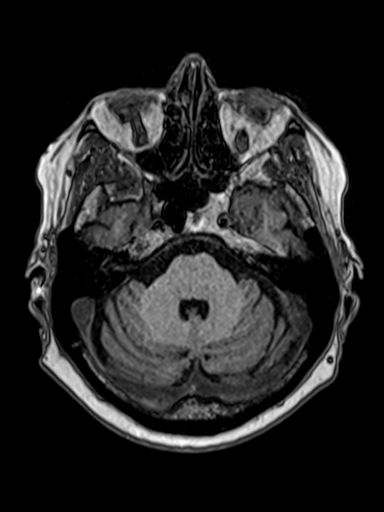
[im 54/144]
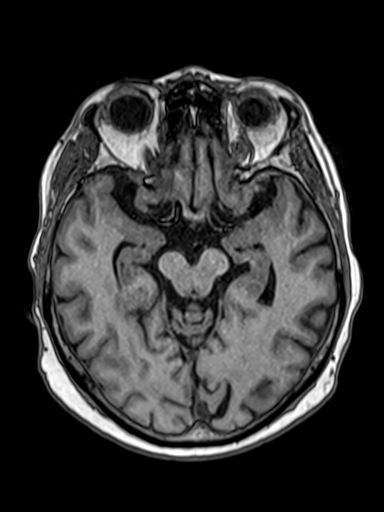
[im 90/144]
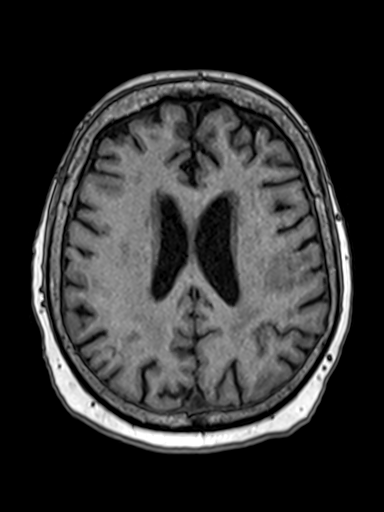
[im 108/144]
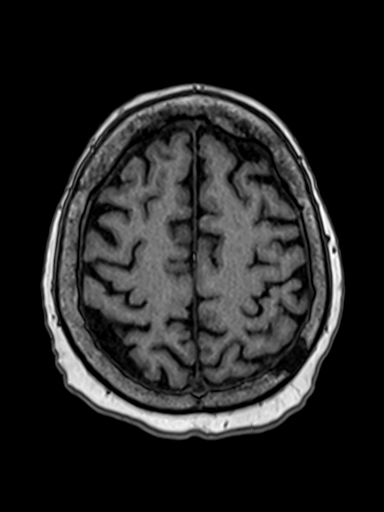
[im 126/144]
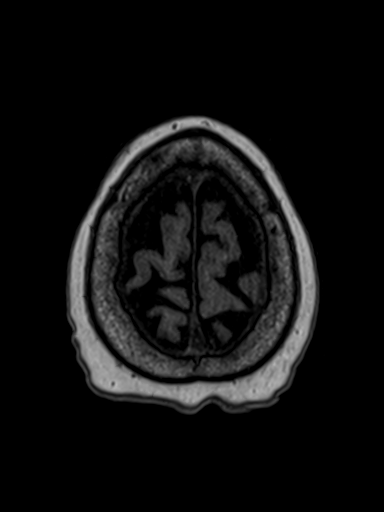

[Series 15: T2 post-contrast · coronal · 5.0mm · 0.57mm/px · 2 of 26 slices shown]
[im 1/26]
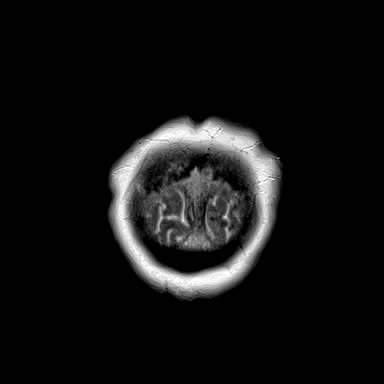
[im 26/26]
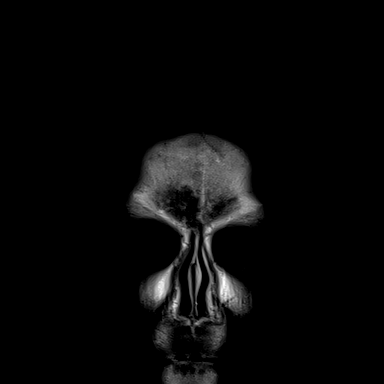

[Series 16: T1 post-contrast · axial · 1.0mm · 0.47mm/px · z∈[-46,+97]mm · 9 of 144 slices shown (1 of 3)]
[im 1/144]
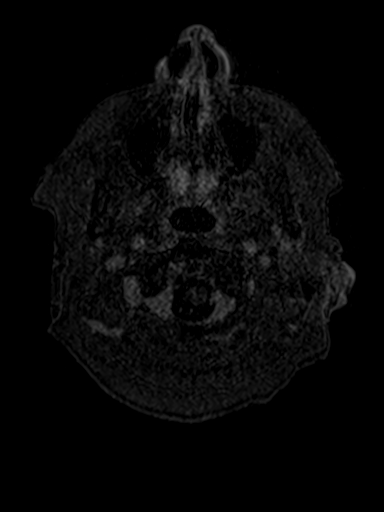
[im 18/144]
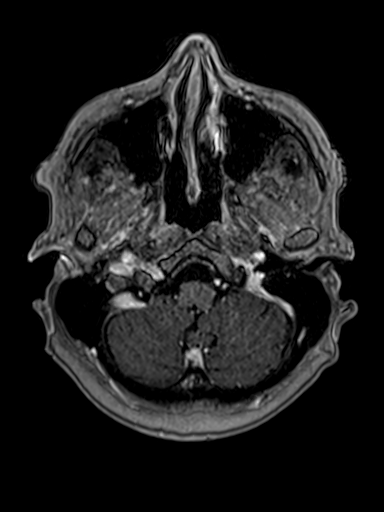
[im 36/144]
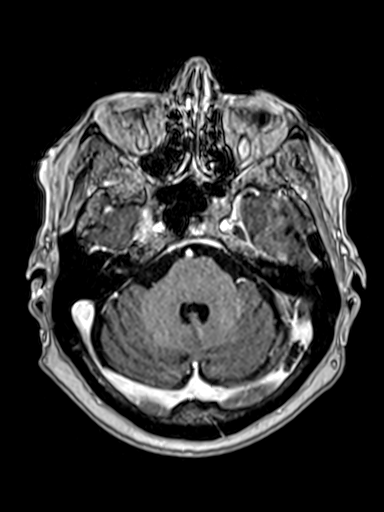
[im 54/144]
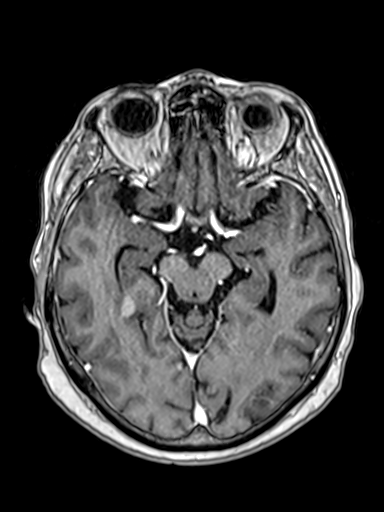
[im 72/144]
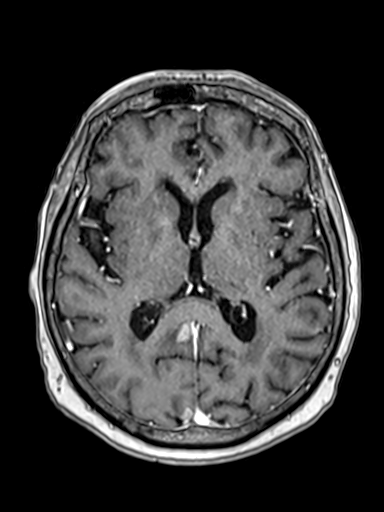
[im 90/144]
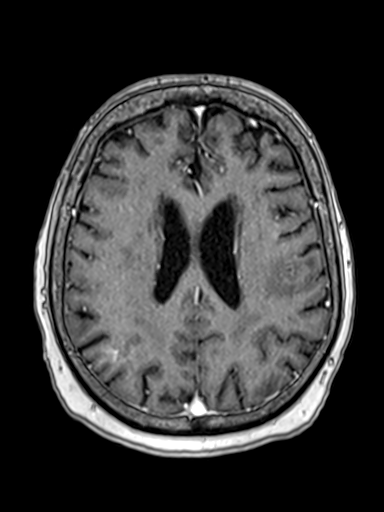
[im 108/144]
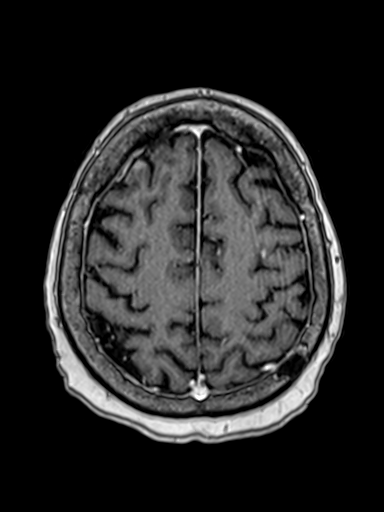
[im 126/144]
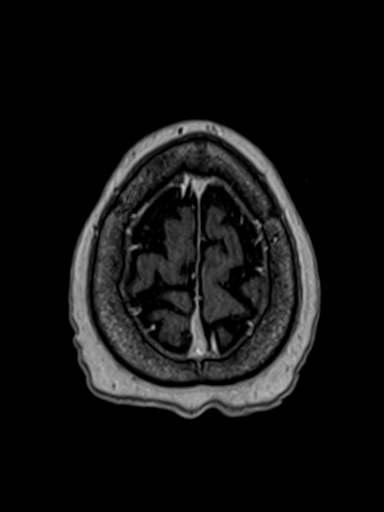
[im 144/144]
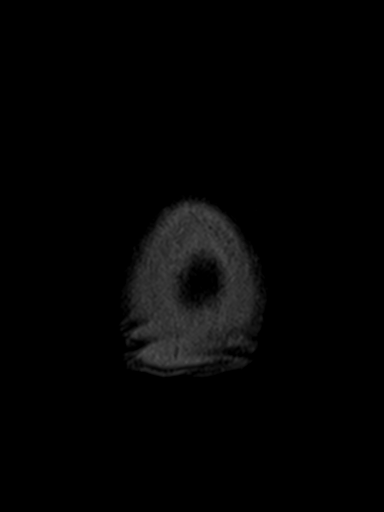

[Series 19: T1 post-contrast · coronal · 5.0mm · 0.43mm/px · 2 of 26 slices shown (2 of 3)]
[im 1/26]
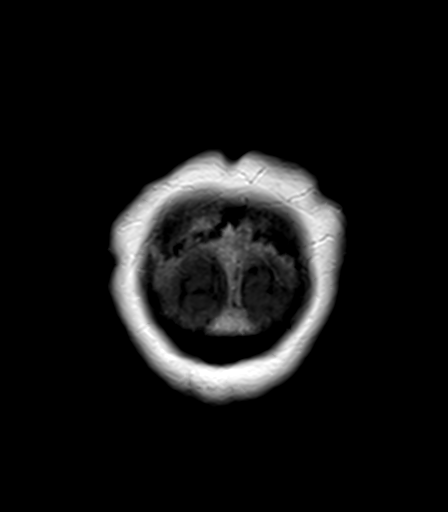
[im 26/26]
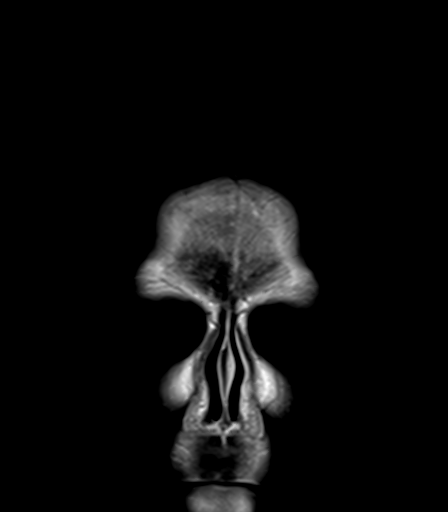

[Series 20: T1 post-contrast · sagittal · 5.0mm · 0.75mm/px · 1 of 24 slices shown (3 of 3)]
[im 1/24]
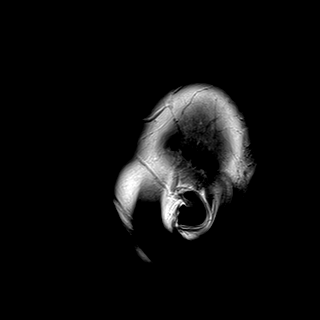

[35 of 48 positions shown; findings below may reference images not displayed]

FINDINGS: Brain: No metastatic lesion seen affecting the brainstem or
cerebellum.

Multiple metastatic lesions in the right hemisphere. Enlarging
lesions are marked by double arrows and present as follows: In the
right temporal lobe axial image 54. Right inferior frontal lobe,
axial image 60. right basal ganglia axial image 74. 2 lesions in the
right frontal lobe axial image 80. Right frontal lobe axial image
121. The degree of enlargement is minor, only a few mm in each of
these locations. The degree of hemorrhage is unchanged.

Multiple metastatic lesions in the left hemisphere. Enlarging
lesions are marked with double arrows and present as follows: 2
lesions in the left inferior temporal lobe axial image 38. Medial
posterior left temporal lobe axial image 47. Left basal ganglia
axial image 68. Left posterior insula image 80. Left frontal
operculum image 89. Two lesions in the left frontal lobe axial image
96. Left medial parietal lobe axial image 99. Left frontal lobe
axial image 120. As on the right, the degree of enlargement is only
a few mm in each of these lesions. The degree of hemorrhage is
unchanged.

No significant change in the edema pattern. Relative lack of mass
effect. No hydrocephalus.

Vascular: Major vessels at the base of the brain show flow.

Skull and upper cervical spine: Negative. Benign appearing left
parietal calvarial lesion as seen previously.

Sinuses/Orbits: Clear/normal

Other: None
IMPRESSION: Disease progression since the immediate prior exam. 6 new or
enlarging lesions in the right hemisphere as described and marked by
double arrows. Ten new or enlarging lesions in the left hemisphere
as described and marked by double arrows. No significant change in
edema pattern or associated blood products.

## 2022-03-04 MED ORDER — LOPERAMIDE HCL 2 MG PO CAPS: 2.0000 mg | ORAL_CAPSULE | ORAL | Status: DC | PRN

## 2022-03-04 MED ORDER — GADOBUTROL 1 MMOL/ML IV SOLN
6.0000 mL | Freq: Once | INTRAVENOUS | Status: AC | PRN
  Administered 2022-03-04: 6 mL via INTRAVENOUS

## 2022-03-04 MED ORDER — ONDANSETRON HCL 4 MG/2ML IJ SOLN: 4.0000 mg | Freq: Four times a day (QID) | INTRAMUSCULAR | Status: DC | PRN

## 2022-03-04 MED ORDER — IOHEXOL 9 MG/ML PO SOLN
500.0000 mL | ORAL | Status: AC
  Administered 2022-03-04: 500 mL via ORAL

## 2022-03-04 MED ORDER — DEXAMETHASONE 4 MG PO TABS
4.0000 mg | ORAL_TABLET | Freq: Two times a day (BID) | ORAL | Status: DC
  Administered 2022-03-04: 4 mg via ORAL
  Filled 2022-03-04: qty 1

## 2022-03-04 MED ORDER — IOHEXOL 9 MG/ML PO SOLN
ORAL | Status: AC
  Administered 2022-03-04: 500 mL via ORAL
  Filled 2022-03-04: qty 1000

## 2022-03-04 MED ORDER — SODIUM CHLORIDE (PF) 0.9 % IJ SOLN
INTRAMUSCULAR | Status: AC
  Administered 2022-03-04: 3 mL via INTRAVENOUS
  Filled 2022-03-04: qty 50

## 2022-03-04 MED ORDER — FAMOTIDINE 20 MG PO TABS
20.0000 mg | ORAL_TABLET | Freq: Every day | ORAL | Status: DC
  Administered 2022-03-04: 20 mg via ORAL
  Filled 2022-03-04: qty 1

## 2022-03-04 MED ORDER — MEGESTROL ACETATE 400 MG/10ML PO SUSP
400.0000 mg | Freq: Two times a day (BID) | ORAL | Status: DC
  Administered 2022-03-04 – 2022-03-06 (×5): 400 mg via ORAL
  Filled 2022-03-04 (×5): qty 10

## 2022-03-04 MED ORDER — IOHEXOL 300 MG/ML  SOLN
100.0000 mL | Freq: Once | INTRAMUSCULAR | Status: AC | PRN
  Administered 2022-03-04: 100 mL via INTRAVENOUS

## 2022-03-04 NOTE — Assessment & Plan Note (Addendum)
Unfortunately patient has had progression to brain mets despite of previous therapy.  Seen by oncology.  Recommend transitioning patient to hospice, patient is agreeable.  ?

## 2022-03-04 NOTE — Progress Notes (Signed)
?PROGRESS NOTE ? ? ? ?Peggy Howard  HQI:[id number] DOB: 07-12-1933 DOA: 03/03/2022 ?PCP: Pcp, No  ?Chief Complaint  ?Patient presents with  ? Shortness of Breath  ? Emesis  ? ? ?Brief Narrative:  ?Peggy Howard is Peggy Howard 88 y.o. 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  Currently undergoing workup with Dr. Marin Olp including CT CAP (showing progression of pleural and pulm metastatic disease, progression of mediastinal LAD, new or enhancing lesion in R lower chest wall mainly involving the serratus anterior, and enlarging subcutaneous metastasis).   Her MRI brain is pending.  There were discussions regarding goals of care and possible transition to comfort measures/hospice, but these discussions are currently pending completion of workup per Dr. Marin Olp and further recs.  Palliative care c/s as well.  ? ? ?Assessment & Plan: ?  ?Principal Problem: ?  Admission for palliative care ?Active Problems: ?  Nausea vomiting and diarrhea ?  Metastatic melanoma ([place]) ?  Malignant neoplasm metastatic to brain [facility]) ?  Melanoma metastatic to lung [facility]) ?  Malaise ?  Malignant pleural effusion ? ? ?Assessment and Plan: ?Metastatic melanoma ([place]) ?To brain, lung ?CT CAP shows progression of mediastinal LAD as well as new or enlarging enhancing lesion in the right lower chest wall mainly involving the serratus anterior muscle.  Enlarging subcutaneous mets. ?MRI brain is currently pending ?Appreciate Dr. Marin Olp recommendation, she was started on nilotinib about 3 weeks ago ?Will consider discussion with rad onc regarding brain meds ?Regarding goals of care, will follow current workup and see options per Dr. Marin Olp.  There was mention of likely transition to hospice/comfort measures by admitting provider, but will first follow  her pending imaging and follow up with Dr. Marin Olp +/- discussion with rad onc before making decisions on next steps.  ?Palliative care has been consulted ?Dronabinol, meclizine prn ? ?Nausea vomiting and diarrhea ?Continue IVF, megace ?Symptomatic management ? ? ? ?DVT prophylaxis: SCD ?Code Status: dnr ?Family Communication: none ?Disposition:  ? ?Status is: Inpatient ?Remains inpatient appropriate because: awaiting further studies, goals of care conversations ?  ?Consultants:  ?Oncology ?palliative ? ?Procedures:  ?none ? ?Antimicrobials:  ?Anti-infectives (From admission, onward)  ? ? None  ? ?  ? ? ?Subjective: ?No new complaitns ? ?Objective: ?Vitals:  ? 03/04/22 0134 03/04/22 0505 03/04/22 1028 03/04/22 1333  ?BP: (!) 172/72 129/61 (!) 119/50 137/70  ?Pulse: 77 77 80 84  ?Resp: (!) 24 20 16 16   ?Temp: 98.6 ?F (37 ?C) 98.4 ?F (36.9 ?C) 98.9 ?F (37.2 ?C) 98.8 ?F (37.1 ?C)  ?TempSrc: Oral Oral Oral Oral  ?SpO2: (!) 89% 90% (!) 89% 90%  ?Weight:      ?Height:      ? ? ?Intake/Output Summary (Last 24 hours) at 03/04/2022 1556 ?Last data filed at 03/04/2022 1037 ?Gross per 24 hour  ?Intake 720.21 ml  ?Output --  ?Net 720.21 ml  ? ?Filed Weights  ? 03/03/22 1812  ?Weight: 68.5 kg  ? ? ?Examination: ? ?General exam: Appears calm and comfortable  ?Respiratory system: unlabored ?Cardiovascular system: RRR ?Gastrointestinal system: Abdomen is nondistended, soft and nontender.  ?Central nervous system: hard of hearing, moving all extremities ?Extremities: no LEE ? ? ? ?Data Reviewed: I have personally reviewed following labs and imaging studies ? ?CBC: ?Recent Labs  ?Lab 03/03/22 ?1839 03/04/22 ?1324  ?WBC 9.2 7.9  ?  NEUTROABS 7.4  --   ?HGB 12.3 10.9*  ?HCT 37.3 34.1*  ?MCV 99.7 100.6*  ?PLT 438* 401*  ? ? ?Basic Metabolic Panel: ?Recent Labs  ?Lab 03/03/22 ?1839 03/04/22 ?6144  ?NA 135 136  ?K 4.0 4.2  ?CL 97* 100  ?CO2 28 25  ?GLUCOSE 148* 107*  ?BUN 22 16  ?CREATININE 0.77 0.65  ?CALCIUM 8.6* 8.1*  ? ? ?GFR: ?Estimated  Creatinine Clearance: 49.1 mL/min (by C-G formula based on SCr of 0.65 mg/dL). ? ?Liver Function Tests: ?Recent Labs  ?Lab 03/03/22 ?1839 03/04/22 ?3154  ?AST 53* 41  ?ALT 36 29  ?ALKPHOS 69 57  ?BILITOT 1.6* 1.4*  ?PROT 7.0 5.8*  ?ALBUMIN 3.2* 2.8*  ? ? ?CBG: ?No results for input(s): GLUCAP in the last 168 hours. ? ? ?No results found for this or any previous visit (from the past 240 hour(s)).  ? ? ? ? ? ?Radiology Studies: ?DG Chest 2 View ? ?Result Date: 03/03/2022 ?CLINICAL DATA:  Shortness of breath. EXAM: CHEST - 2 VIEW COMPARISON:  Chest x-ray 02/02/2022.  Chest CT 02/03/2022. FINDINGS: Multiple peripheral pulmonary masses are again seen throughout the right lung. These appear increased in size and number when compared to the prior examination. Right hilar enlargement has also increased from prior examination. Left pleural base nodular density is grossly unchanged. No pleural effusion or pneumothorax. Heart size within normal limits. No acute fractures. IMPRESSION: 1. Increasing size and number of peripheral right pulmonary masses. 2. Increasing soft tissue density in the right hilum. Electronically Signed   By: Ronney Asters M.D.   On: 03/03/2022 20:02  ? ?CT Head Wo Contrast ? ?Result Date: 03/03/2022 ?CLINICAL DATA:  History of melanoma with metastatic disease with new onset fatigue. EXAM: CT HEAD WITHOUT CONTRAST TECHNIQUE: Contiguous axial images were obtained from the base of the skull through the vertex without intravenous contrast. RADIATION DOSE REDUCTION: This exam was performed according to the departmental dose-optimization program which includes automated exposure control, adjustment of the mA and/or kV according to patient size and/or use of iterative reconstruction technique. COMPARISON:  MRI from 12/20/21 as well as 02/03/2022 FINDINGS: Brain: Multiple areas of increased attenuation are identified throughout both cerebral hemispheres similar to that seen on recent MRI consistent with metastatic  disease. Largest of these lies in the left frontal parietal region measuring up to 13 mm. Scattered smaller lesions are noted. Vascular: No hyperdense vessel or unexpected calcification. Skull: No acute abnormality noted. Benign lucency again identified in the parietal bone. Sinuses/Orbits: No acute finding. Other: None. IMPRESSION: Changes consistent with metastatic disease throughout both cerebral hemispheres. The largest of these lesions is noted on the left as described measuring up to 13 mm. This is increased in size when compared with the recent MRI. The increased density may represent some focal interval hemorrhage. Follow-up with contrast enhanced MRI may be helpful. Electronically Signed   By: Inez Catalina M.D.   On: 03/03/2022 21:07  ? ?MR BRAIN W WO CONTRAST ? ?Result Date: 03/04/2022 ?CLINICAL DATA:  Metastatic melanoma. Multiple brain metastases. Previous radiation therapy. Assess for new or progressive disease. EXAM: MRI HEAD WITHOUT AND WITH CONTRAST TECHNIQUE: Multiplanar, multiecho pulse sequences of the brain and surrounding structures were obtained without and with intravenous contrast. CONTRAST:  54mL GADAVIST GADOBUTROL 1 MMOL/ML IV SOLN COMPARISON:  02/03/2022.  12/20/2021.  09/06/2021. FINDINGS: Brain: No metastatic lesion seen affecting the brainstem or cerebellum. Multiple metastatic lesions in the right hemisphere. Enlarging lesions are marked by double arrows  and present as follows: In the right temporal lobe axial image 54. Right inferior frontal lobe, axial image 60. right basal ganglia axial image 74. 2 lesions in the right frontal lobe axial image 80. Right frontal lobe axial image 121. The degree of enlargement is minor, only Haniel Fix few mm in each of these locations. The degree of hemorrhage is unchanged. Multiple metastatic lesions in the left hemisphere. Enlarging lesions are marked with double arrows and present as follows: 2 lesions in the left inferior temporal lobe axial image 38.  Medial posterior left temporal lobe axial image 47. Left basal ganglia axial image 68. Left posterior insula image 80. Left frontal operculum image 89. Two lesions in the left frontal lobe axial image 96.

## 2022-03-04 NOTE — Hospital Course (Signed)
[name] is Peggy Howard 88 y.o. 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  Currently undergoing workup with Dr. Marin Olp including CT CAP (showing progression of pleural and pulm metastatic disease, progression of mediastinal LAD, new or enhancing lesion in R lower chest wall mainly involving the serratus anterior, and enlarging subcutaneous metastasis).   Her MRI brain is pending.  There were discussions regarding goals of care and possible transition to comfort measures/hospice, but these discussions are currently pending completion of workup per Dr. Marin Olp and further recs.  Palliative care c/s as well. ?

## 2022-03-04 NOTE — ED Notes (Signed)
ED TO INPATIENT HANDOFF REPORT ? ?ED Nurse Name and Phone #: [phone] Erick Colace, RN  ? ?S ?Name/Age/Gender ?Peggy Howard ?88 y.o. ?female ?Room/Bed: WA08/WA08 ? ?Code Status ?  Code Status: DNR ? ?Home/SNF/Other ?Home ?Patient oriented to: self, place, time, and situation ?Is this baseline? Yes  ? ?Triage Complete: Triage complete  ?Chief Complaint ?Admission for palliative care [Z51.5] ? ?Triage Note ?Pt arrived via POV, c/o SOB, vomiting, fatigue, and diarrhea. Currently under tx for melanoma.   ? ?Allergies ?Allergies  ?Allergen Reactions  ? Vitamin D Analogs Other (See Comments)  ?  "dizziness"  ? ? ?Level of Care/Admitting Diagnosis ?ED Disposition   ? ? ED Disposition  ?Admit  ? Condition  ?--  ? Comment  ?Hospital Area: [facility] [[id number]] ? Level of Care: Med-Surg [16] ? May place patient in observation at [facility] or [place] if equivalent level of care is available:: No ? Covid Evaluation: Asymptomatic - no recent exposure (last 10 days) testing not required ? Diagnosis: Admission for palliative care [287172] ? Admitting Physician: Marcelyn Bruins [[id number]] ? Attending Physician: Marcelyn Bruins [[id number]] ?  ?  ? ?  ? ? ?B ?Medical/Surgery History ?Past Medical History:  ?Diagnosis Date  ? Arthritis   ? Goals of care, counseling/discussion 06/21/2021  ? Malignant pleural effusion 06/21/2021  ? Melanoma metastatic to brain [facility])   ? Melanoma metastatic to lung ([place]) 06/21/2021  ? Rectal prolapse   ? ?Past Surgical History:  ?Procedure Laterality Date  ? ABDOMINAL HYSTERECTOMY    ? amputation of left finger     ? 2021  ? BIOPSY  03/27/2021  ? Procedure: BIOPSY;  Surgeon: Irving Copas., MD;  Location: [facility];  Service: Gastroenterology;;  ? COLONOSCOPY WITH PROPOFOL N/A 03/27/2021  ? Procedure: COLONOSCOPY WITH PROPOFOL;  Surgeon: Mansouraty, Telford Nab., MD;  Location: [facility];  Service: Gastroenterology;  Laterality: N/A;  ? JOINT REPLACEMENT     ? POLYPECTOMY  03/27/2021  ? Procedure: POLYPECTOMY;  Surgeon: Rush Landmark Telford Nab., MD;  Location: [facility];  Service: Gastroenterology;;  ? REMOVAL OF PLEURAL DRAINAGE CATHETER  09/02/2021  ? Procedure: REMOVAL OF PLEURAL DRAINAGE CATHETER;  Surgeon: Freddi Starr, MD;  Location: [facility];  Service: Pulmonary;;  ? REPLACEMENT TOTAL KNEE Bilateral   ? XI ROBOT ASSISTED RECTOPEXY N/A 06/12/2021  ? Procedure: XI ROBOT ASSISTED RECTOPEXY;  Surgeon: Leighton Ruff, MD;  Location: [facility];  Service: General;  Laterality: N/A;  ?  ? ?A ?IV Location/Drains/Wounds ?Patient Lines/Drains/Airways Status   ? ? Active Line/Drains/Airways   ? ? Name Placement date Placement time Site Days  ? Peripheral IV 03/03/22 20 G Left Antecubital 03/03/22  1855  Antecubital  1  ? Chest Tube 1 Lateral;Right Pleural 06/27/21  1544  Pleural  250  ? Incision - 5 Ports Abdomen Right;Lateral Right Right;Lower Upper;Mid Left 06/12/21  1001  -- 265  ? ?  ?  ? ?  ? ? ?Intake/Output Last 24 hours ?No intake or output data in the 24 hours ending 03/04/22 0049 ? ?Labs/Imaging ?Results for orders placed or performed during the hospital encounter of 03/03/22 (from the past 48 hour(s))  ?Troponin I (High Sensitivity)     Status: None  ? Collection Time: 03/03/22  6:39 PM  ?Result Value Ref Range  ? Troponin I (High Sensitivity) 7 <18 ng/L  ?  Comment: (NOTE) ?Elevated high sensitivity troponin I (hsTnI) values and significant  ?changes across serial measurements  may suggest ACS but many other  ?chronic and acute conditions are known to elevate hsTnI results.  ?Refer to the "Links" section for chest pain algorithms and additional  ?guidance. ?Performed at [facility], [address] ?[place], [place] [address] ?  ?CBC with Differential     Status: Abnormal  ? Collection Time: 03/03/22  6:39 PM  ?Result Value Ref Range  ? WBC 9.2 4.0 - 10.5 K/uL  ? RBC 3.74 (L) 3.87 - 5.11 MIL/uL  ? Hemoglobin 12.3 12.0 - 15.0 g/dL  ?  HCT 37.3 36.0 - 46.0 %  ? MCV 99.7 80.0 - 100.0 fL  ? MCH 32.9 26.0 - 34.0 pg  ? MCHC 33.0 30.0 - 36.0 g/dL  ? RDW 12.9 11.5 - 15.5 %  ? Platelets 438 (H) 150 - 400 K/uL  ? nRBC 0.0 0.0 - 0.2 %  ? Neutrophils Relative % 79 %  ? Neutro Abs 7.4 1.7 - 7.7 K/uL  ? Lymphocytes Relative 9 %  ? Lymphs Abs 0.8 0.7 - 4.0 K/uL  ? Monocytes Relative 8 %  ? Monocytes Absolute 0.7 0.1 - 1.0 K/uL  ? Eosinophils Relative 2 %  ? Eosinophils Absolute 0.1 0.0 - 0.5 K/uL  ? Basophils Relative 1 %  ? Basophils Absolute 0.1 0.0 - 0.1 K/uL  ? Immature Granulocytes 1 %  ? Abs Immature Granulocytes 0.07 0.00 - 0.07 K/uL  ?  Comment: Performed at [facility], [place] [address], [place], [place] [address]  ?Comprehensive metabolic panel     Status: Abnormal  ? Collection Time: 03/03/22  6:39 PM  ?Result Value Ref Range  ? Sodium 135 135 - 145 mmol/L  ? Potassium 4.0 3.5 - 5.1 mmol/L  ? Chloride 97 (L) 98 - 111 mmol/L  ? CO2 28 22 - 32 mmol/L  ? Glucose, Bld 148 (H) 70 - 99 mg/dL  ?  Comment: Glucose reference range applies only to samples taken after fasting for at least 8 hours.  ? BUN 22 8 - 23 mg/dL  ? Creatinine, Ser 0.77 0.44 - 1.00 mg/dL  ? Calcium 8.6 (L) 8.9 - 10.3 mg/dL  ? Total Protein 7.0 6.5 - 8.1 g/dL  ? Albumin 3.2 (L) 3.5 - 5.0 g/dL  ? AST 53 (H) 15 - 41 U/L  ? ALT 36 0 - 44 U/L  ? Alkaline Phosphatase 69 38 - 126 U/L  ? Total Bilirubin 1.6 (H) 0.3 - 1.2 mg/dL  ? GFR, Estimated >60 >60 mL/min  ?  Comment: (NOTE) ?Calculated using the CKD-EPI Creatinine Equation (2021) ?  ? Anion gap 10 5 - 15  ?  Comment: Performed at [facility], [place] [address], [place], [place] [address]  ?Brain natriuretic peptide     Status: Abnormal  ? Collection Time: 03/03/22  6:41 PM  ?Result Value Ref Range  ? B Natriuretic Peptide 170.2 (H) 0.0 - 100.0 pg/mL  ?  Comment: Performed at [facility], [place] [address], [place], [place] [address]  ?Troponin I (High Sensitivity)     Status: None  ?  Collection Time: 03/03/22  9:37 PM  ?Result Value Ref Range  ? Troponin I (High Sensitivity) 7 <18 ng/L  ?  Comment: (NOTE) ?Elevated high sensitivity troponin I (hsTnI) values and significant  ?changes across serial measurements may suggest ACS but many other  ?chronic and acute conditions are known to elevate hsTnI results.  ?Refer to the "Links" section for chest pain  algorithms and additional  ?guidance. ?Performed at [facility], [address] ?[place], [place] [address] ?  ? ?DG Chest 2 View ? ?Result Date: 03/03/2022 ?CLINICAL DATA:  Shortness of breath. EXAM: CHEST - 2 VIEW COMPARISON:  Chest x-ray 02/02/2022.  Chest CT 02/03/2022. FINDINGS: Multiple peripheral pulmonary masses are again seen throughout the right lung. These appear increased in size and number when compared to the prior examination. Right hilar enlargement has also increased from prior examination. Left pleural base nodular density is grossly unchanged. No pleural effusion or pneumothorax. Heart size within normal limits. No acute fractures. IMPRESSION: 1. Increasing size and number of peripheral right pulmonary masses. 2. Increasing soft tissue density in the right hilum. Electronically Signed   By: Ronney Asters M.D.   On: 03/03/2022 20:02  ? ?CT Head Wo Contrast ? ?Result Date: 03/03/2022 ?CLINICAL DATA:  History of melanoma with metastatic disease with new onset fatigue. EXAM: CT HEAD WITHOUT CONTRAST TECHNIQUE: Contiguous axial images were obtained from the base of the skull through the vertex without intravenous contrast. RADIATION DOSE REDUCTION: This exam was performed according to the departmental dose-optimization program which includes automated exposure control, adjustment of the mA and/or kV according to patient size and/or use of iterative reconstruction technique. COMPARISON:  MRI from 12/20/21 as well as 02/03/2022 FINDINGS: Brain: Multiple areas of increased attenuation are identified throughout both  cerebral hemispheres similar to that seen on recent MRI consistent with metastatic disease. Largest of these lies in the left frontal parietal region measuring up to 13 mm. Scattered smaller lesions are noted

## 2022-03-04 NOTE — Consult Note (Signed)
Referral MD ? ?Reason for Referral: Nausea and vomiting and poor appetite and metastatic melanoma ? ?Chief Complaint  ?Patient presents with  ? Shortness of Breath  ? Emesis  ?: I just do not want to eat. ? ?HPI: Ms. Peggy Howard is well-known to me.  She is a nice 88 year old white female.  She has metastatic melanoma.  She has a ungual melanoma which tend to be much more difficult to treat.  She had been on immunotherapy which she failed.  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  Unfortunate, she had a very transient response to this. ? ?We subsequently started her on nilotinib.  This was started about 3 weeks ago. ? ?She now comes in with not feeling well.  She is weak.  She is not eating well. ? ?Her labs did not look all that when she came in.  Her BUN was 20 creatinine 0.77.  Calcium was 8.6 with an albumin of 3.2. ? ?Her white cell count is 9.2.  Hemoglobin 12.3 and platelet count 438,000. ? ?She did have a CT of the brain.  This appeared to show enlarging CNS metastasis.  She has had radiation therapy to the brain. ? ?She had a chest x-ray which seemed to show some increasing hilar lesions. ? ?She does not have any pain.  There is some shortness of breath.  There is no pleural effusion. ? ?She looks better than I would have thought.  She is little bit hard of hearing. ? ?There is been no bleeding.  She says she is going to the bathroom.  She is not having diarrhea. ? ?Overall, I would say performance status is probably ECOG 2. ? ? ?Past Medical History:  ?Diagnosis Date  ? Arthritis   ? Goals of care, counseling/discussion 06/21/2021  ? Malignant pleural effusion 06/21/2021  ? Melanoma metastatic to brain [facility])   ? Melanoma metastatic to lung ([place]) 06/21/2021  ? Rectal prolapse   ?: ? ? ?Past Surgical History:  ?Procedure Laterality Date  ? ABDOMINAL HYSTERECTOMY    ? amputation of left finger     ? 2021  ? BIOPSY  03/27/2021  ? Procedure: BIOPSY;  Surgeon: Irving Copas., MD;  Location: [facility];  Service: Gastroenterology;;  ? COLONOSCOPY WITH PROPOFOL N/A 03/27/2021  ? Procedure: COLONOSCOPY WITH PROPOFOL;  Surgeon: Mansouraty, Telford Nab., MD;  Location: [facility];  Service: Gastroenterology;  Laterality: N/A;  ? JOINT REPLACEMENT    ? POLYPECTOMY  03/27/2021  ? Procedure: POLYPECTOMY;  Surgeon: Rush Landmark Telford Nab., MD;  Location: [facility];  Service: Gastroenterology;;  ? REMOVAL OF PLEURAL DRAINAGE CATHETER  09/02/2021  ? Procedure: REMOVAL OF PLEURAL DRAINAGE CATHETER;  Surgeon: Freddi Starr, MD;  Location: [facility];  Service: Pulmonary;;  ? REPLACEMENT TOTAL KNEE Bilateral   ? XI ROBOT ASSISTED RECTOPEXY N/A 06/12/2021  ? Procedure: XI ROBOT ASSISTED RECTOPEXY;  Surgeon: Leighton Ruff, MD;  Location: [facility];  Service: General;  Laterality: N/A;  ?: ? ? ?Current Facility-Administered Medications:  ?  0.9 %  sodium chloride infusion, , Intravenous, Continuous, Marcelyn Bruins, MD, Last Rate: 75 mL/hr at 03/04/22 0149, New Bag at 03/04/22 0149 ?  albuterol (PROVENTIL) (2.5 MG/3ML) 0.083% nebulizer solution 2.5 mg, 2.5 mg, Nebulization, Q6H PRN, Marcelyn Bruins, MD ?  dexamethasone (DECADRON) tablet 4 mg, 4 mg, Oral, Daily, Marcelyn Bruins, MD ?  dorzolamide-timolol (COSOPT) 22.3-6.8 MG/ML ophthalmic solution 1 drop, 1 drop, Both Eyes,  BID, Marcelyn Bruins, MD ?  dronabinol (MARINOL) capsule 2.5 mg, 2.5 mg, Oral, BID AC, Marcelyn Bruins, MD ?  famotidine (PEPCID) tablet 20 mg, 20 mg, Oral, Daily, Florene Glen, A Clint Lipps., MD ?  latanoprost (XALATAN) 0.005 % ophthalmic solution 1 drop, 1 drop, Both Eyes, QHS, Marcelyn Bruins, MD ?  meclizine (ANTIVERT) tablet 12.5 mg, 12.5 mg, Oral, TID PRN, Marcelyn Bruins, MD ?  sodium chloride flush (NS) 0.9 % injection 3 mL, 3 mL, Intravenous, Q12H, Marcelyn Bruins, MD, 3 mL at 03/04/22 0103 ?  trimethobenzamide (TIGAN) injection 200 mg, 200 mg, Intramuscular, Q6H PRN, Marcelyn Bruins, MD: ? ? dexamethasone  4 mg Oral Daily  ? dorzolamide-timolol  1 drop Both Eyes BID  ? dronabinol  2.5 mg Oral BID AC  ? famotidine  20 mg Oral Daily  ? latanoprost  1 drop Both Eyes QHS  ? sodium chloride flush  3 mL Intravenous Q12H  ?: ? ? ?Allergies  ?Allergen Reactions  ? Vitamin D Analogs Other (See Comments)  ?  "dizziness"  ?: ? ?History reviewed. No pertinent family history.: ? ? ?Social History  ? ?Socioeconomic History  ? Marital status: Widowed  ?  Spouse name: Not on file  ? Number of children: Not on file  ? Years of education: Not on file  ? Highest education level: Not on file  ?Occupational History  ? Not on file  ?Tobacco Use  ? Smoking status: Former  ?  Packs/day: 2.00  ?  Years: 25.00  ?  Pack years: 50.00  ?  Types: Cigarettes  ?  Quit date: 34  ?  Years since quitting: 46.3  ? Smokeless tobacco: Never  ?Vaping Use  ? Vaping Use: Never used  ?Substance and Sexual Activity  ? Alcohol use: No  ? Drug use: Yes  ?  Types: Benzodiazepines  ?  Comment: Denies any use. 06/21/21  ? Sexual activity: Never  ?Other Topics Concern  ? Not on file  ?Social History Narrative  ? Not on file  ? ?Social Determinants of Health  ? ?Financial Resource Strain: Not on file  ?Food Insecurity: Not on file  ?Transportation Needs: Not on file  ?Physical Activity: Not on file  ?Stress: Not on file  ?Social Connections: Not on file  ?Intimate Partner Violence: Not on file  ?: ? ?Review of Systems  ?Constitutional:  Positive for malaise/fatigue.  ?HENT: Negative.    ?Eyes: Negative.   ?Respiratory: Negative.    ?Cardiovascular: Negative.   ?Gastrointestinal:  Positive for nausea and vomiting.  ?Genitourinary: Negative.   ?Musculoskeletal: Negative.   ?Skin: Negative.   ?Neurological:  Positive for headaches.  ?Endo/Heme/Allergies: Negative.   ?Psychiatric/Behavioral: Negative.    ? ? ?Exam: ?Patient Vitals for the past 24 hrs: ? BP Temp Temp src Pulse Resp SpO2 Height Weight  ?03/04/22 0505 129/61 98.4 ?F (36.9 ?C) Oral  77 20 90 % -- --  ?03/04/22 0134 (!) 172/72 98.6 ?F (37 ?C) Oral 77 (!) 24 (!) 89 % -- --  ?03/04/22 0100 (!) 136/59 97.7 ?F (36.5 ?C) Oral 80 (!) 28 93 % -- --  ?03/04/22 0030 (!) 131/59 -- -- 79 (!) 25 92 % -- --  ?03/04/22 0000 140/66 -- -- 77 (!) 22 91 % -- --  ?03/03/22 2330 (!) 124/52 -- -- 73 (!) 27 91 % -- --  ?03/03/22 2130 136/67 -- -- 74 (!) 24 92 % -- --  ?03/03/22 1900 128/69 -- --  72 19 94 % -- --  ?03/03/22 1812 (!) 156/71 97.6 ?F (36.4 ?C) Oral 70 18 93 % $Re'5\' 5"'zdW$  (1.651 m) 151 lb (68.5 kg)  ? ?Physical Exam ?Vitals reviewed.  ?HENT:  ?   Head: Normocephalic and atraumatic.  ?Eyes:  ?   Pupils: Pupils are equal, round, and reactive to light.  ?Cardiovascular:  ?   Rate and Rhythm: Normal rate and regular rhythm.  ?   Heart sounds: Normal heart sounds.  ?Pulmonary:  ?   Effort: Pulmonary effort is normal.  ?   Breath sounds: Normal breath sounds.  ?Abdominal:  ?   General: Bowel sounds are normal.  ?   Palpations: Abdomen is soft.  ?Musculoskeletal:     ?   General: No tenderness or deformity. Normal range of motion.  ?   Cervical back: Normal range of motion.  ?Lymphadenopathy:  ?   Cervical: No cervical adenopathy.  ?Skin: ?   General: Skin is warm and dry.  ?   Findings: No erythema or rash.  ?Neurological:  ?   Mental Status: She is alert and oriented to person, place, and time.  ?Psychiatric:     ?   Behavior: Behavior normal.     ?   Thought Content: Thought content normal.     ?   Judgment: Judgment normal.  ? ? ?Recent Labs  ?  03/03/22 ?1839 03/04/22 ?4920  ?WBC 9.2 7.9  ?HGB 12.3 10.9*  ?HCT 37.3 34.1*  ?PLT 438* 401*  ? ? ?Recent Labs  ?  03/03/22 ?1839 03/04/22 ?1007  ?NA 135 136  ?K 4.0 4.2  ?CL 97* 100  ?CO2 28 25  ?GLUCOSE 148* 107*  ?BUN 22 16  ?CREATININE 0.77 0.65  ?CALCIUM 8.6* 8.1*  ? ? ?Blood smear review: None ? ?Pathology: None ? ? ? ?Assessment and Plan: Ms. Peggy Howard is a very nice 88 year old white female.  She had metastatic melanoma.  We did start her on a new medication about 3  weeks ago. ? ?It is hard to say if this really is working or not. ? ?I worry about the CNS metastasis.  I think an MRI of the brain is going be necessary.  I know she has had radiation therapy to the b

## 2022-03-04 NOTE — Assessment & Plan Note (Addendum)
Symptomatic management ?

## 2022-03-05 ENCOUNTER — Encounter: Payer: Self-pay | Admitting: Hematology & Oncology

## 2022-03-05 ENCOUNTER — Ambulatory Visit: Payer: TRICARE For Life (TFL) | Admitting: Radiation Oncology

## 2022-03-05 ENCOUNTER — Other Ambulatory Visit: Payer: Self-pay | Admitting: Radiation Therapy

## 2022-03-05 DIAGNOSIS — Z515 Encounter for palliative care: Secondary | ICD-10-CM | POA: Diagnosis not present

## 2022-03-05 DIAGNOSIS — Z7189 Other specified counseling: Secondary | ICD-10-CM

## 2022-03-05 MED ORDER — ALUM & MAG HYDROXIDE-SIMETH 200-200-20 MG/5ML PO SUSP: 30.0000 mL | Freq: Four times a day (QID) | ORAL | Status: DC | PRN

## 2022-03-05 MED ORDER — ALBUTEROL SULFATE (2.5 MG/3ML) 0.083% IN NEBU
2.5000 mg | INHALATION_SOLUTION | RESPIRATORY_TRACT | Status: DC | PRN
  Administered 2022-03-05 – 2022-03-06 (×2): 2.5 mg via RESPIRATORY_TRACT
  Filled 2022-03-05 (×2): qty 3

## 2022-03-05 MED ORDER — ACETAMINOPHEN 325 MG PO TABS: 650.0000 mg | ORAL_TABLET | Freq: Four times a day (QID) | ORAL | Status: DC | PRN

## 2022-03-05 MED ORDER — LORAZEPAM 2 MG/ML PO CONC: 1.0000 mg | ORAL | Status: DC | PRN

## 2022-03-05 MED ORDER — OXYCODONE HCL 5 MG PO TABS: 5.0000 mg | ORAL_TABLET | ORAL | Status: DC | PRN

## 2022-03-05 MED ORDER — FAMOTIDINE 20 MG PO TABS
40.0000 mg | ORAL_TABLET | Freq: Two times a day (BID) | ORAL | Status: DC
  Administered 2022-03-05 – 2022-03-06 (×3): 40 mg via ORAL
  Filled 2022-03-05 (×3): qty 2

## 2022-03-05 MED ORDER — LORAZEPAM 1 MG PO TABS: 1.0000 mg | ORAL_TABLET | ORAL | Status: DC | PRN

## 2022-03-05 MED ORDER — LORAZEPAM 2 MG/ML IJ SOLN: 1.0000 mg | INTRAMUSCULAR | Status: DC | PRN

## 2022-03-05 MED ORDER — MAGIC MOUTHWASH
15.0000 mL | Freq: Four times a day (QID) | ORAL | Status: DC | PRN
  Filled 2022-03-05: qty 15

## 2022-03-05 MED ORDER — HALOPERIDOL LACTATE 5 MG/ML IJ SOLN: 0.5000 mg | INTRAMUSCULAR | Status: DC | PRN

## 2022-03-05 MED ORDER — LEVETIRACETAM 250 MG PO TABS
250.0000 mg | ORAL_TABLET | Freq: Two times a day (BID) | ORAL | Status: DC
  Administered 2022-03-05 – 2022-03-06 (×3): 250 mg via ORAL
  Filled 2022-03-05 (×3): qty 1

## 2022-03-05 MED ORDER — HALOPERIDOL LACTATE 2 MG/ML PO CONC
0.5000 mg | ORAL | Status: DC | PRN
  Filled 2022-03-05: qty 0.3

## 2022-03-05 MED ORDER — POLYVINYL ALCOHOL 1.4 % OP SOLN
1.0000 [drp] | Freq: Four times a day (QID) | OPHTHALMIC | Status: DC | PRN
  Filled 2022-03-05: qty 15

## 2022-03-05 MED ORDER — ACETAMINOPHEN 650 MG RE SUPP: 650.0000 mg | Freq: Four times a day (QID) | RECTAL | Status: DC | PRN

## 2022-03-05 MED ORDER — HALOPERIDOL 0.5 MG PO TABS: 0.5000 mg | ORAL_TABLET | ORAL | Status: DC | PRN

## 2022-03-05 MED ORDER — SENNA 8.6 MG PO TABS: 1.0000 | ORAL_TABLET | Freq: Every evening | ORAL | Status: DC | PRN

## 2022-03-05 MED ORDER — BACLOFEN 10 MG PO TABS: 5.0000 mg | ORAL_TABLET | Freq: Two times a day (BID) | ORAL | Status: DC | PRN

## 2022-03-05 MED ORDER — DEXAMETHASONE 4 MG PO TABS
4.0000 mg | ORAL_TABLET | Freq: Three times a day (TID) | ORAL | Status: DC
  Administered 2022-03-05 – 2022-03-06 (×4): 4 mg via ORAL
  Filled 2022-03-05 (×4): qty 1

## 2022-03-05 MED ORDER — MORPHINE SULFATE (PF) 2 MG/ML IV SOLN: 1.0000 mg | INTRAVENOUS | Status: DC | PRN

## 2022-03-05 MED ORDER — OXYBUTYNIN CHLORIDE 5 MG PO TABS: 2.5000 mg | ORAL_TABLET | Freq: Four times a day (QID) | ORAL | Status: DC | PRN

## 2022-03-05 MED ORDER — DIPHENHYDRAMINE HCL 50 MG/ML IJ SOLN: 12.5000 mg | INTRAMUSCULAR | Status: DC | PRN

## 2022-03-05 NOTE — TOC Initial Note (Signed)
Transition of Care (TOC) - Initial/Assessment Note  ? ? ?Patient Details  ?Name: Peggy Howard ?MRN: [id number] ?Date of Birth: 12/13/1932 ? ?Transition of Care (TOC) CM/SW Contact:    ?Marleni Gallardo, Marjie Skiff, RN ?Phone Number: ?03/05/2022, 10:35 AM ? ?Clinical Narrative:                 ?TOC consult for home hospice services. Spoke with pt at bedside who defers planning to her son Peggy Howard. Spoke with Peggy Howard via phone. Choice offered for home hospice services and [facility]. RW and 3in1 requested for home. [facility] liaison contacted for referral. DME needs also relayed. TOC will follow and assist with dc as needed. ? ?Expected Discharge Plan: [place] ?Barriers to Discharge: No Barriers Identified ? ? ?Patient Goals and CMS Choice ?Patient states their goals for this hospitalization and ongoing recovery are:: To go home ?CMS Medicare.gov Compare Post Acute Care list provided to:: Patient Represenative (must comment) (Son Peggy Howard) ?Choice offered to / list presented to : Adult Children ? ?Expected Discharge Plan and Services ?Expected Discharge Plan: [place] ?  ?Discharge Planning Services: CM Consult ?Post Acute Care Choice: Hospice ?Living arrangements for the past 2 months: [place] ?                ?  ?  ?  ?  ?  ?HH Arranged: Disease Management ?[place] Agency: [facility] ?Date HH Agency Contacted: 03/05/22 ?Time [place]: [id number] ?Representative spoke with at [place]: [place] ? ?Prior Living Arrangements/Services ?Living arrangements for the past 2 months: [place] ?Lives with:: Adult Children ?Patient language and need for interpreter reviewed:: Yes ?Do you feel safe going back to the place where you live?: Yes      ?Need for Family Participation in Patient Care: Yes (Comment) ?Care giver support system in place?: Yes (comment) ?  ?Criminal Activity/Legal Involvement Pertinent to Current Situation/Hospitalization: No - Comment as  needed ? ?Activities of Daily Living ?Home Assistive Devices/Equipment: None ?ADL Screening (condition at time of admission) ?Patient's cognitive ability adequate to safely complete daily activities?: No ?Is the patient deaf or have difficulty hearing?: Yes ?Does the patient have difficulty seeing, even when wearing glasses/contacts?: No ?Does the patient have difficulty concentrating, remembering, or making decisions?: Yes ?Patient able to express need for assistance with ADLs?: Yes ?Does the patient have difficulty dressing or bathing?: No ?Independently performs ADLs?: Yes (appropriate for developmental age) ?Does the patient have difficulty walking or climbing stairs?: No ?Weakness of Legs: None ?Weakness of Arms/Hands: None ? ?Permission Sought/Granted ?Permission sought to share information with : Customer service manager ?Permission granted to share information with : Yes, Verbal Permission Granted ?   ? Permission granted to share info w AGENCY: [place] ?   ?   ? ?Emotional Assessment ?Appearance:: Appears stated age ?Attitude/Demeanor/Rapport: Gracious ?Affect (typically observed): Calm ?Orientation: : Oriented to Self, Oriented to Place, Oriented to  Time, Oriented to Situation ?Alcohol / Substance Use: Not Applicable ?Psych Involvement: No (comment) ? ?Admission diagnosis:  Admission for palliative care [Z51.5] ?Thrombocytosis [D75.839] ?Failure to thrive in adult [R62.7] ?Metastatic melanoma ([place]) [C43.9] ?Patient Active Problem List  ? Diagnosis Date Noted  ? Metastatic melanoma ([place]) 03/04/2022  ? Malaise 03/04/2022  ? Admission for palliative care 03/03/2022  ? Dizziness 02/03/2022  ? Hyperkalemia 02/03/2022  ? Macrocytic anemia 02/03/2022  ? Vasogenic edema ([place]) 02/03/2022  ?  Nausea vomiting and diarrhea 02/02/2022  ? UTI (urinary tract infection) 02/02/2022  ? Malignant neoplasm metastatic to brain [facility]) 09/06/2021  ? Chronic respiratory failure with hypoxia ([place]) 08/23/2021   ? Cellulitis of chest wall 07/12/2021  ? Chest tube in place 07/12/2021  ? Pleurx chath 06/29/2021  ? Acute respiratory failure ([place]) 06/27/2021  ? Melanoma metastatic to lung ([place]) 06/21/2021  ? Malignant pleural effusion 06/21/2021  ? Goals of care, counseling/discussion 06/21/2021  ? Rectal prolapse 06/12/2021  ? GIB (gastrointestinal bleeding) 03/26/2021  ? BRBPR (bright red blood per rectum) 03/25/2021  ? Abdominal pain 03/25/2021  ? Low back pain 03/25/2021  ? Overweight (BMI 25.0-29.9) 07/16/2017  ? Colonoscopy refused 07/16/2017  ? Refuses treatment 07/16/2017  ? Parent refuses immunizations 07/16/2017  ? Patient refuses to take medication 07/16/2017  ? History of cigarette smoking 06/26/2017  ? Other fatigue 06/26/2017  ? Memory changes 06/26/2017  ? Community acquired pneumonia of right middle lobe of lung   ? External hemorrhoids 01/26/2008  ? ?PCP:  Pcp, No ?Pharmacy:   ?[facility], [place] ?[place] ?[place] [address] ?Phone: [phone] Fax: [phone] ? ?[facility], [place] ?[address] ?[place] [address] ?Phone: [phone] Fax: [phone] ? ?[facility] ?[address] [place] [address] ?Phone: [phone] Fax: [phone] ? ? ? ? ?Social Determinants of Health (SDOH) Interventions ?  ? ?Readmission Risk Interventions ? ?  03/05/2022  ? 10:32 AM  ?Readmission Risk Prevention Plan  ?Transportation Screening Complete  ?PCP or Specialist Appt within 3-5 Days Complete  ?[place] or Home Care Consult Complete  ?Social Work Consult for [place] Planning/Counseling Complete  ?Palliative Care Screening Complete  ?Medication Review Press photographer) Complete  ? ? ? ?

## 2022-03-05 NOTE — Progress Notes (Signed)
? ?  This pt was referred to hospice care at home. I have been able to connect with the family. Spoke to son [name] and explained the services and discussed goals of care. They are in agreement with hospice services at d/c. He does agree to some equipment to be delivered to home. I have ordered a trasnport chair, rolling walker, shower chair, and 3N1 to be delivered. He will take her home by car when ready for d/c.  ? ?I have reviewed chart with our MD- She approves pt for hospice eligibility.  ? ?Webb Silversmith RN [phone]  ?

## 2022-03-05 NOTE — Assessment & Plan Note (Signed)
Supportive care. 

## 2022-03-05 NOTE — Assessment & Plan Note (Signed)
Secondary to underlying malignancy ?

## 2022-03-05 NOTE — Progress Notes (Signed)
?PROGRESS NOTE ? ? ? ?Peggy Howard  QHU:[id number] DOB: Jul 31, 1933 DOA: 03/03/2022 ?PCP: Pcp, No  ? ?Brief Narrative:  ?88 y.o. 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  Currently undergoing workup with Dr. Marin Olp including CT CAP (showing progression of pleural and pulm metastatic disease, progression of mediastinal LAD, new or enhancing lesion in R lower chest wall mainly involving the serratus anterior, and enlarging subcutaneous metastasis).  MRI brain here shows progression of metastatic disease.  After discussion with oncology and the patient, she will be transition to hospice. ? ? ?Assessment & Plan: ? Principal Problem: ?  Admission for palliative care ?Active Problems: ?  Nausea vomiting and diarrhea ?  Metastatic melanoma ([place]) ?  Malignant neoplasm metastatic to brain [facility]) ?  Melanoma metastatic to lung [facility]) ?  Malaise ?  Malignant pleural effusion ?  ? ? ?Assessment and Plan: ?* Admission for palliative care ?Patient will be transition to home hospice ? ?Metastatic melanoma ([place]) ?Unfortunately patient has had progression to brain mets despite of previous therapy.  Seen by oncology.  Recommend transitioning patient to hospice, patient is agreeable.  Anticipate to discharge home on 4/13 per Dr. Marin Olp.  In the meantime I will consult social services to help make arrangements ? ?Nausea vomiting and diarrhea ?Symptomatic management ? ?Malignant neoplasm metastatic to brain [facility]) ?Metastatic melanoma to brain.  No further options at this time.  Seen by oncology.  Will transition to hospice ? ?Malaise ?Secondary to underlying malignancy ? ?Melanoma metastatic to lung [facility]) ?Supportive care ? ?Malignant pleural effusion ?Supportive care ? ? ? ? ?  ? ? ? ?DVT prophylaxis: None, patient is being  transitioned to hospice ?Code Status: DNR ?Family Communication:   ? ?Status is: Inpatient ?Remains inpatient appropriate because: Plan is to start transitioning patient to hospice, will alert TOC team to start making arrangements.  Anticipate discharge tomorrow per oncology ? ? ?Nutritional status ? ? ? ?  ? ?  ? ?Body mass index is 25.13 kg/m?. ? ?  ? ? ? ? ? ?Subjective: ? ?Patient seen and examined at bedside, does not any complaints.  She agrees to be transition to hospice care. ? ?Examination: ? ?General exam: Appears calm , generally ill and frail appearing. ?Respiratory system: Clear to auscultation. Respiratory effort normal. ?Cardiovascular system: S1 & S2 heard, RRR. No JVD, murmurs, rubs, gallops or clicks. No pedal edema. ?Gastrointestinal system: Abdomen is nondistended, soft and nontender. No organomegaly or masses felt. Normal bowel sounds heard. ?Central nervous system: Alert and oriented. No focal neurological deficits. ?Extremities: Symmetric 5 x 5 power. ?Skin: No rashes, lesions or ulcers ?Psychiatry: Judgement and insight appear normal. Mood & affect appropriate.  ? ? ? ?Objective: ?Vitals:  ? 03/04/22 1333 03/04/22 1955 03/04/22 2229 03/05/22 0430  ?BP: 137/70 131/63  (!) 110/49  ?Pulse: 84 84  77  ?Resp: 16 16  18   ?Temp: 98.8 ?F (37.1 ?C) 98.2 ?F (36.8 ?C)  97.6 ?F (36.4 ?C)  ?TempSrc: Oral Oral  Oral  ?SpO2: 90% 90% 91% 93%  ?Weight:      ?Height:      ? ? ?Intake/Output Summary (Last 24 hours) at 03/05/2022 0833 ?Last data filed at 03/04/2022 1805 ?Gross per 24 hour  ?Intake 120 ml  ?Output --  ?Net 120 ml  ? ?Filed Weights  ? 03/03/22 1812  ?  Weight: 68.5 kg  ? ? ? ?Data Reviewed:  ? ?CBC: ?Recent Labs  ?Lab 03/03/22 ?1839 03/04/22 ?4627  ?WBC 9.2 7.9  ?NEUTROABS 7.4  --   ?HGB 12.3 10.9*  ?HCT 37.3 34.1*  ?MCV 99.7 100.6*  ?PLT 438* 401*  ? ?Basic Metabolic Panel: ?Recent Labs  ?Lab 03/03/22 ?1839 03/04/22 ?0350  ?NA 135 136  ?K 4.0 4.2  ?CL 97* 100  ?CO2 28 25  ?GLUCOSE 148* 107*  ?BUN 22  16  ?CREATININE 0.77 0.65  ?CALCIUM 8.6* 8.1*  ? ?GFR: ?Estimated Creatinine Clearance: 49.1 mL/min (by C-G formula based on SCr of 0.65 mg/dL). ?Liver Function Tests: ?Recent Labs  ?Lab 03/03/22 ?1839 03/04/22 ?0938  ?AST 53* 41  ?ALT 36 29  ?ALKPHOS 69 57  ?BILITOT 1.6* 1.4*  ?PROT 7.0 5.8*  ?ALBUMIN 3.2* 2.8*  ? ?No results for input(s): LIPASE, AMYLASE in the last 168 hours. ?No results for input(s): AMMONIA in the last 168 hours. ?Coagulation Profile: ?No results for input(s): INR, PROTIME in the last 168 hours. ?Cardiac Enzymes: ?No results for input(s): CKTOTAL, CKMB, CKMBINDEX, TROPONINI in the last 168 hours. ?BNP (last 3 results) ?No results for input(s): PROBNP in the last 8760 hours. ?HbA1C: ?No results for input(s): HGBA1C in the last 72 hours. ?CBG: ?No results for input(s): GLUCAP in the last 168 hours. ?Lipid Profile: ?No results for input(s): CHOL, HDL, LDLCALC, TRIG, CHOLHDL, LDLDIRECT in the last 72 hours. ?Thyroid Function Tests: ?No results for input(s): TSH, T4TOTAL, FREET4, T3FREE, THYROIDAB in the last 72 hours. ?Anemia Panel: ?No results for input(s): VITAMINB12, FOLATE, FERRITIN, TIBC, IRON, RETICCTPCT in the last 72 hours. ?Sepsis Labs: ?No results for input(s): PROCALCITON, LATICACIDVEN in the last 168 hours. ? ?No results found for this or any previous visit (from the past 240 hour(s)).  ? ? ? ? ? ?Radiology Studies: ?DG Chest 2 View ? ?Result Date: 03/03/2022 ?CLINICAL DATA:  Shortness of breath. EXAM: CHEST - 2 VIEW COMPARISON:  Chest x-ray 02/02/2022.  Chest CT 02/03/2022. FINDINGS: Multiple peripheral pulmonary masses are again seen throughout the right lung. These appear increased in size and number when compared to the prior examination. Right hilar enlargement has also increased from prior examination. Left pleural base nodular density is grossly unchanged. No pleural effusion or pneumothorax. Heart size within normal limits. No acute fractures. IMPRESSION: 1. Increasing size and  number of peripheral right pulmonary masses. 2. Increasing soft tissue density in the right hilum. Electronically Signed   By: Ronney Asters M.D.   On: 03/03/2022 20:02  ? ?CT Head Wo Contrast ? ?Result Date: 03/03/2022 ?CLINICAL DATA:  History of melanoma with metastatic disease with new onset fatigue. EXAM: CT HEAD WITHOUT CONTRAST TECHNIQUE: Contiguous axial images were obtained from the base of the skull through the vertex without intravenous contrast. RADIATION DOSE REDUCTION: This exam was performed according to the departmental dose-optimization program which includes automated exposure control, adjustment of the mA and/or kV according to patient size and/or use of iterative reconstruction technique. COMPARISON:  MRI from 12/20/21 as well as 02/03/2022 FINDINGS: Brain: Multiple areas of increased attenuation are identified throughout both cerebral hemispheres similar to that seen on recent MRI consistent with metastatic disease. Largest of these lies in the left frontal parietal region measuring up to 13 mm. Scattered smaller lesions are noted. Vascular: No hyperdense vessel or unexpected calcification. Skull: No acute abnormality noted. Benign lucency again identified in the parietal bone. Sinuses/Orbits: No acute finding. Other: None. IMPRESSION: Changes consistent with metastatic  disease throughout both cerebral hemispheres. The largest of these lesions is noted on the left as described measuring up to 13 mm. This is increased in size when compared with the recent MRI. The increased density may represent some focal interval hemorrhage. Follow-up with contrast enhanced MRI may be helpful. Electronically Signed   By: Inez Catalina M.D.   On: 03/03/2022 21:07  ? ?MR BRAIN W WO CONTRAST ? ?Result Date: 03/04/2022 ?CLINICAL DATA:  Metastatic melanoma. Multiple brain metastases. Previous radiation therapy. Assess for new or progressive disease. EXAM: MRI HEAD WITHOUT AND WITH CONTRAST TECHNIQUE: Multiplanar,  multiecho pulse sequences of the brain and surrounding structures were obtained without and with intravenous contrast. CONTRAST:  33mL GADAVIST GADOBUTROL 1 MMOL/ML IV SOLN COMPARISON:  02/03/2022.  12/20/2021.  10/14/2

## 2022-03-05 NOTE — Assessment & Plan Note (Signed)
Metastatic melanoma to brain.  No further options at this time.  Seen by oncology.  Will transition to hospice ?

## 2022-03-05 NOTE — Progress Notes (Signed)
Unfortunately, we clearly have progressive disease both in the brain and systemically.  She completed radiation therapy to the brain not even 2 months ago.  She now has multiple metastasis back.  Her systemic disease is also worse. ? ?I had a talk with her this morning.  I really think that she would benefit from Hospice.  She is in agreement.  She is very realistic about her prognosis.  She has been through multiple lines of therapy.  She has already had radiotherapy to the brain. ? ?We had to get her on something for for seizure prophylaxis.  She also needs to be on some Decadron.  This may help with her appetite. ? ?She would like to go home tomorrow.  I think this is very reasonable. ? ?I just hate this for her.  She is done everything that we have asked her to do.  I just feel that she has a melanoma that is quite aggressive and malignant.  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 ? ?She ate yesterday.  She did not have nausea or vomiting.  I did start her on some Megace. ? ?Yesterday, her albumin was only 2.8.  Her white cell count 7.9.  Hemoglobin 10.9.  Platelet count 41,000. ? ?On her physical exam, her vital signs were temperature 97.6.  Pulse 77.  Blood pressure 110/49.  Her HEENT exam shows no ocular or oral lesions.  She has no adenopathy in the neck.  Her lungs sound clear.  Cardiac exam regular rate and rhythm.  Abdomen is soft.  Bowel sounds are present.  There is no guarding or rebound tenderness.  Extremities shows no clubbing, cyanosis or edema.  She has decent range of motion of her joints.  Neurological exam shows no focal neurological deficits. ? ?Again, Ms. Coventry feels that Hospice would be best for her.  She would like to stay home.  She knows about Hospice as Hospice helped her husband. ? ?I will have to talk to one of her sons about this.  I had a talk with him yesterday before we  have the scan results back.  He is aware of the likely results.  He has talked to Ms. Divelbiss in he agrees that Hospice would be appropriate. ? ?Again, she would like to go home on Thursday.  Her house will be ready for her on Thursday.  I do not think this is unreasonable. ? ?Lattie Haw, MD ?2 Timothy 4:16 ?

## 2022-03-05 NOTE — Assessment & Plan Note (Signed)
Patient will be transition to home hospice ?

## 2022-03-06 ENCOUNTER — Encounter: Payer: Self-pay | Admitting: *Deleted

## 2022-03-06 DIAGNOSIS — Z515 Encounter for palliative care: Secondary | ICD-10-CM | POA: Diagnosis not present

## 2022-03-06 MED ORDER — LORAZEPAM 1 MG PO TABS
1.0000 mg | ORAL_TABLET | ORAL | 0 refills | Status: AC | PRN
Start: 2022-03-06 — End: ?

## 2022-03-06 MED ORDER — SENNA 8.6 MG PO TABS: 1.0000 | ORAL_TABLET | Freq: Every evening | ORAL | 0 refills | Status: AC | PRN

## 2022-03-06 MED ORDER — DEXAMETHASONE 4 MG PO TABS: 4.0000 mg | ORAL_TABLET | Freq: Three times a day (TID) | ORAL | 0 refills | Status: AC

## 2022-03-06 MED ORDER — LOPERAMIDE HCL 2 MG PO CAPS
2.0000 mg | ORAL_CAPSULE | ORAL | 0 refills | Status: AC | PRN
Start: 2022-03-06 — End: ?

## 2022-03-06 MED ORDER — OXYCODONE HCL 5 MG PO TABS: 5.0000 mg | ORAL_TABLET | ORAL | 0 refills | Status: AC | PRN

## 2022-03-06 MED ORDER — LEVETIRACETAM 250 MG PO TABS: 250.0000 mg | ORAL_TABLET | Freq: Two times a day (BID) | ORAL | 0 refills | Status: AC

## 2022-03-06 NOTE — Discharge Summary (Signed)
Physician Discharge Summary  ?Peggy Howard Arvie TKW:[id number] DOB: 1933/09/10 DOA: 03/03/2022 ? ?PCP: Pcp, No ? ?Admit date: 03/03/2022 ?Discharge date: 03/06/2022 ? ?Admitted From: Home ?Disposition: Home with hospice ? ?Recommendations for Outpatient Follow-up:  ?Follow up with PCP in 1-2 weeks ?Please obtain BMP/CBC in one week your next doctors visit.  ? ? ? ?Discharge Condition: Stable ?CODE STATUS: DNR ?Diet recommendation: Regular ? ?Brief/Interim Summary: ? ? ? ?Assessment and Plan: ?* Admission for palliative care ?Patient will be transition to home hospice ? ?Metastatic melanoma ([place]) ?Unfortunately patient has had progression to brain mets despite of previous therapy.  Seen by oncology.  Recommend transitioning patient to hospice, patient is agreeable.  ? ?Nausea vomiting and diarrhea ?Symptomatic management ? ?Malignant neoplasm metastatic to brain [facility]) ?Metastatic melanoma to brain.  No further options at this time.  Seen by oncology.  Will transition to hospice ? ?Malaise ?Secondary to underlying malignancy ? 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 ?Malignant pleural effusion ?Supportive care ? ? ? ? ?  ?Body mass index is 25.13 kg/m?. ? ?  ? ? ? ?Discharge Diagnoses:  ?Principal Problem: ?  Admission for palliative care ?Active Problems: ?  Nausea vomiting and diarrhea ?  Metastatic melanoma ([place]) ?  Malignant neoplasm metastatic to brain [facility]) ?  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  Malaise ?  Malignant pleural effusion ? ? ? ? ? ?Consultations: ?Oncology ?Palliative care ? ?Subjective: ?Patient has any complaints, she was resting comfortably this morning.  No questions this morning. ? ?Discharge Exam: ?Vitals:  ? 03/06/22 0411 03/06/22 0728  ?BP: 131/66   ?Pulse: 67   ?Resp: 18   ?Temp: (!) 97.4 ?F (36.3 ?C)   ?SpO2: 92% 91%  ? ?Vitals:  ? 03/05/22 0430 03/05/22 1951 03/06/22 0411 03/06/22 0728  ?BP: (!) 110/49 (!) 140/58 131/66   ?Pulse: 77 77 67   ?Resp: 18 20 18    ?Temp: 97.6 ?F  (36.4 ?C) (!) 97.3 ?F (36.3 ?C) (!) 97.4 ?F (36.3 ?C)   ?TempSrc: Oral Oral Oral   ?SpO2: 93% 93% 92% 91%  ?Weight:      ?Height:      ? ? ?General: Pt is alert, awake, not in acute distress, elderly frail chronically ill-appearing ?Cardiovascular: RRR, S1/S2 +, no rubs, no gallops ?Respiratory: CTA bilaterally, no wheezing, no rhonchi ?Abdominal: Soft, NT, ND, bowel sounds + ?Extremities: no edema, no cyanosis ? ?Discharge Instructions ? ? ?Allergies as of 03/06/2022   ? ?   Reactions  ? Vitamin D Analogs Other (See Comments)  ? "dizziness"  ? ?  ? ?  ?Medication List  ?  ? ?STOP taking these medications   ? ?albuterol 108 (90 Base) MCG/ACT inhaler ?Commonly known as: VENTOLIN HFA ?  ?dorzolamide-timolol 22.3-6.8 MG/ML ophthalmic solution ?Commonly known as: COSOPT ?  ?dronabinol 2.5 MG capsule ?Commonly known as: MARINOL ?  ?famotidine 20 MG tablet ?Commonly known as: PEPCID ?  ?fluticasone-salmeterol 250-50 MCG/ACT Aepb ?Commonly known as: Advair Diskus ?  ?latanoprost 0.005 % ophthalmic solution ?Commonly known as: XALATAN ?  ?levothyroxine 50 MCG tablet ?Commonly known as: SYNTHROID ?  ?meclizine 12.5 MG tablet ?Commonly known as: ANTIVERT ?  ?meclizine 25 MG tablet ?Commonly known as: ANTIVERT ?  ?Tasigna 150 MG capsule ?Generic drug: nilotinib ?  ? ?  ? ?TAKE these medications   ? ?dexamethasone 4 MG tablet ?Commonly known as: DECADRON ?Take 1 tablet (4 mg total) by mouth every 8 (eight) hours. ?What changed: See  the new instructions. ?  ?levETIRAcetam 250 MG tablet ?Commonly known as: KEPPRA ?Take 1 tablet (250 mg total) by mouth 2 (two) times daily. ?  ?loperamide 2 MG capsule ?Commonly known as: IMODIUM ?Take 1 capsule (2 mg total) by mouth as needed for diarrhea or loose stools. ?  ?LORazepam 1 MG tablet ?Commonly known as: ATIVAN ?Take 1 tablet (1 mg total) by mouth every 4 (four) hours as needed for anxiety or seizure. ?  ?oxyCODONE 5 MG immediate release tablet ?Commonly known as: Oxy  IR/ROXICODONE ?Take 1 tablet (5 mg total) by mouth every 4 (four) hours as needed for moderate pain or breakthrough pain (or dyspnea). ?  ?senna 8.6 MG Tabs tablet ?Commonly known as: SENOKOT ?Take 1 tablet (8.6 mg total) by mouth at bedtime as needed for mild constipation. ?  ? ?  ? ? ?Allergies  ?Allergen Reactions  ? Vitamin D Analogs Other (See Comments)  ?  "dizziness"  ? ? ?You were cared for by a hospitalist during your hospital stay. If you have any questions about your discharge medications or the care you received while you were in the hospital after you are discharged, you can call the unit and asked to speak with the hospitalist on call if the hospitalist that took care of you is not available. Once you are discharged, your primary care physician will handle any further medical issues. Please note that no refills for any discharge medications will be authorized once you are discharged, as it is imperative that you return to your primary care physician (or establish a relationship with a primary care physician if you do not have one) for your aftercare needs so that they can reassess your need for medications and monitor your lab values. ? ? ?Procedures/Studies: ?DG Chest 2 View ? ?Result Date: 03/03/2022 ?CLINICAL DATA:  Shortness of breath. EXAM: CHEST - 2 VIEW COMPARISON:  Chest x-ray 02/02/2022.  Chest CT 02/03/2022. FINDINGS: Multiple peripheral pulmonary masses are again seen throughout the right lung. These appear increased in size and number when compared to the prior examination. Right hilar enlargement has also increased from prior examination. Left pleural base nodular density is grossly unchanged. No pleural effusion or pneumothorax. Heart size within normal limits. No acute fractures. IMPRESSION: 1. Increasing size and number of peripheral right pulmonary masses. 2. Increasing soft tissue density in the right hilum. Electronically Signed   By: Ronney Asters M.D.   On: 03/03/2022 20:02  ? ?CT  Head Wo Contrast ? ?Result Date: 03/03/2022 ?CLINICAL DATA:  History of melanoma with metastatic disease with new onset fatigue. EXAM: CT HEAD WITHOUT CONTRAST TECHNIQUE: Contiguous axial images were obtained from the base of the skull through the vertex without intravenous contrast. RADIATION DOSE REDUCTION: This exam was performed according to the departmental dose-optimization program which includes automated exposure control, adjustment of the mA and/or kV according to patient size and/or use of iterative reconstruction technique. COMPARISON:  MRI from 12/20/21 as well as 02/03/2022 FINDINGS: Brain: Multiple areas of increased attenuation are identified throughout both cerebral hemispheres similar to that seen on recent MRI consistent with metastatic disease. Largest of these lies in the left frontal parietal region measuring up to 13 mm. Scattered smaller lesions are noted. Vascular: No hyperdense vessel or unexpected calcification. Skull: No acute abnormality noted. Benign lucency again identified in the parietal bone. Sinuses/Orbits: No acute finding. Other: None. IMPRESSION: Changes consistent with metastatic disease throughout both cerebral hemispheres. The largest of these lesions is noted on the  left as described measuring up to 13 mm. This is increased in size when compared with the recent MRI. The increased density may represent some focal interval hemorrhage. Follow-up with contrast enhanced MRI may be helpful. Electronically Signed   By: Inez Catalina M.D.   On: 03/03/2022 21:07  ? ?MR BRAIN W WO CONTRAST ? ?Result Date: 03/04/2022 ?CLINICAL DATA:  Metastatic melanoma. Multiple brain metastases. Previous radiation therapy. Assess for new or progressive disease. EXAM: MRI HEAD WITHOUT AND WITH CONTRAST TECHNIQUE: Multiplanar, multiecho pulse sequences of the brain and surrounding structures were obtained without and with intravenous contrast. CONTRAST:  67mL GADAVIST GADOBUTROL 1 MMOL/ML IV SOLN  COMPARISON:  02/03/2022.  12/20/2021.  09/06/2021. FINDINGS: Brain: No metastatic lesion seen affecting the brainstem or cerebellum. Multiple metastatic lesions in the right hemisphere. Enlarging lesions are marked by double arrows

## 2022-03-06 NOTE — Progress Notes (Signed)
? ? ? ?  Referral received for Peggy Howard for goals of care discussion. Chart reviewed and it appears the patinet has already elected home hospice. [facility] has seen the patient and arranged DME deliver tomorrow. Anticipate d/c tomorrow. Goals appear clear. ? ?Discussed with Dr. Reesa Chew who agrees goals are clear, ok for palliative to sign off.   ? ?Thank you for your referral and allowing [facility] to assist in [place] care.  ? ?Walden Field, NP ?Palliative Medicine Team ?Phone: [phone] ? ?NO CHARGE  ? ?

## 2022-03-06 NOTE — Progress Notes (Signed)
Patient with recent hospitalization with discharge to home with hospice.  ? ?Oncology Nurse Navigator Documentation ? ? ?  03/06/2022  ?  3:00 PM  ?Oncology Nurse Navigator Flowsheets  ?Navigation Complete Date: 03/06/2022  ?Post Navigation: Continue to Follow Patient? No  ?Reason Not Navigating Patient: Hospice/Death  ?Navigator Location [facility]  ?Navigator Encounter Type Appt/Treatment Plan Review  ?Time Spent with Patient 15  ?  ?

## 2022-03-06 NOTE — Progress Notes (Signed)
? ?  I have spoken to the pt's son this morning. His equipment ws delivered to home yesterday afternoon. He is on his way at this time to hospital for pt to d/c home if MD feels she is ready. We have plans to meet in home this afternoon if d/c and enroll pt into hospice services. ? ?Webb Silversmith RN [phone]  ?

## 2022-03-07 ENCOUNTER — Inpatient Hospital Stay: Payer: Medicare Other | Admitting: Hematology & Oncology

## 2022-03-07 ENCOUNTER — Inpatient Hospital Stay: Payer: Medicare Other

## 2022-03-24 DEATH — deceased

## 2023-10-21 ENCOUNTER — Other Ambulatory Visit: Payer: Self-pay

## 2024-08-22 ENCOUNTER — Other Ambulatory Visit: Payer: Self-pay
